# Patient Record
Sex: Female | Born: 1945
Health system: Southern US, Community
[De-identification: ages and names within clinical notes are randomized; demographics above are authoritative.]

## PROBLEM LIST (undated history)

## (undated) DIAGNOSIS — T7840XA Allergy, unspecified, initial encounter: Secondary | ICD-10-CM

## (undated) DIAGNOSIS — C4490 Unspecified malignant neoplasm of skin, unspecified: Secondary | ICD-10-CM

## (undated) DIAGNOSIS — K219 Gastro-esophageal reflux disease without esophagitis: Secondary | ICD-10-CM

## (undated) DIAGNOSIS — J449 Chronic obstructive pulmonary disease, unspecified: Secondary | ICD-10-CM

## (undated) DIAGNOSIS — K5792 Diverticulitis of intestine, part unspecified, without perforation or abscess without bleeding: Secondary | ICD-10-CM

## (undated) DIAGNOSIS — F419 Anxiety disorder, unspecified: Secondary | ICD-10-CM

## (undated) DIAGNOSIS — E785 Hyperlipidemia, unspecified: Secondary | ICD-10-CM

## (undated) DIAGNOSIS — F32A Depression, unspecified: Secondary | ICD-10-CM

## (undated) DIAGNOSIS — R5383 Other fatigue: Secondary | ICD-10-CM

## (undated) DIAGNOSIS — M545 Low back pain, unspecified: Secondary | ICD-10-CM

## (undated) DIAGNOSIS — R0600 Dyspnea, unspecified: Secondary | ICD-10-CM

## (undated) DIAGNOSIS — G4700 Insomnia, unspecified: Secondary | ICD-10-CM

## (undated) DIAGNOSIS — M1990 Unspecified osteoarthritis, unspecified site: Secondary | ICD-10-CM

## (undated) DIAGNOSIS — F329 Major depressive disorder, single episode, unspecified: Secondary | ICD-10-CM

## (undated) HISTORY — DX: Major depressive disorder, single episode, unspecified: F32.9

## (undated) HISTORY — DX: Allergy, unspecified, initial encounter: T78.40XA

## (undated) HISTORY — DX: Chronic obstructive pulmonary disease, unspecified: J44.9

## (undated) HISTORY — DX: Unspecified osteoarthritis, unspecified site: M19.90

## (undated) HISTORY — DX: Diverticulitis of intestine, part unspecified, without perforation or abscess without bleeding: K57.92

## (undated) HISTORY — DX: Unspecified malignant neoplasm of skin, unspecified: C44.90

## (undated) HISTORY — DX: Hyperlipidemia, unspecified: E78.5

## (undated) HISTORY — DX: Low back pain, unspecified: M54.50

## (undated) HISTORY — PX: SKIN CANCER EXCISION: SHX779

## (undated) HISTORY — DX: Other fatigue: R53.83

## (undated) HISTORY — DX: Low back pain: M54.5

## (undated) HISTORY — DX: Dyspnea, unspecified: R06.00

## (undated) HISTORY — DX: Anxiety disorder, unspecified: F41.9

## (undated) HISTORY — PX: BREAST CYST ASPIRATION: SHX578

## (undated) HISTORY — DX: Depression, unspecified: F32.A

## (undated) HISTORY — DX: Insomnia, unspecified: G47.00

---

## 1968-04-12 HISTORY — PX: TUBAL LIGATION: SHX77

## 1999-09-07 ENCOUNTER — Encounter: Admission: RE | Admit: 1999-09-07 | Discharge: 1999-09-07 | Payer: Self-pay | Admitting: General Surgery

## 1999-09-07 ENCOUNTER — Encounter: Payer: Self-pay | Admitting: General Surgery

## 2000-02-09 ENCOUNTER — Encounter (INDEPENDENT_AMBULATORY_CARE_PROVIDER_SITE_OTHER): Payer: Self-pay | Admitting: Specialist

## 2000-02-09 ENCOUNTER — Ambulatory Visit (HOSPITAL_COMMUNITY): Admission: RE | Admit: 2000-02-09 | Discharge: 2000-02-09 | Payer: Self-pay | Admitting: Gastroenterology

## 2000-09-07 ENCOUNTER — Encounter: Payer: Self-pay | Admitting: General Surgery

## 2000-09-07 ENCOUNTER — Encounter: Admission: RE | Admit: 2000-09-07 | Discharge: 2000-09-07 | Payer: Self-pay | Admitting: General Surgery

## 2001-09-08 ENCOUNTER — Encounter: Payer: Self-pay | Admitting: General Surgery

## 2001-09-08 ENCOUNTER — Encounter: Admission: RE | Admit: 2001-09-08 | Discharge: 2001-09-08 | Payer: Self-pay | Admitting: General Surgery

## 2002-06-25 ENCOUNTER — Ambulatory Visit (HOSPITAL_COMMUNITY): Admission: RE | Admit: 2002-06-25 | Discharge: 2002-06-25 | Payer: Self-pay | Admitting: Gastroenterology

## 2002-06-25 ENCOUNTER — Encounter (INDEPENDENT_AMBULATORY_CARE_PROVIDER_SITE_OTHER): Payer: Self-pay | Admitting: *Deleted

## 2002-09-19 ENCOUNTER — Encounter: Payer: Self-pay | Admitting: General Surgery

## 2002-09-19 ENCOUNTER — Encounter: Admission: RE | Admit: 2002-09-19 | Discharge: 2002-09-19 | Payer: Self-pay | Admitting: General Surgery

## 2003-09-20 ENCOUNTER — Encounter: Admission: RE | Admit: 2003-09-20 | Discharge: 2003-09-20 | Payer: Self-pay | Admitting: General Surgery

## 2004-04-12 DIAGNOSIS — G4700 Insomnia, unspecified: Secondary | ICD-10-CM

## 2004-04-12 HISTORY — DX: Insomnia, unspecified: G47.00

## 2004-07-26 ENCOUNTER — Emergency Department: Payer: Self-pay | Admitting: Emergency Medicine

## 2004-10-05 ENCOUNTER — Encounter: Admission: RE | Admit: 2004-10-05 | Discharge: 2004-10-05 | Payer: Self-pay | Admitting: General Surgery

## 2004-10-19 ENCOUNTER — Encounter: Admission: RE | Admit: 2004-10-19 | Discharge: 2004-10-19 | Payer: Self-pay | Admitting: Family Medicine

## 2005-04-12 DIAGNOSIS — C4490 Unspecified malignant neoplasm of skin, unspecified: Secondary | ICD-10-CM

## 2005-04-12 HISTORY — DX: Unspecified malignant neoplasm of skin, unspecified: C44.90

## 2005-10-25 ENCOUNTER — Encounter: Admission: RE | Admit: 2005-10-25 | Discharge: 2005-10-25 | Payer: Self-pay | Admitting: General Surgery

## 2006-10-31 ENCOUNTER — Encounter: Admission: RE | Admit: 2006-10-31 | Discharge: 2006-10-31 | Payer: Self-pay | Admitting: General Surgery

## 2007-06-05 ENCOUNTER — Encounter: Admission: RE | Admit: 2007-06-05 | Discharge: 2007-06-05 | Payer: Self-pay | Admitting: General Surgery

## 2008-06-07 ENCOUNTER — Encounter: Admission: RE | Admit: 2008-06-07 | Discharge: 2008-06-07 | Payer: Self-pay | Admitting: *Deleted

## 2009-06-23 ENCOUNTER — Encounter: Admission: RE | Admit: 2009-06-23 | Discharge: 2009-06-23 | Payer: Self-pay | Admitting: Obstetrics and Gynecology

## 2010-05-26 ENCOUNTER — Other Ambulatory Visit: Payer: Self-pay | Admitting: Obstetrics and Gynecology

## 2010-05-26 DIAGNOSIS — Z1239 Encounter for other screening for malignant neoplasm of breast: Secondary | ICD-10-CM

## 2010-06-29 ENCOUNTER — Ambulatory Visit
Admission: RE | Admit: 2010-06-29 | Discharge: 2010-06-29 | Disposition: A | Discharge status: Home / Self Care | Payer: Managed Care, Other (non HMO) | Source: Ambulatory Visit | Attending: Obstetrics and Gynecology | Admitting: Obstetrics and Gynecology

## 2010-06-29 DIAGNOSIS — Z1239 Encounter for other screening for malignant neoplasm of breast: Secondary | ICD-10-CM

## 2010-07-03 ENCOUNTER — Other Ambulatory Visit: Payer: Self-pay | Admitting: Gastroenterology

## 2010-08-28 NOTE — Op Note (Signed)
Kelly Weber, Kelly Weber                          ACCOUNT NO.:  [id number]   MEDICAL RECORD NO.:  [id number]                   PATIENT TYPE:  AMB   LOCATION:  ENDO                                 FACILITY:  [facility]   PHYSICIAN:  Bernette Redbird, M.D.                DATE OF BIRTH:  January 26, 1946   DATE OF PROCEDURE:  06/25/2002  DATE OF DISCHARGE:                                 OPERATIVE REPORT   PROCEDURE:  Upper endoscopy with biopsies.   ENDOSCOPIST:  Bernette Redbird, M.D.   INDICATIONS FOR PROCEDURE:  This 65 year old female has a problem with  copious secretions in the pharyngeal region, despite a high dose of  omeprazole.  It is unclear whether or not this is a manifestation of chronic  reflux disease.   FINDINGS:  Proximal short segment Barrett's esophagus.  Questionable mild  reflux laryngitis.   DESCRIPTION OF PROCEDURE:  The nature, purpose, and risks of the procedure  had been discussed with the patient and she provided a written consent.  Sedation for this procedure and the colonoscopy which followed it, totalled  fentanyl 100 mcg and Versed 10 mg IV, without arrhythmias or desaturation.  The Olympus video endoscope was passed under direct vision.  The vocal cords  were perhaps minimally erythematous in their posterior portion, and there  appeared to be some mild edema of the posterior commissure of the larynx,  but this is a subtle call.  The esophagus was readily entered.  The distal  esophagus had two short tongues, perhaps 1.0 cm in length, of erythematous  mucosa, possibly consistent with a short segment of Barrett's esophagus,  versus simply an irregular Z-line.  Biopsies were obtained of this area, to  help differentiate between those two possibilities.  The esophagus was  otherwise normal.  I saw no free reflux, no varices, infection, or  neoplasia, no rings, stricture, or hiatal hernia.  The stomach contained a clear residual which was suctioned up, and the  gastric  mucosa was unremarkable, including a retroflexed view of the cardia.  No gastritis, erosions, ulcers, polyps, or masses were seen.  The pylorus,  duodenal bulb, and second duodenum looked normal.  The scope was then  removed from the patient, who tolerated the procedure well, without apparent  complications.   IMPRESSION:  1. Possible short segment of Barrett's esophagus, biopsied.  2. Possible minimal reflux laryngitis.    PLAN:  Await pathology and biopsies.  If intestinal metaplasia is present,  continue with PPI therapy, and consider a follow-up endoscopy in a couple of  years.                                                 Bernette Redbird, M.D.    RB/MEDQ  D:  06/25/2002  T:  06/25/2002  Job:  [id number]   cc:   Benetta Spar, M.D.  [facility], [place],  [place]

## 2010-08-28 NOTE — Procedures (Signed)
[place]  Patient:    Kelly Weber, Kelly Weber                       MRN: [id number] Proc. Date: 02/09/00 Adm. Date:  [id number] Attending:  Rich Brave CC:         Rose Phi. Maple Hudson, M.D.                           Procedure Report  PROCEDURE:  Colonoscopy with polypectomy and hot biopsy.  INDICATION:  A 65 year old female with intermittent fecal incontinence with a tendency toward some loose stools and periodic urgency of defecation.  FINDINGS:  Several small to medium-size polyps, removed.  DESCRIPTION OF PROCEDURE:  The nature, purpose, and risks of the procedure have been discussed with the patient, who provided written consent.  Sedation was fentanyl 85 mcg and Versed 8.5 mg IV, without arrhythmias or desaturation.  Digital exam showed what appeared to be decreased anal sphincter tone but was otherwise normal.  The Olympus adult video colonoscope was easily advanced to the terminal ileum, applying some external abdominal compression to facilitate passage.  The quality of the prep was excellent, and it was felt that all areas were well-seen.  There were three polyps encountered on this exam.  One was removed on the way in and was measured, during pullback, as being at 70 cm from the external anal opening, probably in the distal transverse colon.  It was a 3 mm sessile, hyperplastic-appearing polyp, removed by hot biopsy technique with complete hemostasis and no evidence of excessive cautery.  The second polyp was similar in appearance, located about 32 cm from the external anal opening, and removed in the same fashion.  The third polyp was a semipedunculated, 1-1.5 cm slightly erythematous polyp, removed by snare technique, again with complete hemostasis and no evidence of excessive cautery.  It was able to be suctioned through the scope.  There was no evidence of large polyps, cancer, colitis, vascular malformations, or  diverticulosis, and retroflexion in the rectum was unremarkable.  Random mucosal biopsies were obtained from the terminal ileum and along the length of the colon during this exam.  The patient tolerated this procedure well, and there were no apparent complications.  IMPRESSION: 1. Several colon polyps removed as described above. 2. No obvious source of patients diarrhea or intermittent fecal incontinence    identified.  PLAN:  Await pathology on todays biopsies and polyp results. DD:  02/09/00 TD:  02/09/00 Job: [id number]

## 2010-08-28 NOTE — Op Note (Signed)
   [name], Kelly Weber                          ACCOUNT NO.:  [id number]   MEDICAL RECORD NO.:  [id number]                   PATIENT TYPE:  AMB   LOCATION:  ENDO                                 FACILITY:  [facility]   PHYSICIAN:  Bernette Redbird, M.D.                DATE OF BIRTH:  Jul 03, 1945   DATE OF PROCEDURE:  06/25/2002  DATE OF DISCHARGE:                                 OPERATIVE REPORT   PROCEDURE:  Colonoscopy.   INDICATIONS FOR PROCEDURE:  Followup of colonic adenomas, observed at time  of previous colonoscopy about 2 1/2 years ago.   FINDINGS:  Normal exam.   DESCRIPTION OF PROCEDURE:  The nature, purpose and risk of the procedure  were familiar to the patient from prior examination. She provided written  consent. The Olympus adjustable tension adult video colonoscope was inserted  and advanced with some looping overcome by external abdominal compression.  Without too much difficulty, I was able to reach the cecum and enter the  terminal ileum for a moderate distance and it appeared normal so pullback  was initiated.   The quality of the prep was very good and it is felt that all areas were  well seen.   This was a normal examination. No polyps, cancer, colitis, vascular  malformations or diverticular disease were observed and retroflexion of the  rectum was unremarkable. No biopsies were obtained. The patient tolerated  the procedure well and there were no apparent complications.   IMPRESSION:  Normal surveillance colonoscopy without evidence of polyp  recurrence.   PLAN:  Followup colonoscopy in five years.                                               Bernette Redbird, M.D.    RB/MEDQ  D:  06/25/2002  T:  06/25/2002  Job:  [id number]   cc:   Benetta Spar, M.D.  [facility], [place], [place]

## 2010-08-28 NOTE — Procedures (Signed)
[place]  Patient:    Kelly Weber, Kelly Weber                       MRN: [id number] Proc. Date: 02/09/00 Adm. Date:  [id number] Attending:  Rich Brave CC:         Rose Phi. Maple Hudson, M.D.   Procedure Report  PROCEDURE PERFORMED:  Colonoscopy with polypectomy and hot biopsy.  ENDOSCOPIST:  Florencia Reasons, M.D.  INDICATIONS FOR PROCEDURE:  The patient is a 65 year old female with intermittent fecal incontinence with a tendency toward some loose stools and periodic urgency of defecation.  FINDINGS:  Several small to medium-sized polyps removed.  DESCRIPTION OF PROCEDURE:  The nature, purpose and risks of the procedure had been discussed with the patient, who provided written consent.  Sedation was entanyl 85 mcg and Versed 8.5 mg IV without arrhythmias or desaturation. Digital exam showed what appeared to be decreased anal sphincter tone but was otherwise normal.   The Olympus adult video colonoscope was easily advanced quite easily to the terminal ileum, applying some external abdominal compression to facilitate passage.  The quality of the prep was excellent and it is felt that all areas were well seen.  There were three polyps encountered on this exam.  One was removed on the way in, and was measured during pullback, as being at 70 cm from the external anal opening, probably in the distal transverse colon.  It was a 3 mm sessile hyperplastic-appearing polyp removed by hot biopsy technique with complete hemostasis and no evidence of excessive cautery.  The second polyp was similar in appearance, located at about 32 cm from the external anal opening, and removed in the same fashion.  The third polyp was a semipedunculated 1 to 1.5 cm slightly erythematous polyp, removed by snare technique, again with complete hemostasis and no evidence of excessive cautery.  It was able to be suctioned through the scope.  There was no evidence of  large polyps, cancer, colitis, vascular malformations or diverticulosis and retroflexion in the rectum was unremarkable.  Random mucosal biopsies were obtained from the terminal ileum and along the length of the colon during this exam.  The patient tolerated the procedure well and there were no apparent complications.  IMPRESSION: 1. Several colon polyps removed as described above. 2. No obvious source of patients diarrhea or intermittent fecal incontinence    identified.  PLAN:  Await pathology on todays biopsies and polyp results. The patient tolerated the procedure well and there were no apparent complications.  IMPRESSION:  Normal colonoscopy.  PLAN: Consider follow-up colonoscopy in five years in view of the family history of colon cancer. DD:  02/09/00 TD:  02/09/00 Job: [id number]

## 2011-05-28 ENCOUNTER — Other Ambulatory Visit: Payer: Self-pay | Admitting: Obstetrics and Gynecology

## 2011-05-28 DIAGNOSIS — Z1231 Encounter for screening mammogram for malignant neoplasm of breast: Secondary | ICD-10-CM

## 2011-07-01 ENCOUNTER — Ambulatory Visit
Admission: RE | Admit: 2011-07-01 | Discharge: 2011-07-01 | Disposition: A | Discharge status: Home / Self Care | Payer: Managed Care, Other (non HMO) | Source: Ambulatory Visit | Attending: Obstetrics and Gynecology | Admitting: Obstetrics and Gynecology

## 2011-07-01 DIAGNOSIS — Z1231 Encounter for screening mammogram for malignant neoplasm of breast: Secondary | ICD-10-CM

## 2011-07-06 ENCOUNTER — Other Ambulatory Visit: Payer: Self-pay | Admitting: Obstetrics and Gynecology

## 2011-07-06 DIAGNOSIS — R928 Other abnormal and inconclusive findings on diagnostic imaging of breast: Secondary | ICD-10-CM

## 2011-07-07 ENCOUNTER — Emergency Department: Payer: Self-pay | Admitting: Emergency Medicine

## 2011-08-02 ENCOUNTER — Other Ambulatory Visit: Payer: Managed Care, Other (non HMO)

## 2011-08-02 ENCOUNTER — Ambulatory Visit
Admission: RE | Admit: 2011-08-02 | Discharge: 2011-08-02 | Disposition: A | Discharge status: Home / Self Care | Payer: Private Health Insurance - Indemnity | Source: Ambulatory Visit | Attending: Obstetrics and Gynecology | Admitting: Obstetrics and Gynecology

## 2011-08-02 DIAGNOSIS — R928 Other abnormal and inconclusive findings on diagnostic imaging of breast: Secondary | ICD-10-CM

## 2012-06-15 ENCOUNTER — Other Ambulatory Visit: Payer: Self-pay

## 2012-06-15 DIAGNOSIS — Z1231 Encounter for screening mammogram for malignant neoplasm of breast: Secondary | ICD-10-CM

## 2012-07-11 ENCOUNTER — Other Ambulatory Visit: Payer: Self-pay | Admitting: Obstetrics and Gynecology

## 2012-07-11 ENCOUNTER — Ambulatory Visit
Admission: RE | Admit: 2012-07-11 | Discharge: 2012-07-11 | Disposition: A | Discharge status: Home / Self Care | Payer: Medicare Other | Source: Ambulatory Visit

## 2012-07-11 DIAGNOSIS — R928 Other abnormal and inconclusive findings on diagnostic imaging of breast: Secondary | ICD-10-CM

## 2012-07-11 DIAGNOSIS — Z1231 Encounter for screening mammogram for malignant neoplasm of breast: Secondary | ICD-10-CM

## 2012-07-21 ENCOUNTER — Ambulatory Visit
Admission: RE | Admit: 2012-07-21 | Discharge: 2012-07-21 | Disposition: A | Discharge status: Home / Self Care | Payer: Medicare Other | Source: Ambulatory Visit | Attending: Obstetrics and Gynecology | Admitting: Obstetrics and Gynecology

## 2012-07-21 DIAGNOSIS — R928 Other abnormal and inconclusive findings on diagnostic imaging of breast: Secondary | ICD-10-CM

## 2012-10-09 ENCOUNTER — Other Ambulatory Visit: Payer: Self-pay | Admitting: Family Medicine

## 2012-10-09 LAB — CBC WITH DIFFERENTIAL/PLATELET
Eosinophil %: 1.9 %
HCT: 38.5 % (ref 35.0–47.0)
HGB: 13.3 g/dL (ref 12.0–16.0)
Lymphocyte #: 2.6 10*3/uL (ref 1.0–3.6)
MCH: 29.2 pg (ref 26.0–34.0)
MCV: 85 fL (ref 80–100)
Neutrophil #: 3.9 10*3/uL (ref 1.4–6.5)
RDW: 13.7 % (ref 11.5–14.5)

## 2012-10-09 LAB — COMPREHENSIVE METABOLIC PANEL
Albumin: 3.6 g/dL (ref 3.4–5.0)
Anion Gap: 5 — ABNORMAL LOW (ref 7–16)
Chloride: 106 mmol/L (ref 98–107)
Co2: 28 mmol/L (ref 21–32)
Creatinine: 0.87 mg/dL (ref 0.60–1.30)
Glucose: 95 mg/dL (ref 65–99)
Osmolality: 277 (ref 275–301)
Potassium: 4.3 mmol/L (ref 3.5–5.1)
SGPT (ALT): 17 U/L (ref 12–78)
Sodium: 139 mmol/L (ref 136–145)

## 2012-10-16 ENCOUNTER — Ambulatory Visit: Payer: Self-pay | Admitting: Family Medicine

## 2013-08-02 ENCOUNTER — Other Ambulatory Visit: Payer: Self-pay

## 2013-08-02 DIAGNOSIS — Z1231 Encounter for screening mammogram for malignant neoplasm of breast: Secondary | ICD-10-CM

## 2013-08-22 ENCOUNTER — Encounter (INDEPENDENT_AMBULATORY_CARE_PROVIDER_SITE_OTHER): Payer: Self-pay

## 2013-08-22 ENCOUNTER — Ambulatory Visit
Admission: RE | Admit: 2013-08-22 | Discharge: 2013-08-22 | Disposition: A | Discharge status: Home / Self Care | Payer: Commercial Managed Care - HMO | Source: Ambulatory Visit

## 2013-08-22 DIAGNOSIS — Z1231 Encounter for screening mammogram for malignant neoplasm of breast: Secondary | ICD-10-CM

## 2014-01-18 ENCOUNTER — Emergency Department: Payer: Self-pay | Admitting: Student

## 2014-01-18 LAB — CBC
HCT: 39.7 % (ref 35.0–47.0)
HGB: 12.9 g/dL (ref 12.0–16.0)
MCH: 28.1 pg (ref 26.0–34.0)
MCHC: 32.4 g/dL (ref 32.0–36.0)
MCV: 87 fL (ref 80–100)
Platelet: 248 10*3/uL (ref 150–440)
RBC: 4.58 10*6/uL (ref 3.80–5.20)
RDW: 14.7 % — AB (ref 11.5–14.5)
WBC: 13 10*3/uL — ABNORMAL HIGH (ref 3.6–11.0)

## 2014-01-18 LAB — COMPREHENSIVE METABOLIC PANEL
ALBUMIN: 3.3 g/dL — AB (ref 3.4–5.0)
ANION GAP: 7 (ref 7–16)
Alkaline Phosphatase: 86 U/L
BUN: 12 mg/dL (ref 7–18)
Bilirubin,Total: 0.3 mg/dL (ref 0.2–1.0)
CALCIUM: 7.9 mg/dL — AB (ref 8.5–10.1)
CO2: 28 mmol/L (ref 21–32)
CREATININE: 0.8 mg/dL (ref 0.60–1.30)
Chloride: 108 mmol/L — ABNORMAL HIGH (ref 98–107)
Glucose: 103 mg/dL — ABNORMAL HIGH (ref 65–99)
OSMOLALITY: 285 (ref 275–301)
POTASSIUM: 3.5 mmol/L (ref 3.5–5.1)
SGOT(AST): 26 U/L (ref 15–37)
SGPT (ALT): 20 U/L
Sodium: 143 mmol/L (ref 136–145)
TOTAL PROTEIN: 6.3 g/dL — AB (ref 6.4–8.2)

## 2014-01-18 LAB — D-DIMER(ARMC): D-DIMER: 1007 ng/mL

## 2014-01-18 LAB — PROTIME-INR
INR: 1
Prothrombin Time: 13 secs (ref 11.5–14.7)

## 2014-01-18 LAB — TROPONIN I: Troponin-I: 0.05 ng/mL

## 2014-01-18 LAB — APTT: ACTIVATED PTT: 23.8 s (ref 23.6–35.9)

## 2014-01-19 LAB — TROPONIN I: TROPONIN-I: 0.06 ng/mL — AB

## 2014-02-14 ENCOUNTER — Ambulatory Visit: Payer: Self-pay

## 2014-02-14 LAB — CBC WITH DIFFERENTIAL/PLATELET
Basophil #: 0 10*3/uL (ref 0.0–0.1)
Basophil %: 0.5 %
EOS ABS: 0.1 10*3/uL (ref 0.0–0.7)
EOS PCT: 2.9 %
HCT: 37.1 % (ref 35.0–47.0)
HGB: 12 g/dL (ref 12.0–16.0)
LYMPHS ABS: 2.1 10*3/uL (ref 1.0–3.6)
Lymphocyte %: 41.3 %
MCH: 28 pg (ref 26.0–34.0)
MCHC: 32.2 g/dL (ref 32.0–36.0)
MCV: 87 fL (ref 80–100)
Monocyte #: 0.4 x10 3/mm (ref 0.2–0.9)
Monocyte %: 7 %
NEUTROS ABS: 2.4 10*3/uL (ref 1.4–6.5)
Neutrophil %: 48.3 %
Platelet: 256 10*3/uL (ref 150–440)
RBC: 4.26 10*6/uL (ref 3.80–5.20)
RDW: 14.4 % (ref 11.5–14.5)
WBC: 5 10*3/uL (ref 3.6–11.0)

## 2014-02-14 LAB — COMPREHENSIVE METABOLIC PANEL
ALT: 24 U/L
AST: 20 U/L (ref 15–37)
Albumin: 3.3 g/dL — ABNORMAL LOW (ref 3.4–5.0)
Alkaline Phosphatase: 92 U/L
Anion Gap: 5 — ABNORMAL LOW (ref 7–16)
BILIRUBIN TOTAL: 0.5 mg/dL (ref 0.2–1.0)
BUN: 13 mg/dL (ref 7–18)
CO2: 29 mmol/L (ref 21–32)
CREATININE: 0.89 mg/dL (ref 0.60–1.30)
Calcium, Total: 8.1 mg/dL — ABNORMAL LOW (ref 8.5–10.1)
Chloride: 107 mmol/L (ref 98–107)
EGFR (African American): >60
EGFR (Non-African Amer.): >60
Glucose: 88 mg/dL (ref 65–99)
OSMOLALITY: 281 (ref 275–301)
Potassium: 4.2 mmol/L (ref 3.5–5.1)
SODIUM: 141 mmol/L (ref 136–145)
Total Protein: 6.4 g/dL (ref 6.4–8.2)

## 2014-02-14 LAB — LIPID PANEL
CHOLESTEROL: 188 mg/dL (ref 0–200)
HDL Cholesterol: 55 mg/dL (ref 40–60)
LDL CHOLESTEROL, CALC: 113 mg/dL — AB (ref 0–100)
Triglycerides: 101 mg/dL (ref 0–200)
VLDL CHOLESTEROL, CALC: 20 mg/dL (ref 5–40)

## 2014-07-25 ENCOUNTER — Other Ambulatory Visit: Payer: Self-pay

## 2014-07-25 DIAGNOSIS — Z1231 Encounter for screening mammogram for malignant neoplasm of breast: Secondary | ICD-10-CM

## 2014-08-03 NOTE — Consult Note (Signed)
Brief Consult Note: Diagnosis: Allergic reaction to flu vaccine.   Patient was seen by consultant.   Consult note dictated.   Orders entered.   Discussed with Attending MD.   Comments: laryngeal swelling has improved with benadryl.  Recommend continued use at home.  Little suspicion that barely elevated tropoin indicates myocarial injury.  Cont AZM for pneumonia.  Electronic Signatures: Harrie Foreman (MD)  (Signed 10-Oct-15 05:15)  Authored: Brief Consult Note   Last Updated: 10-Oct-15 05:15 by Harrie Foreman (MD)

## 2014-08-03 NOTE — Consult Note (Signed)
PATIENT NAME:  Kelly Weber, Kelly Weber MR#:  [id number] DATE OF BIRTH:  [birth year]  DATE OF CONSULTATION:  01/19/2014  REFERRING PHYSICIAN:  Lisa Roca, M.D.  CONSULTING PHYSICIAN:  Norva Riffle. Marcille Blanco, MD  PRIMARY CARE DOCTOR: Larene Beach, MD  REASON FOR CONSULT: Chest pain and dyspnea.   HISTORY OF PRESENT ILLNESS: This is a 69 year old Caucasian female who presents to the Emergency Department via EMS for chest pain and shortness of breath. The patient states that it started nearly immediately following a visit to her primary care doctor's office where she received a flu vaccine. This is the second time in her life in which she has received flu vaccination. She states that she began breathing rapidly and felt like she could not catch her breath. At that time she had some right-sided chest pain that was sharp, but also radiated some to the left side of her chest more laterally under her arms than substernally, but was vaguely throughout her chest. The episode scared her so much that she called 911 at which time she was brought to the Emergency Department for evaluation. Her chest pain was non-exertional and once it spread to the aforementioned areas, no longer radiated. She had no preceding upper respiratory infection symptoms. Chest x-ray initially showed a right lower lobe pneumonia. She had no acute EKG changes and her initial troponin was negative but was followed by a 1/100th increase in troponin that bumped it into the positive range. This was the original cause for concern, which prompted the ED to call for admission and consultation.   REVIEW OF SYSTEMS:  CONSTITUTIONAL: The patient denies fever or weakness.  EYES: Denies blurred vision or diplopia.  EARS, NOSE AND THROAT: Denies tinnitus or sore throat.  RESPIRATORY: Admits to recent cough and shortness of breath with this episode of respiratory difficulty.  CARDIOVASCULAR: Admits to chest pain as described above, but denies palpitations,  orthopnea, or dyspnea on exertion.  GASTROINTESTINAL: Denies nausea, vomiting, diarrhea or abdominal pain.  GENITOURINARY: Denies dysuria, increased frequency or hesitancy.  ENDOCRINE: Denies polyuria, polydipsia.  HEMATOLOGIC AND LYMPHATIC: Denies easy bleeding or bruising. INTEGUMENTARY: Denies rashes or lesions.   MUSCULOSKELETAL: Denies muscle aches or joint pains.  NEUROLOGIC: Denies numbness in her extremities or dysarthria.  PSYCHIATRIC: The patient denies depression or suicidal ideation   PAST MEDICAL HISTORY: Anxiety, depression.   PAST SURGICAL HISTORY: Ganglion cyst removal from her feet and bilateral tubal ligation.   FAMILY HISTORY: Mother deceased of primary brain cancer. The patient also has an aunt who is deceased of breast cancer. There is also a maternal aunt with diabetes.   SOCIAL HISTORY: The patient lives with her husband. She smokes nearly a pack a day for the last 45 years. She does not drink or do any illicit drugs.   MEDICATIONS:  1. Percocet 10/325 mg tablet 1 tab every 6 hours as needed for pain.  2. Ciloxan 0.3% ophthalmic solution, instructions 2 drops to each eye every 4 hours.  3. Azithromycin 250 mg 1 tab p.o. daily.   ALLERGIES: PENICILLIN AND TRAMADOL.  PERTINENT LABORATORY RESULTS AND RADIOGRAPHIC FINDINGS: Glucose is 103, BUN 12, creatinine 0.8. Sodium is 143, potassium 3.5, chloride 108, CO2 is 28, calcium 7.9, total serum protein 6.3, albumin is 3.3, alkaline phosphatase 86, AST 26, ALT 20, troponin was initially 0.05 and then rose to 0.06. White blood cell count 13, hemoglobin 12.9, hematocrit 39.7. Chest x-ray shows underlying emphysema or chronic inflammatory lung change. There is no frank edema or consolidation.  No appreciable adenopathy.   PHYSICAL EXAMINATION:  VITAL SIGNS: Temperature is 97.5, pulse 90, respirations 20, blood pressure 140/84, pulse oximetry 95% on room air.  GENERAL: The patient is initially very somnolent after receiving a  dose of Ativan. She is in no apparent distress.  HEENT: Normocephalic, atraumatic. Pupils equal, round, and reactive to light and accommodation. Extraocular movements are intact as the patient is sleeping. You can see the flapping of her lips, which appeared to be swollen. Mucous membranes are moist.  NECK: Trachea is midline. No adenopathy.  CHEST: Symmetric, atraumatic.  CARDIOVASCULAR: Regular rate and rhythm. Normal S1, S2. No rubs, clicks, or murmurs appreciated.  LUNGS: Clear to auscultation bilaterally. Normal effort and excursion.  ABDOMEN: Positive bowel sounds. Soft, nontender, nondistended. No hepatosplenomegaly.  GENITOURINARY: Deferred.  MUSCULOSKELETAL: The patient moves all 4 extremities equally. She has 5/5 strength in upper and lower extremities bilaterally.  SKIN: No rashes or lesions.  NEUROLOGIC: Cranial nerves II through XII are grossly intact. Mood is normal. Affect is congruent.   ASSESSMENT AND PLAN: This is a 69 year old female who appears to have an allergic reaction due to a flu vaccine. She also has a small pneumonia and chronic obstructive lung disease.   1. Allergic reaction. The patient appears to have had an allergic reaction to her flu vaccine. This may be due to sensitivity to egg albumin which, of course, is the substate in which  influenza viron is replicated. She may also have an allergy to the stabilizing agents in the vaccine as well. Thankfully she has not had any significant airway compromise at this time. Her triage complaint indeed was anxiety as I am sure the feeling of swelling in her oropharynx was disconcerting. Subsequent somnolence afterwards was due to an anxiolytic and not any form of decreased cerebral blood flow or hypoxia than one might expect with a heart attack. In fact, the patient is quite comfortable on room air despite her pneumonia.  2. Elevated cardiac enzymes. Elevation in the enzymes at this time is negligible. The patient does not have a  history of coronary artery disease and the pain is quite atypical for classic angina. She does have 2 risk factors in her age and her smoking status, but I have encouraged her to follow up with her cardiologist as an outpatient for more provocative testing if her physician deems necessary.  3. Pnemonia.  Community acquired.  Treatment per Emergency Department physician.  CODE STATUS: The patient is not going to be admitted to the hospital at this time, but she would like to make a clear she will be a DO NOT RESUSCITATE. I have encouraged the patient to fill out advanced directives for this wish.   TIME SPENT ON PATIENT CARE AND REVIEW OF THE PATIENT'S CHART: Approximately 40 minutes.     ____________________________ Norva Riffle. Marcille Blanco, MD msd:JT D: 01/19/2014 05:12:18 ET T: 01/19/2014 06:20:20 ET JOB#: [id number]  cc: Norva Riffle. Marcille Blanco, MD, <Dictator> Norva Riffle Irelyn Perfecto MD ELECTRONICALLY SIGNED 01/20/2014 0:18

## 2014-08-12 ENCOUNTER — Other Ambulatory Visit: Payer: Self-pay | Admitting: Family Medicine

## 2014-08-12 ENCOUNTER — Ambulatory Visit
Admission: RE | Admit: 2014-08-12 | Discharge: 2014-08-12 | Disposition: A | Discharge status: Home / Self Care | Payer: Commercial Managed Care - HMO | Source: Ambulatory Visit | Attending: Family Medicine | Admitting: Family Medicine

## 2014-08-12 DIAGNOSIS — M25562 Pain in left knee: Secondary | ICD-10-CM

## 2014-08-12 DIAGNOSIS — M85862 Other specified disorders of bone density and structure, left lower leg: Secondary | ICD-10-CM | POA: Diagnosis not present

## 2014-08-26 ENCOUNTER — Ambulatory Visit
Admission: RE | Admit: 2014-08-26 | Discharge: 2014-08-26 | Disposition: A | Discharge status: Home / Self Care | Payer: Commercial Managed Care - HMO | Source: Ambulatory Visit

## 2014-08-26 DIAGNOSIS — Z1231 Encounter for screening mammogram for malignant neoplasm of breast: Secondary | ICD-10-CM

## 2014-09-23 ENCOUNTER — Encounter: Payer: Self-pay | Admitting: *Deleted

## 2014-09-23 ENCOUNTER — Encounter: Payer: Self-pay | Admitting: Family Medicine

## 2014-09-23 ENCOUNTER — Other Ambulatory Visit: Payer: Self-pay

## 2014-09-23 ENCOUNTER — Telehealth: Payer: Self-pay

## 2014-09-23 ENCOUNTER — Ambulatory Visit (INDEPENDENT_AMBULATORY_CARE_PROVIDER_SITE_OTHER): Payer: Commercial Managed Care - HMO | Admitting: Family Medicine

## 2014-09-23 VITALS — BP 121/75 | HR 67 | Temp 97.7°F | Resp 16 | Ht 65.0 in | Wt 212.8 lb

## 2014-09-23 DIAGNOSIS — M545 Low back pain, unspecified: Secondary | ICD-10-CM

## 2014-09-23 DIAGNOSIS — R1032 Left lower quadrant pain: Secondary | ICD-10-CM

## 2014-09-23 DIAGNOSIS — R040 Epistaxis: Secondary | ICD-10-CM | POA: Insufficient documentation

## 2014-09-23 DIAGNOSIS — L820 Inflamed seborrheic keratosis: Secondary | ICD-10-CM | POA: Diagnosis not present

## 2014-09-23 DIAGNOSIS — F17200 Nicotine dependence, unspecified, uncomplicated: Secondary | ICD-10-CM | POA: Insufficient documentation

## 2014-09-23 DIAGNOSIS — L709 Acne, unspecified: Secondary | ICD-10-CM | POA: Insufficient documentation

## 2014-09-23 DIAGNOSIS — R3129 Other microscopic hematuria: Secondary | ICD-10-CM

## 2014-09-23 DIAGNOSIS — G4701 Insomnia due to medical condition: Secondary | ICD-10-CM | POA: Insufficient documentation

## 2014-09-23 DIAGNOSIS — Z85828 Personal history of other malignant neoplasm of skin: Secondary | ICD-10-CM | POA: Insufficient documentation

## 2014-09-23 DIAGNOSIS — R208 Other disturbances of skin sensation: Secondary | ICD-10-CM | POA: Insufficient documentation

## 2014-09-23 DIAGNOSIS — R1030 Lower abdominal pain, unspecified: Secondary | ICD-10-CM

## 2014-09-23 DIAGNOSIS — F3342 Major depressive disorder, recurrent, in full remission: Secondary | ICD-10-CM | POA: Insufficient documentation

## 2014-09-23 DIAGNOSIS — R312 Other microscopic hematuria: Secondary | ICD-10-CM | POA: Diagnosis not present

## 2014-09-23 DIAGNOSIS — M79602 Pain in left arm: Secondary | ICD-10-CM

## 2014-09-23 DIAGNOSIS — M1990 Unspecified osteoarthritis, unspecified site: Secondary | ICD-10-CM | POA: Insufficient documentation

## 2014-09-23 DIAGNOSIS — S46319A Strain of muscle, fascia and tendon of triceps, unspecified arm, initial encounter: Secondary | ICD-10-CM | POA: Insufficient documentation

## 2014-09-23 DIAGNOSIS — M25569 Pain in unspecified knee: Secondary | ICD-10-CM | POA: Insufficient documentation

## 2014-09-23 DIAGNOSIS — L989 Disorder of the skin and subcutaneous tissue, unspecified: Secondary | ICD-10-CM

## 2014-09-23 LAB — POCT URINALYSIS DIPSTICK
Bilirubin, UA: NEGATIVE
Glucose, UA: NEGATIVE
KETONES UA: NEGATIVE
Nitrite, UA: NEGATIVE
Protein, UA: NEGATIVE
RBC UA: POSITIVE
SPEC GRAV UA: 1.015
Urobilinogen, UA: 0.2
pH, UA: 5

## 2014-09-23 MED ORDER — MELOXICAM 15 MG PO TABS: 15.0000 mg | ORAL_TABLET | Freq: Every day | ORAL | Status: DC

## 2014-09-23 NOTE — Addendum Note (Signed)
Addended by: Phillip Heal, DEMETRICE A on: 09/23/2014 01:57 PM   Modules accepted: Orders

## 2014-09-23 NOTE — Telephone Encounter (Signed)
  Pt has been schedule with  Dr.Dasher at [facility] on Wednesday at 10/23/2014@1 :00.

## 2014-09-23 NOTE — Progress Notes (Signed)
Name: Kelly Weber   MRN: [id number]    DOB: 10-26-1945   Date:09/23/2014       Progress Note  Subjective  Chief Complaint  Chief Complaint  Patient presents with  . Urinary Tract Infection    Back/Groin pain  . Arm Pain    Left arm x 8 weeks  . Skin Problem    Tag on RUQ Abdomen.    HPI  L. Arm pain for > 8 weeks, now localized in L. Shoulder.  Pain with all ROM, but worse with post. movement.   C/o L. Groin pain and foul-smelling urine.  Treated for UTI 1 month ago.  No growth on urine culture at that time. No problem-specific assessment & plan notes found for this encounter.   Past Medical History  Diagnosis Date  . Depression   . Insomnia 2006  . Arthritis     History  Substance Use Topics  . Smoking status: Former Smoker -- 1.00 packs/day    Types: Cigarettes    Quit date: 01/19/2014  . Smokeless tobacco: Never Used  . Alcohol Use: No     Current outpatient prescriptions:  .  aspirin 81 MG chewable tablet, Chew by mouth., Disp: , Rfl:  .  escitalopram (LEXAPRO) 20 MG tablet, Take by mouth., Disp: , Rfl:  .  naproxen (NAPROSYN) 375 MG tablet, Take by mouth., Disp: , Rfl:  .  zolpidem (AMBIEN) 10 MG tablet, Take by mouth., Disp: , Rfl:  .  meloxicam (MOBIC) 15 MG tablet, Take 1 tablet (15 mg total) by mouth daily., Disp: 30 tablet, Rfl: 0 .  Multiple Vitamin (MULTI-VITAMINS) TABS, Take by mouth., Disp: , Rfl:  .  nitrofurantoin, macrocrystal-monohydrate, (MACROBID) 100 MG capsule, take 1 capsule by mouth twice a day for 7 days, Disp: , Rfl: 0  Allergies  Allergen Reactions  . Penicillins Other (See Comments)    Review of Systems  Constitutional: Negative for fever, chills, weight loss and malaise/fatigue.  Respiratory: Negative.  Negative for cough, hemoptysis, shortness of breath and wheezing.   Cardiovascular: Negative.  Negative for chest pain, palpitations, orthopnea and leg swelling.  Gastrointestinal: Negative.  Negative for abdominal pain.   Genitourinary:       Foul-smelling urine.  Pain in L/. groin  Musculoskeletal: Positive for joint pain (L. shoulder).  Skin:       Skin tag on R. Side of abd.  Neurological: Negative.  Negative for tingling, sensory change, focal weakness and weakness.     Objective  Filed Vitals:   09/23/14 1033  BP: 121/75  Pulse: 67  Temp: 97.7 F (36.5 C)  Resp: 16  Height: 5\' 5"  (1.651 m)  Weight: 212 lb 12.8 oz (96.525 kg)     Physical Exam  Constitutional: She is oriented to person, place, and time. No distress.  Cardiovascular: Normal rate, regular rhythm, normal heart sounds and intact distal pulses.  Exam reveals no gallop and no friction rub.   No murmur heard. Pulmonary/Chest: Effort normal and breath sounds normal. No respiratory distress. She has no wheezes.  Abdominal: Soft. Bowel sounds are normal. She exhibits no mass. There is no tenderness.  Musculoskeletal:  Pain to palp. And with ROM of L. Shoulder.  Pain is ant. And into L. Bicep and tricep areas with ROM.  Post. Movement severely limited by pain.  L. Groin with sl. Tenderness to palp along ischium and muscular attachment area. ROM of hip is wnl without pain.  Neurological: She is alert and oriented  to person, place, and time.  Skin:  seborheic keratosis of L. Lat abd that is mildly inflammed.  Vitals reviewed.     Recent Results (from the past 2160 hour(s))  POCT Urinalysis Dipstick     Status: Abnormal   Collection Time: 09/23/14 10:51 AM  Result Value Ref Range   Color, UA yellow    Clarity, UA cloudy    Glucose, UA neg    Bilirubin, UA neg    Ketones, UA neg    Spec Grav, UA 1.015    Blood, UA pos     Comment: 1 +   pH, UA 5.0    Protein, UA neg    Urobilinogen, UA 0.2    Nitrite, UA neg    Leukocytes, UA Trace      Assessment & Pla   1. Bilateral low back pain without sciatica  - CULTURE, URINE COMPREHENSIVE - POCT Urinalysis Dipstick - Ambulatory referral to Urology  2. Left arm  pain  - Ambulatory referral to Orthopedic Surgery - meloxicam (MOBIC) 15 MG tablet; Take 1 tablet (15 mg total) by mouth daily.  Dispense: 30 tablet; Refill: 0  3. Lt groin pain  - Ambulatory referral to Orthopedic Surgery  4. Hematuria, microscopic  - Ambulatory referral to Urology  5. Seborrheic keratoses, inflamed  - Ambulatory referral to Dermatology

## 2014-09-24 ENCOUNTER — Telehealth: Payer: Self-pay | Admitting: Family Medicine

## 2014-09-24 ENCOUNTER — Telehealth: Payer: Self-pay

## 2014-09-24 NOTE — Telephone Encounter (Signed)
Pt was concerned about her urology, dermatology and other specialist appointment  which Roselyn Reef is working on and as per Roselyn Reef instruct the pt that they will call once appointment will made. Thanks [employer]

## 2014-09-24 NOTE — Telephone Encounter (Signed)
Left detailed message about appt at [facility] Appt. [facility]

## 2014-09-24 NOTE — Telephone Encounter (Signed)
Pt called concerning appointment scheduling.

## 2014-09-24 NOTE — Telephone Encounter (Signed)
Referral done to skin Center.[facility]

## 2014-09-25 LAB — CULTURE, URINE COMPREHENSIVE

## 2014-09-25 LAB — SPECIMEN STATUS REPORT

## 2014-10-01 ENCOUNTER — Ambulatory Visit: Payer: Self-pay | Admitting: General Surgery

## 2014-10-02 ENCOUNTER — Telehealth: Payer: Self-pay | Admitting: Family Medicine

## 2014-10-02 NOTE — Telephone Encounter (Signed)
Spoke to [place] from [place] who had a question concerning provider referral.

## 2014-10-10 ENCOUNTER — Ambulatory Visit (INDEPENDENT_AMBULATORY_CARE_PROVIDER_SITE_OTHER): Payer: Commercial Managed Care - HMO | Admitting: Urology

## 2014-10-10 VITALS — BP 107/75 | HR 76 | Ht 60.0 in | Wt 211.1 lb

## 2014-10-10 DIAGNOSIS — R3129 Other microscopic hematuria: Secondary | ICD-10-CM

## 2014-10-10 DIAGNOSIS — N390 Urinary tract infection, site not specified: Secondary | ICD-10-CM

## 2014-10-10 DIAGNOSIS — R312 Other microscopic hematuria: Secondary | ICD-10-CM | POA: Diagnosis not present

## 2014-10-10 LAB — URINALYSIS, COMPLETE
BILIRUBIN UA: NEGATIVE
Glucose, UA: NEGATIVE
Nitrite, UA: NEGATIVE
PH UA: 5 (ref 5.0–7.5)
Specific Gravity, UA: 1.025 (ref 1.005–1.030)
UUROB: 0.2 mg/dL (ref 0.2–1.0)

## 2014-10-10 LAB — MICROSCOPIC EXAMINATION

## 2014-10-10 MED ORDER — LIDOCAINE HCL 2 % EX GEL
1.0000 "application " | Freq: Once | CUTANEOUS | Status: AC
  Administered 2014-10-10: 1 via URETHRAL

## 2014-10-10 MED ORDER — CIPROFLOXACIN HCL 500 MG PO TABS
500.0000 mg | ORAL_TABLET | Freq: Once | ORAL | Status: AC
  Administered 2014-10-10: 500 mg via ORAL

## 2014-10-10 NOTE — Progress Notes (Signed)
I have been asked to see the patient by Dr. Larene Beach, MD, for evaluation and management of microscopic hematuria and recurrent urinary tract infections.  History of present illness: 32F presents today for management of lower urinary tract symptoms.  She was seen by her PCP for groin pain and foul smelling urine.  She had been treated for a urinary tract infection a month prior.  Her urine culture was negative.  Her urine analysis was positive for blood, no microscopic test was performed.   The patient states that she has had 2 urinalyses from her primary care provider that demonstrated microscopic hematuria. The patient denies having any associated voiding symptoms including worsening frequency, urgency, or incontinence. The patient denies any dysuria. She denies any flank pain. She is not having history of recurrent urinary tract infections, stones, or family history of GU malignancies. She does state that she has foul-smelling urine that she is concerned about.  Review of systems: A 12 point comprehensive review of systems was obtained and is negative unless otherwise stated in the history of present illness.  Patient Active Problem List   Diagnosis Date Noted  . Acute cystitis 09/23/2014  . Gonalgia 09/23/2014  . Arthritis 09/23/2014  . Clinical depression 09/23/2014  . Dysesthesia 09/23/2014  . Bleeding from the nose 09/23/2014  . Insomnia due to medical condition 09/23/2014  . Adult BMI 30+ 09/23/2014  . Acne 09/23/2014  . H/O malignant neoplasm of skin 09/23/2014  . Compulsive tobacco user syndrome 09/23/2014  . Sprain, tricep 09/23/2014  . Left arm pain 09/23/2014  . Lt groin pain 09/23/2014  . Hematuria, microscopic 09/23/2014  . Seborrheic keratoses, inflamed 09/23/2014    Current Outpatient Prescriptions on File Prior to Visit  Medication Sig Dispense Refill  . aspirin 81 MG chewable tablet Chew by mouth.    . escitalopram (LEXAPRO) 20 MG tablet Take by mouth.    .  meloxicam (MOBIC) 15 MG tablet Take 1 tablet (15 mg total) by mouth daily. 30 tablet 0  . Multiple Vitamin (MULTI-VITAMINS) TABS Take by mouth.    . naproxen (NAPROSYN) 375 MG tablet Take by mouth.    . nitrofurantoin, macrocrystal-monohydrate, (MACROBID) 100 MG capsule take 1 capsule by mouth twice a day for 7 days  0  . zolpidem (AMBIEN) 10 MG tablet Take by mouth.     No current facility-administered medications on file prior to visit.    Past Medical History  Diagnosis Date  . Depression   . Insomnia 2006  . Arthritis     Past Surgical History  Procedure Laterality Date  . Laparoscopic bilateral salpingectomy Bilateral     History  Substance Use Topics  . Smoking status: Former Smoker -- 1.00 packs/day    Types: Cigarettes    Quit date: 01/19/2014  . Smokeless tobacco: Never Used  . Alcohol Use: No    Family History  Problem Relation Age of Onset  . Cancer Mother     PE: There were no vitals filed for this visit. Patient appears to be in no acute distress  patient is alert and oriented x3 Atraumatic normocephalic head No cervical or supraclavicular lymphadenopathy appreciated No increased work of breathing, no audible wheezes/rhonchi Regular sinus rhythm/rate Abdomen is soft, nontender, nondistended, no CVA or suprapubic tenderness Pelvic exam demonstrated an atrophic introitus with friable mucosa and a slight prolapse of her bladder. Urethral orifice was normal. Lower extremities are symmetric without appreciable edema Grossly neurologically intact No identifiable skin lesions  No results for  input(s): WBC, HGB, HCT in the last 72 hours. No results for input(s): NA, K, CL, CO2, GLUCOSE, BUN, CREATININE, CALCIUM in the last 72 hours. No results for input(s): LABPT, INR in the last 72 hours. No results for input(s): LABURIN in the last 72 hours. Results for orders placed or performed in visit on 09/23/14  CULTURE, URINE COMPREHENSIVE     Status: None    Collection Time: 09/23/14 12:00 AM  Result Value Ref Range Status   Urine Culture, Comprehensive Final report  Final   Result 1 Comment  Final    Comment: Mixed urogenital flora 10,000-25,000 colony forming units per mL    Procedure: The patient underwent cystourethroscopy today in clinic for the indication of microscopic hematuria. After timeout was held and the patient had been prepped and draped in the routine sterile fashion a 16 French flexible cystoscope was gently passed through the patient's urethra and into the bladder. The bladder was then filled up slowly and a visual inspection of the bladder demonstrated no evidence of tumor, stones, stitches, or mucosal abnormalities. The ureteral orifices were in orthotopic position. The urethra was normal appearing. This was a normal cystoscopic evaluation.  Imaging: none  Imp: The patient has microscopic hematuria and possible urine. Her lower urinary tract evaluation was normal. Recommendations: Given the patient's smoking history, I recommended that we complete a hematuria protocol CT scan to thoroughly evaluate the bleeding urinary tract. I also counseled her on the 30% negative workup rate. However, I do think it is worth pursuing. The patient will follow-up with me or one of the providers in the office after her CT scan to go over the results. For the patient's foul-smelling urine, we did discuss initiating a probiotic which may change the floor within the perineum and help with the smell.   Louis Meckel W

## 2014-10-10 NOTE — Addendum Note (Signed)
Addended by: Wilson Singer on: 10/10/2014 12:58 PM   Modules accepted: Orders

## 2014-10-17 ENCOUNTER — Ambulatory Visit
Admission: RE | Admit: 2014-10-17 | Discharge: 2014-10-17 | Disposition: A | Discharge status: Home / Self Care | Payer: Commercial Managed Care - HMO | Source: Ambulatory Visit | Attending: Urology | Admitting: Urology

## 2014-10-17 DIAGNOSIS — N390 Urinary tract infection, site not specified: Secondary | ICD-10-CM | POA: Diagnosis present

## 2014-10-17 DIAGNOSIS — I7090 Unspecified atherosclerosis: Secondary | ICD-10-CM | POA: Insufficient documentation

## 2014-10-17 DIAGNOSIS — I890 Lymphedema, not elsewhere classified: Secondary | ICD-10-CM | POA: Diagnosis not present

## 2014-10-17 DIAGNOSIS — R918 Other nonspecific abnormal finding of lung field: Secondary | ICD-10-CM | POA: Diagnosis not present

## 2014-10-17 MED ORDER — IOHEXOL 350 MG/ML SOLN
125.0000 mL | Freq: Once | INTRAVENOUS | Status: AC | PRN
  Administered 2014-10-17: 125 mL via INTRAVENOUS

## 2014-10-21 NOTE — Progress Notes (Signed)
The patient underwent a CT scan, hematuria protocol, for microscopic hematuria.  I went over the results with her over the phone.She was found to have an incidental, on her left adrenal gland which was benign nature.  She is also found to have an enlarging lymph node between her liver and stomach as well as small pulmonary nodules.  I recommended that she follow-up with her primary care doctor in regards to these findings.  I reassured her that her hematuria evaluation was negative.  This should be repeated in the next 3-5 years if it persists or she develops voiding symptoms.  Otherwise, the patient can follow up on an as-needed basis.

## 2014-10-22 ENCOUNTER — Ambulatory Visit: Payer: Commercial Managed Care - HMO | Admitting: Urology

## 2014-10-22 ENCOUNTER — Ambulatory Visit (INDEPENDENT_AMBULATORY_CARE_PROVIDER_SITE_OTHER): Payer: Self-pay | Admitting: Family Medicine

## 2014-10-22 ENCOUNTER — Encounter: Payer: Self-pay | Admitting: Family Medicine

## 2014-10-22 VITALS — BP 109/75 | HR 68 | Temp 98.1°F | Resp 16 | Ht 64.0 in | Wt 214.0 lb

## 2014-10-22 DIAGNOSIS — R918 Other nonspecific abnormal finding of lung field: Secondary | ICD-10-CM

## 2014-10-22 DIAGNOSIS — R590 Localized enlarged lymph nodes: Secondary | ICD-10-CM

## 2014-10-22 NOTE — Progress Notes (Signed)
Name: Kelly Weber   MRN: [id number]    DOB: [birth year]   Date:10/22/2014       Progress Note  Subjective  Chief Complaint  Chief Complaint  Patient presents with  . Follow-up    After Urology appointment ---enlarged lymphnode between stomach and liver and lower part of lungs    HPI  Here to discuss referral for enlarging lymphadenopathy in abd. Next to stomach, found on Abd. CT scan done by Urology.  Also some unchanged pulmonary nodules.  Past Medical History  Diagnosis Date  . Depression   . Insomnia 2006  . Arthritis   . Anxiety   . Skin cancer 2007    Basal Cell CA resected from Left middle finger    History  Substance Use Topics  . Smoking status: Former Smoker -- 1.00 packs/day    Types: Cigarettes    Quit date: 01/19/2014  . Smokeless tobacco: Never Used  . Alcohol Use: No     Current outpatient prescriptions:  .  aspirin 81 MG chewable tablet, Chew by mouth., Disp: , Rfl:  .  escitalopram (LEXAPRO) 20 MG tablet, Take by mouth., Disp: , Rfl:  .  lactobacillus acidophilus (BACID) TABS tablet, Take 2 tablets by mouth 3 (three) times daily., Disp: , Rfl:  .  meloxicam (MOBIC) 15 MG tablet, Take 1 tablet (15 mg total) by mouth daily., Disp: 30 tablet, Rfl: 0 .  Multiple Vitamin (MULTI-VITAMINS) TABS, Take by mouth., Disp: , Rfl:   Allergies  Allergen Reactions  . Penicillins Other (See Comments)    Review of Systems  Constitutional: Negative for fever, chills, weight loss and malaise/fatigue.  Respiratory: Positive for shortness of breath. Negative for cough and wheezing.   Cardiovascular: Negative for chest pain, palpitations, orthopnea and leg swelling.  Gastrointestinal: Negative for heartburn, nausea, vomiting, abdominal pain and blood in stool.  Genitourinary: Negative for dysuria, urgency and frequency.  Musculoskeletal: Negative for myalgias and joint pain.  Skin: Negative.  Negative for rash.  Neurological: Negative for dizziness and headaches.       Objective  Filed Vitals:   10/22/14 1405  BP: 109/75  Pulse: 68  Temp: 98.1 F (36.7 C)  TempSrc: Oral  Resp: 16  Height: 5\' 4"  (1.626 m)  Weight: 214 lb (97.07 kg)     Physical Exam  Constitutional: She is well-developed, well-nourished, and in no distress.  HENT:  Head: Normocephalic and atraumatic.  Neck: Normal range of motion. Neck supple. No thyromegaly present.  Cardiovascular: Normal rate, regular rhythm, normal heart sounds and intact distal pulses.  Exam reveals no gallop and no friction rub.   No murmur heard. Pulmonary/Chest: Effort normal and breath sounds normal. No respiratory distress. She has no wheezes. She has no rales.  Abdominal: Soft. Bowel sounds are normal. She exhibits distension. She exhibits no mass. There is no tenderness.  Musculoskeletal: She exhibits no edema.  Lymphadenopathy:    She has no cervical adenopathy.  No lymphadenopathy noted in neck, axillae, supraclavicular, groin regions.  Vitals reviewed.     Recent Results (from the past 2160 hour(s))  CULTURE, URINE COMPREHENSIVE     Status: None   Collection Time: 09/23/14 12:00 AM  Result Value Ref Range   Urine Culture, Comprehensive Final report    Result 1 Comment     Comment: Mixed urogenital flora 10,000-25,000 colony forming units per mL   Specimen status report     Status: None   Collection Time: 09/23/14 12:00 AM  Result Value  Ref Range   specimen status report Comment     Comment: One Specimen Identifier One Specimen Identifier The specimen received included only one patient identifier on the primary collection container.  Our laboratory accrediting agency states "All primary specimen containers must be labeled with 2 identifiers at the time of collection."   POCT Urinalysis Dipstick     Status: Abnormal   Collection Time: 09/23/14 10:51 AM  Result Value Ref Range   Color, UA yellow    Clarity, UA cloudy    Glucose, UA neg    Bilirubin, UA neg    Ketones,  UA neg    Spec Grav, UA 1.015    Blood, UA pos     Comment: 1 +   pH, UA 5.0    Protein, UA neg    Urobilinogen, UA 0.2    Nitrite, UA neg    Leukocytes, UA Trace   Urinalysis, Complete     Status: Abnormal   Collection Time: 10/10/14 11:19 AM  Result Value Ref Range   Specific Gravity, UA 1.025 1.005 - 1.030   pH, UA 5.0 5.0 - 7.5   Color, UA Yellow Yellow   Appearance Ur Cloudy (A) Clear   Leukocytes, UA 2+ (A) Negative   Protein, UA Trace (A) Negative/Trace   Glucose, UA Negative Negative   Ketones, UA Trace (A) Negative   RBC, UA Trace (A) Negative   Bilirubin, UA Negative Negative   Urobilinogen, Ur 0.2 0.2 - 1.0 mg/dL   Nitrite, UA Negative Negative   Microscopic Examination See below:   Microscopic Examination     Status: Abnormal   Collection Time: 10/10/14 11:19 AM  Result Value Ref Range   WBC, UA 6-10 (A) 0 -  5 /hpf   RBC, UA 0-2 0 -  2 /hpf   Epithelial Cells (non renal) 0-10 0 - 10 /hpf   Renal Epithel, UA 0-10 (A) None seen /hpf   Mucus, UA Present (A) Not Estab.   Bacteria, UA Few None seen/Few     Assessment & Plan  1. Lymphadenopathy, abdominal  - Ambulatory referral to Gastroenterology  2. Pulmonary nodules  -Plan repeat Chest CT scan in 3-4 mos. Unless sooner warranted by Lymph node work up.

## 2014-10-24 ENCOUNTER — Telehealth: Payer: Self-pay

## 2014-10-24 NOTE — Telephone Encounter (Signed)
Advised she is scheduled for 11/08/2014 9:45 with Herbie Baltimore Buccinis PA. Patient aware.[facility]

## 2014-10-25 ENCOUNTER — Encounter: Payer: Self-pay | Admitting: Gastroenterology

## 2014-10-25 ENCOUNTER — Telehealth: Payer: Self-pay | Admitting: Family Medicine

## 2014-10-25 NOTE — Telephone Encounter (Signed)
She wants provider change to Dr. Allen Norris and referral is done and approval done -[phone]

## 2014-10-25 NOTE — Telephone Encounter (Signed)
Roselyn Reef had set up appt with Dr. Osborn Coho PA.  She wasn't happy about seeing a PA so she wants to get an appt with another doctor.  Some one Dr. Luan Pulling suggested.

## 2014-10-25 NOTE — Telephone Encounter (Signed)
Ok-jh 

## 2014-11-01 ENCOUNTER — Telehealth: Payer: Self-pay

## 2014-11-01 NOTE — Telephone Encounter (Signed)
I spoke with patient.  She will go to ER is any worening or persistence of bleeding.-jh

## 2014-11-01 NOTE — Telephone Encounter (Signed)
Spoke to [place] at [place] and she said that he is on vacation next week and patient is already in the first slot available. I instructed patient to call and see if anyone canceled for the 2nd and 3rd next Friday. I also told her if you were not ok with her waiting that you will call her personally since I am afraid I will not get back to this given my work flow.

## 2014-11-03 ENCOUNTER — Emergency Department
Admission: EM | Admit: 2014-11-03 | Discharge: 2014-11-03 | Disposition: A | Discharge status: Home / Self Care | Payer: Commercial Managed Care - HMO | Attending: Emergency Medicine | Admitting: Emergency Medicine

## 2014-11-03 ENCOUNTER — Encounter: Payer: Self-pay | Admitting: Emergency Medicine

## 2014-11-03 DIAGNOSIS — N390 Urinary tract infection, site not specified: Secondary | ICD-10-CM | POA: Insufficient documentation

## 2014-11-03 DIAGNOSIS — Z87891 Personal history of nicotine dependence: Secondary | ICD-10-CM | POA: Insufficient documentation

## 2014-11-03 DIAGNOSIS — K625 Hemorrhage of anus and rectum: Secondary | ICD-10-CM | POA: Insufficient documentation

## 2014-11-03 DIAGNOSIS — Z880 Allergy status to penicillin: Secondary | ICD-10-CM | POA: Insufficient documentation

## 2014-11-03 DIAGNOSIS — Z79899 Other long term (current) drug therapy: Secondary | ICD-10-CM | POA: Diagnosis not present

## 2014-11-03 LAB — URINALYSIS COMPLETE WITH MICROSCOPIC (ARMC ONLY)
Bilirubin Urine: NEGATIVE
Glucose, UA: NEGATIVE mg/dL
Ketones, ur: NEGATIVE mg/dL
Nitrite: NEGATIVE
PROTEIN: NEGATIVE mg/dL
Specific Gravity, Urine: 1.011 (ref 1.005–1.030)
pH: 5 (ref 5.0–8.0)

## 2014-11-03 LAB — CBC
HCT: 37.7 % (ref 35.0–47.0)
Hemoglobin: 12.3 g/dL (ref 12.0–16.0)
MCH: 26.9 pg (ref 26.0–34.0)
MCHC: 32.5 g/dL (ref 32.0–36.0)
MCV: 82.8 fL (ref 80.0–100.0)
PLATELETS: 307 10*3/uL (ref 150–440)
RBC: 4.55 MIL/uL (ref 3.80–5.20)
RDW: 14.5 % (ref 11.5–14.5)
WBC: 7.9 10*3/uL (ref 3.6–11.0)

## 2014-11-03 LAB — COMPREHENSIVE METABOLIC PANEL
ALK PHOS: 89 U/L (ref 38–126)
ALT: 16 U/L (ref 14–54)
ANION GAP: 7 (ref 5–15)
AST: 17 U/L (ref 15–41)
Albumin: 3.7 g/dL (ref 3.5–5.0)
BILIRUBIN TOTAL: 0.4 mg/dL (ref 0.3–1.2)
BUN: 17 mg/dL (ref 6–20)
CO2: 29 mmol/L (ref 22–32)
Calcium: 8.6 mg/dL — ABNORMAL LOW (ref 8.9–10.3)
Chloride: 102 mmol/L (ref 101–111)
Creatinine, Ser: 1.04 mg/dL — ABNORMAL HIGH (ref 0.44–1.00)
GFR, EST NON AFRICAN AMERICAN: 54 mL/min — AB (ref >60)
GLUCOSE: 95 mg/dL (ref 65–99)
POTASSIUM: 4.2 mmol/L (ref 3.5–5.1)
Sodium: 138 mmol/L (ref 135–145)
TOTAL PROTEIN: 6.9 g/dL (ref 6.5–8.1)

## 2014-11-03 MED ORDER — FOSFOMYCIN TROMETHAMINE 3 G PO PACK
3.0000 g | PACK | ORAL | Status: AC
  Administered 2014-11-03: 3 g via ORAL
  Filled 2014-11-03: qty 3

## 2014-11-03 MED ORDER — ESOMEPRAZOLE MAGNESIUM 40 MG PO CPDR: 40.0000 mg | DELAYED_RELEASE_CAPSULE | Freq: Every day | ORAL | Status: DC

## 2014-11-03 MED ORDER — PANTOPRAZOLE SODIUM 40 MG PO TBEC
40.0000 mg | DELAYED_RELEASE_TABLET | Freq: Every day | ORAL | Status: DC
  Administered 2014-11-03: 40 mg via ORAL
  Filled 2014-11-03: qty 1

## 2014-11-03 MED ORDER — FOSFOMYCIN TROMETHAMINE 3 G PO PACK: 3.0000 g | PACK | ORAL | Status: DC

## 2014-11-03 NOTE — Discharge Instructions (Signed)
Bloody Stools  Please follow-up with your doctor tomorrow and gastroenterology as soon as possible. Return to the emergency room right away if you feel your bleeding increases, you have abdominal pain, develop a fever, passing large amounts of blood or clots, or other new concerns arise.  Bloody stools means there is blood in your poop (stool). It is a sign that there is a problem somewhere in the digestive system. It is important for your doctor to find the cause of your bleeding, so the problem can be treated.  HOME CARE  Only take medicine as told by your doctor.  Eat foods with fiber (prunes, bran cereals).  Drink enough fluids to keep your pee (urine) clear or pale yellow.  Sit in warm water (sitz bath) for 10 to 15 minutes as told by your doctor.  Know how to take your medicines (enemas, suppositories) if advised by your doctor.  Watch for signs that you are getting better or getting worse. GET HELP RIGHT AWAY IF:   You are not getting better.  You start to get better but then get worse again.  You have new problems.  You have severe bleeding from the place where poop comes out (rectum) that does not stop.  You throw up (vomit) blood.  You feel weak or pass out (faint).  You have a fever. MAKE SURE YOU:   Understand these instructions.  Will watch your condition.  Will get help right away if you are not doing well or get worse. Document Released: 03/17/2009 Document Revised: 06/21/2011 Document Reviewed: 08/14/2010 [facility] Patient Information 2015 [place], [place]. This information is not intended to replace advice given to you by your health care provider. Make sure you discuss any questions you have with your health care provider.  Urinary Tract Infection  You were treated with a antibiotic that requires usually only a single dose and was given in the emergency room. Should you experience symptoms such as pain with urination, fever, lower abdominal pain, or other  new concerns like nausea or vomiting please return to the ER or contact your doctor.  A urinary tract infection (UTI) can occur any place along the urinary tract. The tract includes the kidneys, ureters, bladder, and urethra. A type of germ called bacteria often causes a UTI. UTIs are often helped with antibiotic medicine.  HOME CARE   If given, take antibiotics as told by your doctor. Finish them even if you start to feel better.  Drink enough fluids to keep your pee (urine) clear or pale yellow.  Avoid tea, drinks with caffeine, and bubbly (carbonated) drinks.  Pee often. Avoid holding your pee in for a long time.  Pee before and after having sex (intercourse).  Wipe from front to back after you poop (bowel movement) if you are a woman. Use each tissue only once. GET HELP RIGHT AWAY IF:   You have back pain.  You have lower belly (abdominal) pain.  You have chills.  You feel sick to your stomach (nauseous).  You throw up (vomit).  Your burning or discomfort with peeing does not go away.  You have a fever.  Your symptoms are not better in 3 days. MAKE SURE YOU:   Understand these instructions.  Will watch your condition.  Will get help right away if you are not doing well or get worse. Document Released: 09/15/2007 Document Revised: 12/22/2011 Document Reviewed: 10/28/2011 [facility] Patient Information 2015 [place], [place]. This information is not intended to replace advice given to you  by your health care provider. Make sure you discuss any questions you have with your health care provider. ° °

## 2014-11-03 NOTE — ED Notes (Signed)
Pt. Going home with husband. 

## 2014-11-03 NOTE — ED Provider Notes (Signed)
[facility] Emergency Department Provider Note  ____________________________________________  Time seen: Approximately 4:07 PM  I have reviewed the triage vital signs and the nursing notes.   HISTORY  Chief Complaint Rectal Bleeding    HPI JAMIELYN PETRUCCI is a 69 y.o. female previous history of insomnia and some depression as well as arthritis. She reports that for about the last 2-3 weeks she has been noticing occasional blood in her stool, most noticeably when she wipes. She feels slightly tired but otherwise well. No nausea or vomiting or abdominal pain. She has not had any dark black bowel movements. She had a colonoscopy about 4 years ago and states they found a couple polyps and diverticulosis.  She's had ongoing issues with blood especially with wiping on the tissue paper, she contact her doctor on Friday who has set her up for an appointment with gastroenterology first week of August. They advised that if the bleeding continued to come to the emergency room over the weekend.   Past Medical History  Diagnosis Date  . Depression   . Insomnia 2006  . Arthritis   . Anxiety   . Skin cancer 2007    Basal Cell CA resected from Left middle finger    Patient Active Problem List   Diagnosis Date Noted  . Acute cystitis 09/23/2014  . Gonalgia 09/23/2014  . Arthritis 09/23/2014  . Clinical depression 09/23/2014  . Dysesthesia 09/23/2014  . Bleeding from the nose 09/23/2014  . Insomnia due to medical condition 09/23/2014  . Adult BMI 30+ 09/23/2014  . Acne 09/23/2014  . H/O malignant neoplasm of skin 09/23/2014  . Compulsive tobacco user syndrome 09/23/2014  . Sprain, tricep 09/23/2014  . Left arm pain 09/23/2014  . Lt groin pain 09/23/2014  . Hematuria, microscopic 09/23/2014  . Seborrheic keratoses, inflamed 09/23/2014    Past Surgical History  Procedure Laterality Date  . Tubal ligation  1970    Current Outpatient Rx  Name  Route  Sig   Dispense  Refill  . escitalopram (LEXAPRO) 20 MG tablet   Oral   Take by mouth.         . esomeprazole (NEXIUM) 40 MG capsule   Oral   Take 1 capsule (40 mg total) by mouth daily.   30 capsule   1   . lactobacillus acidophilus (BACID) TABS tablet   Oral   Take 2 tablets by mouth 3 (three) times daily.         . Multiple Vitamin (MULTI-VITAMINS) TABS   Oral   Take by mouth.           Allergies Penicillins  Family History  Problem Relation Age of Onset  . Brain cancer Mother     Social History History  Substance Use Topics  . Smoking status: Former Smoker -- 1.00 packs/day    Types: Cigarettes    Quit date: 01/19/2014  . Smokeless tobacco: Never Used  . Alcohol Use: No    Review of Systems Constitutional: No fever/chills Eyes: No visual changes. ENT: No sore throat. Cardiovascular: Denies chest pain. Respiratory: Denies shortness of breath. Gastrointestinal: No abdominal pain.  No nausea, no vomiting.  No diarrhea.  No constipation. Genitourinary: Negative for dysuria. Musculoskeletal: Negative for back pain. Skin: Negative for rash. Neurological: Negative for headaches, focal weakness or numbness.  Patient relates that she stop taking her baby aspirin and meloxicam on Wednesday and has not restarted them, I told her I believe this is a good idea and  that she should discontinue these medicines for now.  10-point ROS otherwise negative.  ____________________________________________   PHYSICAL EXAM:  VITAL SIGNS: ED Triage Vitals  Enc Vitals Group     BP 11/03/14 1343 117/62 mmHg     Pulse Rate 11/03/14 1343 72     Resp 11/03/14 1343 18     Temp 11/03/14 1343 98.3 F (36.8 C)     Temp Source 11/03/14 1343 Oral     SpO2 11/03/14 1343 92 %     Weight 11/03/14 1343 210 lb (95.255 kg)     Height 11/03/14 1343 5\' 4"  (1.626 m)     Head Cir --      Peak Flow --      Pain Score --      Pain Loc --      Pain Edu? --      Excl. in [place]? --      Constitutional: Alert and oriented. Well appearing and in no acute distress. Eyes: Conjunctivae are normal. PERRL. EOMI. Head: Atraumatic. Nose: No congestion/rhinnorhea. Mouth/Throat: Mucous membranes are moist.  Oropharynx non-erythematous. Neck: No stridor.   Cardiovascular: Normal rate, regular rhythm. Grossly normal heart sounds.  Good peripheral circulation. Respiratory: Normal respiratory effort.  No retractions. Lungs CTAB. Gastrointestinal: Soft and nontender. No distention. No abdominal bruits. No CVA tenderness. Rectal escorted by RN, Vicente Males. External hemorrhoids noted which are nontender. Rectal vault is empty, no evidence of bleeding, slight amount of brown stool removed which was heme negative. QC checked. Musculoskeletal: No lower extremity tenderness nor edema.  No joint effusions. Neurologic:  Normal speech and language. No gross focal neurologic deficits are appreciated. No gait instability. Skin:  Skin is warm, dry and intact. No rash noted. Psychiatric: Mood and affect are normal. Speech and behavior are normal.  ____________________________________________   LABS (all labs ordered are listed, but only abnormal results are displayed)  Labs Reviewed  COMPREHENSIVE METABOLIC PANEL - Abnormal; Notable for the following:    Creatinine, Ser 1.04 (*)    Calcium 8.6 (*)    GFR calc non Af Amer 54 (*)    All other components within normal limits  URINALYSIS COMPLETEWITH MICROSCOPIC ([facility] ONLY) - Abnormal; Notable for the following:    Color, Urine STRAW (*)    APPearance CLEAR (*)    Hgb urine dipstick 1+ (*)    Leukocytes, UA 3+ (*)    Bacteria, UA RARE (*)    Squamous Epithelial / LPF 0-5 (*)    All other components within normal limits  CBC   ____________________________________________  EKG   ____________________________________________  RADIOLOGY   ____________________________________________   PROCEDURES  Procedure(s) performed:  None  Critical Care performed: No  ____________________________________________   INITIAL IMPRESSION / ASSESSMENT AND PLAN / ED COURSE  Pertinent labs & imaging results that were available during my care of the patient were reviewed by me and considered in my medical decision making (see chart for details).  Painless rectal bleeding. Patient's hemoglobin is very stable and her vital signs are normal. She has no evidence of acute anemia. Based on her ongoing symptomatology and appears that she is likely suffering from a ongoing but small amount of bleeding from etiologies which may include diverticulosis, polyp, hemorrhoid, tumor, AVM, or other causes of painless rectal bleeding.  I will place her on a PPI in case she does have any element of gastric bleeding or ulcer, though her symptoms don't seem to strongly suggest this. I discussed with the patient starting over-the-counter  Nexium daily and she will do this. She'll follow-up with gastroenterology and I have placed a referral to our on-call gastroenterologist for sooner follow-up should be available prior to her August 5 date.  We discussed return precautions including if she is to start passing more blood, feel lightheaded, if she has trouble breathing, develops abdominal pain or fever, starts noticing very dark stool or should she start seeing the passage of large amounts of blood or clots. She is very agreeable.  Presently no evidence to suggest the patient has a large volume of bleeding. ____________________________________________  Urinalysis demonstrates probable urinary tract infection. No evidence of calm. Infection, we'll treat with single dose Monurol in the ER.  FINAL CLINICAL IMPRESSION(S) / ED DIAGNOSES  Final diagnoses:  Rectal bleed  Urinary tract infection, acute      Delman Kitten, MD 11/03/14 1800

## 2014-11-03 NOTE — ED Notes (Signed)
Pt c/o intermittent bloody stools x3 weeks. She reports weakness as well. Pt thinks bleeding is related to her new medicine Escitalopram.

## 2014-11-03 NOTE — ED Notes (Signed)
Pt. Stated she passed gas into bed and scant amount of liquid came out of rectum.  Slight tinge of blood with it.  Pt. Also had scant amount blood on tissue after wiping.

## 2014-11-04 ENCOUNTER — Telehealth: Payer: Self-pay | Admitting: Family Medicine

## 2014-11-04 NOTE — Telephone Encounter (Signed)
Jana Half, from  [facility] called they need a referral for pt.pt appt.is July 29th Dr.  Ronald Lobo office call back # is   [phone]

## 2014-11-04 NOTE — Telephone Encounter (Signed)
Cancelled this as she will be following [facility] for [facility]

## 2014-11-13 ENCOUNTER — Other Ambulatory Visit: Payer: Self-pay

## 2014-11-14 ENCOUNTER — Ambulatory Visit: Payer: Commercial Managed Care - HMO | Admitting: Gastroenterology

## 2014-11-14 ENCOUNTER — Encounter: Payer: Self-pay | Admitting: Gastroenterology

## 2014-11-14 ENCOUNTER — Ambulatory Visit (INDEPENDENT_AMBULATORY_CARE_PROVIDER_SITE_OTHER): Payer: Commercial Managed Care - HMO | Admitting: Gastroenterology

## 2014-11-14 VITALS — BP 138/69 | HR 70 | Temp 98.2°F | Ht 64.0 in | Wt 216.0 lb

## 2014-11-14 DIAGNOSIS — K921 Melena: Secondary | ICD-10-CM | POA: Diagnosis not present

## 2014-11-14 DIAGNOSIS — R935 Abnormal findings on diagnostic imaging of other abdominal regions, including retroperitoneum: Secondary | ICD-10-CM

## 2014-11-14 NOTE — Progress Notes (Signed)
Gastroenterology Consultation  Referring Provider:     Arlis Porta., MD Primary Care Physician:  Dicky Doe, MD Primary Gastroenterologist:  Dr. Allen Norris     Reason for Consultation:     Rectal bleeding and abnormal CT scan        HPI:   Kelly Weber is a 69 y.o. y/o female referred for consultation & management of rectal bleeding and abnormal CT scan by Dr. Dicky Doe, MD.  Patient comes today with a report of intermittent diarrhea that has been present for a long time. She states that sometimes she has to move her bowels and oblique of the night. She also has had some rectal bleeding. The patient has had multiple colonoscopies in the past for colon polyps but her last colonoscopy was 5 years ago. She had a CT scan for hematuria by the urologist and was found to have a lymph node that was enlarged near the stomach. The patient is now sent to me for evaluation of the lymph node in the rectal bleeding. There is no report of any unexplained weight loss in fact the patient states she has gained weight. There is no report of any nausea or vomiting associated with these symptoms. The patient denies having any other concerns at the present time.  Past Medical History  Diagnosis Date  . Depression   . Insomnia 2006  . Arthritis   . Anxiety   . Skin cancer 2007    Basal Cell CA resected from Left middle finger    Past Surgical History  Procedure Laterality Date  . Tubal ligation  1970    Prior to Admission medications   Medication Sig Start Date End Date Taking? Authorizing Provider  escitalopram (LEXAPRO) 20 MG tablet Take by mouth. 06/11/14  Yes Historical Provider, MD  esomeprazole (NEXIUM) 40 MG capsule Take 1 capsule (40 mg total) by mouth daily. 11/03/14 11/03/15 Yes Delman Kitten, MD  lactobacillus acidophilus (BACID) TABS tablet Take 2 tablets by mouth 3 (three) times daily.    Historical Provider, MD  meloxicam (MOBIC) 15 MG tablet take 1 tablet by mouth once daily  with meals 10/28/14   Historical Provider, MD  Multiple Vitamin (MULTI-VITAMINS) TABS Take by mouth.    Historical Provider, MD    Family History  Problem Relation Age of Onset  . Brain cancer Mother   . Thyroid disease Mother      History  Substance Use Topics  . Smoking status: Former Smoker -- 1.00 packs/day    Types: Cigarettes    Quit date: 01/19/2014  . Smokeless tobacco: Never Used  . Alcohol Use: No    Allergies as of 11/14/2014 - Review Complete 11/14/2014  Allergen Reaction Noted  . Penicillins Other (See Comments) 09/23/2014    Review of Systems:    All systems reviewed and negative except where noted in HPI.   Physical Exam:  BP 138/69 mmHg  Pulse 70  Temp(Src) 98.2 F (36.8 C) (Oral)  Ht 5\' 4"  (1.626 m)  Wt 216 lb (97.977 kg)  BMI 37.06 kg/m2 No LMP recorded. Patient is postmenopausal. Psych:  Alert and cooperative. Normal mood and affect. General:   Alert,  Well-developed, well-nourished, pleasant and cooperative in NAD Head:  Normocephalic and atraumatic. Eyes:  Sclera clear, no icterus.   Conjunctiva pink. Ears:  Normal auditory acuity. Nose:  No deformity, discharge, or lesions. Mouth:  No deformity or lesions,oropharynx pink & moist. Neck:  Supple; no masses or thyromegaly.  Lungs:  Respirations even and unlabored.  Clear throughout to auscultation.   No wheezes, crackles, or rhonchi. No acute distress. Heart:  Regular rate and rhythm; no murmurs, clicks, rubs, or gallops. Abdomen:  Normal bowel sounds.  No bruits.  Soft, non-tender and non-distended without masses, hepatosplenomegaly or hernias noted.  No guarding or rebound tenderness.  Negative Carnett sign.   Rectal:  Deferred.  Msk:  Symmetrical without gross deformities.  Good, equal movement & strength bilaterally. Pulses:  Normal pulses noted. Extremities:  No clubbing or edema.  No cyanosis. Neurologic:  Alert and oriented x3;  grossly normal neurologically. Skin:  Intact without  significant lesions or rashes.  No jaundice. Lymph Nodes:  No significant cervical adenopathy. Psych:  Alert and cooperative. Normal mood and affect.  Imaging Studies: Ct Abdomen Pelvis W Wo Contrast  10/17/2014   CLINICAL DATA:  69 year old female with foul odor in the urine for the past 2-3 months. Microscopic hematuria. Prior smoker (quit in October 2015).  EXAM: CT ABDOMEN AND PELVIS WITHOUT AND WITH CONTRAST  TECHNIQUE: Multidetector CT imaging of the abdomen and pelvis was performed following the standard protocol before and following the bolus administration of intravenous contrast.  CONTRAST:  154mL OMNIPAQUE IOHEXOL 350 MG/ML SOLN  COMPARISON:  No priors.  Chest CT 01/18/2014.  FINDINGS: Lower chest: 4 mm subpleural nodule in the periphery of the right lower lobe (image 18 of series 8). 4 mm right lower lobe nodule (image 4 of series 6). 4 mm right middle lobe nodule (image 2 of series 6), unchanged. 3 mm left lower lobe nodule (image 88 of series 6), unchanged.  Hepatobiliary: Sub cm low-attenuation lesion in segment 8 of the liver (image 7 of series 4) is too small to definitively characterize, but is statistically likely a tiny cyst. No other suspicious appearing cystic or solid hepatic lesions. No intra or extrahepatic biliary ductal dilatation. Gallbladder is normal in appearance.  Pancreas: No pancreatic mass. No pancreatic ductal dilatation. No pancreatic or peripancreatic fluid or inflammatory changes.  Spleen: Unremarkable.  Adrenals/Urinary Tract: 2.8 x 2.2 cm low-attenuation (-4 HU) left adrenal nodule in the posterior aspect of the medial limb, compatible with an adenoma. Right adrenal gland is normal in appearance. Bilateral kidneys are normal in appearance. No hydroureteronephrosis. Urinary bladder is generally normal in appearance, with exception of some nondependent gas, which is presumably iatrogenic.  Stomach/Bowel: Normal appearance of the stomach. No pathologic dilatation of small  bowel or colon. Normal appendix.  Vascular/Lymphatic: Mild atherosclerosis throughout the abdominal and pelvic vasculature, without evidence of aneurysm or dissection. 1.7 cm short axis lymph node in the gastrohepatic ligament appears larger than the prior examination from 01/18/2014. No other lymphadenopathy noted in the abdomen or pelvis.  Reproductive: Uterus and ovaries are unremarkable.  Other: No significant volume of ascites.  No pneumoperitoneum.  Musculoskeletal: There are no aggressive appearing lytic or blastic lesions noted in the visualized portions of the skeleton.  IMPRESSION: 1. Nondependent gas in the lumen of the urinary bladder. If there has been recent catheterization for urinalysis, this is presumably iatrogenic. In the absence of such history, correlation with urinalysis would be recommended to exclude the possibility of infection with gas-forming organisms. No findings to suggest fistulous connection between the urinary bladder and adjacent loops of bowel. The possibility of vesicovaginal fistula could also be considered, but no definitive imaging findings are seen on today's examination to suggest vesicovaginal fistula. 2. Enlarging 1.7 cm short axis gastrohepatic ligament lymph node adjacent to the  lesser curvature in the proximal fundus of the stomach. The stomach is largely decompressed, and grossly unremarkable in appearance on today's examination. However, further evaluation with upper endoscopy should be considered if there is any clinical concern for gastric or distal esophageal neoplasm. 3. Multiple small pulmonary nodules in the lung bases bilaterally, many of which are unchanged compared to the prior study. Two 4 mm nodules in the right lung cannot be confirmed on the prior examination (obscured at that time by atelectasis and/or airspace consolidation). As such, repeat noncontrast chest CT is suggested in 1 year to ensure stability given the patient's prior smoking history. This  recommendation follows the consensus statement: Guidelines for Management of Small Pulmonary Nodules Detected on CT Scans: A Statement from the [employer] as published in Radiology 2005;237:395-400. 4. Atherosclerosis. 5. Normal appendix. 6. Additional incidental findings, as above.   Electronically Signed   By: Vinnie Langton M.D.   On: 10/17/2014 16:13    Assessment and Plan:   Kelly Weber is a 69 y.o. y/o female who comes in today with an enlarged lymph node of 1.7 cm in the gastrohepatic ligament area with out any other abnormalities seen. The patient also has had some rectal bleeding. Since she has had a history of colon polyps and because of her abnormal CT scan of her abdomen she will be set up for a colonoscopy and EGD.I have discussed risks & benefits which include, but are not limited to, bleeding, infection, perforation & drug reaction.  The patient agrees with this plan & written consent will be obtained.      Note: This dictation was prepared with Dragon dictation along with smaller phrase technology. Any transcriptional errors that result from this process are unintentional.

## 2014-11-15 ENCOUNTER — Encounter: Payer: Self-pay | Admitting: *Deleted

## 2014-11-18 NOTE — Discharge Instructions (Signed)

## 2014-11-20 ENCOUNTER — Ambulatory Visit: Payer: Commercial Managed Care - HMO | Admitting: Anesthesiology

## 2014-11-20 ENCOUNTER — Other Ambulatory Visit: Payer: Self-pay | Admitting: Gastroenterology

## 2014-11-20 ENCOUNTER — Ambulatory Visit
Admission: RE | Admit: 2014-11-20 | Discharge: 2014-11-20 | Disposition: A | Discharge status: Home / Self Care | Payer: Commercial Managed Care - HMO | Source: Ambulatory Visit | Attending: Gastroenterology | Admitting: Gastroenterology

## 2014-11-20 ENCOUNTER — Encounter
Admission: RE | Disposition: A | Discharge status: Home / Self Care | Payer: Self-pay | Source: Ambulatory Visit | Attending: Gastroenterology

## 2014-11-20 DIAGNOSIS — Z860100 Personal history of colon polyps, unspecified: Secondary | ICD-10-CM | POA: Insufficient documentation

## 2014-11-20 DIAGNOSIS — K219 Gastro-esophageal reflux disease without esophagitis: Secondary | ICD-10-CM | POA: Insufficient documentation

## 2014-11-20 DIAGNOSIS — K449 Diaphragmatic hernia without obstruction or gangrene: Secondary | ICD-10-CM | POA: Diagnosis not present

## 2014-11-20 DIAGNOSIS — D124 Benign neoplasm of descending colon: Secondary | ICD-10-CM | POA: Diagnosis not present

## 2014-11-20 DIAGNOSIS — Z8349 Family history of other endocrine, nutritional and metabolic diseases: Secondary | ICD-10-CM | POA: Diagnosis not present

## 2014-11-20 DIAGNOSIS — K640 First degree hemorrhoids: Secondary | ICD-10-CM | POA: Diagnosis not present

## 2014-11-20 DIAGNOSIS — Z87891 Personal history of nicotine dependence: Secondary | ICD-10-CM | POA: Diagnosis not present

## 2014-11-20 DIAGNOSIS — G4700 Insomnia, unspecified: Secondary | ICD-10-CM | POA: Diagnosis not present

## 2014-11-20 DIAGNOSIS — Z808 Family history of malignant neoplasm of other organs or systems: Secondary | ICD-10-CM | POA: Diagnosis not present

## 2014-11-20 DIAGNOSIS — Z880 Allergy status to penicillin: Secondary | ICD-10-CM | POA: Insufficient documentation

## 2014-11-20 DIAGNOSIS — K529 Noninfective gastroenteritis and colitis, unspecified: Secondary | ICD-10-CM | POA: Insufficient documentation

## 2014-11-20 DIAGNOSIS — Z79899 Other long term (current) drug therapy: Secondary | ICD-10-CM | POA: Insufficient documentation

## 2014-11-20 DIAGNOSIS — F329 Major depressive disorder, single episode, unspecified: Secondary | ICD-10-CM | POA: Insufficient documentation

## 2014-11-20 DIAGNOSIS — K589 Irritable bowel syndrome without diarrhea: Secondary | ICD-10-CM | POA: Insufficient documentation

## 2014-11-20 DIAGNOSIS — F419 Anxiety disorder, unspecified: Secondary | ICD-10-CM | POA: Diagnosis not present

## 2014-11-20 DIAGNOSIS — R933 Abnormal findings on diagnostic imaging of other parts of digestive tract: Secondary | ICD-10-CM | POA: Diagnosis not present

## 2014-11-20 DIAGNOSIS — K6389 Other specified diseases of intestine: Secondary | ICD-10-CM

## 2014-11-20 DIAGNOSIS — Z85828 Personal history of other malignant neoplasm of skin: Secondary | ICD-10-CM | POA: Insufficient documentation

## 2014-11-20 HISTORY — DX: Gastro-esophageal reflux disease without esophagitis: K21.9

## 2014-11-20 HISTORY — PX: COLONOSCOPY WITH PROPOFOL: SHX5780

## 2014-11-20 HISTORY — PX: ESOPHAGOGASTRODUODENOSCOPY (EGD) WITH PROPOFOL: SHX5813

## 2014-11-20 HISTORY — PX: POLYPECTOMY: SHX5525

## 2014-11-20 SURGERY — COLONOSCOPY WITH PROPOFOL
Anesthesia: Monitor Anesthesia Care | Wound class: Contaminated

## 2014-11-20 MED ORDER — SODIUM CHLORIDE 0.9 % IV SOLN: INTRAVENOUS | Status: DC

## 2014-11-20 MED ORDER — LIDOCAINE HCL (CARDIAC) 20 MG/ML IV SOLN
INTRAVENOUS | Status: DC | PRN
  Administered 2014-11-20: 30 mg via INTRAVENOUS

## 2014-11-20 MED ORDER — PROPOFOL 10 MG/ML IV BOLUS
INTRAVENOUS | Status: DC | PRN
  Administered 2014-11-20 (×8): 20 mg via INTRAVENOUS

## 2014-11-20 MED ORDER — STERILE WATER FOR IRRIGATION IR SOLN
Status: DC | PRN
  Administered 2014-11-20: 08:00:00

## 2014-11-20 MED ORDER — ONDANSETRON HCL 4 MG/2ML IJ SOLN: 4.0000 mg | Freq: Once | INTRAMUSCULAR | Status: DC

## 2014-11-20 SURGICAL SUPPLY — 39 items
BALLN DILATOR 10-12 8 (BALLOONS)
BALLN DILATOR 12-15 8 (BALLOONS)
BALLN DILATOR 15-18 8 (BALLOONS)
BALLN DILATOR CRE 0-12 8 (BALLOONS)
BALLN DILATOR ESOPH 8 10 CRE (MISCELLANEOUS) IMPLANT
BALLOON DILATOR 12-15 8 (BALLOONS) IMPLANT
BALLOON DILATOR 15-18 8 (BALLOONS) IMPLANT
BALLOON DILATOR CRE 0-12 8 (BALLOONS) IMPLANT
BLOCK BITE 60FR ADLT L/F GRN (MISCELLANEOUS) ×3 IMPLANT
CANISTER SUCT 1200ML W/VALVE (MISCELLANEOUS) ×3 IMPLANT
FCP ESCP3.2XJMB 240X2.8X (MISCELLANEOUS)
FORCEPS BIOP RAD 4 LRG CAP 4 (CUTTING FORCEPS) ×1 IMPLANT
FORCEPS BIOP RJ4 240 W/NDL (MISCELLANEOUS)
FORCEPS ESCP3.2XJMB 240X2.8X (MISCELLANEOUS) IMPLANT
GOWN CVR UNV OPN BCK APRN NK (MISCELLANEOUS) ×4 IMPLANT
GOWN ISOL THUMB LOOP REG UNIV (MISCELLANEOUS) ×6
HEMOCLIP INSTINCT (CLIP) IMPLANT
INJECTOR VARIJECT VIN23 (MISCELLANEOUS) IMPLANT
KIT CO2 TUBING (TUBING) IMPLANT
KIT DEFENDO VALVE AND CONN (KITS) IMPLANT
KIT ENDO PROCEDURE OLY (KITS) ×3 IMPLANT
LIGATOR MULTIBAND 6SHOOTER MBL (MISCELLANEOUS) IMPLANT
MARKER SPOT ENDO TATTOO 5ML (MISCELLANEOUS) IMPLANT
PAD GROUND ADULT SPLIT (MISCELLANEOUS) IMPLANT
SNARE SHORT THROW 13M SML OVAL (MISCELLANEOUS) IMPLANT
SNARE SHORT THROW 30M LRG OVAL (MISCELLANEOUS) IMPLANT
SPOT EX ENDOSCOPIC TATTOO (MISCELLANEOUS)
SUCTION POLY TRAP 4CHAMBER (MISCELLANEOUS) IMPLANT
SYR INFLATION 60ML (SYRINGE) IMPLANT
TRAP SUCTION POLY (MISCELLANEOUS) IMPLANT
TUBING CONN 6MMX3.1M (TUBING)
TUBING SUCTION CONN 0.25 STRL (TUBING) IMPLANT
UNDERPAD 30X60 958B10 (PK) (MISCELLANEOUS) IMPLANT
VALVE BIOPSY ENDO (VALVE) IMPLANT
VARIJECT INJECTOR VIN23 (MISCELLANEOUS)
WATER AUXILLARY (MISCELLANEOUS) IMPLANT
WATER STERILE IRR 250ML POUR (IV SOLUTION) ×3 IMPLANT
WATER STERILE IRR 500ML POUR (IV SOLUTION) IMPLANT
WIRE CRE 18-20MM 8CM F G (MISCELLANEOUS) IMPLANT

## 2014-11-20 NOTE — H&P (Signed)
  [facility]  [address] [place], [place] [address] Phone: [phone] Fax : [phone]  Primary Care Physician:  Dicky Doe, MD Primary Gastroenterologist:  Dr. Allen Norris  Pre-Procedure History & Physical: HPI:  Kelly Weber is a 69 y.o. female is here for an endoscopy and colonoscopy.   Past Medical History  Diagnosis Date  . Depression   . Insomnia 2006  . Anxiety   . Arthritis     fingers, left knee  . GERD (gastroesophageal reflux disease)   . Skin cancer 2007    Basal Cell CA resected from Left middle finger    Past Surgical History  Procedure Laterality Date  . Tubal ligation  1970  . Skin cancer excision Left     finger    Prior to Admission medications   Medication Sig Start Date End Date Taking? Authorizing Provider  escitalopram (LEXAPRO) 20 MG tablet Take by mouth. 06/11/14  Yes Historical Provider, MD  esomeprazole (NEXIUM) 40 MG capsule Take 1 capsule (40 mg total) by mouth daily. 11/03/14 11/03/15 Yes Delman Kitten, MD  lactobacillus acidophilus (BACID) TABS tablet Take 2 tablets by mouth 3 (three) times daily.   Yes Historical Provider, MD  meloxicam (MOBIC) 15 MG tablet take 1 tablet by mouth once daily with meals 10/28/14  Yes Historical Provider, MD  Multiple Vitamin (MULTI-VITAMINS) TABS Take by mouth.   Yes Historical Provider, MD    Allergies as of 11/14/2014 - Review Complete 11/14/2014  Allergen Reaction Noted  . Penicillins Other (See Comments) 09/23/2014    Family History  Problem Relation Age of Onset  . Brain cancer Mother   . Thyroid disease Mother     Social History   Social History  . Marital Status: Married    Spouse Name: N/A  . Number of Children: N/A  . Years of Education: N/A   Occupational History  . Not on file.   Social History Main Topics  . Smoking status: Former Smoker -- 1.00 packs/day for 30 years    Types: Cigarettes    Quit date: 01/19/2014  . Smokeless tobacco: Never Used  . Alcohol  Use: No  . Drug Use: No  . Sexual Activity: Not on file   Other Topics Concern  . Not on file   Social History Narrative    Review of Systems: See HPI, otherwise negative ROS  Physical Exam: Ht 5\' 5"  (1.651 m)  Wt 211 lb (95.709 kg)  BMI 35.11 kg/m2 General:   Alert,  pleasant and cooperative in NAD Head:  Normocephalic and atraumatic. Neck:  Supple; no masses or thyromegaly. Lungs:  Clear throughout to auscultation.    Heart:  Regular rate and rhythm. Abdomen:  Soft, nontender and nondistended. Normal bowel sounds, without guarding, and without rebound.   Neurologic:  Alert and  oriented x4;  grossly normal neurologically.  Impression/Plan: Kelly Weber is here for an endoscopy and colonoscopy to be performed for history of polyps and bnormal CT  Risks, benefits, limitations, and alternatives regarding  endoscopy and colonoscopy have been reviewed with the patient.  Questions have been answered.  All parties agreeable.   Ollen Bowl, MD  11/20/2014, 7:47 AM

## 2014-11-20 NOTE — Transfer of Care (Signed)
Immediate Anesthesia Transfer of Care Note  Patient: Kelly Weber  Procedure(s) Performed: Procedure(s): COLONOSCOPY WITH PROPOFOL (N/A) ESOPHAGOGASTRODUODENOSCOPY (EGD) WITH PROPOFOL (N/A)  Patient Location: PACU  Anesthesia Type: MAC  Level of Consciousness: awake, alert  and patient cooperative  Airway and Oxygen Therapy: Patient Spontanous Breathing and Patient connected to supplemental oxygen  Post-op Assessment: Post-op Vital signs reviewed, Patient's Cardiovascular Status Stable, Respiratory Function Stable, Patent Airway and No signs of Nausea or vomiting  Post-op Vital Signs: Reviewed and stable  Complications: No apparent anesthesia complications

## 2014-11-20 NOTE — Op Note (Signed)
[facility] Gastroenterology Patient Name: Kelly Weber Procedure Date: 11/20/2014 8:12 AM MRN: [id number] Account #: [id number] Date of Birth: 31-Jan-1946 Admit Type: Outpatient Age: 69 Room: [facility] OR ROOM 01 Gender: Female Note Status: Finalized Procedure:         Upper GI endoscopy Indications:       Abnormal CT of the GI tract Providers:         Lucilla Lame, MD Referring MD:      Arlis Porta, MD (Referring MD) Medicines:         Propofol per Anesthesia Complications:     No immediate complications. Procedure:         Pre-Anesthesia Assessment:                    - Prior to the procedure, a History and Physical was                     performed, and patient medications and allergies were                     reviewed. The patient's tolerance of previous anesthesia                     was also reviewed. The risks and benefits of the procedure                     and the sedation options and risks were discussed with the                     patient. All questions were answered, and informed consent                     was obtained. Prior Anticoagulants: The patient has taken                     no previous anticoagulant or antiplatelet agents. ASA                     Grade Assessment: II - A patient with mild systemic                     disease. After reviewing the risks and benefits, the                     patient was deemed in satisfactory condition to undergo                     the procedure.                    After obtaining informed consent, the endoscope was passed                     under direct vision. Throughout the procedure, the                     patient's blood pressure, pulse, and oxygen saturations                     were monitored continuously. The Olympus GIF-[id number]                     Endoscope (S#. [id number]) was introduced through the mouth,  and advanced to the second part of duodenum. The upper GI      endoscopy was accomplished without difficulty. The patient                     tolerated the procedure well. Findings:      A small hiatus hernia was present. This was biopsied with a cold forceps       for histology.      The stomach was normal.      The examined duodenum was normal. Impression:        - Small hiatus hernia. Biopsied.                    - Normal stomach.                    - Normal examined duodenum. Recommendation:    - Await pathology results. Procedure Code(s): --- Professional ---                    [phone], Esophagogastroduodenoscopy, flexible, transoral;                     with biopsy, single or multiple Diagnosis Code(s): --- Professional ---                    R93.3, Abnormal findings on diagnostic imaging of other                     parts of digestive tract                    K44.9, Diaphragmatic hernia without obstruction or gangrene CPT copyright 2014 American Medical Association. All rights reserved. The codes documented in this report are preliminary and upon coder review may  be revised to meet current compliance requirements. Lucilla Lame, MD 11/20/2014 8:25:12 AM This report has been signed electronically. Number of Addenda: 0 Note Initiated On: 11/20/2014 8:12 AM Total Procedure Duration: 0 hours 2 minutes 18 seconds       [facility]

## 2014-11-20 NOTE — Anesthesia Postprocedure Evaluation (Signed)
  Anesthesia Post-op Note  Patient: Kelly Weber  Procedure(s) Performed: Procedure(s): COLONOSCOPY WITH PROPOFOL (N/A) ESOPHAGOGASTRODUODENOSCOPY (EGD) WITH PROPOFOL (N/A) POLYPECTOMY  Anesthesia type:MAC  Patient location: PACU  Post pain: Pain level controlled  Post assessment: Post-op Vital signs reviewed, Patient's Cardiovascular Status Stable, Respiratory Function Stable, Patent Airway and No signs of Nausea or vomiting  Post vital signs: Reviewed and stable  Last Vitals:  Filed Vitals:   11/20/14 0842  BP:   Pulse:   Temp: 36.4 C  Resp:     Level of consciousness: awake, alert  and patient cooperative  Complications: No apparent anesthesia complications

## 2014-11-20 NOTE — Anesthesia Preprocedure Evaluation (Addendum)
Anesthesia Evaluation  Patient identified by MRN, date of birth, ID band Patient awake    Reviewed: Allergy & Precautions, H&P , NPO status   History of Anesthesia Complications (+) POST - OP SPINAL HEADACHE  Airway Mallampati: I  TM Distance: >3 FB Neck ROM: full    Dental no notable dental hx.    Pulmonary former smoker,    Pulmonary exam normal       Cardiovascular negative cardio ROS Normal cardiovascular exam    Neuro/Psych    GI/Hepatic Neg liver ROS, GERD-  Medicated and Controlled,  Endo/Other  negative endocrine ROS  Renal/GU negative Renal ROS     Musculoskeletal   Abdominal   Peds  Hematology negative hematology ROS (+)   Anesthesia Other Findings   Reproductive/Obstetrics                            Anesthesia Physical Anesthesia Plan  ASA: II  Anesthesia Plan: MAC   Post-op Pain Management:    Induction:   Airway Management Planned:   Additional Equipment:   Intra-op Plan:   Post-operative Plan:   Informed Consent: I have reviewed the patients History and Physical, chart, labs and discussed the procedure including the risks, benefits and alternatives for the proposed anesthesia with the patient or authorized representative who has indicated his/her understanding and acceptance.     Plan Discussed with: CRNA  Anesthesia Plan Comments:        Anesthesia Quick Evaluation

## 2014-11-20 NOTE — Op Note (Signed)
[facility] Gastroenterology Patient Name: Kelly Weber Procedure Date: 11/20/2014 8:12 AM MRN: [id number] Account #: [id number] Date of Birth: [birth year] Admit Type: Outpatient Age: 69 Room: [facility] OR ROOM 01 Gender: Female Note Status: Finalized Procedure:         Colonoscopy Indications:       High risk colon cancer surveillance: Personal history of                     colonic polyps Providers:         Lucilla Lame, MD Medicines:         Propofol per Anesthesia Complications:     No immediate complications. Procedure:         Pre-Anesthesia Assessment:                    - Prior to the procedure, a History and Physical was                     performed, and patient medications and allergies were                     reviewed. The patient's tolerance of previous anesthesia                     was also reviewed. The risks and benefits of the procedure                     and the sedation options and risks were discussed with the                     patient. All questions were answered, and informed consent                     was obtained. Prior Anticoagulants: The patient has taken                     no previous anticoagulant or antiplatelet agents. ASA                     Grade Assessment: II - A patient with mild systemic                     disease. After reviewing the risks and benefits, the                     patient was deemed in satisfactory condition to undergo                     the procedure.                    After obtaining informed consent, the colonoscope was                     passed under direct vision. Throughout the procedure, the                     patient's blood pressure, pulse, and oxygen saturations                     were monitored continuously. The was introduced through                     the anus and advanced to the the cecum, identified by  appendiceal orifice and ileocecal valve. The colonoscopy       was performed without difficulty. The patient tolerated                     the procedure well. The quality of the bowel preparation                     was excellent. Findings:      The perianal and digital rectal examinations were normal.      A 3 mm polyp was found in the descending colon. The polyp was sessile.       The polyp was removed with a cold biopsy forceps. Resection and       retrieval were complete.      Diffuse moderate inflammation characterized by erythema was found in the       rectum, in the recto-sigmoid colon and in the sigmoid colon. Biopsies       were taken with a cold forceps for histology.      Non-bleeding internal hemorrhoids were found during retroflexion. The       hemorrhoids were Grade I (internal hemorrhoids that do not prolapse). Impression:        - One 3 mm polyp in the descending colon. Resected and                     retrieved.                    - Diffuse moderate inflammation was found in the rectum,                     in the recto-sigmoid colon and in the sigmoid colon                     secondary to proctosigmoid colitis. Biopsied.                    - Non-bleeding internal hemorrhoids. Recommendation:    - Await pathology results.                    - Repeat colonoscopy in 5 years for surveillance. Procedure Code(s): --- Professional ---                    [phone], Colonoscopy, flexible; with biopsy, single or                     multiple Diagnosis Code(s): --- Professional ---                    Z86.010, Personal history of colonic polyps                    D12.4, Benign neoplasm of descending colon                    K63.89, Other specified diseases of intestine CPT copyright 2014 American Medical Association. All rights reserved. The codes documented in this report are preliminary and upon coder review may  be revised to meet current compliance requirements. Lucilla Lame, MD 11/20/2014 8:41:16 AM This report has been signed  electronically. Number of Addenda: 0 Note Initiated On: 11/20/2014 8:12 AM Scope Withdrawal Time: 0 hours 7 minutes 29 seconds  Total Procedure Duration: 0 hours 11 minutes 17 seconds       [facility]

## 2014-11-21 ENCOUNTER — Encounter: Payer: Self-pay | Admitting: Gastroenterology

## 2014-11-29 ENCOUNTER — Telehealth: Payer: Self-pay

## 2014-11-29 NOTE — Telephone Encounter (Signed)
Patient called stating that she continues to have rectal bleeding and would like to know if she could come and see Dr. Allen Norris. Patient stated that per Dr. Dorothey Baseman recommendations, she should go to the ED if she had some rectal bleeding. However, she doesn't want to go because they never do anything for her. Please advise patient. [phone]. Thank you.

## 2014-11-29 NOTE — Telephone Encounter (Signed)
Please see pt's pathology results. States active colitis. Pt having a lot of diarrhea. The rectal bleeding is just on her toilet paper. Sometimes its bright red other times its brown. States there is odor with it.  Please advise if you can just prescribe something or if she needs a follow up appt. Next available OV with you is Sept 22nd.

## 2014-12-02 ENCOUNTER — Encounter: Payer: Self-pay | Admitting: Gastroenterology

## 2014-12-03 NOTE — Telephone Encounter (Signed)
The patient know that the biopsy showed an acute inflammation of the colon that may have been related to the prep from her colonoscopy for an acute infection versus anti-inflammatory medication use. If she is still using anti-latter medications please tell her to stop these. She continues to have diarrhea with bleeding and please let me now.

## 2014-12-03 NOTE — Telephone Encounter (Signed)
Pt is still a lot of diarrhea and some bloody diarrhea. Would like to discuss this with you. She stated that all this was going on before the procedure. This was not noted when she scheduled procedure.

## 2014-12-04 NOTE — Telephone Encounter (Signed)
Please order stool studies on the patient.

## 2014-12-04 NOTE — Telephone Encounter (Signed)
Pt notified Dr. Wohl has requested she do the diatherix. Pt will go by [place] office to pick up kit.  

## 2015-01-17 ENCOUNTER — Ambulatory Visit
Admission: RE | Admit: 2015-01-17 | Discharge: 2015-01-17 | Disposition: A | Discharge status: Home / Self Care | Payer: Commercial Managed Care - HMO | Source: Ambulatory Visit | Attending: Family Medicine | Admitting: Family Medicine

## 2015-01-17 ENCOUNTER — Ambulatory Visit (INDEPENDENT_AMBULATORY_CARE_PROVIDER_SITE_OTHER): Payer: Commercial Managed Care - HMO | Admitting: Family Medicine

## 2015-01-17 ENCOUNTER — Encounter: Payer: Self-pay | Admitting: Family Medicine

## 2015-01-17 VITALS — BP 111/76 | HR 71 | Temp 98.1°F | Resp 16 | Ht 64.0 in | Wt 213.6 lb

## 2015-01-17 DIAGNOSIS — K449 Diaphragmatic hernia without obstruction or gangrene: Secondary | ICD-10-CM | POA: Diagnosis not present

## 2015-01-17 DIAGNOSIS — R0600 Dyspnea, unspecified: Secondary | ICD-10-CM

## 2015-01-17 DIAGNOSIS — R0609 Other forms of dyspnea: Principal | ICD-10-CM

## 2015-01-17 DIAGNOSIS — K6389 Other specified diseases of intestine: Secondary | ICD-10-CM | POA: Diagnosis not present

## 2015-01-17 DIAGNOSIS — K529 Noninfective gastroenteritis and colitis, unspecified: Secondary | ICD-10-CM

## 2015-01-17 DIAGNOSIS — F32A Depression, unspecified: Secondary | ICD-10-CM

## 2015-01-17 DIAGNOSIS — F329 Major depressive disorder, single episode, unspecified: Secondary | ICD-10-CM | POA: Diagnosis not present

## 2015-01-17 MED ORDER — OMEPRAZOLE 40 MG PO CPDR: 40.0000 mg | DELAYED_RELEASE_CAPSULE | Freq: Every day | ORAL | Status: DC

## 2015-01-17 MED ORDER — ESCITALOPRAM OXALATE 10 MG PO TABS: 10.0000 mg | ORAL_TABLET | Freq: Every day | ORAL | Status: DC

## 2015-01-17 NOTE — Progress Notes (Signed)
Name: Kelly Weber   MRN: [id number]    DOB: [birth year]   Date:01/17/2015       Progress Note  Subjective  Chief Complaint  Chief Complaint  Patient presents with  . Depression    is improved wants to tapper off or possible just want to take half    Depression        Associated symptoms include no myalgias and no headaches.  Here to decide about tapering off Lexapro.  Feeling very well overall..  Her episode of GI bleed has resolved.  ? Dx. Of colitis.  Feels aout 8/10 overall.  Still some SOB with exercise. No problem-specific assessment & plan notes found for this encounter.   Past Medical History  Diagnosis Date  . Depression   . Insomnia 2006  . Anxiety   . Arthritis     fingers, left knee  . GERD (gastroesophageal reflux disease)   . Skin cancer 2007    Basal Cell CA resected from Left middle finger    Social History  Substance Use Topics  . Smoking status: Former Smoker -- 1.00 packs/day for 30 years    Types: Cigarettes    Quit date: 01/19/2014  . Smokeless tobacco: Never Used  . Alcohol Use: No     Current outpatient prescriptions:  .  aspirin 81 MG tablet, Take 81 mg by mouth daily., Disp: , Rfl:  .  escitalopram (LEXAPRO) 20 MG tablet, Take by mouth., Disp: , Rfl:  .  esomeprazole (NEXIUM) 40 MG capsule, Take 1 capsule (40 mg total) by mouth daily., Disp: 30 capsule, Rfl: 1 .  lactobacillus acidophilus (BACID) TABS tablet, Take 2 tablets by mouth 3 (three) times daily., Disp: , Rfl:  .  Multiple Vitamin (MULTI-VITAMINS) TABS, Take by mouth., Disp: , Rfl:   Allergies  Allergen Reactions  . Penicillins Other (See Comments)    Review of Systems  Constitutional: Negative for fever, chills, weight loss and malaise/fatigue.  HENT: Negative for hearing loss.   Eyes: Negative for blurred vision and double vision.  Respiratory: Negative for cough, sputum production, shortness of breath and wheezing.   Cardiovascular: Negative for chest pain, palpitations  and leg swelling.  Gastrointestinal: Negative for heartburn, nausea, vomiting, abdominal pain, diarrhea, constipation and blood in stool.  Genitourinary: Negative for dysuria, urgency and frequency.  Musculoskeletal: Negative for myalgias and joint pain.  Skin: Negative for rash.  Neurological: Negative for dizziness, weakness and headaches.  Psychiatric/Behavioral: Negative for depression. The patient is not nervous/anxious.       Objective  Filed Vitals:   01/17/15 1302  BP: 111/76  Pulse: 71  Temp: 98.1 F (36.7 C)  TempSrc: Oral  Resp: 16  Height: '5\' 4"'  (1.626 m)  Weight: 213 lb 9.6 oz (96.888 kg)     Physical Exam  Constitutional: She is oriented to person, place, and time and well-developed, well-nourished, and in no distress. No distress.  HENT:  Head: Normocephalic and atraumatic.  Eyes: Conjunctivae and EOM are normal. Pupils are equal, round, and reactive to light.  Neck: Normal range of motion. Neck supple. Carotid bruit is not present. No thyromegaly present.  Cardiovascular: Normal rate, regular rhythm, normal heart sounds and intact distal pulses.  Exam reveals no gallop and no friction rub.   No murmur heard. Pulmonary/Chest: Effort normal and breath sounds normal. No respiratory distress. She has no wheezes. She has no rales.  Abdominal: Soft. Bowel sounds are normal. She exhibits no distension, no abdominal bruit and no  mass. There is no tenderness.  Musculoskeletal: Normal range of motion. She exhibits no edema.  Lymphadenopathy:    She has no cervical adenopathy.  Neurological: She is alert and oriented to person, place, and time.  Vitals reviewed.     Recent Results (from the past 2160 hour(s))  Comprehensive metabolic panel     Status: Abnormal   Collection Time: 11/03/14  1:47 PM  Result Value Ref Range   Sodium 138 135 - 145 mmol/L   Potassium 4.2 3.5 - 5.1 mmol/L   Chloride 102 101 - 111 mmol/L   CO2 29 22 - 32 mmol/L   Glucose, Bld 95 65 -  99 mg/dL   BUN 17 6 - 20 mg/dL   Creatinine, Ser 1.04 (H) 0.44 - 1.00 mg/dL   Calcium 8.6 (L) 8.9 - 10.3 mg/dL   Total Protein 6.9 6.5 - 8.1 g/dL   Albumin 3.7 3.5 - 5.0 g/dL   AST 17 15 - 41 U/L   ALT 16 14 - 54 U/L   Alkaline Phosphatase 89 38 - 126 U/L   Total Bilirubin 0.4 0.3 - 1.2 mg/dL   GFR calc non Af Amer 54 (L) >60 mL/min   GFR calc Af Amer >60 >60 mL/min    Comment: (NOTE) The eGFR has been calculated using the CKD EPI equation. This calculation has not been validated in all clinical situations. eGFR's persistently <60 mL/min signify possible Chronic Kidney Disease.    Anion gap 7 5 - 15  CBC     Status: None   Collection Time: 11/03/14  1:47 PM  Result Value Ref Range   WBC 7.9 3.6 - 11.0 K/uL   RBC 4.55 3.80 - 5.20 MIL/uL   Hemoglobin 12.3 12.0 - 16.0 g/dL   HCT 37.7 35.0 - 47.0 %   MCV 82.8 80.0 - 100.0 fL   MCH 26.9 26.0 - 34.0 pg   MCHC 32.5 32.0 - 36.0 g/dL   RDW 14.5 11.5 - 14.5 %   Platelets 307 150 - 440 K/uL  Urinalysis complete, with microscopic ([facility] only)     Status: Abnormal   Collection Time: 11/03/14  5:21 PM  Result Value Ref Range   Color, Urine STRAW (A) YELLOW   APPearance CLEAR (A) CLEAR   Glucose, UA NEGATIVE NEGATIVE mg/dL   Bilirubin Urine NEGATIVE NEGATIVE   Ketones, ur NEGATIVE NEGATIVE mg/dL   Specific Gravity, Urine 1.011 1.005 - 1.030   Hgb urine dipstick 1+ (A) NEGATIVE   pH 5.0 5.0 - 8.0   Protein, ur NEGATIVE NEGATIVE mg/dL   Nitrite NEGATIVE NEGATIVE   Leukocytes, UA 3+ (A) NEGATIVE   RBC / HPF 0-5 0 - 5 RBC/hpf   WBC, UA TOO NUMEROUS TO COUNT 0 - 5 WBC/hpf   Bacteria, UA RARE (A) NONE SEEN   Squamous Epithelial / LPF 0-5 (A) NONE SEEN   Mucous PRESENT      Assessment & Plan  1. Clinical depression  - escitalopram (LEXAPRO) 10 MG tablet; Take 1 tablet (10 mg total) by mouth daily.  Dispense: 30 tablet; Refill: 3  2. Inflammatory bowel disease   3. Hiatal hernia  - omeprazole (PRILOSEC) 40 MG capsule; Take 1  capsule (40 mg total) by mouth daily.  Dispense: 30 capsule; Refill: 12  4. Dyspnea on exertion  - DG Chest 2 View; Future

## 2015-01-17 NOTE — Patient Instructions (Signed)
Declines flu shot because of reaction last year.

## 2015-02-04 ENCOUNTER — Telehealth: Payer: Self-pay | Admitting: Family Medicine

## 2015-02-04 NOTE — Telephone Encounter (Signed)
Explained referral good for 6 visits/6 months which ever comes 1st.

## 2015-02-04 NOTE — Telephone Encounter (Signed)
Pt had a colonoscopy on Oct 7th and hasn't received the results of the biopsy.  She scheduled an appt with Dr. Allen Norris for Nov 1st at 4:00.  She asked if she needed another [facility] referral for appt.  Her call back number is [phone]

## 2015-02-11 ENCOUNTER — Encounter: Payer: Self-pay | Admitting: Gastroenterology

## 2015-02-11 ENCOUNTER — Ambulatory Visit (INDEPENDENT_AMBULATORY_CARE_PROVIDER_SITE_OTHER): Payer: Commercial Managed Care - HMO | Admitting: Gastroenterology

## 2015-02-11 VITALS — BP 137/80 | HR 80 | Temp 98.4°F | Wt 212.8 lb

## 2015-02-11 DIAGNOSIS — R197 Diarrhea, unspecified: Secondary | ICD-10-CM | POA: Diagnosis not present

## 2015-02-11 NOTE — Progress Notes (Signed)
Primary Care Physician: Dicky Doe, MD  Primary Gastroenterologist:  Dr. Lucilla Lame  Chief Complaint  Patient presents with  . Diarrhea    HPI: Kelly Weber is a 69 y.o. female here for continued diarrhea. The patient reports that she was doing well up until last week and now is having diarrhea.The patient denies taking any dairy products but on further questioning it appears that she has milk in the morning cheese throughout the day and regularly eats ice cream. She also reports that she has to go to the bathroom shortly after eating and moves her bowels each time after eating. She denies the diarrhea waking her up in the middle of the night. The patient had stool studies that did not show any sign of infection and her colonoscopy showed active inflammation consistent with either acute infection or anti-inflammatories. The patient does take aspirin every day.  Current Outpatient Prescriptions  Medication Sig Dispense Refill  . escitalopram (LEXAPRO) 10 MG tablet Take 1 tablet (10 mg total) by mouth daily. 30 tablet 3  . Multiple Vitamin (MULTI-VITAMINS) TABS Take by mouth.    [name] Kitchen omeprazole (PRILOSEC) 40 MG capsule Take 1 capsule (40 mg total) by mouth daily. 30 capsule 12  . aspirin 81 MG tablet Take 81 mg by mouth daily.    [name] Kitchen lactobacillus acidophilus (BACID) TABS tablet Take 2 tablets by mouth 3 (three) times daily.     No current facility-administered medications for this visit.    Allergies as of 02/11/2015 - Review Complete 02/11/2015  Allergen Reaction Noted  . Penicillins Other (See Comments) 09/23/2014    ROS:  General: Negative for anorexia, weight loss, fever, chills, fatigue, weakness. ENT: Negative for hoarseness, difficulty swallowing , nasal congestion. CV: Negative for chest pain, angina, palpitations, dyspnea on exertion, peripheral edema.  Respiratory: Negative for dyspnea at rest, dyspnea on exertion, cough, sputum, wheezing.  GI: See history of present  illness. GU:  Negative for dysuria, hematuria, urinary incontinence, urinary frequency, nocturnal urination.  Endo: Negative for unusual weight change.    Physical Examination:   BP 137/80 mmHg  Pulse 80  Temp(Src) 98.4 F (36.9 C) (Oral)  Wt 212 lb 12.8 oz (96.525 kg)  General: Well-nourished, well-developed in no acute distress.  Eyes: No icterus. Conjunctivae pink. Neuro: Alert and oriented x 3.  Grossly intact. Skin: Warm and dry, no jaundice.   Psych: Alert and cooperative, normal mood and affect.  Labs:    Imaging Studies: Dg Chest 2 View  01/17/2015  CLINICAL DATA:  Shortness of breath with exertion, former smoking history EXAM: CHEST  2 VIEW COMPARISON:  Chest x-ray of 01/18/2014 and CT chest of the same date FINDINGS: No active infiltrate or effusion is seen. The lungs are slightly hyperaerated. Mediastinal and hilar contours are unremarkable. The heart is within upper limits of normal. No bony abnormality is seen. IMPRESSION: No active cardiopulmonary disease. Electronically Signed   By: Ivar Drape M.D.   On: 01/17/2015 14:25    Assessment and Plan:   [name] is a 69 y.o. y/o female Who comes in with continued diarrhea. The patient had acute inflammation on her colonoscopy consistent with infection versus anti-inflammatory medication. There is no sign of chronicity or inflammatory bowel disease. The patient also partakes in regular dairy intake. The patient has been told to stop dairy products for five days to see if her symptoms are better. If they are not the patient will be started on a daily dose of  Imodium. The patient has symptoms consistent with irritable bowel syndrome versus lactose intolerance. The patient has been explained the plan and agrees with it.   Note: This dictation was prepared with Dragon dictation along with smaller phrase technology. Any transcriptional errors that result from this process are unintentional.

## 2015-02-27 ENCOUNTER — Encounter: Payer: Self-pay | Admitting: Family Medicine

## 2015-02-27 ENCOUNTER — Ambulatory Visit (INDEPENDENT_AMBULATORY_CARE_PROVIDER_SITE_OTHER): Payer: Commercial Managed Care - HMO | Admitting: Family Medicine

## 2015-02-27 VITALS — BP 127/82 | HR 62 | Resp 16 | Ht 65.0 in | Wt 214.0 lb

## 2015-02-27 DIAGNOSIS — R040 Epistaxis: Secondary | ICD-10-CM

## 2015-02-27 DIAGNOSIS — M1990 Unspecified osteoarthritis, unspecified site: Secondary | ICD-10-CM | POA: Diagnosis not present

## 2015-02-27 DIAGNOSIS — J302 Other seasonal allergic rhinitis: Secondary | ICD-10-CM

## 2015-02-27 MED ORDER — LORATADINE 10 MG PO TABS: 10.0000 mg | ORAL_TABLET | Freq: Every day | ORAL | Status: DC

## 2015-02-27 MED ORDER — ACETAMINOPHEN 500 MG PO TABS: ORAL_TABLET | ORAL | Status: DC

## 2015-02-27 NOTE — Progress Notes (Signed)
Name: Kelly Weber   MRN: [id number]    DOB: [birth year]   Date:02/27/2015       Progress Note  Subjective  Chief Complaint  Chief Complaint  Patient presents with  . Epistaxis    When taking Aspirin worse.  [name] Kitchen Headache    Can not take Tylenol  . Arthritis    Pain in Knees     HPI Here c/o nosebleeds and headaches.  Recurred after restarting aspirin that she has been told not to take.  Also says she has daily headaches.  Has congestion and post nasal drip.  No fever.  C/o severe arthritis pain in knees (equal bilaterally).  Every time she takes Aspirin, she has bleeding.    No problem-specific assessment & plan notes found for this encounter.   Past Medical History  Diagnosis Date  . Depression   . Insomnia 2006  . Anxiety   . Arthritis     fingers, left knee  . GERD (gastroesophageal reflux disease)   . Skin cancer 2007    Basal Cell CA resected from Left middle finger    Social History  Substance Use Topics  . Smoking status: Former Smoker -- 1.00 packs/day for 30 years    Types: Cigarettes    Quit date: 01/19/2014  . Smokeless tobacco: Never Used  . Alcohol Use: No     Current outpatient prescriptions:  .  Calcium Carb-Cholecalciferol (GNP CALCIUM 600 +D3 PO), Take by mouth., Disp: , Rfl:  .  escitalopram (LEXAPRO) 10 MG tablet, Take 1 tablet (10 mg total) by mouth daily., Disp: 30 tablet, Rfl: 3 .  Multiple Vitamin (MULTI-VITAMINS) TABS, Take by mouth., Disp: , Rfl:  .  omeprazole (PRILOSEC) 40 MG capsule, Take 1 capsule (40 mg total) by mouth daily., Disp: 30 capsule, Rfl: 12  Allergies  Allergen Reactions  . Penicillins Other (See Comments)    Review of Systems  Constitutional: Negative for fever and chills.  HENT: Positive for congestion and nosebleeds. Negative for sore throat.   Eyes: Negative for blurred vision and double vision.  Respiratory: Negative for cough, shortness of breath and wheezing.   Cardiovascular: Negative for chest pain,  palpitations and leg swelling.  Gastrointestinal: Negative for heartburn, abdominal pain and blood in stool.  Genitourinary: Negative for dysuria, urgency and frequency.  Musculoskeletal: Positive for joint pain (bilteral knees).  Neurological: Positive for headaches. Negative for dizziness and tremors.      Objective  Filed Vitals:   02/27/15 1036  BP: 127/82  Pulse: 62  Resp: 16  Height: 5\' 5"  (1.651 m)  Weight: 214 lb (97.07 kg)     Physical Exam  Constitutional: She is well-developed, well-nourished, and in no distress. No distress.  HENT:  Head: Normocephalic and atraumatic.  Right Ear: External ear normal.  Left Ear: External ear normal.  Nose: Mucosal edema and rhinorrhea present. No epistaxis. Right sinus exhibits no maxillary sinus tenderness and no frontal sinus tenderness. Left sinus exhibits no maxillary sinus tenderness and no frontal sinus tenderness.  Mouth/Throat: Oropharynx is clear and moist.  Cardiovascular: Normal rate, normal heart sounds and intact distal pulses.   Pulmonary/Chest: Effort normal and breath sounds normal.  Musculoskeletal: She exhibits no edema.  Bilateral knees with tenderness along lateral joint lines.  Vitals reviewed.     No results found for this or any previous visit (from the past 2160 hour(s)).   Assessment & Plan  1. Bleeding from the nose -stop all aspirin or NSAIDs.  2.  Arthritis  - acetaminophen (TYLENOL) 500 MG tablet; Take 1-2 talbets every 8 hours as needed for joint pain.  Dispense: 100 tablet; Refill: 0 - Ambulatory referral to Orthopedic Surgery  3. Seasonal allergies  - loratadine (CLARITIN) 10 MG tablet; Take 1 tablet (10 mg total) by mouth daily.  Dispense: 30 tablet; Refill: 11

## 2015-02-28 ENCOUNTER — Telehealth: Payer: Self-pay | Admitting: Family Medicine

## 2015-02-28 NOTE — Telephone Encounter (Signed)
Renewal completed Auth # [id number] Left vmail for patient.

## 2015-02-28 NOTE — Telephone Encounter (Signed)
Pt. Called have  Appt. At  [facility]  Tues 1:00 with  Dr. Tamala Julian  Ortho need authorization pt  Call back  # is [phone]

## 2015-03-11 ENCOUNTER — Telehealth: Payer: Self-pay | Admitting: Family Medicine

## 2015-03-11 NOTE — Telephone Encounter (Signed)
Pt has had a rash on chest for 3 days.  It's not itching but several of her medications has hives as a side effect.  Please call [phone]

## 2015-03-11 NOTE — Telephone Encounter (Signed)
Patient will come in for appt 03/12/15.[place]

## 2015-03-12 ENCOUNTER — Ambulatory Visit (INDEPENDENT_AMBULATORY_CARE_PROVIDER_SITE_OTHER): Payer: Commercial Managed Care - HMO | Admitting: Family Medicine

## 2015-03-12 ENCOUNTER — Encounter: Payer: Self-pay | Admitting: Family Medicine

## 2015-03-12 VITALS — BP 122/78 | HR 73 | Temp 98.0°F | Resp 16 | Ht 65.0 in | Wt 216.2 lb

## 2015-03-12 DIAGNOSIS — L42 Pityriasis rosea: Secondary | ICD-10-CM | POA: Diagnosis not present

## 2015-03-12 NOTE — Patient Instructions (Signed)
Patient reassured re: benign nature of rash.

## 2015-03-12 NOTE — Progress Notes (Signed)
Name: Kelly Weber   MRN: [id number]    DOB: 08-04-1945   Date:03/12/2015       Progress Note  Subjective  Chief Complaint  Chief Complaint  Patient presents with  . Rash    Chest rash..no itching. Started Claritin 02/26/2015. Rash showed up 3 days ago.     HPI  Here c/o a rash on chest 3 days ago.  No itching, pain, tingling or burning.  Staying.   Not really spreading.   Started Claritin 3 weeks ago.  Nothing else new.  No problem-specific assessment & plan notes found for this encounter.   Past Medical History  Diagnosis Date  . Depression   . Insomnia 2006  . Anxiety   . Arthritis     fingers, left knee  . GERD (gastroesophageal reflux disease)   . Skin cancer 2007    Basal Cell CA resected from Left middle finger    Past Surgical History  Procedure Laterality Date  . Tubal ligation  1970  . Skin cancer excision Left     finger  . Colonoscopy with propofol N/A 11/20/2014    Procedure: COLONOSCOPY WITH PROPOFOL;  Surgeon: Lucilla Lame, MD;  Location: [place];  Service: Endoscopy;  Laterality: N/A;  . Esophagogastroduodenoscopy (egd) with propofol N/A 11/20/2014    Procedure: ESOPHAGOGASTRODUODENOSCOPY (EGD) WITH PROPOFOL;  Surgeon: Lucilla Lame, MD;  Location: [place];  Service: Endoscopy;  Laterality: N/A;  . Polypectomy  11/20/2014    Procedure: POLYPECTOMY;  Surgeon: Lucilla Lame, MD;  Location: [place];  Service: Endoscopy;;    Family History  Problem Relation Age of Onset  . Brain cancer Mother   . Thyroid disease Mother     Social History   Social History  . Marital Status: Married    Spouse Name: N/A  . Number of Children: N/A  . Years of Education: N/A   Occupational History  . Not on file.   Social History Main Topics  . Smoking status: Former Smoker -- 1.00 packs/day for 30 years    Types: Cigarettes    Quit date: 01/19/2014  . Smokeless tobacco: Never Used  . Alcohol Use: No  . Drug Use: No  . Sexual  Activity: Not on file   Other Topics Concern  . Not on file   Social History Narrative     Current outpatient prescriptions:  .  acetaminophen (TYLENOL) 500 MG tablet, Take 1-2 talbets every 8 hours as needed for joint pain., Disp: 100 tablet, Rfl: 0 .  Calcium Carb-Cholecalciferol (GNP CALCIUM 600 +D3 PO), Take by mouth., Disp: , Rfl:  .  escitalopram (LEXAPRO) 10 MG tablet, Take 1 tablet (10 mg total) by mouth daily., Disp: 30 tablet, Rfl: 3 .  loratadine (CLARITIN) 10 MG tablet, Take 1 tablet (10 mg total) by mouth daily., Disp: 30 tablet, Rfl: 11 .  Multiple Vitamin (MULTI-VITAMINS) TABS, Take by mouth., Disp: , Rfl:  .  omeprazole (PRILOSEC) 40 MG capsule, Take 1 capsule (40 mg total) by mouth daily., Disp: 30 capsule, Rfl: 12  Allergies  Allergen Reactions  . Penicillins Other (See Comments)     Review of Systems  Constitutional: Negative for fever, chills, weight loss and malaise/fatigue.  Eyes: Negative for blurred vision and double vision.  Respiratory: Negative for cough, shortness of breath and wheezing.   Cardiovascular: Negative for chest pain, palpitations and leg swelling.  Gastrointestinal: Negative for heartburn, abdominal pain and blood in stool.  Genitourinary: Negative for dysuria, urgency  and frequency.  Musculoskeletal: Negative for myalgias and joint pain.  Skin: Positive for rash. Negative for itching.  Neurological: Negative for weakness and headaches.      Objective  Filed Vitals:   03/12/15 0857  BP: 122/78  Pulse: 73  Temp: 98 F (36.7 C)  Resp: 16  Height: 5\' 5"  (1.651 m)  Weight: 216 lb 3.2 oz (98.068 kg)    Physical Exam  Constitutional: She is well-developed, well-nourished, and in no distress. No distress.  HENT:  Head: Normocephalic and atraumatic.  Cardiovascular: Normal rate, regular rhythm and normal heart sounds.  Exam reveals no gallop and no friction rub.   No murmur heard. Pulmonary/Chest: Effort normal and breath sounds  normal. No respiratory distress. She has no wheezes. She has no rales.  Skin:  Splotchy, red, flat, scaly rash on chest, upper back and on abd.  One herald patch close to L ant. shoulder  Vitals reviewed.      No results found for this or any previous visit (from the past 2160 hour(s)).   Assessment & Plan  Problem List Items Addressed This Visit      Musculoskeletal and Integument   Pityriasis rosea - Primary      No orders of the defined types were placed in this encounter.   1. Pityriasis rosea -patient reassured re: benign nature of rash.

## 2015-03-24 ENCOUNTER — Ambulatory Visit: Payer: Commercial Managed Care - HMO | Admitting: Family Medicine

## 2015-06-26 ENCOUNTER — Other Ambulatory Visit: Payer: Self-pay | Admitting: Family Medicine

## 2015-06-27 DIAGNOSIS — L111 Transient acantholytic dermatosis [Grover]: Secondary | ICD-10-CM | POA: Diagnosis not present

## 2015-07-01 ENCOUNTER — Encounter: Payer: Self-pay | Admitting: Family Medicine

## 2015-07-01 ENCOUNTER — Ambulatory Visit (INDEPENDENT_AMBULATORY_CARE_PROVIDER_SITE_OTHER): Payer: Medicare HMO | Admitting: Family Medicine

## 2015-07-01 VITALS — BP 114/74 | HR 69 | Temp 98.1°F | Resp 16 | Ht 65.0 in | Wt 216.6 lb

## 2015-07-01 DIAGNOSIS — R933 Abnormal findings on diagnostic imaging of other parts of digestive tract: Secondary | ICD-10-CM

## 2015-07-01 DIAGNOSIS — K449 Diaphragmatic hernia without obstruction or gangrene: Secondary | ICD-10-CM

## 2015-07-01 DIAGNOSIS — F329 Major depressive disorder, single episode, unspecified: Secondary | ICD-10-CM

## 2015-07-01 DIAGNOSIS — R198 Other specified symptoms and signs involving the digestive system and abdomen: Secondary | ICD-10-CM

## 2015-07-01 DIAGNOSIS — H40013 Open angle with borderline findings, low risk, bilateral: Secondary | ICD-10-CM | POA: Diagnosis not present

## 2015-07-01 DIAGNOSIS — H401130 Primary open-angle glaucoma, bilateral, stage unspecified: Secondary | ICD-10-CM | POA: Diagnosis not present

## 2015-07-01 DIAGNOSIS — F32A Depression, unspecified: Secondary | ICD-10-CM

## 2015-07-01 MED ORDER — ESCITALOPRAM OXALATE 10 MG PO TABS: ORAL_TABLET | ORAL | Status: DC

## 2015-07-01 MED ORDER — OMEPRAZOLE 40 MG PO CPDR: 40.0000 mg | DELAYED_RELEASE_CAPSULE | Freq: Two times a day (BID) | ORAL | Status: DC

## 2015-07-01 NOTE — Progress Notes (Signed)
Name: Kelly Weber   MRN: [id number]    DOB: [birth year]   Date:07/01/2015       Progress Note  Subjective  Chief Complaint  Chief Complaint  Patient presents with  . Depression    Follow up    HPI Here to f/u depression.  Says that she feels very well.  Feels 8-9/10 all the time.  Would like to decrease dose of Lexapro.  Doing just as well on 10 mg. As she did on 20 mg.    Has some reflux almost daily that causes burping and some cough.  Takes Omeprazole qd.  No problem-specific assessment & plan notes found for this encounter.   Past Medical History  Diagnosis Date  . Depression   . Insomnia 2006  . Anxiety   . Arthritis     fingers, left knee  . GERD (gastroesophageal reflux disease)   . Skin cancer 2007    Basal Cell CA resected from Left middle finger    Past Surgical History  Procedure Laterality Date  . Tubal ligation  1970  . Skin cancer excision Left     finger  . Colonoscopy with propofol N/A 11/20/2014    Procedure: COLONOSCOPY WITH PROPOFOL;  Surgeon: Lucilla Lame, MD;  Location: [place];  Service: Endoscopy;  Laterality: N/A;  . Esophagogastroduodenoscopy (egd) with propofol N/A 11/20/2014    Procedure: ESOPHAGOGASTRODUODENOSCOPY (EGD) WITH PROPOFOL;  Surgeon: Lucilla Lame, MD;  Location: [place];  Service: Endoscopy;  Laterality: N/A;  . Polypectomy  11/20/2014    Procedure: POLYPECTOMY;  Surgeon: Lucilla Lame, MD;  Location: [place];  Service: Endoscopy;;    Family History  Problem Relation Age of Onset  . Brain cancer Mother   . Thyroid disease Mother     Social History   Social History  . Marital Status: Married    Spouse Name: N/A  . Number of Children: N/A  . Years of Education: N/A   Occupational History  . Not on file.   Social History Main Topics  . Smoking status: Former Smoker -- 1.00 packs/day for 30 years    Types: Cigarettes    Quit date: 01/19/2014  . Smokeless tobacco: Never Used  . Alcohol  Use: No  . Drug Use: No  . Sexual Activity: Not on file   Other Topics Concern  . Not on file   Social History Narrative     Current outpatient prescriptions:  .  acetaminophen (TYLENOL) 500 MG tablet, Take 1-2 talbets every 8 hours as needed for joint pain., Disp: 100 tablet, Rfl: 0 .  Calcium Carb-Cholecalciferol (GNP CALCIUM 600 +D3 PO), Take by mouth., Disp: , Rfl:  .  escitalopram (LEXAPRO) 10 MG tablet, Take 1/2 tablet by mouth daily, Disp: 30 tablet, Rfl: 3 .  loratadine (CLARITIN) 10 MG tablet, Take 1 tablet (10 mg total) by mouth daily., Disp: 30 tablet, Rfl: 11 .  Multiple Vitamin (MULTI-VITAMINS) TABS, Take by mouth., Disp: , Rfl:  .  omeprazole (PRILOSEC) 40 MG capsule, Take 1 capsule (40 mg total) by mouth 2 (two) times daily., Disp: 60 capsule, Rfl: 12  Allergies  Allergen Reactions  . Penicillins Other (See Comments)     Review of Systems  Constitutional: Negative for fever, chills, weight loss and malaise/fatigue.  HENT: Negative for hearing loss.   Eyes: Negative for blurred vision and double vision.  Respiratory: Negative for cough, shortness of breath and wheezing.   Cardiovascular: Negative for chest pain, palpitations and leg  swelling.  Gastrointestinal: Positive for heartburn. Negative for abdominal pain and blood in stool.  Genitourinary: Negative for dysuria, urgency and frequency.  Musculoskeletal: Negative for myalgias and back pain.  Skin: Positive for rash (mild).  Neurological: Negative for dizziness, tremors, weakness and headaches.  Psychiatric/Behavioral: Negative for depression. The patient is not nervous/anxious and does not have insomnia.       Objective  Filed Vitals:   07/01/15 1312  BP: 114/74  Pulse: 69  Temp: 98.1 F (36.7 C)  TempSrc: Oral  Resp: 16  Height: 5\' 5"  (1.651 m)  Weight: 216 lb 9.6 oz (98.249 kg)    Physical Exam  Constitutional: She is oriented to person, place, and time and well-developed, well-nourished,  and in no distress. No distress.  HENT:  Head: Normocephalic and atraumatic.  Eyes: Conjunctivae and EOM are normal. Pupils are equal, round, and reactive to light. No scleral icterus.  Neck: Normal range of motion. Neck supple. No thyromegaly present.  Cardiovascular: Normal rate, regular rhythm and normal heart sounds.  Exam reveals no gallop and no friction rub.   No murmur heard. Pulmonary/Chest: Effort normal and breath sounds normal. No respiratory distress. She has no wheezes. She has no rales.  Abdominal: Soft. Bowel sounds are normal. She exhibits no distension and no mass. There is no tenderness.  Musculoskeletal: She exhibits no edema.  Lymphadenopathy:    She has no cervical adenopathy.  Neurological: She is alert and oriented to person, place, and time.  Psychiatric: Mood, memory, affect and judgment normal.  Vitals reviewed.      No results found for this or any previous visit (from the past 2160 hour(s)).   Assessment & Plan  Problem List Items Addressed This Visit      Respiratory   Hiatal hernia   Relevant Medications   omeprazole (PRILOSEC) 40 MG capsule     Other   Clinical depression - Primary   Relevant Medications   escitalopram (LEXAPRO) 10 MG tablet   Abnormal findings-gastrointestinal tract   Relevant Medications   omeprazole (PRILOSEC) 40 MG capsule      Meds ordered this encounter  Medications  . escitalopram (LEXAPRO) 10 MG tablet    Sig: Take 1/2 tablet by mouth daily    Dispense:  30 tablet    Refill:  3  . omeprazole (PRILOSEC) 40 MG capsule    Sig: Take 1 capsule (40 mg total) by mouth 2 (two) times daily.    Dispense:  60 capsule    Refill:  12   1. Clinical depression  - escitalopram (LEXAPRO) 10 MG tablet; Take 1/2 tablet by mouth daily  Dispense: 30 tablet; Refill: 3- decreased from 10 mg to 5 mg/d. RTC-2 months 2. Abnormal findings-gastrointestinal tract  - omeprazole (PRILOSEC) 40 MG capsule; Take 1 capsule (40 mg total)  by mouth 2 (two) times daily.  Dispense: 60 capsule; Refill: 12  3. Hiatal hernia  - omeprazole (PRILOSEC) 40 MG capsule; Take 1 capsule (40 mg total) by mouth 2 (two) times daily.  Dispense: 60 capsule; Refill: 12   Increased from 1 to 2 daily.

## 2015-08-18 ENCOUNTER — Other Ambulatory Visit: Payer: Self-pay

## 2015-08-18 DIAGNOSIS — Z1231 Encounter for screening mammogram for malignant neoplasm of breast: Secondary | ICD-10-CM

## 2015-08-26 ENCOUNTER — Encounter: Payer: Self-pay | Admitting: Family Medicine

## 2015-08-26 ENCOUNTER — Ambulatory Visit
Admission: RE | Admit: 2015-08-26 | Discharge: 2015-08-26 | Disposition: A | Discharge status: Home / Self Care | Payer: Medicare HMO | Source: Ambulatory Visit | Attending: Family Medicine | Admitting: Family Medicine

## 2015-08-26 ENCOUNTER — Ambulatory Visit: Admission: RE | Admit: 2015-08-26 | Payer: Medicare HMO | Source: Ambulatory Visit | Admitting: *Deleted

## 2015-08-26 ENCOUNTER — Ambulatory Visit (INDEPENDENT_AMBULATORY_CARE_PROVIDER_SITE_OTHER): Payer: Medicare HMO | Admitting: Family Medicine

## 2015-08-26 VITALS — BP 120/70 | HR 72 | Temp 97.2°F | Resp 16 | Ht 65.0 in | Wt 214.0 lb

## 2015-08-26 DIAGNOSIS — R0602 Shortness of breath: Secondary | ICD-10-CM

## 2015-08-26 DIAGNOSIS — R55 Syncope and collapse: Secondary | ICD-10-CM | POA: Diagnosis not present

## 2015-08-26 DIAGNOSIS — R8290 Unspecified abnormal findings in urine: Secondary | ICD-10-CM

## 2015-08-26 DIAGNOSIS — R05 Cough: Secondary | ICD-10-CM | POA: Diagnosis not present

## 2015-08-26 DIAGNOSIS — R42 Dizziness and giddiness: Secondary | ICD-10-CM | POA: Diagnosis not present

## 2015-08-26 DIAGNOSIS — N3001 Acute cystitis with hematuria: Secondary | ICD-10-CM

## 2015-08-26 LAB — POCT URINALYSIS DIPSTICK
Bilirubin, UA: NEGATIVE
GLUCOSE UA: NEGATIVE
Ketones, UA: NEGATIVE
NITRITE UA: NEGATIVE
Spec Grav, UA: 1.015
UROBILINOGEN UA: 0.2
pH, UA: 6

## 2015-08-26 MED ORDER — CIPROFLOXACIN HCL 500 MG PO TABS: 500.0000 mg | ORAL_TABLET | Freq: Two times a day (BID) | ORAL | Status: AC

## 2015-08-26 NOTE — Addendum Note (Signed)
Addended by: [name] Konig on: 08/26/2015 11:33 AM   Modules accepted: Orders

## 2015-08-26 NOTE — Progress Notes (Signed)
Name: Kelly Weber   MRN: [id number]    DOB: 11-17-1945   Date:08/26/2015       Progress Note  Subjective  Chief Complaint  Chief Complaint  Patient presents with  . Fall  . Shortness of Breath  . Loss of Consciousness    HPI Here with multiple c/o.  Main problem is SOB and dizziness and weakness with any activity.  Has passed out twice with walking ov er past month.  No CP or neck pain.  Just SOB.  [place] and hit R knee 1 week ago, but that is doing better.  C/o foul smelling urine  For several weeks to months. No problem-specific assessment & plan notes found for this encounter.   Past Medical History  Diagnosis Date  . Depression   . Insomnia 2006  . Anxiety   . Arthritis     fingers, left knee  . GERD (gastroesophageal reflux disease)   . Skin cancer 2007    Basal Cell CA resected from Left middle finger    Past Surgical History  Procedure Laterality Date  . Tubal ligation  1970  . Skin cancer excision Left     finger  . Colonoscopy with propofol N/A 11/20/2014    Procedure: COLONOSCOPY WITH PROPOFOL;  Surgeon: Lucilla Lame, MD;  Location: [place];  Service: Endoscopy;  Laterality: N/A;  . Esophagogastroduodenoscopy (egd) with propofol N/A 11/20/2014    Procedure: ESOPHAGOGASTRODUODENOSCOPY (EGD) WITH PROPOFOL;  Surgeon: Lucilla Lame, MD;  Location: [place];  Service: Endoscopy;  Laterality: N/A;  . Polypectomy  11/20/2014    Procedure: POLYPECTOMY;  Surgeon: Lucilla Lame, MD;  Location: [place];  Service: Endoscopy;;    Family History  Problem Relation Age of Onset  . Brain cancer Mother   . Thyroid disease Mother     Social History   Social History  . Marital Status: Married    Spouse Name: N/A  . Number of Children: N/A  . Years of Education: N/A   Occupational History  . Not on file.   Social History Main Topics  . Smoking status: Former Smoker -- 1.00 packs/day for 30 years    Types: Cigarettes    Quit date:  01/19/2014  . Smokeless tobacco: Never Used  . Alcohol Use: No  . Drug Use: No  . Sexual Activity: Not on file   Other Topics Concern  . Not on file   Social History Narrative     Current outpatient prescriptions:  .  acetaminophen (TYLENOL) 500 MG tablet, Take 1-2 talbets every 8 hours as needed for joint pain., Disp: 100 tablet, Rfl: 0 .  escitalopram (LEXAPRO) 10 MG tablet, Take 1/2 tablet by mouth daily, Disp: 30 tablet, Rfl: 3 .  loratadine (CLARITIN) 10 MG tablet, Take 1 tablet (10 mg total) by mouth daily., Disp: 30 tablet, Rfl: 11 .  Multiple Vitamin (MULTI-VITAMINS) TABS, Take by mouth., Disp: , Rfl:  .  omeprazole (PRILOSEC) 40 MG capsule, Take 1 capsule (40 mg total) by mouth 2 (two) times daily. (Patient taking differently: Take 40 mg by mouth daily. ), Disp: 60 capsule, Rfl: 12 .  triamcinolone cream (KENALOG) 0.1 %, Apply 1 application topically as needed., Disp: , Rfl:  .  Calcium Carb-Cholecalciferol (GNP CALCIUM 600 +D3 PO), Take by mouth. Reported on 08/26/2015, Disp: , Rfl:  .  ciprofloxacin (CIPRO) 500 MG tablet, Take 1 tablet (500 mg total) by mouth 2 (two) times daily., Disp: 14 tablet, Rfl: 0  Allergies  Allergen Reactions  . Penicillins Other (See Comments)     Review of Systems  Constitutional: Positive for malaise/fatigue. Negative for fever, chills and weight loss.  HENT: Negative for hearing loss.   Eyes: Negative for blurred vision and double vision.  Respiratory: Positive for shortness of breath. Negative for cough and wheezing.   Cardiovascular: Negative for chest pain, palpitations and leg swelling.  Gastrointestinal: Negative for heartburn, abdominal pain and blood in stool.       Foul smelling stool  Genitourinary: Negative for dysuria, urgency and frequency.       Foul smellling urine  Skin: Negative for rash.  Neurological: Positive for dizziness, loss of consciousness (with walkuing) and weakness. Negative for tremors and headaches.       Objective  Filed Vitals:   08/26/15 1000 08/26/15 1045  BP: 127/84 120/70  Pulse: 77 72  Temp: 97.2 F (36.2 C)   TempSrc: Oral   Resp: 16   Height: 5\' 5"  (1.651 m)   Weight: 214 lb (97.07 kg)   SpO2: 95%     Physical Exam  Constitutional: She is oriented to person, place, and time and well-developed, well-nourished, and in no distress. No distress.  HENT:  Head: Normocephalic and atraumatic.  Eyes: Conjunctivae and EOM are normal. Pupils are equal, round, and reactive to light. No scleral icterus.  Neck: Normal range of motion. Neck supple. Carotid bruit is not present. No thyromegaly present.  Cardiovascular: Normal rate, regular rhythm and normal heart sounds.  Exam reveals no gallop and no friction rub.   No murmur heard. Pulmonary/Chest: Effort normal and breath sounds normal. No respiratory distress. She has no wheezes. She has no rales.  Abdominal: Soft. Bowel sounds are normal. She exhibits no distension, no abdominal bruit and no mass. There is no tenderness.  No CVA tenderness  Musculoskeletal: She exhibits no edema.  Lymphadenopathy:    She has no cervical adenopathy.  Neurological: She is alert and oriented to person, place, and time.  Vitals reviewed.      Recent Results (from the past 2160 hour(s))  POCT Urinalysis Dipstick     Status: Abnormal   Collection Time: 08/26/15 10:35 AM  Result Value Ref Range   Color, UA amber    Clarity, UA cloudy    Glucose, UA neg    Bilirubin, UA neg    Ketones, UA neg    Spec Grav, UA 1.015    Blood, UA moderate    pH, UA 6.0    Protein, UA trace    Urobilinogen, UA 0.2    Nitrite, UA neg    Leukocytes, UA moderate (2+) (A) Negative     Assessment & Plan  Problem List Items Addressed This Visit      Cardiovascular and Mediastinum   Syncope and collapse   Relevant Orders   Comprehensive Metabolic Panel (CMET)   CBC with Differential   Lipid Profile   TSH   EKG 12-Lead   Ambulatory referral to  Cardiology     Genitourinary   Acute cystitis   Relevant Medications   ciprofloxacin (CIPRO) 500 MG tablet     Other   SOB (shortness of breath) on exertion   Relevant Orders   EKG 12-Lead   DG Chest 2 View   Ambulatory referral to Cardiology    Other Visit Diagnoses    Abnormal urine odor    -  Primary    Relevant Orders    POCT Urinalysis Dipstick (Completed)  Dizziness           Meds ordered this encounter  Medications  . DISCONTD: meloxicam (MOBIC) 15 MG tablet    Sig: Take 15 mg by mouth daily.  [name] Kitchen DISCONTD: zolpidem (AMBIEN) 10 MG tablet    Sig: Take 10 mg by mouth at bedtime.  . triamcinolone cream (KENALOG) 0.1 %    Sig: Apply 1 application topically as needed.  . ciprofloxacin (CIPRO) 500 MG tablet    Sig: Take 1 tablet (500 mg total) by mouth 2 (two) times daily.    Dispense:  14 tablet    Refill:  0   1. Abnormal urine odor  - POCT Urinalysis Dipstick  2. Dizziness    3. Syncope and collapse  - Comprehensive Metabolic Panel (CMET) - CBC with Differential - Lipid Profile - TSH - EKG 12-Lead - Ambulatory referral to Cardiology  4. SOB (shortness of breath) on exertion  - EKG 12-Lead - DG Chest 2 View; Future - Ambulatory referral to Cardiology  5. Acute cystitis with hematuria  - ciprofloxacin (CIPRO) 500 MG tablet; Take 1 tablet (500 mg total) by mouth 2 (two) times daily.  Dispense: 14 tablet; Refill: 0

## 2015-08-27 ENCOUNTER — Encounter (INDEPENDENT_AMBULATORY_CARE_PROVIDER_SITE_OTHER): Payer: Self-pay

## 2015-08-27 ENCOUNTER — Ambulatory Visit (INDEPENDENT_AMBULATORY_CARE_PROVIDER_SITE_OTHER): Payer: Medicare HMO | Admitting: Cardiovascular Disease

## 2015-08-27 ENCOUNTER — Encounter: Payer: Self-pay | Admitting: Cardiovascular Disease

## 2015-08-27 VITALS — BP 120/77 | HR 77 | Ht 65.0 in | Wt 214.0 lb

## 2015-08-27 DIAGNOSIS — R55 Syncope and collapse: Secondary | ICD-10-CM | POA: Diagnosis not present

## 2015-08-27 DIAGNOSIS — D649 Anemia, unspecified: Secondary | ICD-10-CM | POA: Diagnosis not present

## 2015-08-27 DIAGNOSIS — E66811 Obesity, class 1: Secondary | ICD-10-CM | POA: Insufficient documentation

## 2015-08-27 DIAGNOSIS — R000 Tachycardia, unspecified: Secondary | ICD-10-CM | POA: Diagnosis not present

## 2015-08-27 DIAGNOSIS — R0602 Shortness of breath: Secondary | ICD-10-CM

## 2015-08-27 DIAGNOSIS — J449 Chronic obstructive pulmonary disease, unspecified: Secondary | ICD-10-CM

## 2015-08-27 DIAGNOSIS — E669 Obesity, unspecified: Secondary | ICD-10-CM | POA: Insufficient documentation

## 2015-08-27 DIAGNOSIS — J432 Centrilobular emphysema: Secondary | ICD-10-CM

## 2015-08-27 NOTE — Assessment & Plan Note (Signed)
Strongly recommended she start a exercise program, dietary changes for weight loss Work on her conditioning

## 2015-08-27 NOTE — Progress Notes (Signed)
Patient ID: Kelly Weber, female    DOB: 1945/06/26, 70 y.o.   MRN: [id number]  HPI Comments: Ms. Kelly Weber is a pleasant 70 year old woman who presents by referral from Dr. Luan Pulling for evaluation of shortness of breath, dizziness, weakness, possible syncope.  Husband presents with her today She reports that she retired 2 or 3 years ago, stopped smoking around that time Since then she has become dramatically deconditioned. Weight has been trending upwards, has been reports that she sits on the couch all day, does not do anything. No physical activity. He has noticed increased shortness of breath particularly over the past year. Diet has been poor, drinks soda.  She reports having some joint pain right SI joint region, right knee. Chronic diarrhea issues typically made worse by stress.  Episode last Friday where she was walking off the plane after one hour flight, got inside the terminal, became progressively short of breath with exertion, fell down. She was behind her husband, fell onto her front, had to sit down for 2-3 minutes before she recovered and was able to stand up. She denies loss of consciousness, just felt very weak and legs did not support her.  Perhaps had one episode earlier in the month when she was walking at set down and recovered without incident.  After her shower, applies cream, feels very hot, diaphoretic. She does report one episode where she fell at work, hurt her right lower back.  She started smoking age 37, stopped 2 or 3 years ago CT scan of the chest showing mild COPD.  CT scan of abdomen and pelvis approximately 2 years ago. This was reviewed with her in detail, images pulled up in the office. This showed no significant PAD, including the distal RCA   Allergies  Allergen Reactions  . Penicillins Other (See Comments)    Current Outpatient Prescriptions on File Prior to Visit  Medication Sig Dispense Refill  . acetaminophen (TYLENOL) 500 MG tablet Take  1-2 talbets every 8 hours as needed for joint pain. 100 tablet 0  . Calcium Carb-Cholecalciferol (GNP CALCIUM 600 +D3 PO) Take by mouth. Reported on 08/26/2015    . ciprofloxacin (CIPRO) 500 MG tablet Take 1 tablet (500 mg total) by mouth 2 (two) times daily. 14 tablet 0  . escitalopram (LEXAPRO) 10 MG tablet Take 1/2 tablet by mouth daily 30 tablet 3  . loratadine (CLARITIN) 10 MG tablet Take 1 tablet (10 mg total) by mouth daily. (Patient taking differently: Take 10 mg by mouth daily as needed. ) 30 tablet 11  . Multiple Vitamin (MULTI-VITAMINS) TABS Take by mouth.    [name] Kitchen omeprazole (PRILOSEC) 40 MG capsule Take 1 capsule (40 mg total) by mouth 2 (two) times daily. (Patient taking differently: Take 40 mg by mouth daily. ) 60 capsule 12  . triamcinolone cream (KENALOG) 0.1 % Apply 1 application topically as needed.     No current facility-administered medications on file prior to visit.    Past Medical History  Diagnosis Date  . Depression   . Insomnia 2006  . Anxiety   . Arthritis     fingers, left knee  . GERD (gastroesophageal reflux disease)   . Skin cancer 2007    Basal Cell CA resected from Left middle finger    Past Surgical History  Procedure Laterality Date  . Tubal ligation  1970  . Skin cancer excision Left     finger  . Colonoscopy with propofol N/A 11/20/2014    Procedure: COLONOSCOPY WITH  PROPOFOL;  Surgeon: Lucilla Lame, MD;  Location: [place];  Service: Endoscopy;  Laterality: N/A;  . Esophagogastroduodenoscopy (egd) with propofol N/A 11/20/2014    Procedure: ESOPHAGOGASTRODUODENOSCOPY (EGD) WITH PROPOFOL;  Surgeon: Lucilla Lame, MD;  Location: [place];  Service: Endoscopy;  Laterality: N/A;  . Polypectomy  11/20/2014    Procedure: POLYPECTOMY;  Surgeon: Lucilla Lame, MD;  Location: [place];  Service: Endoscopy;;    Social History  reports that she quit smoking about 19 months ago. Her smoking use included Cigarettes. She has a 30  pack-year smoking history. She has never used smokeless tobacco. She reports that she does not drink alcohol or use illicit drugs.  Family History family history includes Brain cancer in her mother; Thyroid disease in her mother.   Review of Systems  Constitutional: Positive for fatigue.  Respiratory: Positive for shortness of breath.   Cardiovascular: Negative.   Gastrointestinal: Negative.   Musculoskeletal: Positive for gait problem.  Neurological: Positive for weakness and light-headedness.  Hematological: Negative.   Psychiatric/Behavioral: Negative.   All other systems reviewed and are negative.   BP 120/77 mmHg  Pulse 77  Ht 5\' 5"  (1.651 m)  Wt 214 lb (97.07 kg)  BMI 35.61 kg/m2  Physical Exam  Constitutional: She is oriented to person, place, and time. She appears well-developed and well-nourished.  Obese  HENT:  Head: Normocephalic.  Nose: Nose normal.  Mouth/Throat: Oropharynx is clear and moist.  Eyes: Conjunctivae are normal. Pupils are equal, round, and reactive to light.  Neck: Normal range of motion. Neck supple. No JVD present.  Cardiovascular: Normal rate, regular rhythm, normal heart sounds and intact distal pulses.  Exam reveals no gallop and no friction rub.   No murmur heard. Pulmonary/Chest: Effort normal and breath sounds normal. No respiratory distress. She has no wheezes. She has no rales. She exhibits no tenderness.  Abdominal: Soft. Bowel sounds are normal. She exhibits no distension. There is no tenderness.  Musculoskeletal: Normal range of motion. She exhibits no edema or tenderness.  Lymphadenopathy:    She has no cervical adenopathy.  Neurological: She is alert and oriented to person, place, and time. Coordination normal.  Skin: Skin is warm and dry. No rash noted. No erythema.  Psychiatric: She has a normal mood and affect. Her behavior is normal. Judgment and thought content normal.

## 2015-08-27 NOTE — Assessment & Plan Note (Signed)
Mild emphysema noted on prior CT scan Stop smoking several years ago No hypoxia on ambulation in the office today

## 2015-08-27 NOTE — Patient Instructions (Addendum)
You are doing well. No medication changes were made.  We will schedule an echocardiogram for shortness of breath, pass out Echocardiography is a painless test that uses sound waves to create images of your heart. It provides your doctor with information about the size and shape of your heart and how well your heart's chambers and valves are working. This procedure takes approximately one hour. There are no restrictions for this procedure.  Date & time: __________________  We will place a referral to pulmonary rehab  They will contact you with an appointment  Please call us if you have new issues that need to be addressed before your next appt.  Your physician wants you to follow-up in: 6 months.  You will receive a reminder letter in the mail two months in advance. If you don't receive a letter, please call our office to schedule the follow-up appointment.  Echocardiogram An echocardiogram, or echocardiography, uses sound waves (ultrasound) to produce an image of your heart. The echocardiogram is simple, painless, obtained within a short period of time, and offers valuable information to your health care provider. The images from an echocardiogram can provide information such as:  Evidence of coronary artery disease (CAD).  Heart size.  Heart muscle function.  Heart valve function.  Aneurysm detection.  Evidence of a past heart attack.  Fluid buildup around the heart.  Heart muscle thickening.  Assess heart valve function. LET [facility] CARE PROVIDER KNOW ABOUT:  Any allergies you have.  All medicines you are taking, including vitamins, herbs, eye drops, creams, and over-the-counter medicines.  Previous problems you or members of your family have had with the use of anesthetics.  Any blood disorders you have.  Previous surgeries you have had.  Medical conditions you have.  Possibility of pregnancy, if this applies. BEFORE THE PROCEDURE  No special preparation is  needed. Eat and drink normally.  PROCEDURE   In order to produce an image of your heart, gel will be applied to your chest and a wand-like tool (transducer) will be moved over your chest. The gel will help transmit the sound waves from the transducer. The sound waves will harmlessly bounce off your heart to allow the heart images to be captured in real-time motion. These images will then be recorded.  You may need an IV to receive a medicine that improves the quality of the pictures. AFTER THE PROCEDURE You may return to your normal schedule including diet, activities, and medicines, unless your health care provider tells you otherwise.   This information is not intended to replace advice given to you by your health care provider. Make sure you discuss any questions you have with your health care provider.   Document Released: 03/26/2000 Document Revised: 04/19/2014 Document Reviewed: 12/04/2012 Elsevier Interactive Patient Education [employer].

## 2015-08-27 NOTE — Assessment & Plan Note (Signed)
Normal EKG, unchanged from 2015 Echocardiogram ordered to rule out structural heart disease Benign clinical findings Suspect her symptoms are secondary to mild COPD, recent wake and dramatic deconditioning Plan as above   Total encounter time more than 45 minutes  Greater than 50% was spent in counseling and coordination of care with the patient

## 2015-08-27 NOTE — Assessment & Plan Note (Signed)
On further investigation into her recent fall, she had profound shortness of breath walking up the hallway out of plane, legs gave out from under her. This seems to be a worsening in chronic issue over the past year per her husband. I suspect she has become profoundly deconditioned with dramatic weight gain since her retirement. We did ambulate her around the office and she had good heart rate, good saturations, profoundly short of breath. Likely conditioning.  Long discussion about various treatment options.  Echocardiogram has been ordered to exclude pulmonary hypertension, estimated ejection fraction  We did evaluate CT scan of abdomen and pelvis including distal RCA region. There is truly little if any PAD noted extending from right coronary artery down to her femoral arteries. Would be low risk of underlying ischemia given this finding.  We have recommended she start pulmonary rehabilitation given her underlying COPD on CT scan and dramatic deconditioning. Recommended dietary changes, watching out on her soda and carbohydrate intake in an effort to lose weight

## 2015-08-28 LAB — COMPREHENSIVE METABOLIC PANEL WITH GFR
ALT: 13 [IU]/L (ref 0–32)
AST: 20 [IU]/L (ref 0–40)
Albumin/Globulin Ratio: 1.7 (ref 1.2–2.2)
Albumin: 3.8 g/dL (ref 3.5–4.8)
Alkaline Phosphatase: 77 [IU]/L (ref 39–117)
BUN/Creatinine Ratio: 11 — ABNORMAL LOW (ref 12–28)
BUN: 9 mg/dL (ref 8–27)
Bilirubin Total: 0.2 mg/dL (ref 0.0–1.2)
CO2: 25 mmol/L (ref 18–29)
Calcium: 8.5 mg/dL — ABNORMAL LOW (ref 8.7–10.3)
Chloride: 103 mmol/L (ref 96–106)
Creatinine, Ser: 0.85 mg/dL (ref 0.57–1.00)
GFR calc Af Amer: 80 mL/min/{1.73_m2}
GFR calc non Af Amer: 70 mL/min/{1.73_m2}
Globulin, Total: 2.2 g/dL (ref 1.5–4.5)
Glucose: 93 mg/dL (ref 65–99)
Potassium: 4.3 mmol/L (ref 3.5–5.2)
Sodium: 142 mmol/L (ref 134–144)
Total Protein: 6 g/dL (ref 6.0–8.5)

## 2015-08-28 LAB — CBC WITH DIFFERENTIAL/PLATELET
Basophils Absolute: 0 10*3/uL (ref 0.0–0.2)
Basos: 0 %
EOS (ABSOLUTE): 0.1 10*3/uL (ref 0.0–0.4)
Eos: 3 %
Hematocrit: 28.3 % — ABNORMAL LOW (ref 34.0–46.6)
Hemoglobin: 8.8 g/dL — ABNORMAL LOW (ref 11.1–15.9)
Immature Grans (Abs): 0 10*3/uL (ref 0.0–0.1)
Immature Granulocytes: 0 %
Lymphocytes Absolute: 1.7 10*3/uL (ref 0.7–3.1)
Lymphs: 35 %
MCH: 22.8 pg — ABNORMAL LOW (ref 26.6–33.0)
MCHC: 31.1 g/dL — ABNORMAL LOW (ref 31.5–35.7)
MCV: 73 fL — ABNORMAL LOW (ref 79–97)
Monocytes Absolute: 0.5 10*3/uL (ref 0.1–0.9)
Monocytes: 10 %
Neutrophils Absolute: 2.5 10*3/uL (ref 1.4–7.0)
Neutrophils: 52 %
Platelets: 385 10*3/uL — ABNORMAL HIGH (ref 150–379)
RBC: 3.86 x10E6/uL (ref 3.77–5.28)
RDW: 17.1 % — ABNORMAL HIGH (ref 12.3–15.4)
WBC: 4.8 10*3/uL (ref 3.4–10.8)

## 2015-08-28 LAB — URINE CULTURE

## 2015-08-28 LAB — LIPID PANEL
Chol/HDL Ratio: 3.1 ratio (ref 0.0–4.4)
Cholesterol, Total: 160 mg/dL (ref 100–199)
HDL: 52 mg/dL
LDL Calculated: 89 mg/dL (ref 0–99)
Triglycerides: 97 mg/dL (ref 0–149)
VLDL Cholesterol Cal: 19 mg/dL (ref 5–40)

## 2015-08-28 LAB — TSH: TSH: 2.27 u[IU]/mL (ref 0.450–4.500)

## 2015-08-29 ENCOUNTER — Ambulatory Visit
Admission: RE | Admit: 2015-08-29 | Discharge: 2015-08-29 | Disposition: A | Discharge status: Home / Self Care | Payer: Medicare HMO | Source: Ambulatory Visit

## 2015-08-29 DIAGNOSIS — Z1231 Encounter for screening mammogram for malignant neoplasm of breast: Secondary | ICD-10-CM

## 2015-08-30 LAB — IRON AND TIBC
Iron Saturation: 5 % — CL (ref 15–55)
Iron: 18 ug/dL — ABNORMAL LOW (ref 27–139)
TIBC: 362 ug/dL (ref 250–450)
UIBC: 344 ug/dL (ref 118–369)

## 2015-08-30 LAB — FERRITIN: Ferritin: 17 ng/mL (ref 15–150)

## 2015-08-30 LAB — SPECIMEN STATUS REPORT

## 2015-09-02 ENCOUNTER — Ambulatory Visit: Payer: Commercial Managed Care - HMO | Admitting: Family Medicine

## 2015-09-02 ENCOUNTER — Other Ambulatory Visit (INDEPENDENT_AMBULATORY_CARE_PROVIDER_SITE_OTHER): Payer: Medicare HMO | Admitting: Family Medicine

## 2015-09-02 DIAGNOSIS — D509 Iron deficiency anemia, unspecified: Secondary | ICD-10-CM

## 2015-09-02 DIAGNOSIS — E611 Iron deficiency: Secondary | ICD-10-CM

## 2015-09-02 LAB — HEMOCCULT GUIAC POC 1CARD (OFFICE)
Card #2 Fecal Occult Blod, POC: NEGATIVE
Card #3 Fecal Occult Blood, POC: NEGATIVE
FECAL OCCULT BLD: NEGATIVE

## 2015-09-03 ENCOUNTER — Ambulatory Visit: Payer: Commercial Managed Care - HMO | Admitting: Cardiology

## 2015-09-03 NOTE — Addendum Note (Signed)
Addended by: [name] Konig on: 09/03/2015 08:22 AM   Modules accepted: Orders

## 2015-09-04 ENCOUNTER — Other Ambulatory Visit: Payer: Self-pay

## 2015-09-04 ENCOUNTER — Ambulatory Visit (INDEPENDENT_AMBULATORY_CARE_PROVIDER_SITE_OTHER): Payer: Medicare HMO

## 2015-09-04 DIAGNOSIS — R000 Tachycardia, unspecified: Secondary | ICD-10-CM

## 2015-09-04 DIAGNOSIS — R55 Syncope and collapse: Secondary | ICD-10-CM

## 2015-09-04 DIAGNOSIS — R0602 Shortness of breath: Secondary | ICD-10-CM

## 2015-09-12 ENCOUNTER — Other Ambulatory Visit: Payer: Self-pay | Admitting: Family Medicine

## 2015-09-12 DIAGNOSIS — N3090 Cystitis, unspecified without hematuria: Secondary | ICD-10-CM | POA: Diagnosis not present

## 2015-09-14 LAB — URINE CULTURE

## 2015-09-16 ENCOUNTER — Other Ambulatory Visit: Payer: Self-pay | Admitting: Family Medicine

## 2015-09-16 ENCOUNTER — Other Ambulatory Visit: Payer: Self-pay | Admitting: *Deleted

## 2015-09-16 DIAGNOSIS — N390 Urinary tract infection, site not specified: Secondary | ICD-10-CM | POA: Diagnosis not present

## 2015-09-18 LAB — URINE CULTURE: Colony Count: 40000

## 2015-09-29 ENCOUNTER — Ambulatory Visit (INDEPENDENT_AMBULATORY_CARE_PROVIDER_SITE_OTHER): Payer: Medicare HMO | Admitting: Family Medicine

## 2015-09-29 ENCOUNTER — Encounter: Payer: Self-pay | Admitting: Family Medicine

## 2015-09-29 VITALS — BP 118/70 | HR 60 | Temp 98.9°F | Resp 16 | Ht 65.0 in | Wt 211.0 lb

## 2015-09-29 DIAGNOSIS — D509 Iron deficiency anemia, unspecified: Secondary | ICD-10-CM

## 2015-09-29 DIAGNOSIS — G4701 Insomnia due to medical condition: Secondary | ICD-10-CM | POA: Diagnosis not present

## 2015-09-29 DIAGNOSIS — D649 Anemia, unspecified: Secondary | ICD-10-CM | POA: Insufficient documentation

## 2015-09-29 LAB — CBC WITH DIFFERENTIAL/PLATELET
BASOS PCT: 1 %
Basophils Absolute: 65 cells/uL (ref 0–200)
EOS ABS: 130 {cells}/uL (ref 15–500)
Eosinophils Relative: 2 %
HEMATOCRIT: 29.8 % — AB (ref 35.0–45.0)
HEMOGLOBIN: 8.9 g/dL — AB (ref 11.7–15.5)
LYMPHS ABS: 2730 {cells}/uL (ref 850–3900)
LYMPHS PCT: 42 %
MCH: 22 pg — ABNORMAL LOW (ref 27.0–33.0)
MCHC: 29.9 g/dL — ABNORMAL LOW (ref 32.0–36.0)
MCV: 73.8 fL — AB (ref 80.0–100.0)
MONO ABS: 455 {cells}/uL (ref 200–950)
MPV: 9.4 fL (ref 7.5–12.5)
Monocytes Relative: 7 %
Neutro Abs: 3120 cells/uL (ref 1500–7800)
Neutrophils Relative %: 48 %
Platelets: 335 10*3/uL (ref 140–400)
RBC: 4.04 MIL/uL (ref 3.80–5.10)
RDW: 17 % — AB (ref 11.0–15.0)
WBC: 6.5 10*3/uL (ref 3.8–10.8)

## 2015-09-29 NOTE — Progress Notes (Signed)
Name: Kelly Weber   MRN: [id number]    DOB: February 06, 1946   Date:09/29/2015       Progress Note  Subjective  Chief Complaint  Chief Complaint  Patient presents with  . Follow-up    Patient has appt with Dr. Allen Norris on 10/02/15    HPI Here for f/u of depression/anxiety.  She is doing well on 1/2 of 10 mg Lexapro. And says that she is sleeping well and handling stress ok.  She still c/o gas and abd distention and some pain occ.  She sees Dr. Allen Norris later this wek.  See \\saw  Dr. Rockey Situ last month and given a clean card. bill of health.  No problem-specific assessment & plan notes found for this encounter.   Past Medical History  Diagnosis Date  . Depression   . Insomnia 2006  . Anxiety   . Arthritis     fingers, left knee  . GERD (gastroesophageal reflux disease)   . Skin cancer 2007    Basal Cell CA resected from Left middle finger    Past Surgical History  Procedure Laterality Date  . Tubal ligation  1970  . Skin cancer excision Left     finger  . Colonoscopy with propofol N/A 11/20/2014    Procedure: COLONOSCOPY WITH PROPOFOL;  Surgeon: Lucilla Lame, MD;  Location: [facility];  Service: Endoscopy;  Laterality: N/A;  . Esophagogastroduodenoscopy (egd) with propofol N/A 11/20/2014    Procedure: ESOPHAGOGASTRODUODENOSCOPY (EGD) WITH PROPOFOL;  Surgeon: Lucilla Lame, MD;  Location: [facility];  Service: Endoscopy;  Laterality: N/A;  . Polypectomy  11/20/2014    Procedure: POLYPECTOMY;  Surgeon: Lucilla Lame, MD;  Location: [facility];  Service: Endoscopy;;    Family History  Problem Relation Age of Onset  . Brain cancer Mother   . Thyroid disease Mother     Social History   Social History  . Marital Status: Married    Spouse Name: N/A  . Number of Children: N/A  . Years of Education: N/A   Occupational History  . Not on file.   Social History Main Topics  . Smoking status: Former Smoker -- 1.00 packs/day for 30 years    Types: Cigarettes    Quit  date: 01/19/2014  . Smokeless tobacco: Never Used  . Alcohol Use: No  . Drug Use: No  . Sexual Activity: Not on file   Other Topics Concern  . Not on file   Social History Narrative     Current outpatient prescriptions:  .  acetaminophen (TYLENOL) 500 MG tablet, Take 1-2 talbets every 8 hours as needed for joint pain., Disp: 100 tablet, Rfl: 0 .  Calcium Carb-Cholecalciferol (GNP CALCIUM 600 +D3 PO), Take by mouth. Reported on 08/26/2015, Disp: , Rfl:  .  escitalopram (LEXAPRO) 10 MG tablet, Take 1/2 tablet by mouth daily, Disp: 30 tablet, Rfl: 3 .  Multiple Vitamin (MULTI-VITAMINS) TABS, Take by mouth., Disp: , Rfl:  .  omeprazole (PRILOSEC) 40 MG capsule, Take 1 capsule (40 mg total) by mouth 2 (two) times daily. (Patient taking differently: Take 40 mg by mouth daily. ), Disp: 60 capsule, Rfl: 12 .  triamcinolone cream (KENALOG) 0.1 %, Apply 1 application topically as needed., Disp: , Rfl:   Allergies  Allergen Reactions  . Penicillins Other (See Comments)     Review of Systems  Constitutional: Positive for weight loss (desired). Negative for fever, chills and malaise/fatigue.  HENT: Negative for hearing loss.   Eyes: Negative for blurred vision  and double vision.  Respiratory: Negative for cough, shortness of breath and wheezing.   Cardiovascular: Negative for chest pain, palpitations and leg swelling.  Gastrointestinal: Negative for heartburn, abdominal pain and blood in stool.       Some bloating and foul-smelling stools and gas.  Genitourinary: Negative for dysuria, urgency and frequency.  Skin: Negative for rash.  Neurological: Negative for dizziness, tremors, weakness and headaches.  Psychiatric/Behavioral: Negative for depression. The patient is not nervous/anxious and does not have insomnia.       Objective  Filed Vitals:   09/29/15 1552  BP: 118/70  Pulse: 60  Temp: 98.9 F (37.2 C)  TempSrc: Oral  Resp: 16  Height: 5\' 5"  (1.651 m)  Weight: 211 lb  (95.709 kg)    Physical Exam  Constitutional: She is oriented to person, place, and time and well-developed, well-nourished, and in no distress. No distress.  HENT:  Head: Normocephalic and atraumatic.  Eyes: Conjunctivae and EOM are normal. Pupils are equal, round, and reactive to light. No scleral icterus.  Neck: Normal range of motion. Neck supple. Carotid bruit is not present. No thyromegaly present.  Cardiovascular: Normal rate, regular rhythm and normal heart sounds.  Exam reveals no gallop and no friction rub.   No murmur heard. Pulmonary/Chest: Effort normal and breath sounds normal. No respiratory distress. She has no wheezes. She has no rales.  Abdominal: Soft. Bowel sounds are normal. She exhibits no distension, no abdominal bruit and no mass. There is no tenderness.  Musculoskeletal: She exhibits no edema.  Lymphadenopathy:    She has no cervical adenopathy.  Neurological: She is alert and oriented to person, place, and time.  Psychiatric: Mood, memory, affect and judgment normal.  Vitals reviewed.      Recent Results (from the past 2160 hour(s))  Urine Culture     Status: Abnormal   Collection Time: 08/26/15 12:00 AM  Result Value Ref Range   Urine Culture, Routine Final report (A)    Urine Culture result 1 Escherichia coli (A)     Comment: Greater than 100,000 colony forming units per mL   RESULT 2 Klebsiella pneumoniae (A)     Comment: 50,000-100,000 colony forming units per mL   ANTIMICROBIAL SUSCEPTIBILITY Comment     Comment:       ** S = Susceptible; I = Intermediate; R = Resistant **                    P = Positive; N = Negative             MICS are expressed in micrograms per mL    Antibiotic                 RSLT#1    RSLT#2    RSLT#3    RSLT#4 Amoxicillin/Clavulanic Acid    S         S Ampicillin                     S         R Cefepime                       S         S Ceftriaxone                    S         S Cefuroxime  S          S Cephalothin                    S         S Ciprofloxacin                  S         S Ertapenem                      S         S Gentamicin                     S         S Imipenem                       S         S Levofloxacin                   S         S Nitrofurantoin                 S         S Piperacillin                   S         R Tetracycline                   S         R Tobramycin                     S         S Trimethoprim/Sulfa             S         S   POCT Urinalysis Dipstick     Status: Abnormal   Collection Time: 08/26/15 10:35 AM  Result Value Ref Range   Color, UA amber    Clarity, UA cloudy    Glucose, UA neg    Bilirubin, UA neg    Ketones, UA neg    Spec Grav, UA 1.015    Blood, UA moderate    pH, UA 6.0    Protein, UA trace    Urobilinogen, UA 0.2    Nitrite, UA neg    Leukocytes, UA moderate (2+) (A) Negative  Comprehensive Metabolic Panel (CMET)     Status: Abnormal   Collection Time: 08/27/15  9:55 AM  Result Value Ref Range   Glucose 93 65 - 99 mg/dL   BUN 9 8 - 27 mg/dL   Creatinine, Ser 0.85 0.57 - 1.00 mg/dL   GFR calc non Af Amer 70 >59 mL/min/1.73   GFR calc Af Amer 80 >59 mL/min/1.73   BUN/Creatinine Ratio 11 (L) 12 - 28   Sodium 142 134 - 144 mmol/L   Potassium 4.3 3.5 - 5.2 mmol/L   Chloride 103 96 - 106 mmol/L   CO2 25 18 - 29 mmol/L   Calcium 8.5 (L) 8.7 - 10.3 mg/dL   Total Protein 6.0 6.0 - 8.5 g/dL   Albumin 3.8 3.5 - 4.8 g/dL   Globulin, Total 2.2 1.5 - 4.5 g/dL   Albumin/Globulin Ratio 1.7 1.2 - 2.2   Bilirubin Total 0.2 0.0 - 1.2 mg/dL   Alkaline Phosphatase 77 39 - 117  IU/L   AST 20 0 - 40 IU/L   ALT 13 0 - 32 IU/L  CBC with Differential     Status: Abnormal   Collection Time: 08/27/15  9:55 AM  Result Value Ref Range   WBC 4.8 3.4 - 10.8 x10E3/uL   RBC 3.86 3.77 - 5.28 x10E6/uL   Hemoglobin 8.8 (L) 11.1 - 15.9 g/dL   Hematocrit 28.3 (L) 34.0 - 46.6 %   MCV 73 (L) 79 - 97 fL   MCH 22.8 (L) 26.6 - 33.0 pg    MCHC 31.1 (L) 31.5 - 35.7 g/dL   RDW 17.1 (H) 12.3 - 15.4 %   Platelets 385 (H) 150 - 379 x10E3/uL   Neutrophils 52 %   Lymphs 35 %   Monocytes 10 %   Eos 3 %   Basos 0 %   Neutrophils Absolute 2.5 1.4 - 7.0 x10E3/uL   Lymphocytes Absolute 1.7 0.7 - 3.1 x10E3/uL   Monocytes Absolute 0.5 0.1 - 0.9 x10E3/uL   EOS (ABSOLUTE) 0.1 0.0 - 0.4 x10E3/uL   Basophils Absolute 0.0 0.0 - 0.2 x10E3/uL   Immature Granulocytes 0 %   Immature Grans (Abs) 0.0 0.0 - 0.1 x10E3/uL  Lipid Profile     Status: None   Collection Time: 08/27/15  9:55 AM  Result Value Ref Range   Cholesterol, Total 160 100 - 199 mg/dL   Triglycerides 97 0 - 149 mg/dL   HDL 52 >39 mg/dL   VLDL Cholesterol Cal 19 5 - 40 mg/dL   LDL Calculated 89 0 - 99 mg/dL   Chol/HDL Ratio 3.1 0.0 - 4.4 ratio units    Comment:                                   T. Chol/HDL Ratio                                             Men  Women                               1/2 Avg.Risk  3.4    3.3                                   Avg.Risk  5.0    4.4                                2X Avg.Risk  9.6    7.1                                3X Avg.Risk 23.4   11.0   TSH     Status: None   Collection Time: 08/27/15  9:55 AM  Result Value Ref Range   TSH 2.270 0.450 - 4.500 uIU/mL  Iron and TIBC     Status: Abnormal   Collection Time: 08/27/15  9:55 AM  Result Value Ref Range   Total Iron Binding Capacity 362 250 - 450 ug/dL   UIBC 344 118 - 369 ug/dL   Iron 18 (L)  27 - 139 ug/dL   Iron Saturation 5 (LL) 15 - 55 %  Ferritin     Status: None   Collection Time: 08/27/15  9:55 AM  Result Value Ref Range   Ferritin 17 15 - 150 ng/mL  Specimen status report     Status: None   Collection Time: 08/27/15  9:55 AM  Result Value Ref Range   specimen status report Comment     Comment: Written Authorization Written Authorization Written Authorization Received. Authorization received from [facility] 08-30-2015 Logged by Vista Lawman   POCT Occult  Blood Stool     Status: Normal   Collection Time: 09/02/15 11:46 AM  Result Value Ref Range   Fecal Occult Blood, POC Negative Negative   Card #1 Date 09/01/15    Card #2 Fecal Occult Blod, POC Negative    Card #2 Date 09/01/15    Card #3 Fecal Occult Blood, POC Negative    Card #3 Date 09/02/15   Urine Culture     Status: None   Collection Time: 09/12/15 10:18 AM  Result Value Ref Range   Colony Count 20,OOO COLONIES/ML    Organism ID, Bacteria Multiple bacterial morphotypes present, none    Organism ID, Bacteria predominant. Suggest appropriate recollection if     Organism ID, Bacteria clinically indicated.   Urine Culture     Status: None   Collection Time: 09/16/15  3:17 PM  Result Value Ref Range   Colony Count 40,000 COLONIES/ML    Organism ID, Bacteria Multiple bacterial morphotypes present, none    Organism ID, Bacteria predominant. Suggest appropriate recollection if     Organism ID, Bacteria clinically indicated.      Assessment & Plan  Problem List Items Addressed This Visit      Other   Insomnia due to medical condition   Anemia - Primary   Relevant Orders   CBC with Differential      No orders of the defined types were placed in this encounter.   1. Iron deficiency anemia See Dr. Allen Norris - CBC with Differential  2. Insomnia due to medical condition Cont. Lexapro

## 2015-10-02 ENCOUNTER — Ambulatory Visit (INDEPENDENT_AMBULATORY_CARE_PROVIDER_SITE_OTHER): Payer: Medicare HMO | Admitting: Gastroenterology

## 2015-10-02 ENCOUNTER — Encounter: Payer: Self-pay | Admitting: Gastroenterology

## 2015-10-02 VITALS — BP 130/64 | HR 77 | Temp 98.3°F | Ht 65.0 in | Wt 212.0 lb

## 2015-10-02 DIAGNOSIS — D649 Anemia, unspecified: Secondary | ICD-10-CM | POA: Diagnosis not present

## 2015-10-02 NOTE — Progress Notes (Signed)
Primary Care Physician: Dicky Doe, MD  Primary Gastroenterologist:  Dr. Lucilla Lame  Chief Complaint  Patient presents with  . Anemia    HPI: Kelly Weber is a 70 y.o. female here for continued anemia.  The patient has had a low MCV and low iron studies.  The patient had an EGD and colonoscopy in evaluation for her anemia in the past.  These did not show any source of active bleeding.  The patient recently had 3 stool cards sent off.  On questioning the patient states that each card was taken at different times.  All of her stool cards were negative for any blood. The patient has seen ENT in the past for recurrent nosebleeds.  The husband reports that the nosebleeds are quite severe when it happened.  Current Outpatient Prescriptions  Medication Sig Dispense Refill  . acetaminophen (TYLENOL) 500 MG tablet Take 1-2 talbets every 8 hours as needed for joint pain. 100 tablet 0  . Calcium Carb-Cholecalciferol (GNP CALCIUM 600 +D3 PO) Take by mouth. Reported on 08/26/2015    . escitalopram (LEXAPRO) 10 MG tablet Take 1/2 tablet by mouth daily 30 tablet 3  . Multiple Vitamin (MULTI-VITAMINS) TABS Take by mouth.    [name] Kitchen omeprazole (PRILOSEC) 40 MG capsule Take 1 capsule (40 mg total) by mouth 2 (two) times daily. (Patient taking differently: Take 40 mg by mouth daily. ) 60 capsule 12  . triamcinolone cream (KENALOG) 0.1 % Apply 1 application topically as needed.     No current facility-administered medications for this visit.    Allergies as of 10/02/2015 - Review Complete 10/02/2015  Allergen Reaction Noted  . Penicillins Other (See Comments) 09/23/2014    ROS:  General: Negative for anorexia, weight loss, fever, chills, fatigue, weakness. ENT: Negative for hoarseness, difficulty swallowing , nasal congestion. CV: Negative for chest pain, angina, palpitations, dyspnea on exertion, peripheral edema.  Respiratory: Negative for dyspnea at rest, dyspnea on exertion, cough, sputum,  wheezing.  GI: See history of present illness. GU:  Negative for dysuria, hematuria, urinary incontinence, urinary frequency, nocturnal urination.  Endo: Negative for unusual weight change.    Physical Examination:   BP 130/64 mmHg  Pulse 77  Temp(Src) 98.3 F (36.8 C) (Oral)  Ht 5\' 5"  (1.651 m)  Wt 212 lb (96.163 kg)  BMI 35.28 kg/m2  General: Well-nourished, well-developed in no acute distress.  Eyes: No icterus. Conjunctivae pink. Mouth: Oropharyngeal mucosa moist and pink , no lesions erythema or exudate. Lungs: Clear to auscultation bilaterally. Non-labored. Heart: Regular rate and rhythm, no murmurs rubs or gallops.  Abdomen: Bowel sounds are normal, nontender, nondistended, no hepatosplenomegaly or masses, no abdominal bruits or hernia , no rebound or guarding.   Extremities: No lower extremity edema. No clubbing or deformities. Neuro: Alert and oriented x 3.  Grossly intact. Skin: Warm and dry, no jaundice.   Psych: Alert and cooperative, normal mood and affect.  Labs:    Imaging Studies: No results found.  Assessment and Plan:   Kelly Weber is a 70 y.o. y/o female Who comes in today with iron deficiency anemia with stool cards being negative for any blood on 3 separate occasions.  The patient has had a EGD and colonoscopy that did not show any source of active bleeding.  The patient does have  profuse nosebleeds rather frequently.  The patient has been told to restart her iron and she should follow up with ENT because of her recurrent nosebleeds.  The patient  was also told that we will check her hemoglobin in 2 weeks to see if the iron is helping her.  A GI bleed is unlikely with her normal EGD and colonoscopy with her heme negative stools.  It may be that the patient is having iron malabsorption and she will have her blood checked for celiac sprue as a possible cause.  Further recommendations will be pending her next blood test.  The patient and her husband have been  explained the plan and agree with it.   Note: This dictation was prepared with Dragon dictation along with smaller phrase technology. Any transcriptional errors that result from this process are unintentional.

## 2015-10-03 DIAGNOSIS — R69 Illness, unspecified: Secondary | ICD-10-CM | POA: Diagnosis not present

## 2015-10-09 DIAGNOSIS — R040 Epistaxis: Secondary | ICD-10-CM | POA: Diagnosis not present

## 2015-10-16 DIAGNOSIS — D649 Anemia, unspecified: Secondary | ICD-10-CM | POA: Diagnosis not present

## 2015-10-16 LAB — CBC WITH DIFFERENTIAL/PLATELET
BASOS: 0 %
Basophils Absolute: 0 10*3/uL (ref 0.0–0.2)
EOS (ABSOLUTE): 0.1 10*3/uL (ref 0.0–0.4)
Eos: 2 %
HEMATOCRIT: 29.5 % — AB (ref 34.0–46.6)
HEMOGLOBIN: 9.4 g/dL — AB (ref 11.1–15.9)
Immature Grans (Abs): 0 10*3/uL (ref 0.0–0.1)
Immature Granulocytes: 0 %
LYMPHS ABS: 2.1 10*3/uL (ref 0.7–3.1)
Lymphs: 42 %
MCH: 23.3 pg — AB (ref 26.6–33.0)
MCHC: 31.9 g/dL (ref 31.5–35.7)
MCV: 73 fL — ABNORMAL LOW (ref 79–97)
MONOS ABS: 0.4 10*3/uL (ref 0.1–0.9)
Monocytes: 8 %
NEUTROS ABS: 2.4 10*3/uL (ref 1.4–7.0)
NEUTROS PCT: 48 %
Platelets: 409 10*3/uL — ABNORMAL HIGH (ref 150–379)
RBC: 4.04 x10E6/uL (ref 3.77–5.28)
RDW: 18.4 % — AB (ref 12.3–15.4)
WBC: 4.9 10*3/uL (ref 3.4–10.8)

## 2015-10-21 ENCOUNTER — Other Ambulatory Visit: Payer: Self-pay

## 2015-10-21 ENCOUNTER — Telehealth: Payer: Self-pay | Admitting: Gastroenterology

## 2015-10-21 DIAGNOSIS — D649 Anemia, unspecified: Secondary | ICD-10-CM

## 2015-10-21 NOTE — Addendum Note (Signed)
Addended by: [name] Konig on: 10/21/2015 11:56 AM   Modules accepted: Orders

## 2015-10-21 NOTE — Telephone Encounter (Signed)
-----   Message from Lucilla Lame, MD sent at 10/20/2015 11:30 AM EDT ----- Please let the patient know that her blood count is slightly higher. The patient should have her recurrent nosebleeds checked by ENT. If the nosebleed stops and her hemoglobin continues to be low then a capsule endoscopy may be needed otherwise if the blood count comes back to normal after the recurrent nosebleed stops it was likely the nosebleeds as the cause of her iron deficiency anemia.

## 2015-10-21 NOTE — Telephone Encounter (Signed)
Pt notified of results. Pt stated her nosebleed issue has been taken care of. We will repeat CBC in 2 weeks. If blood count is still low, we will set pt up for a capsule endoscopy. Pt verbalizes understanding of these instructions.

## 2015-10-21 NOTE — Telephone Encounter (Signed)
Patient is waiting on results

## 2015-10-22 DIAGNOSIS — L538 Other specified erythematous conditions: Secondary | ICD-10-CM | POA: Diagnosis not present

## 2015-10-22 DIAGNOSIS — Z85828 Personal history of other malignant neoplasm of skin: Secondary | ICD-10-CM | POA: Diagnosis not present

## 2015-10-22 DIAGNOSIS — D225 Melanocytic nevi of trunk: Secondary | ICD-10-CM | POA: Diagnosis not present

## 2015-10-22 DIAGNOSIS — L820 Inflamed seborrheic keratosis: Secondary | ICD-10-CM | POA: Diagnosis not present

## 2015-10-22 DIAGNOSIS — D2272 Melanocytic nevi of left lower limb, including hip: Secondary | ICD-10-CM | POA: Diagnosis not present

## 2015-10-22 DIAGNOSIS — D2261 Melanocytic nevi of right upper limb, including shoulder: Secondary | ICD-10-CM | POA: Diagnosis not present

## 2015-11-21 DIAGNOSIS — D649 Anemia, unspecified: Secondary | ICD-10-CM | POA: Diagnosis not present

## 2015-11-21 LAB — CBC WITH DIFFERENTIAL/PLATELET
BASOS ABS: 0 10*3/uL (ref 0.0–0.2)
Basos: 0 %
EOS (ABSOLUTE): 0.1 10*3/uL (ref 0.0–0.4)
Eos: 2 %
HEMOGLOBIN: 10.3 g/dL — AB (ref 11.1–15.9)
Hematocrit: 34.1 % (ref 34.0–46.6)
IMMATURE GRANS (ABS): 0 10*3/uL (ref 0.0–0.1)
IMMATURE GRANULOCYTES: 0 %
LYMPHS: 38 %
Lymphocytes Absolute: 2.1 10*3/uL (ref 0.7–3.1)
MCH: 24.1 pg — AB (ref 26.6–33.0)
MCHC: 30.2 g/dL — ABNORMAL LOW (ref 31.5–35.7)
MCV: 80 fL (ref 79–97)
MONOCYTES: 6 %
Monocytes Absolute: 0.3 10*3/uL (ref 0.1–0.9)
NEUTROS ABS: 2.8 10*3/uL (ref 1.4–7.0)
NEUTROS PCT: 54 %
PLATELETS: 350 10*3/uL (ref 150–379)
RBC: 4.28 x10E6/uL (ref 3.77–5.28)
RDW: 18.5 % — ABNORMAL HIGH (ref 12.3–15.4)
WBC: 5.4 10*3/uL (ref 3.4–10.8)

## 2015-12-01 ENCOUNTER — Telehealth: Payer: Self-pay

## 2015-12-01 NOTE — Telephone Encounter (Signed)
-----   Message from Lucilla Lame, MD sent at 11/29/2015  3:11 PM EDT ----- But the patient know that her blood count Has improved

## 2015-12-01 NOTE — Telephone Encounter (Signed)
Pt notified of lab results. Pt wanted you to know she was still having fowl odor with her bowel movements. She said this is a different odor than normal. Please advise if there is another test we could do to check this. Pt has requested this.

## 2015-12-03 NOTE — Telephone Encounter (Signed)
Is this since the colonoscopy? We had been seeing her for anemia.

## 2015-12-03 NOTE — Telephone Encounter (Signed)
Pt says she mentioned this to you during her last office visit but I didn't see you note it anywhere. She says its been smelling like this before the colonoscopy.

## 2015-12-04 ENCOUNTER — Other Ambulatory Visit: Payer: Self-pay

## 2015-12-04 DIAGNOSIS — R197 Diarrhea, unspecified: Secondary | ICD-10-CM

## 2015-12-04 NOTE — Telephone Encounter (Signed)
Yes we did discuss it but had told her that the smell of the stools were more related to what she ate then intestinal pathology. She can try a probiotic to see if this helps her symptoms.

## 2015-12-04 NOTE — Telephone Encounter (Signed)
Left vm for pt to return my call.  

## 2015-12-04 NOTE — Telephone Encounter (Signed)
Pt has been explained that foods she eat can cause her to have severe stool odor. Advised her to start a daily probiotic and I have order per Dr. Allen Norris a fecal fat test. Pt will go tomorrow and have this done.

## 2015-12-09 ENCOUNTER — Ambulatory Visit (INDEPENDENT_AMBULATORY_CARE_PROVIDER_SITE_OTHER): Payer: Medicare HMO | Admitting: Family Medicine

## 2015-12-09 ENCOUNTER — Encounter: Payer: Self-pay | Admitting: Family Medicine

## 2015-12-09 VITALS — BP 119/74 | HR 76 | Temp 98.2°F | Resp 16 | Ht 65.0 in | Wt 208.0 lb

## 2015-12-09 DIAGNOSIS — D508 Other iron deficiency anemias: Secondary | ICD-10-CM

## 2015-12-09 DIAGNOSIS — K6389 Other specified diseases of intestine: Secondary | ICD-10-CM

## 2015-12-09 DIAGNOSIS — F329 Major depressive disorder, single episode, unspecified: Secondary | ICD-10-CM

## 2015-12-09 DIAGNOSIS — K529 Noninfective gastroenteritis and colitis, unspecified: Secondary | ICD-10-CM

## 2015-12-09 DIAGNOSIS — F32A Depression, unspecified: Secondary | ICD-10-CM

## 2015-12-09 DIAGNOSIS — R197 Diarrhea, unspecified: Secondary | ICD-10-CM | POA: Diagnosis not present

## 2015-12-09 NOTE — Progress Notes (Signed)
Name: Kelly Weber   MRN: [id number]    DOB: Nov 29, 1945   Date:12/09/2015       Progress Note  Subjective  Chief Complaint  Chief Complaint  Patient presents with  . Anemia  . Depression    HPI Here for f/u of depression.  She is anemic but her Hb is coming up.  She has foul smelling stools and is being worked up bu GI.  Shje feels the same on 1/2, 10 mg Lexapro as she did on 20 mg/d.  Doing very well.  We will try to taper off.  No problem-specific Assessment & Plan notes found for this encounter.   Past Medical History:  Diagnosis Date  . Anxiety   . Arthritis    fingers, left knee  . Depression   . GERD (gastroesophageal reflux disease)   . Insomnia 2006  . Skin cancer 2007   Basal Cell CA resected from Left middle finger    Past Surgical History:  Procedure Laterality Date  . COLONOSCOPY WITH PROPOFOL N/A 11/20/2014   Procedure: COLONOSCOPY WITH PROPOFOL;  Surgeon: Lucilla Lame, MD;  Location: [facility];  Service: Endoscopy;  Laterality: N/A;  . ESOPHAGOGASTRODUODENOSCOPY (EGD) WITH PROPOFOL N/A 11/20/2014   Procedure: ESOPHAGOGASTRODUODENOSCOPY (EGD) WITH PROPOFOL;  Surgeon: Lucilla Lame, MD;  Location: [facility];  Service: Endoscopy;  Laterality: N/A;  . POLYPECTOMY  11/20/2014   Procedure: POLYPECTOMY;  Surgeon: Lucilla Lame, MD;  Location: [facility];  Service: Endoscopy;;  . SKIN CANCER EXCISION Left    finger  . TUBAL LIGATION  1970    Family History  Problem Relation Age of Onset  . Brain cancer Mother   . Thyroid disease Mother     Social History   Social History  . Marital status: Married    Spouse name: N/A  . Number of children: N/A  . Years of education: N/A   Occupational History  . Not on file.   Social History Main Topics  . Smoking status: Former Smoker    Packs/day: 1.00    Years: 30.00    Types: Cigarettes    Quit date: 01/19/2014  . Smokeless tobacco: Never Used  . Alcohol use No  . Drug use: No  .  Sexual activity: Not on file   Other Topics Concern  . Not on file   Social History Narrative  . No narrative on file     Current Outpatient Prescriptions:  .  acetaminophen (TYLENOL) 500 MG tablet, Take 1-2 talbets every 8 hours as needed for joint pain., Disp: 100 tablet, Rfl: 0 .  aspirin 81 MG chewable tablet, Chew 81 mg by mouth daily., Disp: , Rfl:  .  Calcium Carb-Cholecalciferol (GNP CALCIUM 600 +D3 PO), Take by mouth. Reported on 08/26/2015, Disp: , Rfl:  .  Ferrous Sulfate (IRON) 325 (65 Fe) MG TABS, Take 1 tablet by mouth daily., Disp: , Rfl:  .  Multiple Vitamin (MULTI-VITAMINS) TABS, Take by mouth., Disp: , Rfl:  .  omeprazole (PRILOSEC) 40 MG capsule, Take 1 capsule (40 mg total) by mouth 2 (two) times daily. (Patient taking differently: Take 40 mg by mouth daily. ), Disp: 60 capsule, Rfl: 12 .  triamcinolone cream (KENALOG) 0.1 %, Apply 1 application topically as needed., Disp: , Rfl:   Allergies  Allergen Reactions  . Penicillins Other (See Comments)     Review of Systems  Constitutional: Negative for chills, fever, malaise/fatigue and weight loss.  HENT: Negative for hearing loss.  Eyes: Negative for blurred vision and double vision.  Respiratory: Negative for cough, shortness of breath and wheezing.   Cardiovascular: Negative for chest pain, orthopnea and leg swelling.  Gastrointestinal: Positive for diarrhea. Negative for abdominal pain, blood in stool, heartburn and nausea.  Genitourinary: Negative for dysuria, frequency and urgency.  Skin: Negative for rash.  Neurological: Negative for dizziness, tremors, weakness and headaches.  Psychiatric/Behavioral: Negative for depression.      Objective  Vitals:   12/09/15 1257  BP: 119/74  Pulse: 76  Resp: 16  Temp: 98.2 F (36.8 C)  TempSrc: Oral  Weight: 208 lb (94.3 kg)  Height: '5\' 5"'  (1.651 m)    Physical Exam  Constitutional: She is well-developed, well-nourished, and in no distress.  HENT:   Head: Normocephalic and atraumatic.  Eyes: Conjunctivae and EOM are normal. Pupils are equal, round, and reactive to light. No scleral icterus.  Neck: Normal range of motion. Neck supple. Carotid bruit is not present. No thyromegaly present.  Cardiovascular: Normal rate, regular rhythm and normal heart sounds.  Exam reveals no gallop and no friction rub.   No murmur heard. Pulmonary/Chest: Effort normal and breath sounds normal. No respiratory distress. She has no wheezes. She has no rales.  Abdominal: Soft. Bowel sounds are normal. She exhibits no distension and no mass. There is no tenderness.  Musculoskeletal: She exhibits no edema.  Some arthritic  Changes of fingers and hands bilaterally; but minimal pain.  Lymphadenopathy:    She has no cervical adenopathy.  Neurological: She is alert.  Psychiatric: Mood, memory, affect and judgment normal.  Vitals reviewed.      Recent Results (from the past 2160 hour(s))  Urine Culture     Status: None   Collection Time: 09/12/15 10:18 AM  Result Value Ref Range   Colony Count 20,OOO COLONIES/ML    Organism ID, Bacteria Multiple bacterial morphotypes present, none    Organism ID, Bacteria predominant. Suggest appropriate recollection if     Organism ID, Bacteria clinically indicated.   Urine Culture     Status: None   Collection Time: 09/16/15  3:17 PM  Result Value Ref Range   Colony Count 40,000 COLONIES/ML    Organism ID, Bacteria Multiple bacterial morphotypes present, none    Organism ID, Bacteria predominant. Suggest appropriate recollection if     Organism ID, Bacteria clinically indicated.   CBC with Differential     Status: Abnormal   Collection Time: 09/29/15  4:56 PM  Result Value Ref Range   WBC 6.5 3.8 - 10.8 K/uL   RBC 4.04 3.80 - 5.10 MIL/uL   Hemoglobin 8.9 (L) 11.7 - 15.5 g/dL   HCT 29.8 (L) 35.0 - 45.0 %   MCV 73.8 (L) 80.0 - 100.0 fL   MCH 22.0 (L) 27.0 - 33.0 pg   MCHC 29.9 (L) 32.0 - 36.0 g/dL   RDW 17.0 (H)  11.0 - 15.0 %   Platelets 335 140 - 400 K/uL   MPV 9.4 7.5 - 12.5 fL   Neutro Abs 3,120 1,500 - 7,800 cells/uL   Lymphs Abs 2,730 850 - 3,900 cells/uL   Monocytes Absolute 455 200 - 950 cells/uL   Eosinophils Absolute 130 15 - 500 cells/uL   Basophils Absolute 65 0 - 200 cells/uL   Neutrophils Relative % 48 %   Lymphocytes Relative 42 %   Monocytes Relative 7 %   Eosinophils Relative 2 %   Basophils Relative 1 %   Smear Review Criteria for review not  met     Comment: ** Please note change in unit of measure and reference range(s). **  CBC with Differential/Platelet     Status: Abnormal   Collection Time: 10/16/15  9:29 AM  Result Value Ref Range   WBC 4.9 3.4 - 10.8 x10E3/uL   RBC 4.04 3.77 - 5.28 x10E6/uL   Hemoglobin 9.4 (L) 11.1 - 15.9 g/dL   Hematocrit 29.5 (L) 34.0 - 46.6 %   MCV 73 (L) 79 - 97 fL   MCH 23.3 (L) 26.6 - 33.0 pg   MCHC 31.9 31.5 - 35.7 g/dL   RDW 18.4 (H) 12.3 - 15.4 %   Platelets 409 (H) 150 - 379 x10E3/uL   Neutrophils 48 %   Lymphs 42 %   Monocytes 8 %   Eos 2 %   Basos 0 %   Neutrophils Absolute 2.4 1.4 - 7.0 x10E3/uL   Lymphocytes Absolute 2.1 0.7 - 3.1 x10E3/uL   Monocytes Absolute 0.4 0.1 - 0.9 x10E3/uL   EOS (ABSOLUTE) 0.1 0.0 - 0.4 x10E3/uL   Basophils Absolute 0.0 0.0 - 0.2 x10E3/uL   Immature Granulocytes 0 %   Immature Grans (Abs) 0.0 0.0 - 0.1 x10E3/uL  CBC with Differential/Platelet     Status: Abnormal   Collection Time: 11/21/15  8:42 AM  Result Value Ref Range   WBC 5.4 3.4 - 10.8 x10E3/uL   RBC 4.28 3.77 - 5.28 x10E6/uL   Hemoglobin 10.3 (L) 11.1 - 15.9 g/dL   Hematocrit 34.1 34.0 - 46.6 %   MCV 80 79 - 97 fL   MCH 24.1 (L) 26.6 - 33.0 pg   MCHC 30.2 (L) 31.5 - 35.7 g/dL   RDW 18.5 (H) 12.3 - 15.4 %   Platelets 350 150 - 379 x10E3/uL   Neutrophils 54 %   Lymphs 38 %   Monocytes 6 %   Eos 2 %   Basos 0 %   Neutrophils Absolute 2.8 1.4 - 7.0 x10E3/uL   Lymphocytes Absolute 2.1 0.7 - 3.1 x10E3/uL   Monocytes Absolute 0.3 0.1  - 0.9 x10E3/uL   EOS (ABSOLUTE) 0.1 0.0 - 0.4 x10E3/uL   Basophils Absolute 0.0 0.0 - 0.2 x10E3/uL   Immature Granulocytes 0 %   Immature Grans (Abs) 0.0 0.0 - 0.1 x10E3/uL     Assessment & Plan  Problem List Items Addressed This Visit      Digestive   Inflammatory bowel disease     Other   Clinical depression - Primary   Anemia   Relevant Medications   Ferrous Sulfate (IRON) 325 (65 Fe) MG TABS    Other Visit Diagnoses   None.     Meds ordered this encounter  Medications  . aspirin 81 MG chewable tablet    Sig: Chew 81 mg by mouth daily.  . Ferrous Sulfate (IRON) 325 (65 Fe) MG TABS    Sig: Take 1 tablet by mouth daily.   1. Clinical depression Taper off Lexapro over next 4 weeks.  2. Other iron deficiency anemias Cont Fe.  3. Inflammatory bowel disease Cont to see Dr. Allen Norris.

## 2015-12-10 LAB — FECAL FAT, QUALITATIVE
Fat Qual Neutral, Stl: NORMAL
Fat Qual Total, Stl: NORMAL

## 2015-12-11 ENCOUNTER — Telehealth: Payer: Self-pay

## 2015-12-11 NOTE — Telephone Encounter (Signed)
-----   Message from Lucilla Lame, MD sent at 12/11/2015 12:08 PM EDT ----- Let the patient know that the stool tests for fecal fat was negative.

## 2015-12-11 NOTE — Telephone Encounter (Signed)
Pt notified of lab results

## 2015-12-16 ENCOUNTER — Telehealth: Payer: Self-pay | Admitting: Gastroenterology

## 2015-12-16 NOTE — Telephone Encounter (Signed)
Patient is returning your call and understands you are out today and will call her tomorrow

## 2015-12-17 ENCOUNTER — Other Ambulatory Visit: Payer: Self-pay

## 2015-12-17 NOTE — Telephone Encounter (Signed)
Advised pt that certain foods can cause her stools to have a bad odor. She was wondering if her medication could be causing the smell. Pt is currently taking iron supplements, Lexapro and multivitamins. I advised her I would ask Dr. Allen Norris if that is possible but she said that was fine. She will just see how things go.

## 2016-02-09 ENCOUNTER — Encounter: Payer: Self-pay | Admitting: Family Medicine

## 2016-02-09 ENCOUNTER — Ambulatory Visit (INDEPENDENT_AMBULATORY_CARE_PROVIDER_SITE_OTHER): Payer: Medicare HMO | Admitting: Family Medicine

## 2016-02-09 VITALS — BP 137/78 | HR 80 | Temp 97.8°F | Resp 16 | Ht 65.0 in | Wt 207.0 lb

## 2016-02-09 DIAGNOSIS — K529 Noninfective gastroenteritis and colitis, unspecified: Secondary | ICD-10-CM | POA: Diagnosis not present

## 2016-02-09 DIAGNOSIS — F32A Depression, unspecified: Secondary | ICD-10-CM

## 2016-02-09 DIAGNOSIS — F329 Major depressive disorder, single episode, unspecified: Secondary | ICD-10-CM

## 2016-02-09 MED ORDER — ESCITALOPRAM OXALATE 10 MG PO TABS
10.0000 mg | ORAL_TABLET | Freq: Every day | ORAL | 6 refills | Status: DC
Start: 2016-02-09 — End: 2016-09-01

## 2016-02-09 NOTE — Progress Notes (Signed)
Name: Kelly Weber   MRN: [id number]    DOB: 10/13/1945   Date:02/09/2016       Progress Note  Subjective  Chief Complaint  Chief Complaint  Patient presents with  . Follow-up  . Depression  . Anemia    HPI Here for f/u of depression.  Not sleeping as well on 5 mg as she did on 10 mg.  Doesn't feel as well on lower dose of Lexapro.  Also c/o sudden explosive diarrhea.  Recommended to take pro-biotic from Dr. Dorothey Baseman office but she stopped it because she continued to have diarrhea.  She has seen Dr. Allen Norris about this and needs to go back to him aain.  No problem-specific Assessment & Plan notes found for this encounter.   Past Medical History:  Diagnosis Date  . Anxiety   . Arthritis    fingers, left knee  . Depression   . GERD (gastroesophageal reflux disease)   . Insomnia 2006  . Skin cancer 2007   Basal Cell CA resected from Left middle finger    Past Surgical History:  Procedure Laterality Date  . COLONOSCOPY WITH PROPOFOL N/A 11/20/2014   Procedure: COLONOSCOPY WITH PROPOFOL;  Surgeon: Lucilla Lame, MD;  Location: [facility];  Service: Endoscopy;  Laterality: N/A;  . ESOPHAGOGASTRODUODENOSCOPY (EGD) WITH PROPOFOL N/A 11/20/2014   Procedure: ESOPHAGOGASTRODUODENOSCOPY (EGD) WITH PROPOFOL;  Surgeon: Lucilla Lame, MD;  Location: [facility];  Service: Endoscopy;  Laterality: N/A;  . POLYPECTOMY  11/20/2014   Procedure: POLYPECTOMY;  Surgeon: Lucilla Lame, MD;  Location: [facility];  Service: Endoscopy;;  . SKIN CANCER EXCISION Left    finger  . TUBAL LIGATION  1970    Family History  Problem Relation Age of Onset  . Brain cancer Mother   . Thyroid disease Mother     Social History   Social History  . Marital status: Married    Spouse name: N/A  . Number of children: N/A  . Years of education: N/A   Occupational History  . Not on file.   Social History Main Topics  . Smoking status: Former Smoker    Packs/day: 1.00    Years: 30.00     Types: Cigarettes    Quit date: 01/19/2014  . Smokeless tobacco: Never Used  . Alcohol use No  . Drug use: No  . Sexual activity: Not on file   Other Topics Concern  . Not on file   Social History Narrative  . No narrative on file     Current Outpatient Prescriptions:  .  acetaminophen (TYLENOL) 500 MG tablet, Take 1-2 talbets every 8 hours as needed for joint pain., Disp: 100 tablet, Rfl: 0 .  aspirin 81 MG chewable tablet, Chew 81 mg by mouth daily., Disp: , Rfl:  .  Calcium Carb-Cholecalciferol (GNP CALCIUM 600 +D3 PO), Take by mouth. Reported on 08/26/2015, Disp: , Rfl:  .  escitalopram (LEXAPRO) 10 MG tablet, Take 1 tablet (10 mg total) by mouth daily., Disp: 30 tablet, Rfl: 6 .  Ferrous Sulfate (IRON) 325 (65 Fe) MG TABS, Take 1 tablet by mouth daily., Disp: , Rfl:  .  loperamide (IMODIUM) 2 MG capsule, Take 2 mg by mouth as needed for diarrhea or loose stools., Disp: , Rfl:  .  Multiple Vitamin (MULTI-VITAMINS) TABS, Take by mouth., Disp: , Rfl:  .  omeprazole (PRILOSEC) 40 MG capsule, Take 1 capsule (40 mg total) by mouth 2 (two) times daily. (Patient taking differently: Take 40 mg  by mouth daily. ), Disp: 60 capsule, Rfl: 12 .  triamcinolone cream (KENALOG) 0.1 %, Apply 1 application topically as needed., Disp: , Rfl:   Allergies  Allergen Reactions  . Penicillins Other (See Comments)     Review of Systems  Constitutional: Negative for chills, fever, malaise/fatigue and weight loss.  HENT: Negative for hearing loss.   Eyes: Negative for blurred vision and double vision.  Respiratory: Negative for cough, shortness of breath and wheezing.   Cardiovascular: Negative for chest pain, palpitations and leg swelling.  Gastrointestinal: Positive for diarrhea. Negative for abdominal pain, blood in stool, constipation, heartburn, melena, nausea and vomiting.  Genitourinary: Negative for dysuria.  Musculoskeletal: Negative for back pain and myalgias.  Skin: Negative for rash.   Neurological: Negative for dizziness, tremors, weakness and headaches.  Psychiatric/Behavioral: Positive for depression. The patient has insomnia.       Objective  Vitals:   02/09/16 1316  BP: 137/78  Pulse: 80  Resp: 16  Temp: 97.8 F (36.6 C)  TempSrc: Oral  Weight: 207 lb (93.9 kg)  Height: 5\' 5"  (1.651 m)    Physical Exam  Constitutional: No distress.  HENT:  Head: Normocephalic and atraumatic.  Eyes: Conjunctivae and EOM are normal. Pupils are equal, round, and reactive to light. No scleral icterus.  Neck: Normal range of motion. Neck supple. Carotid bruit is not present. No thyromegaly present.  Cardiovascular: Normal rate, regular rhythm and normal heart sounds.  Exam reveals no gallop and no friction rub.   No murmur heard. Pulmonary/Chest: Effort normal and breath sounds normal. No respiratory distress. She has no wheezes. She has no rales.  Abdominal: Soft. Bowel sounds are normal. She exhibits no distension, no abdominal bruit and no mass. There is no tenderness.  Musculoskeletal: She exhibits no edema.  Lymphadenopathy:    She has no cervical adenopathy.  Neurological: She is alert.  Psychiatric:  Affect is depressed and she reports having trouble goping to sleep and staying asleep again.  Vitals reviewed.      Recent Results (from the past 2160 hour(s))  CBC with Differential/Platelet     Status: Abnormal   Collection Time: 11/21/15  8:42 AM  Result Value Ref Range   WBC 5.4 3.4 - 10.8 x10E3/uL   RBC 4.28 3.77 - 5.28 x10E6/uL   Hemoglobin 10.3 (L) 11.1 - 15.9 g/dL   Hematocrit 34.1 34.0 - 46.6 %   MCV 80 79 - 97 fL   MCH 24.1 (L) 26.6 - 33.0 pg   MCHC 30.2 (L) 31.5 - 35.7 g/dL   RDW 18.5 (H) 12.3 - 15.4 %   Platelets 350 150 - 379 x10E3/uL   Neutrophils 54 %   Lymphs 38 %   Monocytes 6 %   Eos 2 %   Basos 0 %   Neutrophils Absolute 2.8 1.4 - 7.0 x10E3/uL   Lymphocytes Absolute 2.1 0.7 - 3.1 x10E3/uL   Monocytes Absolute 0.3 0.1 - 0.9  x10E3/uL   EOS (ABSOLUTE) 0.1 0.0 - 0.4 x10E3/uL   Basophils Absolute 0.0 0.0 - 0.2 x10E3/uL   Immature Granulocytes 0 %   Immature Grans (Abs) 0.0 0.0 - 0.1 x10E3/uL  Fecal fat, qualitative     Status: None   Collection Time: 12/09/15  2:32 PM  Result Value Ref Range   Fat Qual Neutral, Stl  Normal     Comment:  Normal (<60 Droplets/HPF)   Fat Qual Total, Stl  Normal     Comment:                               Normal (<100 Droplets/HPF)     Assessment & Plan  Problem List Items Addressed This Visit      Digestive   Chronic diarrhea     Other   Clinical depression - Primary   Relevant Medications   escitalopram (LEXAPRO) 10 MG tablet    Other Visit Diagnoses   None.     Meds ordered this encounter  Medications  . loperamide (IMODIUM) 2 MG capsule    Sig: Take 2 mg by mouth as needed for diarrhea or loose stools.  [name] Kitchen escitalopram (LEXAPRO) 10 MG tablet    Sig: Take 1 tablet (10 mg total) by mouth daily.    Dispense:  30 tablet    Refill:  6   1. Depression, unspecified depression type  - escitalopram (LEXAPRO) 10 MG tablet; Take 1 tablet (10 mg total) by mouth daily.  Dispense: 30 tablet; Refill: 6  2. Chronic diarrhea Recommend that she make another appt with Dr. Allen Norris re: her diarrhea.

## 2016-02-09 NOTE — Patient Instructions (Signed)
Patient declines flu hot.  Has episode of difficulty   breathing 2 yrs ago when she took it that required hospitalization.-jh

## 2016-03-22 ENCOUNTER — Ambulatory Visit (INDEPENDENT_AMBULATORY_CARE_PROVIDER_SITE_OTHER): Payer: Medicare HMO | Admitting: Family Medicine

## 2016-03-22 ENCOUNTER — Encounter: Payer: Self-pay | Admitting: Family Medicine

## 2016-03-22 VITALS — BP 115/72 | HR 71 | Temp 98.0°F | Resp 16 | Ht 65.0 in | Wt 211.0 lb

## 2016-03-22 DIAGNOSIS — F32A Depression, unspecified: Secondary | ICD-10-CM

## 2016-03-22 DIAGNOSIS — F329 Major depressive disorder, single episode, unspecified: Secondary | ICD-10-CM

## 2016-03-22 DIAGNOSIS — R69 Illness, unspecified: Secondary | ICD-10-CM | POA: Diagnosis not present

## 2016-03-22 NOTE — Progress Notes (Signed)
Name: Kelly Weber   MRN: [id number]    DOB: January 17, 1946   Date:03/22/2016       Progress Note  Subjective  Chief Complaint  Chief Complaint  Patient presents with  . Depression    HPI Here for f/u of depression   She feels much better and feels 8-9/10 overall.  She is much more motivated.  Good sleep.  Happier.  No problem-specific Assessment & Plan notes found for this encounter.   Past Medical History:  Diagnosis Date  . Anxiety   . Arthritis    fingers, left knee  . Depression   . GERD (gastroesophageal reflux disease)   . Insomnia 2006  . Skin cancer 2007   Basal Cell CA resected from Left middle finger    Past Surgical History:  Procedure Laterality Date  . COLONOSCOPY WITH PROPOFOL N/A 11/20/2014   Procedure: COLONOSCOPY WITH PROPOFOL;  Surgeon: Lucilla Lame, MD;  Location: [facility];  Service: Endoscopy;  Laterality: N/A;  . ESOPHAGOGASTRODUODENOSCOPY (EGD) WITH PROPOFOL N/A 11/20/2014   Procedure: ESOPHAGOGASTRODUODENOSCOPY (EGD) WITH PROPOFOL;  Surgeon: Lucilla Lame, MD;  Location: [facility];  Service: Endoscopy;  Laterality: N/A;  . POLYPECTOMY  11/20/2014   Procedure: POLYPECTOMY;  Surgeon: Lucilla Lame, MD;  Location: [facility];  Service: Endoscopy;;  . SKIN CANCER EXCISION Left    finger  . TUBAL LIGATION  1970    Family History  Problem Relation Age of Onset  . Brain cancer Mother   . Thyroid disease Mother     Social History   Social History  . Marital status: Married    Spouse name: N/A  . Number of children: N/A  . Years of education: N/A   Occupational History  . Not on file.   Social History Main Topics  . Smoking status: Former Smoker    Packs/day: 1.00    Years: 30.00    Types: Cigarettes    Quit date: 01/19/2014  . Smokeless tobacco: Never Used  . Alcohol use No  . Drug use: No  . Sexual activity: Not on file   Other Topics Concern  . Not on file   Social History Narrative  . No narrative on file      Current Outpatient Prescriptions:  .  acetaminophen (TYLENOL) 500 MG tablet, Take 1-2 talbets every 8 hours as needed for joint pain., Disp: 100 tablet, Rfl: 0 .  Calcium Carb-Cholecalciferol (GNP CALCIUM 600 +D3 PO), Take by mouth. Reported on 08/26/2015, Disp: , Rfl:  .  escitalopram (LEXAPRO) 10 MG tablet, Take 1 tablet (10 mg total) by mouth daily., Disp: 30 tablet, Rfl: 6 .  Ferrous Sulfate (IRON) 325 (65 Fe) MG TABS, Take 1 tablet by mouth daily., Disp: , Rfl:  .  loperamide (IMODIUM) 2 MG capsule, Take 2 mg by mouth as needed for diarrhea or loose stools., Disp: , Rfl:  .  Multiple Vitamin (MULTI-VITAMINS) TABS, Take by mouth., Disp: , Rfl:  .  omeprazole (PRILOSEC) 40 MG capsule, Take 1 capsule (40 mg total) by mouth 2 (two) times daily. (Patient taking differently: Take 40 mg by mouth daily. ), Disp: 60 capsule, Rfl: 12 .  triamcinolone cream (KENALOG) 0.1 %, Apply 1 application topically as needed., Disp: , Rfl:   Allergies  Allergen Reactions  . Penicillins Other (See Comments)     Review of Systems  Constitutional: Negative for chills, fever, malaise/fatigue and weight loss.  HENT: Negative for hearing loss and tinnitus.   Eyes: Negative for  blurred vision and double vision.  Respiratory: Negative for cough, shortness of breath and wheezing.   Cardiovascular: Negative for chest pain, palpitations and leg swelling.  Gastrointestinal: Negative for abdominal pain, constipation and heartburn.  Genitourinary: Negative for dysuria, frequency and urgency.  Skin: Negative for rash.  Neurological: Negative for dizziness, tingling, tremors, weakness and headaches.      Objective  Vitals:   03/22/16 1323  BP: 115/72  Pulse: 71  Resp: 16  Temp: 98 F (36.7 C)  TempSrc: Oral  Weight: 211 lb (95.7 kg)  Height: 5\' 5"  (1.651 m)    Physical Exam  Constitutional: She is oriented to person, place, and time and well-developed, well-nourished, and in no distress. No  distress.  HENT:  Head: Normocephalic and atraumatic.  Eyes: Conjunctivae and EOM are normal. Pupils are equal, round, and reactive to light. No scleral icterus.  Neck: Normal range of motion. Neck supple. No thyromegaly present.  Cardiovascular: Normal rate, regular rhythm and normal heart sounds.  Exam reveals no gallop and no friction rub.   No murmur heard. Pulmonary/Chest: Effort normal and breath sounds normal. No respiratory distress. She has no wheezes. She has no rales.  Abdominal: Soft. Bowel sounds are normal. She exhibits no distension and no mass. There is no tenderness.  Musculoskeletal: She exhibits no edema.  Lymphadenopathy:    She has no cervical adenopathy.  Neurological: She is alert and oriented to person, place, and time.  Psychiatric:  Affect very goods.  Overall feels 8-9/10.   Vitals reviewed.      No results found for this or any previous visit (from the past 2160 hour(s)).   Assessment & Plan  Problem List Items Addressed This Visit      Other   Clinical depression - Primary    1. Depression, unspecified depression type Cont Lexapro Return 4-6 months No orders of the defined types were placed in this encounter.

## 2016-04-09 ENCOUNTER — Ambulatory Visit
Admission: RE | Admit: 2016-04-09 | Discharge: 2016-04-09 | Disposition: A | Discharge status: Home / Self Care | Payer: Medicare HMO | Source: Ambulatory Visit | Attending: Family Medicine | Admitting: Family Medicine

## 2016-04-09 ENCOUNTER — Ambulatory Visit (INDEPENDENT_AMBULATORY_CARE_PROVIDER_SITE_OTHER): Payer: Medicare HMO | Admitting: Family Medicine

## 2016-04-09 ENCOUNTER — Encounter: Payer: Self-pay | Admitting: Family Medicine

## 2016-04-09 VITALS — BP 135/80 | HR 80 | Temp 98.4°F | Ht 67.0 in | Wt 205.0 lb

## 2016-04-09 DIAGNOSIS — J441 Chronic obstructive pulmonary disease with (acute) exacerbation: Secondary | ICD-10-CM | POA: Insufficient documentation

## 2016-04-09 DIAGNOSIS — J432 Centrilobular emphysema: Secondary | ICD-10-CM

## 2016-04-09 DIAGNOSIS — R05 Cough: Secondary | ICD-10-CM | POA: Diagnosis not present

## 2016-04-09 DIAGNOSIS — J302 Other seasonal allergic rhinitis: Secondary | ICD-10-CM | POA: Diagnosis not present

## 2016-04-09 MED ORDER — PREDNISONE 50 MG PO TABS: 50.0000 mg | ORAL_TABLET | Freq: Every day | ORAL | 0 refills | Status: DC

## 2016-04-09 MED ORDER — ALBUTEROL SULFATE HFA 108 (90 BASE) MCG/ACT IN AERS: 2.0000 | INHALATION_SPRAY | Freq: Four times a day (QID) | RESPIRATORY_TRACT | 2 refills | Status: DC | PRN

## 2016-04-09 MED ORDER — FLUTICASONE PROPIONATE 50 MCG/ACT NA SUSP: 2.0000 | Freq: Every day | NASAL | 3 refills | Status: DC

## 2016-04-09 MED ORDER — BENZONATATE 100 MG PO CAPS: 100.0000 mg | ORAL_CAPSULE | Freq: Two times a day (BID) | ORAL | 0 refills | Status: DC | PRN

## 2016-04-09 MED ORDER — AZITHROMYCIN 250 MG PO TABS: ORAL_TABLET | ORAL | 0 refills | Status: DC

## 2016-04-09 NOTE — Assessment & Plan Note (Addendum)
Consistent with mild acute exacerbation of COPD with worsening productive cough. Infrequent exacerbations with mild COPD mostly stable. - No hypoxia but borderline low O2 (93% on RA), afebrile, no recent hospitalization - Does not have albuterol rescue inhaler at home, given no prior complications with COPD - Concern possible pneumonia given subjective fevers/sweats  Plan: 1. Start Prednisone 50mg  x 5 day steroid burst 2. Add antibiotic coverage with Azithromycin z-pak dosing 3. Check CXR today - reviewed results, negative for pneumonia or acute cardio/pulm findings, consistent with COPD, called to notify patient 4. New rx Albuterol inhaler q 4 hr regularly x 2-3 days. Continue use as PRN. Reviewed appropriate inhaler use today and advised ask pharmacist 5. Add rx Tessalon PRN cough, may try Mucinex OTC, Flonase rx 6. Return criteria given if worsening or not improved

## 2016-04-09 NOTE — Assessment & Plan Note (Addendum)
Today with acute COPD exac / bronchitis, otherwise prior stable mild emphysema COPD, former smoker, identified on scans. No prior PFTs or maintenance therapy. - See A&P for COPD exac

## 2016-04-09 NOTE — Progress Notes (Signed)
Subjective:    Patient ID: Kelly Weber, female    DOB: [birth year], 70 y.o.   MRN: [id number]  Kelly Weber is a 70 y.o. female presenting on 04/09/2016 for Cough   HPI   BRONCHITIS / COPD EXAC: Reports symptoms with nasal congestion, eyelid puffiness thought sinuses started 4 days ago, then worsening over past 1-2 days to more productive cough, had some lower ribcage soreness with persistent coughing, also had some sinus pain and pressure. Also worsening with some wheezing and coughing. - Taking DayQuil and NyQuil mild relief but not significant - Prior history of chronic smoking, now quit >2 years ago, has history of mild COPD - No known seasonal / environmental allergies - Admits some sweats overnight, occasional dyspnea especially worse when active - Denies any fevers/chill, nausea, vomiting, diarrhea, abdominal pain, ear pain, muscle aches, chest pain or tightness   Social History  Substance Use Topics  . Smoking status: Former Smoker    Packs/day: 1.00    Years: 30.00    Types: Cigarettes    Quit date: 01/19/2014  . Smokeless tobacco: Never Used  . Alcohol use No    Review of Systems Per HPI unless specifically indicated above     Objective:    BP 135/80 (BP Location: Right Arm, Patient Position: Sitting, Cuff Size: Large)   Pulse 80   Temp 98.4 F (36.9 C) (Oral)   Ht 5\' 7"  (1.702 m)   Wt 205 lb (93 kg)   SpO2 93%   BMI 32.11 kg/m   Wt Readings from Last 3 Encounters:  04/09/16 205 lb (93 kg)  03/22/16 211 lb (95.7 kg)  02/09/16 207 lb (93.9 kg)    Physical Exam  Constitutional: She is oriented to person, place, and time. She appears well-developed and well-nourished. No distress.  Well-appearing, comfortable, cooperative  HENT:  Head: Normocephalic and atraumatic.  Bilateral maxillary sinuses mild tender. Nares mostly patent with some congestion and edema. Bilateral TMs clear without erythema, effusion or bulging. Oropharynx clear with mild posterior  nasal drainage without erythema, exudates, edema or asymmetry.  Eyes: Conjunctivae are normal. Right eye exhibits no discharge. Left eye exhibits no discharge.  Neck: Normal range of motion. Neck supple.  Cardiovascular: Normal rate, regular rhythm, normal heart sounds and intact distal pulses.   No murmur heard. Pulmonary/Chest: No respiratory distress. She has wheezes. She has rales.  Mild increased work of breathing, but speaks full sentences, and appears comfortable. Some reduced air movement diffusely, transmitted upper airway sounds, clear with thick cough. Scattered wheezes exp bilateral.  Musculoskeletal: She exhibits no edema.  Lymphadenopathy:    She has no cervical adenopathy.  Neurological: She is alert and oriented to person, place, and time.  Skin: Skin is warm and dry. She is not diaphoretic.  Psychiatric: Her behavior is normal.  Nursing note and vitals reviewed.      Assessment & Plan:   Problem List Items Addressed This Visit    Seasonal allergies    Likely contributing to recurrent rhinosinusitis - Refilled Flonase      Relevant Medications   fluticasone (FLONASE) 50 MCG/ACT nasal spray   COPD with acute exacerbation ([place]) - Primary    Consistent with mild acute exacerbation of COPD with worsening productive cough. Infrequent exacerbations with mild COPD mostly stable. - No hypoxia but borderline low O2 (93% on RA), afebrile, no recent hospitalization - Does not have albuterol rescue inhaler at home, given no prior complications with COPD - Concern possible  pneumonia given subjective fevers/sweats  Plan: 1. Start Prednisone 50mg  x 5 day steroid burst 2. Add antibiotic coverage with Azithromycin z-pak dosing 3. Check CXR today - reviewed results, negative for pneumonia or acute cardio/pulm findings, consistent with COPD, called to notify patient 4. New rx Albuterol inhaler q 4 hr regularly x 2-3 days. Continue use as PRN. Reviewed appropriate inhaler use today  and advised ask pharmacist 5. Add rx Tessalon PRN cough, may try Mucinex OTC, Flonase rx 6. Return criteria given if worsening or not improved      Relevant Medications   fluticasone (FLONASE) 50 MCG/ACT nasal spray   azithromycin (ZITHROMAX Z-PAK) 250 MG tablet   predniSONE (DELTASONE) 50 MG tablet   benzonatate (TESSALON) 100 MG capsule   albuterol (PROVENTIL HFA;VENTOLIN HFA) 108 (90 Base) MCG/ACT inhaler   Other Relevant Orders   DG Chest 2 View (Completed)   COPD (chronic obstructive pulmonary disease) (HCC)    Today with acute COPD exac / bronchitis, otherwise prior stable mild emphysema COPD, former smoker, identified on scans. No prior PFTs or maintenance therapy. - See A&P for COPD exac      Relevant Medications   fluticasone (FLONASE) 50 MCG/ACT nasal spray   azithromycin (ZITHROMAX Z-PAK) 250 MG tablet   predniSONE (DELTASONE) 50 MG tablet   benzonatate (TESSALON) 100 MG capsule   albuterol (PROVENTIL HFA;VENTOLIN HFA) 108 (90 Base) MCG/ACT inhaler   Other Relevant Orders   DG Chest 2 View (Completed)      Meds ordered this encounter  Medications  . fluticasone (FLONASE) 50 MCG/ACT nasal spray    Sig: Place 2 sprays into both nostrils daily. Use for 4-6 weeks then stop and use seasonally or as needed.    Dispense:  16 g    Refill:  3  . azithromycin (ZITHROMAX Z-PAK) 250 MG tablet    Sig: Take 2 tabs (500mg  total) on Day 1. Take 1 tab (250mg ) daily for next 4 days.    Dispense:  6 tablet    Refill:  0  . predniSONE (DELTASONE) 50 MG tablet    Sig: Take 1 tablet (50 mg total) by mouth daily with breakfast.    Dispense:  5 tablet    Refill:  0  . benzonatate (TESSALON) 100 MG capsule    Sig: Take 1 capsule (100 mg total) by mouth 2 (two) times daily as needed for cough.    Dispense:  20 capsule    Refill:  0  . albuterol (PROVENTIL HFA;VENTOLIN HFA) 108 (90 Base) MCG/ACT inhaler    Sig: Inhale 2 puffs into the lungs every 6 (six) hours as needed for wheezing or  shortness of breath (cough).    Dispense:  1 Inhaler    Refill:  2      Follow up plan: Return in about 2 weeks (around 04/23/2016) for COPD.  Nobie Putnam, [facility] 04/09/2016, 6:09 PM

## 2016-04-09 NOTE — Patient Instructions (Signed)
Thank you for coming in to clinic today.  1. It sounds like you had an Upper Respiratory Virus that has settled into a Bronchitis, lower respiratory tract infection. I don't have concerns for pneumonia today, and think that this should gradually improve. Once you are feeling better, the cough may take a few weeks to fully resolve. I do hear wheezing and coarse breath sounds, this may be due to the virus, also could be related history of smoking. - Start Prednisone 50mg  daily for next 5 days - this will open up lungs allow you to breath better and treat that wheezing or bronchospasm start Azithromycin Z pak (antibiotic) 2 tabs day 1, then 1 tab x 4 days, complete entire course even if improved - Use Albuterol inhaler 2 puffs every 4-6 hours around the clock for next 2-3 days, max up to 5 days then use as needed (ask pharmacist to demonstrate proper inhaler use) - Start Loratadine (Claritin) 10mg  daily and Flonase 2 sprays in each nostril daily for next 4-6 weeks, then you may stop and use seasonally or as needed - Start OTC Mucinex for 1 week or less, to help clear the mucus - Use nasal saline (Simply Saline or Ocean Spray) to flush nasal congestion multiple times a day, may help cough - Drink plenty of fluids to improve congestion  If your symptoms seem to worsen instead of improve over next several days, including significant fever / chills, worsening shortness of breath, worsening wheezing, or nausea / vomiting and can't take medicines - return sooner or go to hospital Emergency Department for more immediate treatment.  Please schedule a follow-up appointment with Dr. Parks Ranger in 2 weeks as needed for bronchitis COPD if worsening  If you have any other questions or concerns, please feel free to call the clinic or send a message through [place]. You may also schedule an earlier appointment if necessary.  Nobie Putnam, DO [place]

## 2016-04-09 NOTE — Assessment & Plan Note (Signed)
Likely contributing to recurrent rhinosinusitis - Refilled Flonase

## 2016-04-20 ENCOUNTER — Ambulatory Visit (INDEPENDENT_AMBULATORY_CARE_PROVIDER_SITE_OTHER): Payer: Medicare HMO | Admitting: Family Medicine

## 2016-04-20 ENCOUNTER — Encounter: Payer: Self-pay | Admitting: Family Medicine

## 2016-04-20 VITALS — BP 133/84 | HR 73 | Temp 98.0°F | Ht 67.0 in | Wt 210.0 lb

## 2016-04-20 DIAGNOSIS — B349 Viral infection, unspecified: Secondary | ICD-10-CM

## 2016-04-20 DIAGNOSIS — M62830 Muscle spasm of back: Secondary | ICD-10-CM

## 2016-04-20 DIAGNOSIS — N390 Urinary tract infection, site not specified: Secondary | ICD-10-CM

## 2016-04-20 DIAGNOSIS — R42 Dizziness and giddiness: Secondary | ICD-10-CM

## 2016-04-20 LAB — POCT URINALYSIS DIPSTICK
BILIRUBIN UA: NEGATIVE
Glucose, UA: NEGATIVE
KETONES UA: NEGATIVE
NITRITE UA: NEGATIVE
PH UA: 7
SPEC GRAV UA: 1.01
Urobilinogen, UA: 0.2

## 2016-04-20 MED ORDER — MECLIZINE HCL 25 MG PO TABS: 25.0000 mg | ORAL_TABLET | Freq: Three times a day (TID) | ORAL | 1 refills | Status: DC | PRN

## 2016-04-20 MED ORDER — CYCLOBENZAPRINE HCL 5 MG PO TABS: 5.0000 mg | ORAL_TABLET | Freq: Three times a day (TID) | ORAL | 1 refills | Status: DC | PRN

## 2016-04-20 NOTE — Progress Notes (Signed)
Name: Kelly Weber   MRN: [id number]    DOB: 1945/10/16   Date:04/20/2016       Progress Note  Subjective  Chief Complaint  Chief Complaint  Patient presents with  . Flank Pain  . Dizziness    HPI C/o dizziness and some flank pain for past 2-3 days.  Dizzy with standing, walking.  No urinary sx.  No nausea.  Appetite ok.    No problem-specific Assessment & Plan notes found for this encounter.   Past Medical History:  Diagnosis Date  . Anxiety   . Arthritis    fingers, left knee  . Depression   . GERD (gastroesophageal reflux disease)   . Insomnia 2006  . Skin cancer 2007   Basal Cell CA resected from Left middle finger    Social History  Substance Use Topics  . Smoking status: Former Smoker    Packs/day: 1.00    Years: 30.00    Types: Cigarettes    Quit date: 01/19/2014  . Smokeless tobacco: Never Used  . Alcohol use No     Current Outpatient Prescriptions:  .  acetaminophen (TYLENOL) 500 MG tablet, Take 1-2 talbets every 8 hours as needed for joint pain., Disp: 100 tablet, Rfl: 0 .  albuterol (PROVENTIL HFA;VENTOLIN HFA) 108 (90 Base) MCG/ACT inhaler, Inhale 2 puffs into the lungs every 6 (six) hours as needed for wheezing or shortness of breath (cough)., Disp: 1 Inhaler, Rfl: 2 .  Calcium Carb-Cholecalciferol (GNP CALCIUM 600 +D3 PO), Take by mouth. Reported on 08/26/2015, Disp: , Rfl:  .  escitalopram (LEXAPRO) 10 MG tablet, Take 1 tablet (10 mg total) by mouth daily., Disp: 30 tablet, Rfl: 6 .  Ferrous Sulfate (IRON) 325 (65 Fe) MG TABS, Take 1 tablet by mouth daily., Disp: , Rfl:  .  fluticasone (FLONASE) 50 MCG/ACT nasal spray, Place 2 sprays into both nostrils daily. Use for 4-6 weeks then stop and use seasonally or as needed., Disp: 16 g, Rfl: 3 .  loperamide (IMODIUM) 2 MG capsule, Take 2 mg by mouth as needed for diarrhea or loose stools., Disp: , Rfl:  .  Multiple Vitamin (MULTI-VITAMINS) TABS, Take by mouth., Disp: , Rfl:  .  omeprazole (PRILOSEC) 40 MG  capsule, Take 1 capsule (40 mg total) by mouth 2 (two) times daily. (Patient taking differently: Take 40 mg by mouth daily. ), Disp: 60 capsule, Rfl: 12 .  triamcinolone cream (KENALOG) 0.1 %, Apply 1 application topically as needed., Disp: , Rfl:  .  benzonatate (TESSALON) 100 MG capsule, Take 1 capsule (100 mg total) by mouth 2 (two) times daily as needed for cough. (Patient not taking: Reported on 04/20/2016), Disp: 20 capsule, Rfl: 0  Allergies  Allergen Reactions  . Penicillins Other (See Comments)    Review of Systems  Constitutional: Positive for malaise/fatigue. Negative for chills, fever and weight loss.  HENT: Negative for hearing loss and tinnitus.   Eyes: Negative for blurred vision and double vision.  Respiratory: Negative for cough, shortness of breath and wheezing.   Cardiovascular: Negative for chest pain, palpitations and leg swelling.  Gastrointestinal: Positive for diarrhea. Negative for abdominal pain, blood in stool, heartburn, nausea and vomiting.  Genitourinary: Positive for flank pain. Negative for dysuria, frequency and urgency.  Skin: Negative for rash.  Neurological: Positive for dizziness and weakness. Negative for tingling, tremors and headaches.      Objective  Vitals:   04/20/16 1026  BP: 133/84  Pulse: 73  Temp: 98 F (36.7  C)  TempSrc: Oral  Weight: 210 lb (95.3 kg)  Height: 5\' 7"  (1.702 m)     Physical Exam  Constitutional: She is oriented to person, place, and time. She appears distressed (appears to feel ill).  HENT:  Head: Normocephalic and atraumatic.  Right Ear: External ear normal.  Left Ear: External ear normal.  Mouth/Throat: Oropharynx is clear and moist.  Neck: Normal range of motion. Neck supple. Carotid bruit is not present. No thyromegaly present.  Cardiovascular: Normal rate, regular rhythm and normal heart sounds.  Exam reveals no gallop.   No murmur heard. Pulmonary/Chest: Effort normal and breath sounds normal. No  respiratory distress. She has no wheezes. She has no rales.  No CVA tenderness.  Some mild tenderness to palp over L post lat ribs.  Abdominal: Soft. Bowel sounds are normal. She exhibits no distension and no mass. There is no tenderness.  Musculoskeletal: She exhibits no edema.  Lymphadenopathy:    She has no cervical adenopathy.  Neurological: She is alert and oriented to person, place, and time.  Dizziness with head motion  Vitals reviewed.     Recent Results (from the past 2160 hour(s))  POCT urinalysis dipstick     Status: Abnormal   Collection Time: 04/20/16 10:35 AM  Result Value Ref Range   Color, UA yellow    Clarity, UA clear    Glucose, UA neg    Bilirubin, UA neg    Ketones, UA neg    Spec Grav, UA 1.010    Blood, UA trace    pH, UA 7.0    Protein, UA trace    Urobilinogen, UA 0.2    Nitrite, UA negative    Leukocytes, UA Trace (A) Negative     Assessment & Plan  1. Urinary tract infection without hematuria, site unspecified  - Urine Culture - POCT urinalysis dipstick  2. Vertigo  - meclizine (ANTIVERT) 25 MG tablet; Take 1 tablet (25 mg total) by mouth 3 (three) times daily as needed for dizziness.  Dispense: 30 tablet; Refill: 1  3. Viral syndrome   4. Muscle spasm of back  - cyclobenzaprine (FLEXERIL) 5 MG tablet; Take 1 tablet (5 mg total) by mouth 3 (three) times daily as needed for muscle spasms.  Dispense: 30 tablet; Refill: 1

## 2016-04-21 LAB — URINE CULTURE

## 2016-04-23 DIAGNOSIS — R69 Illness, unspecified: Secondary | ICD-10-CM | POA: Diagnosis not present

## 2016-06-28 ENCOUNTER — Ambulatory Visit: Payer: Medicare HMO | Admitting: Cardiovascular Disease

## 2016-07-07 DIAGNOSIS — I10 Essential (primary) hypertension: Secondary | ICD-10-CM | POA: Diagnosis not present

## 2016-07-07 DIAGNOSIS — H2510 Age-related nuclear cataract, unspecified eye: Secondary | ICD-10-CM | POA: Diagnosis not present

## 2016-07-07 DIAGNOSIS — H524 Presbyopia: Secondary | ICD-10-CM | POA: Diagnosis not present

## 2016-07-07 DIAGNOSIS — Z0100 Encounter for examination of eyes and vision without abnormal findings: Secondary | ICD-10-CM | POA: Diagnosis not present

## 2016-07-26 ENCOUNTER — Telehealth: Payer: Self-pay | Admitting: Cardiovascular Disease

## 2016-07-26 NOTE — Telephone Encounter (Signed)
FYI: Patient does not feel she needs any more follow up. Stated that she thinks her weight was the problem

## 2016-08-10 ENCOUNTER — Other Ambulatory Visit: Payer: Self-pay | Admitting: Family Medicine

## 2016-08-10 DIAGNOSIS — Z1231 Encounter for screening mammogram for malignant neoplasm of breast: Secondary | ICD-10-CM

## 2016-08-24 ENCOUNTER — Ambulatory Visit: Payer: Medicare HMO | Admitting: Family Medicine

## 2016-08-25 ENCOUNTER — Ambulatory Visit: Payer: Medicare HMO | Admitting: Family Medicine

## 2016-09-01 ENCOUNTER — Ambulatory Visit (INDEPENDENT_AMBULATORY_CARE_PROVIDER_SITE_OTHER): Payer: Medicare HMO | Admitting: Family Medicine

## 2016-09-01 ENCOUNTER — Encounter: Payer: Self-pay | Admitting: Family Medicine

## 2016-09-01 VITALS — BP 135/68 | HR 71 | Temp 98.3°F | Ht 63.5 in | Wt 210.4 lb

## 2016-09-01 DIAGNOSIS — K219 Gastro-esophageal reflux disease without esophagitis: Secondary | ICD-10-CM | POA: Diagnosis not present

## 2016-09-01 DIAGNOSIS — F339 Major depressive disorder, recurrent, unspecified: Secondary | ICD-10-CM

## 2016-09-01 DIAGNOSIS — J432 Centrilobular emphysema: Secondary | ICD-10-CM | POA: Diagnosis not present

## 2016-09-01 DIAGNOSIS — K449 Diaphragmatic hernia without obstruction or gangrene: Secondary | ICD-10-CM | POA: Diagnosis not present

## 2016-09-01 DIAGNOSIS — G4701 Insomnia due to medical condition: Secondary | ICD-10-CM | POA: Diagnosis not present

## 2016-09-01 DIAGNOSIS — R69 Illness, unspecified: Secondary | ICD-10-CM | POA: Diagnosis not present

## 2016-09-01 MED ORDER — OMEPRAZOLE 40 MG PO CPDR: DELAYED_RELEASE_CAPSULE | ORAL | 5 refills | Status: DC

## 2016-09-01 MED ORDER — SERTRALINE HCL 25 MG PO TABS: 25.0000 mg | ORAL_TABLET | Freq: Every day | ORAL | 2 refills | Status: DC

## 2016-09-01 NOTE — Patient Instructions (Signed)
Thank you for coming to the clinic today.  1. As discussed, it sounds like your symptoms are primarily related to depression / adjustment disorder. This is a very common problem and be related to several factors, including life stressors.  Take HALF pill of Escitalopram (LExapro) for next 3 days, then STOP and that day start treatment with Sertraline (Zoloft), take one pill 25mg  daily with food. As discussed most medications are also used for mood disorders such as depression, because they work on similar chemicals in your brain. It may take up to 3-6 weeks for the medicine to take full effect and for you to notice a difference, sometimes you may notice it working sooner, otherwise we may need to adjust the dose.  Do recommend future may need a therapist, if not improving.  In 2-3 weeks if really no improvement, can contact office and we can recommend increase dose from 25 to 50mg  (take TWO pills at one time if needed)  ALso can consider adding Trazodone low dose to help sleep and mood in near future.  ------- For GERD or acid reflux  Sent new rx Omeprazole 40mg  daily as needed - decrease from every other day, to every 3 days if needed, eventually can try coming off of it completely. And if needed next step is switch to LOWER dose at 20mg  (Prilosec OTC or Omeprazole - over the counter) again daily vs every other day  You will be due for FASTING BLOOD WORK (no food or drink after midnight before, only water or coffee without cream/sugar on the morning of)  - Please go ahead and schedule a "Lab Only" visit in the morning at the clinic for lab draw in 3 months  - Make sure Lab Only appointment is at least 1-2 weeks before your next appointment, so that results will be available  For Lab Results, once available within 2-3 days of blood draw, you can can log in to MyChart online to view your results and a brief explanation. Also, we can discuss results at next follow-up visit.   Please schedule  a follow-up appointment with Dr. Parks Ranger in 3 months for Annual Physical  If you have any other questions or concerns, please feel free to call the clinic or send a message through [place]. You may also schedule an earlier appointment if necessary.  Nobie Putnam, DO [place]

## 2016-09-01 NOTE — Progress Notes (Signed)
Subjective:    Patient ID: Kelly Weber, female    DOB: 1946-01-28, 71 y.o.   MRN: [id number]  Kelly Weber is a 71 y.o. female presenting on 09/01/2016 for COPD (depression)   HPI   Depression, Major, Chronic recurrent: - Reports problem with onset clinical depression in past 2 years, trigger with stress and acute life changes with her grandson, having problems with substance abuse. - Now recent problem about 3 weeks ago with acute stressor her grandson was found to be using substances again and involved trouble with law. She states that she is very close to him and emotionally invest since she helped raise him. She has had some worsening  - She has been taking Escitalopram 20mg  for >1 year, and then had lowered dose to 10mg  for several months, then now taking half pill for dose 5mg  daily for past 4 months, trying to wean off, was eager to quit taking this med - In past she took Zoloft for short period and did well - [place] grand daughter is 24 years old - Admits decreased energy, motivation - History of insomnia, and in past was on Ambien 10mg  2016 or 2017, she had been only taking half pill - Has faith based support system and [place] friends to talk to, never seen psychiatry or therapist - Denies any suicidal or homicidal ideation  GERD - Followed by GI Dr Allen Norris, about 1.5 years ago had EGD 11/20/2014 reported to be normal, reviewed EGD with only small hiatal hernia, no esophagitis or ulceration. She had extensive evaluation with colonoscopy as well for possible source of iron deficiency anemia and chronic blood loss. - Also endorses history of some dysphagia, in past that seems to be mild and intermittent - She was taking Omeprazole 40mg  BID for about 1 year at that time, now over past 6-12 months, she has reduced dose of Omeprazole 40mg  to every other day  COPD, without exacerbation - Last visit with me for AECOPD, 04/09/16, treated with azithromycin and prednisone, has done much  better since - Today reports doing well without any recent flares. States last use of Albuterol 3 weeks ago, does try to do deep breathing exercises if worsening shortness of breath, sometimes related to mood or anxiety. - Not on any maintenance therapy at this time - Former smoker, quit >2 years ago  Health Maintenance: - Scheduled to go get Mammogram in few weeks   Depression screen [facility] 2/9 09/01/2016 03/22/2016 12/09/2015  Decreased Interest 3 1 0  Down, Depressed, Hopeless 2 0 0  PHQ - 2 Score 5 1 0  Altered sleeping 3 - -  Tired, decreased energy 2 - -  Change in appetite 0 - -  Feeling bad or failure about yourself  1 - -  Trouble concentrating 0 - -  Moving slowly or fidgety/restless 0 - -  Suicidal thoughts 0 - -  PHQ-9 Score 11 - -  Difficult doing work/chores - - -   No flowsheet data found.   Social History  Substance Use Topics  . Smoking status: Former Smoker    Packs/day: 1.00    Years: 30.00    Types: Cigarettes    Quit date: 01/19/2014  . Smokeless tobacco: Never Used  . Alcohol use No    Review of Systems Per HPI unless specifically indicated above     Objective:    BP 135/68   Pulse 71   Temp 98.3 F (36.8 C) (Oral)   Ht 5' 3.5" (  1.613 m)   Wt 210 lb 6.4 oz (95.4 kg)   SpO2 98%   BMI 36.69 kg/m   Wt Readings from Last 3 Encounters:  09/01/16 210 lb 6.4 oz (95.4 kg)  04/20/16 210 lb (95.3 kg)  04/09/16 205 lb (93 kg)    Physical Exam  Constitutional: She is oriented to person, place, and time. She appears well-developed and well-nourished. No distress.  Well-appearing, comfortable, cooperative, obese  HENT:  Head: Normocephalic and atraumatic.  Mouth/Throat: Oropharynx is clear and moist.  Eyes: Conjunctivae are normal. Right eye exhibits no discharge. Left eye exhibits no discharge.  Neck: Normal range of motion. Neck supple. No thyromegaly present.  Cardiovascular: Normal rate, regular rhythm, normal heart sounds and intact distal  pulses.   No murmur heard. Pulmonary/Chest: Effort normal and breath sounds normal. No respiratory distress. She has no wheezes. She has no rales.  Good air movement.  Abdominal: Soft. Bowel sounds are normal. She exhibits no distension. There is no tenderness.  Musculoskeletal: Normal range of motion. She exhibits no edema.  Lymphadenopathy:    She has no cervical adenopathy.  Neurological: She is alert and oriented to person, place, and time.  Skin: Skin is warm and dry. No rash noted. She is not diaphoretic. No erythema.  Psychiatric: She has a normal mood and affect. Her behavior is normal.  Well groomed, good eye contact, normal speech and thoughts. Good insight into condition. Appriopriately sad during certain parts of interview some tearing, congruent mood, does improve and remains positive.  Nursing note and vitals reviewed.      Assessment & Plan:   Problem List Items Addressed This Visit    Major depression, recurrent, chronic ([place]) - Primary    Suspected MDD now with gradual worsening causing more difficulty functioning, previously coped well, now affecting physically with fatigue from poor sleep / also mixed anxiety symptoms worrying with family stress (grandson as primary issue) Suspected insomnia is secondary to anxiety/mood. -PHQ9: 11, difficult - Prior med Zoloft (only short period unsure why stopped) - No prior Psych / counseling, has church support and friends  Plan: 1. Discussion on chronic depression management, complications, likely contributing to insomnia 2. Agree to try switch back to Zoloft to see if better depression control, advised taper off Escitalopram back to 10mg  (half of 20mg ) for 3 days then switch to Sertraline 25mg  daily AM with food, counseling on potential side effects risks, reviewed possible GI intolerance, insomnia (although likely to improve this given anxiety likely source of insomnia), sexual dysfunction, reviewed black box warning inc suicidal  (no prior history, unlikely concern) - anticipate 4-6 weeks for notable effect, may need titrate dose to 50 or x2 tabs in future 3. Advised recommend therapy / counseling in future - will give handout locations/names if need - declined today 4. Future consider adding Trazodone for insomnia, in past had been on Ambien with good results but this was rx 10mg  and she took half tab, caution with ambien in future but could use this temporarily if needed 5. Follow-up 4-6 weeks depression if needed otherwise patient opts to follow-up 3 months for physical, med adjust, PHQ9      Relevant Medications   sertraline (ZOLOFT) 25 MG tablet   Insomnia due to medical condition   Relevant Medications   sertraline (ZOLOFT) 25 MG tablet   Hiatal hernia    Known small hiatal hernia 2 years ago on EGD, likely contributing to GERD history - Control GERD with PPI still, but now  work on tapering dose as planned, see A&P - Follow-up with GI Dr Allen Norris if needed      Relevant Medications   omeprazole (PRILOSEC) 40 MG capsule   GERD (gastroesophageal reflux disease)    Stable chronic GERD associated with small hiatal hernia. Last EGD 11/2014 without esophagitis or ulceration / gastritis, did have reported stricture and some difficulty with motility vs dysphagia perhaps but since improved - Had been on PPI 40mg  BID for 1 year now reducing dose - Currently stable on Omeprazole 40mg  every other day  Plan: 1. Renew rx will refill at Omeprazole 40mg  daily PRN taper to every other day, counseling on eventually should get to 20mg  daily or continue tapering down and off, reviewed risks and benefits. Discontinued H2 blocker from med list off this, not on dual therapy 2. Follow-up 3 months progress on PPI      Relevant Medications   omeprazole (PRILOSEC) 40 MG capsule   COPD (chronic obstructive pulmonary disease) (HCC)    Stable chronic COPD mild emphysema without exacerbation today - Former smoker. No prior PFTs or  maintenance therapy. - Last exac 03/2016, none in >5 months - Reviewed course if any worsening respiratory status or more frequent exac may need formal PFTs further eval and consider maintenance therapy         Meds ordered this encounter  Medications  . sertraline (ZOLOFT) 25 MG tablet    Sig: Take 1 tablet (25 mg total) by mouth daily. With food. If not significantly improved by 2-4 weeks can double dose to 50mg  daily.    Dispense:  30 tablet    Refill:  2  . omeprazole (PRILOSEC) 40 MG capsule    Sig: Start daily PRN, can gradually taper to 1 capsule every OTHER day.    Dispense:  30 capsule    Refill:  5   Completed form from [facility] for screening purposes today and update diagnosis. To be faxed back to ins.  Follow up plan: Return in about 3 months (around 12/02/2016) for 3 months for Annual Physical.  Nobie Putnam, DO [facility] 09/02/2016, 7:21 AM

## 2016-09-02 ENCOUNTER — Other Ambulatory Visit: Payer: Self-pay | Admitting: Family Medicine

## 2016-09-02 DIAGNOSIS — D508 Other iron deficiency anemias: Secondary | ICD-10-CM

## 2016-09-02 DIAGNOSIS — F339 Major depressive disorder, recurrent, unspecified: Secondary | ICD-10-CM

## 2016-09-02 DIAGNOSIS — E7800 Pure hypercholesterolemia, unspecified: Secondary | ICD-10-CM

## 2016-09-02 DIAGNOSIS — Z0000 Encounter for general adult medical examination without abnormal findings: Secondary | ICD-10-CM

## 2016-09-02 DIAGNOSIS — E785 Hyperlipidemia, unspecified: Secondary | ICD-10-CM | POA: Insufficient documentation

## 2016-09-02 DIAGNOSIS — R7309 Other abnormal glucose: Secondary | ICD-10-CM

## 2016-09-02 NOTE — Assessment & Plan Note (Signed)
Suspected MDD now with gradual worsening causing more difficulty functioning, previously coped well, now affecting physically with fatigue from poor sleep / also mixed anxiety symptoms worrying with family stress (grandson as primary issue) Suspected insomnia is secondary to anxiety/mood. -PHQ9: 11, difficult - Prior med Zoloft (only short period unsure why stopped) - No prior Psych / counseling, has church support and friends  Plan: 1. Discussion on chronic depression management, complications, likely contributing to insomnia 2. Agree to try switch back to Zoloft to see if better depression control, advised taper off Escitalopram back to 10mg  (half of 20mg ) for 3 days then switch to Sertraline 25mg  daily AM with food, counseling on potential side effects risks, reviewed possible GI intolerance, insomnia (although likely to improve this given anxiety likely source of insomnia), sexual dysfunction, reviewed black box warning inc suicidal (no prior history, unlikely concern) - anticipate 4-6 weeks for notable effect, may need titrate dose to 50 or x2 tabs in future 3. Advised recommend therapy / counseling in future - will give handout locations/names if need - declined today 4. Future consider adding Trazodone for insomnia, in past had been on Ambien with good results but this was rx 10mg  and she took half tab, caution with ambien in future but could use this temporarily if needed 5. Follow-up 4-6 weeks depression if needed otherwise patient opts to follow-up 3 months for physical, med adjust, PHQ9

## 2016-09-02 NOTE — Assessment & Plan Note (Signed)
Stable chronic COPD mild emphysema without exacerbation today - Former smoker. No prior PFTs or maintenance therapy. - Last exac 03/2016, none in >5 months - Reviewed course if any worsening respiratory status or more frequent exac may need formal PFTs further eval and consider maintenance therapy

## 2016-09-02 NOTE — Assessment & Plan Note (Signed)
Stable chronic GERD associated with small hiatal hernia. Last EGD 11/2014 without esophagitis or ulceration / gastritis, did have reported stricture and some difficulty with motility vs dysphagia perhaps but since improved - Had been on PPI 40mg  BID for 1 year now reducing dose - Currently stable on Omeprazole 40mg  every other day  Plan: 1. Renew rx will refill at Omeprazole 40mg  daily PRN taper to every other day, counseling on eventually should get to 20mg  daily or continue tapering down and off, reviewed risks and benefits. Discontinued H2 blocker from med list off this, not on dual therapy 2. Follow-up 3 months progress on PPI

## 2016-09-02 NOTE — Assessment & Plan Note (Signed)
Known small hiatal hernia 2 years ago on EGD, likely contributing to GERD history - Control GERD with PPI still, but now work on tapering dose as planned, see A&P - Follow-up with GI Dr Allen Norris if needed

## 2016-09-07 ENCOUNTER — Ambulatory Visit
Admission: RE | Admit: 2016-09-07 | Discharge: 2016-09-07 | Disposition: A | Discharge status: Home / Self Care | Payer: Medicare HMO | Source: Ambulatory Visit | Attending: Family Medicine | Admitting: Family Medicine

## 2016-09-07 DIAGNOSIS — Z1231 Encounter for screening mammogram for malignant neoplasm of breast: Secondary | ICD-10-CM

## 2016-09-23 ENCOUNTER — Other Ambulatory Visit: Payer: Self-pay | Admitting: Nurse Practitioner

## 2016-09-23 DIAGNOSIS — G4701 Insomnia due to medical condition: Secondary | ICD-10-CM

## 2016-09-23 DIAGNOSIS — F339 Major depressive disorder, recurrent, unspecified: Secondary | ICD-10-CM

## 2016-09-23 MED ORDER — SERTRALINE HCL 25 MG PO TABS: 50.0000 mg | ORAL_TABLET | Freq: Every day | ORAL | 0 refills | Status: DC

## 2016-10-20 DIAGNOSIS — L298 Other pruritus: Secondary | ICD-10-CM | POA: Diagnosis not present

## 2016-10-20 DIAGNOSIS — D2261 Melanocytic nevi of right upper limb, including shoulder: Secondary | ICD-10-CM | POA: Diagnosis not present

## 2016-10-20 DIAGNOSIS — Z85828 Personal history of other malignant neoplasm of skin: Secondary | ICD-10-CM | POA: Diagnosis not present

## 2016-10-20 DIAGNOSIS — L821 Other seborrheic keratosis: Secondary | ICD-10-CM | POA: Diagnosis not present

## 2016-10-20 DIAGNOSIS — D225 Melanocytic nevi of trunk: Secondary | ICD-10-CM | POA: Diagnosis not present

## 2016-10-20 DIAGNOSIS — L309 Dermatitis, unspecified: Secondary | ICD-10-CM | POA: Diagnosis not present

## 2016-10-26 DIAGNOSIS — R69 Illness, unspecified: Secondary | ICD-10-CM | POA: Diagnosis not present

## 2016-11-24 DIAGNOSIS — R69 Illness, unspecified: Secondary | ICD-10-CM | POA: Diagnosis not present

## 2016-11-26 ENCOUNTER — Ambulatory Visit
Admission: EM | Admit: 2016-11-26 | Discharge: 2016-11-26 | Disposition: A | Discharge status: Home / Self Care | Payer: Medicare HMO | Attending: Family Medicine | Admitting: Family Medicine

## 2016-11-26 ENCOUNTER — Ambulatory Visit (INDEPENDENT_AMBULATORY_CARE_PROVIDER_SITE_OTHER): Payer: Medicare HMO

## 2016-11-26 DIAGNOSIS — Y9355 Activity, bike riding: Secondary | ICD-10-CM

## 2016-11-26 DIAGNOSIS — S52509A Unspecified fracture of the lower end of unspecified radius, initial encounter for closed fracture: Secondary | ICD-10-CM | POA: Diagnosis not present

## 2016-11-26 DIAGNOSIS — S52501A Unspecified fracture of the lower end of right radius, initial encounter for closed fracture: Secondary | ICD-10-CM

## 2016-11-26 MED ORDER — KETOROLAC TROMETHAMINE 60 MG/2ML IM SOLN
60.0000 mg | Freq: Once | INTRAMUSCULAR | Status: AC
  Administered 2016-11-26: 60 mg via INTRAMUSCULAR

## 2016-11-26 MED ORDER — HYDROCODONE-ACETAMINOPHEN 5-325 MG PO TABS: 1.0000 | ORAL_TABLET | Freq: Three times a day (TID) | ORAL | 0 refills | Status: DC | PRN
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
## 2016-11-26 NOTE — Discharge Instructions (Signed)
Follow-up with orthopedic choice

## 2016-11-26 NOTE — ED Notes (Signed)
Patient shows no signs of adverse reaction to medication at this time.  

## 2016-11-29 DIAGNOSIS — S52501A Unspecified fracture of the lower end of right radius, initial encounter for closed fracture: Secondary | ICD-10-CM | POA: Diagnosis not present

## 2016-12-01 ENCOUNTER — Other Ambulatory Visit: Payer: Medicare HMO

## 2016-12-07 ENCOUNTER — Encounter: Payer: Medicare HMO | Admitting: Family Medicine

## 2016-12-07 DIAGNOSIS — S52501A Unspecified fracture of the lower end of right radius, initial encounter for closed fracture: Secondary | ICD-10-CM | POA: Diagnosis not present

## 2016-12-17 ENCOUNTER — Other Ambulatory Visit: Payer: Medicare HMO

## 2016-12-17 DIAGNOSIS — Z0000 Encounter for general adult medical examination without abnormal findings: Secondary | ICD-10-CM | POA: Diagnosis not present

## 2016-12-17 DIAGNOSIS — E7800 Pure hypercholesterolemia, unspecified: Secondary | ICD-10-CM | POA: Diagnosis not present

## 2016-12-17 DIAGNOSIS — D508 Other iron deficiency anemias: Secondary | ICD-10-CM

## 2016-12-17 DIAGNOSIS — R7309 Other abnormal glucose: Secondary | ICD-10-CM | POA: Diagnosis not present

## 2016-12-17 DIAGNOSIS — F339 Major depressive disorder, recurrent, unspecified: Secondary | ICD-10-CM

## 2016-12-17 DIAGNOSIS — R69 Illness, unspecified: Secondary | ICD-10-CM | POA: Diagnosis not present

## 2016-12-18 LAB — CBC WITH DIFFERENTIAL/PLATELET
Basophils Absolute: 30 cells/uL (ref 0–200)
Basophils Relative: 0.6 %
EOS ABS: 90 {cells}/uL (ref 15–500)
Eosinophils Relative: 1.8 %
HEMATOCRIT: 37.4 % (ref 35.0–45.0)
Hemoglobin: 11.8 g/dL (ref 11.7–15.5)
LYMPHS ABS: 2070 {cells}/uL (ref 850–3900)
MCH: 25.9 pg — AB (ref 27.0–33.0)
MCHC: 31.6 g/dL — AB (ref 32.0–36.0)
MCV: 82.2 fL (ref 80.0–100.0)
MPV: 10.9 fL (ref 7.5–12.5)
Monocytes Relative: 6.8 %
NEUTROS PCT: 49.4 %
Neutro Abs: 2470 cells/uL (ref 1500–7800)
PLATELETS: 307 10*3/uL (ref 140–400)
RBC: 4.55 10*6/uL (ref 3.80–5.10)
RDW: 14.1 % (ref 11.0–15.0)
TOTAL LYMPHOCYTE: 41.4 %
WBC: 5 10*3/uL (ref 3.8–10.8)
WBCMIX: 340 {cells}/uL (ref 200–950)

## 2016-12-18 LAB — COMPLETE METABOLIC PANEL WITH GFR
AG RATIO: 1.4 (calc) (ref 1.0–2.5)
ALT: 8 U/L (ref 6–29)
AST: 14 U/L (ref 10–35)
Albumin: 3.7 g/dL (ref 3.6–5.1)
Alkaline phosphatase (APISO): 95 U/L (ref 33–130)
BUN: 13 mg/dL (ref 7–25)
CHLORIDE: 104 mmol/L (ref 98–110)
CO2: 29 mmol/L (ref 20–32)
Calcium: 8.8 mg/dL (ref 8.6–10.4)
Creat: 0.85 mg/dL (ref 0.60–0.93)
GFR, EST NON AFRICAN AMERICAN: 69 mL/min/{1.73_m2} (ref >=60)
GFR, Est African American: 80 mL/min/{1.73_m2} (ref >=60)
GLUCOSE: 97 mg/dL (ref 65–99)
Globulin: 2.6 g/dL (calc) (ref 1.9–3.7)
POTASSIUM: 4.1 mmol/L (ref 3.5–5.3)
Sodium: 139 mmol/L (ref 135–146)
TOTAL PROTEIN: 6.3 g/dL (ref 6.1–8.1)
Total Bilirubin: 0.5 mg/dL (ref 0.2–1.2)

## 2016-12-18 LAB — LIPID PANEL
CHOLESTEROL: 189 mg/dL (ref <200)
HDL: 52 mg/dL (ref >50)
LDL Cholesterol (Calc): 115 mg/dL (calc) — ABNORMAL HIGH
NON-HDL CHOLESTEROL (CALC): 137 mg/dL — AB (ref <130)
TRIGLYCERIDES: 113 mg/dL (ref <150)
Total CHOL/HDL Ratio: 3.6 (calc) (ref <5.0)

## 2016-12-18 LAB — HEMOGLOBIN A1C
Hgb A1c MFr Bld: 5.4 % of total Hgb (ref <5.7)
Mean Plasma Glucose: 108 (calc)
eAG (mmol/L): 6 (calc)

## 2016-12-20 DIAGNOSIS — S52501D Unspecified fracture of the lower end of right radius, subsequent encounter for closed fracture with routine healing: Secondary | ICD-10-CM | POA: Diagnosis not present

## 2016-12-22 DIAGNOSIS — S52501D Unspecified fracture of the lower end of right radius, subsequent encounter for closed fracture with routine healing: Secondary | ICD-10-CM | POA: Diagnosis not present

## 2017-01-05 ENCOUNTER — Ambulatory Visit (INDEPENDENT_AMBULATORY_CARE_PROVIDER_SITE_OTHER): Payer: Medicare HMO | Admitting: Family Medicine

## 2017-01-05 ENCOUNTER — Encounter: Payer: Self-pay | Admitting: Family Medicine

## 2017-01-05 ENCOUNTER — Ambulatory Visit
Admission: RE | Admit: 2017-01-05 | Discharge: 2017-01-05 | Disposition: A | Discharge status: Home / Self Care | Payer: Medicare HMO | Source: Ambulatory Visit | Attending: Family Medicine | Admitting: Family Medicine

## 2017-01-05 VITALS — BP 119/60 | HR 66 | Temp 98.2°F | Resp 16 | Ht 63.5 in | Wt 208.6 lb

## 2017-01-05 DIAGNOSIS — K219 Gastro-esophageal reflux disease without esophagitis: Secondary | ICD-10-CM | POA: Diagnosis not present

## 2017-01-05 DIAGNOSIS — K582 Mixed irritable bowel syndrome: Secondary | ICD-10-CM | POA: Diagnosis not present

## 2017-01-05 DIAGNOSIS — K449 Diaphragmatic hernia without obstruction or gangrene: Secondary | ICD-10-CM | POA: Diagnosis not present

## 2017-01-05 DIAGNOSIS — R1013 Epigastric pain: Secondary | ICD-10-CM

## 2017-01-05 DIAGNOSIS — R143 Flatulence: Secondary | ICD-10-CM | POA: Diagnosis not present

## 2017-01-05 DIAGNOSIS — R1084 Generalized abdominal pain: Secondary | ICD-10-CM | POA: Diagnosis not present

## 2017-01-05 LAB — CBC WITH DIFFERENTIAL/PLATELET
BASOS ABS: 32 {cells}/uL (ref 0–200)
Basophils Relative: 0.6 %
Eosinophils Absolute: 111 cells/uL (ref 15–500)
Eosinophils Relative: 2.1 %
HCT: 37.1 % (ref 35.0–45.0)
HEMOGLOBIN: 11.6 g/dL — AB (ref 11.7–15.5)
Lymphs Abs: 2263 cells/uL (ref 850–3900)
MCH: 26.2 pg — ABNORMAL LOW (ref 27.0–33.0)
MCHC: 31.3 g/dL — AB (ref 32.0–36.0)
MCV: 83.7 fL (ref 80.0–100.0)
MPV: 10.3 fL (ref 7.5–12.5)
Monocytes Relative: 7.3 %
NEUTROS ABS: 2507 {cells}/uL (ref 1500–7800)
Neutrophils Relative %: 47.3 %
PLATELETS: 280 10*3/uL (ref 140–400)
RBC: 4.43 10*6/uL (ref 3.80–5.10)
RDW: 14.4 % (ref 11.0–15.0)
TOTAL LYMPHOCYTE: 42.7 %
WBC mixed population: 387 cells/uL (ref 200–950)
WBC: 5.3 10*3/uL (ref 3.8–10.8)

## 2017-01-05 LAB — BASIC METABOLIC PANEL WITH GFR
BUN: 9 mg/dL (ref 7–25)
CHLORIDE: 105 mmol/L (ref 98–110)
CO2: 27 mmol/L (ref 20–32)
CREATININE: 0.76 mg/dL (ref 0.60–0.93)
Calcium: 8.6 mg/dL (ref 8.6–10.4)
GFR, Est African American: 91 mL/min/{1.73_m2} (ref >=60)
GFR, Est Non African American: 79 mL/min/{1.73_m2} (ref >=60)
GLUCOSE: 94 mg/dL (ref 65–99)
POTASSIUM: 4 mmol/L (ref 3.5–5.3)
SODIUM: 140 mmol/L (ref 135–146)

## 2017-01-05 MED ORDER — SUCRALFATE 1 G PO TABS: 1.0000 g | ORAL_TABLET | Freq: Three times a day (TID) | ORAL | 0 refills | Status: DC

## 2017-01-05 MED ORDER — OMEPRAZOLE 40 MG PO CPDR: 40.0000 mg | DELAYED_RELEASE_CAPSULE | Freq: Every day | ORAL | 2 refills | Status: DC

## 2017-01-05 MED ORDER — DICYCLOMINE HCL 10 MG PO CAPS: 10.0000 mg | ORAL_CAPSULE | Freq: Three times a day (TID) | ORAL | 0 refills | Status: DC

## 2017-01-05 NOTE — Patient Instructions (Addendum)
Thank you for coming to the clinic today.  I am not sure the exact cause of your abdominal pain, however I am concerned that one significant possibility could be uncontrolled Acid Reflux (GERD) and may have developed an Ulcer (Peptic Ulcer of stomach).  After you have completed your H Pylori Breath Test today, we can start with the prescribed medicines, and we will notify you of your result and if we have to change treatment.  Before starting the prescribed treatment you need to complete the H Pylori Breath Test, either back at our office as soon as possible (Lab is open morning only 8am to 12noon, need a lab only appointment scheduled, and you need to be FASTING for 4 hours before the test, NO FOOD OR DRINK, only small amount of water is fine, can take other medicines).  IF BREATH TEST = NEGATIVE (or waiting for results): Keep taking Omeprazole every day if helping as discussed  Take other prescribed medicine Carafate (Sucralfate) as needed up to 4 times daily (3 meals and bedtime)  to coat stomach lining to ease symptoms, if it helps reduce symptoms then it is more likely to be due to acid and/or ulcer.  IF BREATH TEST = POSITIVE (we notified you of this result): - CHANGE Omeprazole dose from 40mg  daily to 40mg  (one pill) TWICE daily, about 30 min before breakfast and dinner - FOR TWO WEEKS, then resume DAILY treatment - Also we will send in TWO antibiotics to take IN ADDITION: PCN Allergy Metronidazole 500mg  TID / Clarithromycin 500mg  TWICE daily - both for 2 weeks  DIET RECOMMENDATIONS - Avoid spicy, greasy, fried foods, also things like caffeine, dark chocolate, peppermint can worsen - Avoid large meals and late night snacks, also do not go more than 4-5 hours without a snack or meal (not eating will worsen reflux symptoms due to stomach acid) - You may also elevate the head of your bed at night to sleep at very slight incline to help reduce symptoms  If the problem improves but keeps  coming back, we can discuss higher dose or longer course at next visit.  If symptoms are worsening or persistent despite treatment or develop any different severe esophagus or abdominal pain, unable to swallow solids or liquids, nausea, vomiting especially blood in vomit, fever/chills, or unintentional weight loss / no appetite, please follow-up sooner in office or seek more immediate medical attention at hospital Emergency Department.  Regarding other medicines:  - STOP taking Ibuprofen, Advil, Motrin, Goody's / BC powder - DO NOT take without discussing with your doctor. These medicines can put you at high risk for future bleeding.  If need pain medicine, may take Tylenol Extra Strength (Acetaminophen) 500mg  tabs - take 1 to 2 tabs per dose (max 1000mg ) every 6-8 hours for pain (take regularly, don't skip a dose for next 7 days), max 24 hour daily dose is 6 tablets or 3000mg . In the future you can repeat the same everyday Tylenol course for 1-2 weeks at a time.  Please schedule a Follow-up Appointment to: Return in about 1 week (around 01/12/2017), or if symptoms worsen or fail to improve, for Abdominal Pain.  If you have any other questions or concerns, please feel free to call the clinic or send a message through [place]. You may also schedule an earlier appointment if necessary.  Additionally, you may be receiving a survey about your experience at our clinic within a few days to 1 week by e-mail or mail. We value your  feedback.  Nobie Putnam, DO [place]

## 2017-01-05 NOTE — Progress Notes (Signed)
Subjective:    Patient ID: Kelly Weber, female    DOB: 10-01-1945, 71 y.o.   MRN: [id number]  Kelly Weber is a 71 y.o. female presenting on 01/05/2017 for Abdominal Pain (felt like belching on Friday, had constipation taken imodium, was little Dizzy on Sunday and had diarrhea yesterday and having hot flashes today)  Patient presents for a same day appointment.  HPI   EPIGASTRIC PAIN / BLOATING /GERD - Last visit with me 09/01/16, for GERD, treated with continued PPI Omeprazole 40mg  QOD and future plans to taper off, see prior notes for background information. - Interval update with she has weaned off PPI completely in 09/2016, now off for 3-4 months. - Today patient reports new concern with abdominal pain. New onset symptoms 6 days ago with epigastric and mid abdominal pain seems to be constant without intermittent episodes, mild to moderate worse after eating, bloating, belching, gas, worse with eating. Did not eat anything this morning. Recurrence of symptoms if eat or drink anything. - Additionally reports history of IBS (chart review had IBD but this was proven otherwise per GI), has history of known symptoms of chronic diarrhea, had been followed by [facility] Dr Allen Norris, was advised to avoid dairy products due to worsening diarrhea, also has mixed constipation. Last EGD / Colonoscopy 11/2014 - Last bowel movement small dry BM on Monday, difficulty with some constipation, until yesterday with loose watery stool / diarrhea, similar to previous episodes, IBS mixed with constipation and diarrhea - No abdominal surgeries - Tried Gas-X, limited relief - Off of iron, was taking before for anemia - Denies any fevers/chills, chest pain, dyspnea, blood in stool or dark stool, nausea vomiting    Depression screen [facility] 2/9 01/05/2017 09/01/2016 03/22/2016  Decreased Interest 1 3 1   Down, Depressed, Hopeless 2 2 0  PHQ - 2 Score 3 5 1   Altered sleeping 3 3 -  Tired, decreased energy 3 2 -  Change in  appetite 0 0 -  Feeling bad or failure about yourself  1 1 -  Trouble concentrating 1 0 -  Moving slowly or fidgety/restless 0 0 -  Suicidal thoughts 0 0 -  PHQ-9 Score 11 11 -  Difficult doing work/chores Not difficult at all - -    Social History  Substance Use Topics  . Smoking status: Former Smoker    Packs/day: 1.00    Years: 30.00    Types: Cigarettes    Quit date: 01/19/2014  . Smokeless tobacco: Never Used  . Alcohol use No    Review of Systems Per HPI unless specifically indicated above     Objective:    BP 119/60   Pulse 66   Temp 98.2 F (36.8 C) (Oral)   Resp 16   Ht 5' 3.5" (1.613 m)   Wt 208 lb 9.6 oz (94.6 kg)   BMI 36.37 kg/m   Wt Readings from Last 3 Encounters:  01/05/17 208 lb 9.6 oz (94.6 kg)  11/26/16 200 lb (90.7 kg)  09/01/16 210 lb 6.4 oz (95.4 kg)    Physical Exam  Constitutional: She is oriented to person, place, and time. She appears well-developed and well-nourished. No distress.  Mostly well appearing but slightly uncomfortable at times, cooperative  HENT:  Head: Normocephalic and atraumatic.  Mouth/Throat: Oropharynx is clear and moist.  Eyes: Conjunctivae are normal. Right eye exhibits no discharge. Left eye exhibits no discharge.  Neck: Normal range of motion. Neck supple.  Cardiovascular: Normal rate, regular rhythm,  normal heart sounds and intact distal pulses.   No murmur heard. Pulmonary/Chest: Effort normal and breath sounds normal. No respiratory distress. She has no wheezes. She has no rales.  Abdominal: Soft. Bowel sounds are normal. She exhibits distension (mildly distended general abdomen). She exhibits no mass. There is tenderness (localized epigastric mild tender, not worse or provoked by palpation). There is no rebound and no guarding.  RUQ mild tender. Murphy's sign questionable on inspiration, did not seem to provoke.  Musculoskeletal: She exhibits no edema.  Right wrist / forearm lace up splint on  Neurological:  She is alert and oriented to person, place, and time.  Skin: Skin is warm and dry. No rash noted. She is not diaphoretic. No erythema.  Psychiatric: She has a normal mood and affect. Her behavior is normal.  Well groomed, good eye contact, normal speech and thoughts  Nursing note and vitals reviewed.  Results for orders placed or performed in visit on 12/17/16  COMPLETE METABOLIC PANEL WITH GFR  Result Value Ref Range   Glucose, Bld 97 65 - 99 mg/dL   BUN 13 7 - 25 mg/dL   Creat 0.85 0.60 - 0.93 mg/dL   GFR, Est Non African American 69 > OR = 60 mL/min/1.61m2   GFR, Est African American 80 > OR = 60 mL/min/1.37m2   BUN/Creatinine Ratio NOT APPLICABLE 6 - 22 (calc)   Sodium 139 135 - 146 mmol/L   Potassium 4.1 3.5 - 5.3 mmol/L   Chloride 104 98 - 110 mmol/L   CO2 29 20 - 32 mmol/L   Calcium 8.8 8.6 - 10.4 mg/dL   Total Protein 6.3 6.1 - 8.1 g/dL   Albumin 3.7 3.6 - 5.1 g/dL   Globulin 2.6 1.9 - 3.7 g/dL (calc)   AG Ratio 1.4 1.0 - 2.5 (calc)   Total Bilirubin 0.5 0.2 - 1.2 mg/dL   Alkaline phosphatase (APISO) 95 33 - 130 U/L   AST 14 10 - 35 U/L   ALT 8 6 - 29 U/L  Lipid panel  Result Value Ref Range   Cholesterol 189 <200 mg/dL   HDL 52 >50 mg/dL   Triglycerides 113 <150 mg/dL   LDL Cholesterol (Calc) 115 (H) mg/dL (calc)   Total CHOL/HDL Ratio 3.6 <5.0 (calc)   Non-HDL Cholesterol (Calc) 137 (H) <130 mg/dL (calc)  CBC with Differential/Platelet  Result Value Ref Range   WBC 5.0 3.8 - 10.8 Thousand/uL   RBC 4.55 3.80 - 5.10 Million/uL   Hemoglobin 11.8 11.7 - 15.5 g/dL   HCT 37.4 35.0 - 45.0 %   MCV 82.2 80.0 - 100.0 fL   MCH 25.9 (L) 27.0 - 33.0 pg   MCHC 31.6 (L) 32.0 - 36.0 g/dL   RDW 14.1 11.0 - 15.0 %   Platelets 307 140 - 400 Thousand/uL   MPV 10.9 7.5 - 12.5 fL   Neutro Abs 2,470 1,500 - 7,800 cells/uL   Lymphs Abs 2,070 850 - 3,900 cells/uL   WBC mixed population 340 200 - 950 cells/uL   Eosinophils Absolute 90 15 - 500 cells/uL   Basophils Absolute 30 0 -  200 cells/uL   Neutrophils Relative % 49.4 %   Total Lymphocyte 41.4 %   Monocytes Relative 6.8 %   Eosinophils Relative 1.8 %   Basophils Relative 0.6 %  Hemoglobin A1c  Result Value Ref Range   Hgb A1c MFr Bld 5.4 <5.7 % of total Hgb   Mean Plasma Glucose 108 (calc)   eAG (mmol/L)  6.0 (calc)      Assessment & Plan:   Problem List Items Addressed This Visit    GERD (gastroesophageal reflux disease) - Primary   Relevant Medications   sucralfate (CARAFATE) 1 g tablet   omeprazole (PRILOSEC) 40 MG capsule   dicyclomine (BENTYL) 10 MG capsule   Other Relevant Orders   H. pylori breath test   Hiatal hernia   Relevant Medications   omeprazole (PRILOSEC) 40 MG capsule   Irritable bowel syndrome (IBS)   Relevant Medications   sucralfate (CARAFATE) 1 g tablet   omeprazole (PRILOSEC) 40 MG capsule   dicyclomine (BENTYL) 10 MG capsule   Other Relevant Orders   DG Abd 1 View    Other Visit Diagnoses    Epigastric abdominal pain       Relevant Medications   sucralfate (CARAFATE) 1 g tablet   omeprazole (PRILOSEC) 40 MG capsule   dicyclomine (BENTYL) 10 MG capsule   Other Relevant Orders   DG Abd 1 View   CBC with Differential/Platelet   BASIC METABOLIC PANEL WITH GFR   H. pylori breath test  Suspect possible uncontrolled GERD now off PPI 3-4 months and possible PUD given constant epigastric pain and postprandial worsening - Off NSAIDs now with persistent symptoms. No other significant GI red flags (no unintentional wt loss, night-sweats, hematemesis or melena).  Plan: - Ordered KUB STAT, however patient left before x-ray imaging obtained, attempted to call patient back and she was not reached - Check BMET, CBC, recent labs were unremarkable 1. Resume PPI Omeprazole 40mg  daily - may need to resume long-term, otherwise can try for up to 8 weeks first 2. Today check H pylori breath test (given in office), confirmed off PPI 3-4 months - if positive, will increase PPI to BID  dosing, add on triple therapy antibiotics with Metronidazole 500mg  TID (PCN allergy) and clarithromycin (500mg  BID) for 2 weeks 3. Start carafate 1g TID WC + QHS PRN for symptom relief - Trial on Dicyclomine 10mg  TID WC and QHS PRN abdominal pain and cramping for IBS 4. Strict return criteria given for worsening symptoms 5. Follow-up within 1-2 weeks, if no improvement, may return to GI for further evaluation       Meds ordered this encounter  Medications  .       . sucralfate (CARAFATE) 1 g tablet    Sig: Take 1 tablet (1 g total) by mouth 4 (four) times daily -  with meals and at bedtime.    Dispense:  40 tablet    Refill:  0  . omeprazole (PRILOSEC) 40 MG capsule    Sig: Take 1 capsule (40 mg total) by mouth daily. For 4-6 weeks then may take every other day if helping    Dispense:  30 capsule    Refill:  2  . dicyclomine (BENTYL) 10 MG capsule    Sig: Take 1 capsule (10 mg total) by mouth 4 (four) times daily -  before meals and at bedtime.    Dispense:  30 capsule    Refill:  0    Follow up plan: Return in about 1 week (around 01/12/2017), or if symptoms worsen or fail to improve, for Abdominal Pain.  Nobie Putnam, [facility] 01/05/2017, 1:30 PM

## 2017-01-06 LAB — H. PYLORI BREATH TEST: H. PYLORI BREATH TEST: NOT DETECTED

## 2017-01-14 ENCOUNTER — Other Ambulatory Visit: Payer: Self-pay

## 2017-01-14 DIAGNOSIS — R1013 Epigastric pain: Secondary | ICD-10-CM

## 2017-01-14 DIAGNOSIS — K219 Gastro-esophageal reflux disease without esophagitis: Secondary | ICD-10-CM

## 2017-01-14 DIAGNOSIS — K582 Mixed irritable bowel syndrome: Secondary | ICD-10-CM

## 2017-01-14 MED ORDER — DICYCLOMINE HCL 10 MG PO CAPS: 10.0000 mg | ORAL_CAPSULE | Freq: Three times a day (TID) | ORAL | 0 refills | Status: DC

## 2017-01-14 MED ORDER — SUCRALFATE 1 G PO TABS: 1.0000 g | ORAL_TABLET | Freq: Three times a day (TID) | ORAL | 0 refills | Status: DC

## 2017-02-10 ENCOUNTER — Other Ambulatory Visit: Payer: Self-pay | Admitting: Family Medicine

## 2017-02-10 DIAGNOSIS — K219 Gastro-esophageal reflux disease without esophagitis: Secondary | ICD-10-CM

## 2017-02-10 DIAGNOSIS — R1013 Epigastric pain: Secondary | ICD-10-CM

## 2017-02-11 ENCOUNTER — Other Ambulatory Visit: Payer: Self-pay | Admitting: Family Medicine

## 2017-02-11 DIAGNOSIS — K582 Mixed irritable bowel syndrome: Secondary | ICD-10-CM

## 2017-02-11 DIAGNOSIS — R1013 Epigastric pain: Secondary | ICD-10-CM

## 2017-05-03 DIAGNOSIS — R69 Illness, unspecified: Secondary | ICD-10-CM | POA: Diagnosis not present

## 2017-05-09 ENCOUNTER — Telehealth: Payer: Self-pay | Admitting: Family Medicine

## 2017-05-09 DIAGNOSIS — G4701 Insomnia due to medical condition: Secondary | ICD-10-CM

## 2017-05-09 DIAGNOSIS — F339 Major depressive disorder, recurrent, unspecified: Secondary | ICD-10-CM

## 2017-05-09 NOTE — Telephone Encounter (Signed)
Something was discussed about pt having something called in to help her sleep and asked if she needed an appt.  [phone]

## 2017-05-10 ENCOUNTER — Telehealth: Payer: Self-pay

## 2017-05-10 DIAGNOSIS — G4701 Insomnia due to medical condition: Secondary | ICD-10-CM

## 2017-05-10 DIAGNOSIS — F339 Major depressive disorder, recurrent, unspecified: Secondary | ICD-10-CM

## 2017-05-10 MED ORDER — TRAZODONE HCL 50 MG PO TABS: 50.0000 mg | ORAL_TABLET | Freq: Every day | ORAL | 2 refills | Status: DC

## 2017-05-10 NOTE — Telephone Encounter (Signed)
Last time we discussed her mood / depression / insomnia and possible sleep medicine was in May 2018. Her other meds were adjusted first, and then if not improved plan was to follow-up. I have seen her in September for other issues.  If you could, call her back and give her the following options:  1. As per our discussion, I could send in a rx for Trazodone, take one at bedtime for next 3-4 weeks as it takes time to work but does primarily help improve her sleep and mood. She would still take the Sertraline (Zoloft) if she is still taking it. Then I would ask that she schedule an office visit for follow-up on her progress after about 4 weeks of starting med.  2. Otherwise, we could see her for an office visit first within 1-2 weeks, and then wait to prescribe the med before discussing her options again.  Let me know which option she chooses.  Kelly Weber, [facility] 05/10/2017, 6:50 AM

## 2017-05-10 NOTE — Telephone Encounter (Signed)
Attempted to contact the pt, no answer. LMOM to return my call.  

## 2017-05-10 NOTE — Telephone Encounter (Signed)
Patient want TraZodone Rx send to [facility] [place]

## 2017-05-10 NOTE — Telephone Encounter (Signed)
The pt would like to start the Trazodone and was informed that she needs to f/u in 4 weeks to discuss how the medication is working. She informed me that she weaned herself off of the Zoloft back in November. Her insomnia been going on for awhile, but have worsen in the last month.

## 2017-05-10 NOTE — Telephone Encounter (Signed)
Acknowledge that she weaned off Zoloft in November 2018 now.  Agree with plan to trial on Trazodone for sleep and mood as well.  New rx sent - Trazodone 50mg , may dose adjust as needed  Follow-up within 3-6 weeks to discuss med  Nobie Putnam, [facility] 05/10/2017, 11:00 AM

## 2017-05-10 NOTE — Telephone Encounter (Signed)
See prior note  Trazodone was sent to [facility] as listed pharmacy.  [facility] - new [facility] sent, same instructions.  Called patient, she is aware to pick up at [place] 05/10/2017, 5:11 PM

## 2017-05-13 ENCOUNTER — Telehealth: Payer: Self-pay | Admitting: Family Medicine

## 2017-05-13 NOTE — Telephone Encounter (Signed)
I spoke w/ the patient and reminded her that from your previous message you stated the Trazodone could take 3-4 weeks before she starts to notice any improvement. I had the patient to schedule an appt to come in face-to-face to address the insomnia. The appt is scheduled on 05/17/2017.   The pt complains of blurry vision that started last night about 50 minutes after taking the medication. She also states she had some muscle tightness last night and the night before after taking the medication that resolves by the morning. No muscle weakness or slurred speech or any other symptoms present. Please advise

## 2017-05-13 NOTE — Telephone Encounter (Signed)
The pt was notified that if her symptoms worsen or doesn't improve over 24hrs she needs to seek medical care. She informed me that her blurry vision is starting to subsided. I also informed her to discontinue the Trazodone and keep f/u appt scheduled for next week.

## 2017-05-13 NOTE — Telephone Encounter (Signed)
Pt said the trazodone is not working.  She had trouble going to sleep, couldn't get relaxed and has blurred vision today [phone]

## 2017-05-13 NOTE — Telephone Encounter (Signed)
See documentation below with my recommendations. Reassuring if blurry vision is subsiding. We can discuss further med options in office next week. Possibly side effect to Trazodone, seems uncommon.  Nobie Putnam, [facility] 05/13/2017, 12:35 PM

## 2017-05-17 ENCOUNTER — Other Ambulatory Visit: Payer: Self-pay | Admitting: Family Medicine

## 2017-05-17 ENCOUNTER — Ambulatory Visit (INDEPENDENT_AMBULATORY_CARE_PROVIDER_SITE_OTHER): Payer: Medicare HMO | Admitting: Family Medicine

## 2017-05-17 ENCOUNTER — Encounter: Payer: Self-pay | Admitting: Family Medicine

## 2017-05-17 VITALS — BP 125/70 | HR 69 | Temp 98.1°F | Resp 16 | Ht 63.5 in | Wt 208.0 lb

## 2017-05-17 DIAGNOSIS — F339 Major depressive disorder, recurrent, unspecified: Secondary | ICD-10-CM

## 2017-05-17 DIAGNOSIS — G4701 Insomnia due to medical condition: Secondary | ICD-10-CM

## 2017-05-17 DIAGNOSIS — R69 Illness, unspecified: Secondary | ICD-10-CM | POA: Diagnosis not present

## 2017-05-17 DIAGNOSIS — E669 Obesity, unspecified: Secondary | ICD-10-CM

## 2017-05-17 DIAGNOSIS — D508 Other iron deficiency anemias: Secondary | ICD-10-CM

## 2017-05-17 DIAGNOSIS — E7800 Pure hypercholesterolemia, unspecified: Secondary | ICD-10-CM

## 2017-05-17 DIAGNOSIS — Z0000 Encounter for general adult medical examination without abnormal findings: Secondary | ICD-10-CM

## 2017-05-17 DIAGNOSIS — R7309 Other abnormal glucose: Secondary | ICD-10-CM

## 2017-05-17 MED ORDER — ZOLPIDEM TARTRATE 10 MG PO TABS: 5.0000 mg | ORAL_TABLET | Freq: Every evening | ORAL | 2 refills | Status: DC | PRN

## 2017-05-17 MED ORDER — ZALEPLON 5 MG PO CAPS: 5.0000 mg | ORAL_CAPSULE | Freq: Every evening | ORAL | 2 refills | Status: DC | PRN

## 2017-05-17 NOTE — Addendum Note (Signed)
Addended by: Olin Hauser on: 05/17/2017 05:34 PM   Modules accepted: Orders

## 2017-05-17 NOTE — Assessment & Plan Note (Signed)
Updated chart, not morbid obesity, based on BMI >36 Encourage improve lifestyle, diet/exercise, wt loss goal Improve general health and sleep

## 2017-05-17 NOTE — Assessment & Plan Note (Signed)
Improved MDD in interval, still seems not resolved or in remission due to existing life stressors, but seems more controlled. Now with primary difficulty of secondary insomnia. -PHQ9: 11, difficult >> unchanged score still 11, not difficult now Prior Meds: Sertraline 25mg , Escitalopram 20mg , Ambien, Trazodone 50mg  - No prior Psych / counseling, has church support and friends  Plan: 1. Discussion on chronic depression management, complications, likely contributing to insomnia - Offered restart SSRI at this time, since only taking for limited duration in past, and does not feel like worsening mood now. She has declined at this time - Agree to start hypnotic sleep agent - considered Ambien, not preferred by her insurance, switched to Zaleplon (Sonata) 5mg  QHS PRN #20 +2 refills for up to 3 months, not to take nightly but when needed - We considered alternative options with possible Ambien CR if need may try this next if needed instead - Future consider therapy or mental health Follow-up 6 wk to 3 months if not improved or worsening need med change / otherwise if stable give [place] update and can refill sleep med and then would plan to see back within 6-7 months for Annual Phys + labs

## 2017-05-17 NOTE — Assessment & Plan Note (Signed)
Uncontrolled insomnia, secondary to likely mood MDD -PHQ9: 11, difficult >> unchanged score still 11, not difficult now Prior Meds: Sertraline 25mg , Escitalopram 20mg , Ambien, Trazodone 50mg  - No prior Psych / counseling, has church support and friends  Plan: 1. Discussion on chronic depression management, complications, likely contributing to insomnia - Offered restart SSRI at this time, since only taking for limited duration in past, and does not feel like worsening mood now. She has declined at this time - Agree to start hypnotic sleep agent - considered Ambien, not preferred by her insurance, switched to Zaleplon (Sonata) 5mg  QHS PRN #20 +2 refills for up to 3 months, not to take nightly but when needed - We considered alternative options with possible Ambien CR if need may try this next if needed instead - Future consider therapy or mental health Follow-up 6 wk to 3 months if not improved or worsening need med change / otherwise if stable give [place] update and can refill sleep med and then would plan to see back within 6-7 months for Annual Phys + labs

## 2017-05-17 NOTE — Progress Notes (Addendum)
Subjective:    Patient ID: Kelly Weber, female    DOB: Nov 20, 1945, 72 y.o.   MRN: [id number]  Kelly Weber is a 72 y.o. female presenting on 05/17/2017 for Insomnia   HPI   FOLLOW-UP INSOMNIA / Depression, Major, Chronic recurrent: - Last visit with me 08/2016, for major depression mood follow-up, treated with resumed Sertraline 25 as she had previously been on this before. In paste as well had trial of Lexapro >1 year then stopped. Regarding sleep she has been on Ambien before 10mg  taking half pill only QHS with good results >2-3 years ago from prior provider, see prior notes for background information. - Interval update with she has called office recently 05/09/17 stated she tapered off Sertraline in November 2018 (after 6 months of therapy), did not want to take long term but did benefit from it. She requested something for sleep, we had discussed Trazodone in past, this rx was sent, but then she called within 2-3 days with med side effect stating that she had blurry vision and some muscle cramps. Thinks it was related to med, stopped taking and it resolved. - Today patient reports concern with still poor sleep, insomnia, requesting other medicine to help her sleep. - She did better on Ambien in the past, tolerated well - She describes briefly some of the prior life stressors that have been causing her depressive symptoms, in past related to family stressors, thinks this is affecting her sleep but regardless only getting 2-4 hours of sleep at night affecting her - In past has stated that she has faith based support system and Darrick Meigs friends to talk to, never seen psychiatry or therapist - Denies any suicidal or homicidal ideation  Health Maintenance:  Due for 2nd pneumonia vaccine >1 year after previous vaccine (due for pneumovax-23) - last received Prevnar-13 on 01/02/14, however she also received flu shot at same time and had reaction to this. She declined repeat pneumovax since -  Now  agrees to get it in future at next physical.   Depression screen [facility] 2/9 05/17/2017 01/05/2017 09/01/2016  Decreased Interest 2 1 3   Down, Depressed, Hopeless 1 2 2   PHQ - 2 Score 3 3 5   Altered sleeping 3 3 3   Tired, decreased energy 3 3 2   Change in appetite 0 0 0  Feeling bad or failure about yourself  2 1 1   Trouble concentrating 0 1 0  Moving slowly or fidgety/restless 0 0 0  Suicidal thoughts 0 0 0  PHQ-9 Score 11 11 11   Difficult doing work/chores Not difficult at all Not difficult at all -    Social History   Tobacco Use  . Smoking status: Former Smoker    Packs/day: 1.00    Years: 30.00    Pack years: 30.00    Types: Cigarettes    Last attempt to quit: 01/19/2014    Years since quitting: 3.3  . Smokeless tobacco: Never Used  Substance Use Topics  . Alcohol use: No    Alcohol/week: 0.0 oz  . Drug use: No    Review of Systems Per HPI unless specifically indicated above     Objective:    BP 125/70   Pulse 69   Temp 98.1 F (36.7 C) (Oral)   Resp 16   Ht 5' 3.5" (1.613 m)   Wt 208 lb (94.3 kg)   SpO2 96%   BMI 36.27 kg/m   Wt Readings from Last 3 Encounters:  05/17/17 208 lb (94.3  kg)  01/05/17 208 lb 9.6 oz (94.6 kg)  11/26/16 200 lb (90.7 kg)    Physical Exam  Constitutional: She is oriented to person, place, and time. She appears well-developed and well-nourished. No distress.  Well-appearing, comfortable, cooperative  HENT:  Head: Normocephalic and atraumatic.  Mouth/Throat: Oropharynx is clear and moist.  Eyes: Conjunctivae are normal. Right eye exhibits no discharge. Left eye exhibits no discharge.  Neck: Normal range of motion. Neck supple.  Cardiovascular: Normal rate, regular rhythm, normal heart sounds and intact distal pulses.  No murmur heard. Pulmonary/Chest: Effort normal.  Musculoskeletal: She exhibits no edema.  Neurological: She is alert and oriented to person, place, and time.  Skin: Skin is warm and dry. No rash noted. She is  not diaphoretic. No erythema.  Psychiatric: She has a normal mood and affect. Her behavior is normal.  Well groomed, good eye contact, normal speech and thoughts. Mood is appropriate and consistent with history. Not tearful today. Has good insight into history and mental health.  Nursing note and vitals reviewed.    Results for orders placed or performed in visit on 01/05/17  CBC with Differential/Platelet  Result Value Ref Range   WBC 5.3 3.8 - 10.8 Thousand/uL   RBC 4.43 3.80 - 5.10 Million/uL   Hemoglobin 11.6 (L) 11.7 - 15.5 g/dL   HCT 37.1 35.0 - 45.0 %   MCV 83.7 80.0 - 100.0 fL   MCH 26.2 (L) 27.0 - 33.0 pg   MCHC 31.3 (L) 32.0 - 36.0 g/dL   RDW 14.4 11.0 - 15.0 %   Platelets 280 140 - 400 Thousand/uL   MPV 10.3 7.5 - 12.5 fL   Neutro Abs 2,507 1,500 - 7,800 cells/uL   Lymphs Abs 2,263 850 - 3,900 cells/uL   WBC mixed population 387 200 - 950 cells/uL   Eosinophils Absolute 111 15 - 500 cells/uL   Basophils Absolute 32 0 - 200 cells/uL   Neutrophils Relative % 47.3 %   Total Lymphocyte 42.7 %   Monocytes Relative 7.3 %   Eosinophils Relative 2.1 %   Basophils Relative 0.6 %  BASIC METABOLIC PANEL WITH GFR  Result Value Ref Range   Glucose, Bld 94 65 - 99 mg/dL   BUN 9 7 - 25 mg/dL   Creat 0.76 0.60 - 0.93 mg/dL   GFR, Est Non African American 79 > OR = 60 mL/min/1.52m2   GFR, Est African American 91 > OR = 60 mL/min/1.36m2   BUN/Creatinine Ratio NOT APPLICABLE 6 - 22 (calc)   Sodium 140 135 - 146 mmol/L   Potassium 4.0 3.5 - 5.3 mmol/L   Chloride 105 98 - 110 mmol/L   CO2 27 20 - 32 mmol/L   Calcium 8.6 8.6 - 10.4 mg/dL  H. pylori breath test  Result Value Ref Range   H. pylori Breath Test NOT DETECTED NOT DETECT      Assessment & Plan:   Problem List Items Addressed This Visit    Insomnia due to medical condition - Primary    Uncontrolled insomnia, secondary to likely mood MDD -PHQ9: 11, difficult >> unchanged score still 11, not difficult now Prior Meds:  Sertraline 25mg , Escitalopram 20mg , Ambien, Trazodone 50mg  - No prior Psych / counseling, has church support and friends  Plan: 1. Discussion on chronic depression management, complications, likely contributing to insomnia - Offered restart SSRI at this time, since only taking for limited duration in past, and does not feel like worsening mood now. She has declined  at this time - Agree to start hypnotic sleep agent - considered Ambien, not preferred by her insurance, switched to Zaleplon (Sonata) 5mg  QHS PRN #20 +2 refills for up to 3 months, not to take nightly but when needed - We considered alternative options with possible Ambien CR if need may try this next if needed instead - Future consider therapy or mental health Follow-up 6 wk to 3 months if not improved or worsening need med change / otherwise if stable give [place] update and can refill sleep med and then would plan to see back within 6-7 months for Annual Phys + labs      Relevant Medications   zolpidem (AMBIEN) 10 MG tablet   Major depression, recurrent, chronic (HCC)    Improved MDD in interval, still seems not resolved or in remission due to existing life stressors, but seems more controlled. Now with primary difficulty of secondary insomnia. -PHQ9: 11, difficult >> unchanged score still 11, not difficult now Prior Meds: Sertraline 25mg , Escitalopram 20mg , Ambien, Trazodone 50mg  - No prior Psych / counseling, has church support and friends  Plan: 1. Discussion on chronic depression management, complications, likely contributing to insomnia - Offered restart SSRI at this time, since only taking for limited duration in past, and does not feel like worsening mood now. She has declined at this time - Agree to start hypnotic sleep agent - considered Ambien, not preferred by her insurance, switched to Zaleplon Proofreader) 5mg  QHS PRN #20 +2 refills for up to 3 months, not to take nightly but when needed - We considered alternative options  with possible Ambien CR if need may try this next if needed instead - Future consider therapy or mental health Follow-up 6 wk to 3 months if not improved or worsening need med change / otherwise if stable give [place] update and can refill sleep med and then would plan to see back within 6-7 months for Annual Phys + labs      Obesity (BMI 35.0-39.9 without comorbidity)    Updated chart, not morbid obesity, based on BMI >36 Encourage improve lifestyle, diet/exercise, wt loss goal Improve general health and sleep         Meds ordered this encounter  Medications  . DISCONTD: zaleplon (SONATA) 5 MG capsule    Sig: Take 1 capsule (5 mg total) by mouth at bedtime as needed for sleep.    Dispense:  20 capsule    Refill:  2  . zolpidem (AMBIEN) 10 MG tablet    Sig: Take 0.5 tablets (5 mg total) by mouth at bedtime as needed for sleep. Do not take whole tablet    Dispense:  15 tablet    Refill:  2    Discontinue Zaleplon due to insurance coverage/PA. Patient requests to pay out of pocket for Zolpidem.   UPDATE - Notified by patient could not fill Zaleplon per pharmacy, required prior authorization first. Called pharmacy [facility] reviewed with pharmacist, and recommended that most sleep agents were denied by Medicare, despite listed preferred agent, and one option would be to pay out of pocket for generic Zolpidem. Called patient, she could not wait up to 72 hours or up to 1 week for PA process approval, agreed with re-trying previous rx Zolpidem that she used to be one, was cutting 10mg  tabs in half for 5mg  nightly PRN.  DC'd Zaleplon Sent new rx e-script Zolpidem (Ambien) 10mg  tabs - take HALF tab dose = 5mg  only, at night PRN #15 +2 refills  Patient  notified of these updates. May still need PA for Zopidem will complete when available but patient to pay out of pocket now.   Follow up plan: Return in about 7 months (around 12/15/2017) for Annual Physical.  Future labs ordered for 12/2017  Nobie Putnam, [facility] 05/17/2017, 5:32 PM

## 2017-05-17 NOTE — Patient Instructions (Addendum)
Thank you for coming to the office today.  1. For insomnia and sleep, we have prescribed Zaleplon (Sonata) 5mg  at bedtime as needed 30 min before sleep. Caution with sedation and grogginess  As discussed if the mood is worsening may need to return to discuss this or consider other medicines in addition as well  We can consider other sleeping pills too, such as a sustained release longer acting Ambien to help you stay asleep INSTEAD if you prefer.  If cost is too high or not covered let me know, this was preferred by insurance   DUE for FASTING BLOOD WORK (no food or drink after midnight before the lab appointment, only water or coffee without cream/sugar on the morning of)  SCHEDULE "Lab Only" visit in the morning at the clinic for lab draw in 7 MONTHS   - Make sure Lab Only appointment is at about 1 week before your next appointment, so that results will be available  For Lab Results, once available within 2-3 days of blood draw, you can can log in to MyChart online to view your results and a brief explanation. Also, we can discuss results at next follow-up visit.  FOLLOW-UP SOONER 6 weeks to 3 months if medicine is not working or need to discuss med changes  Please schedule a Follow-up Appointment to: Return in about 7 months (around 12/15/2017) for Annual Physical.  If you have any other questions or concerns, please feel free to call the office or send a message through [place]. You may also schedule an earlier appointment if necessary.  Additionally, you may be receiving a survey about your experience at our office within a few days to 1 week by e-mail or mail. We value your feedback.  Nobie Putnam, DO [place]

## 2017-07-04 IMAGING — DX DG CHEST 2V
2 series · 2 of 2 positions shown · non-contrast
Comparison: Chest x-ray of January 17, 2015

CLINICAL DATA: Shortness of breath on exertion, occasional cough,
discontinue smoking 2 years ago

EXAM:
CHEST  2 VIEW

[chest pa]
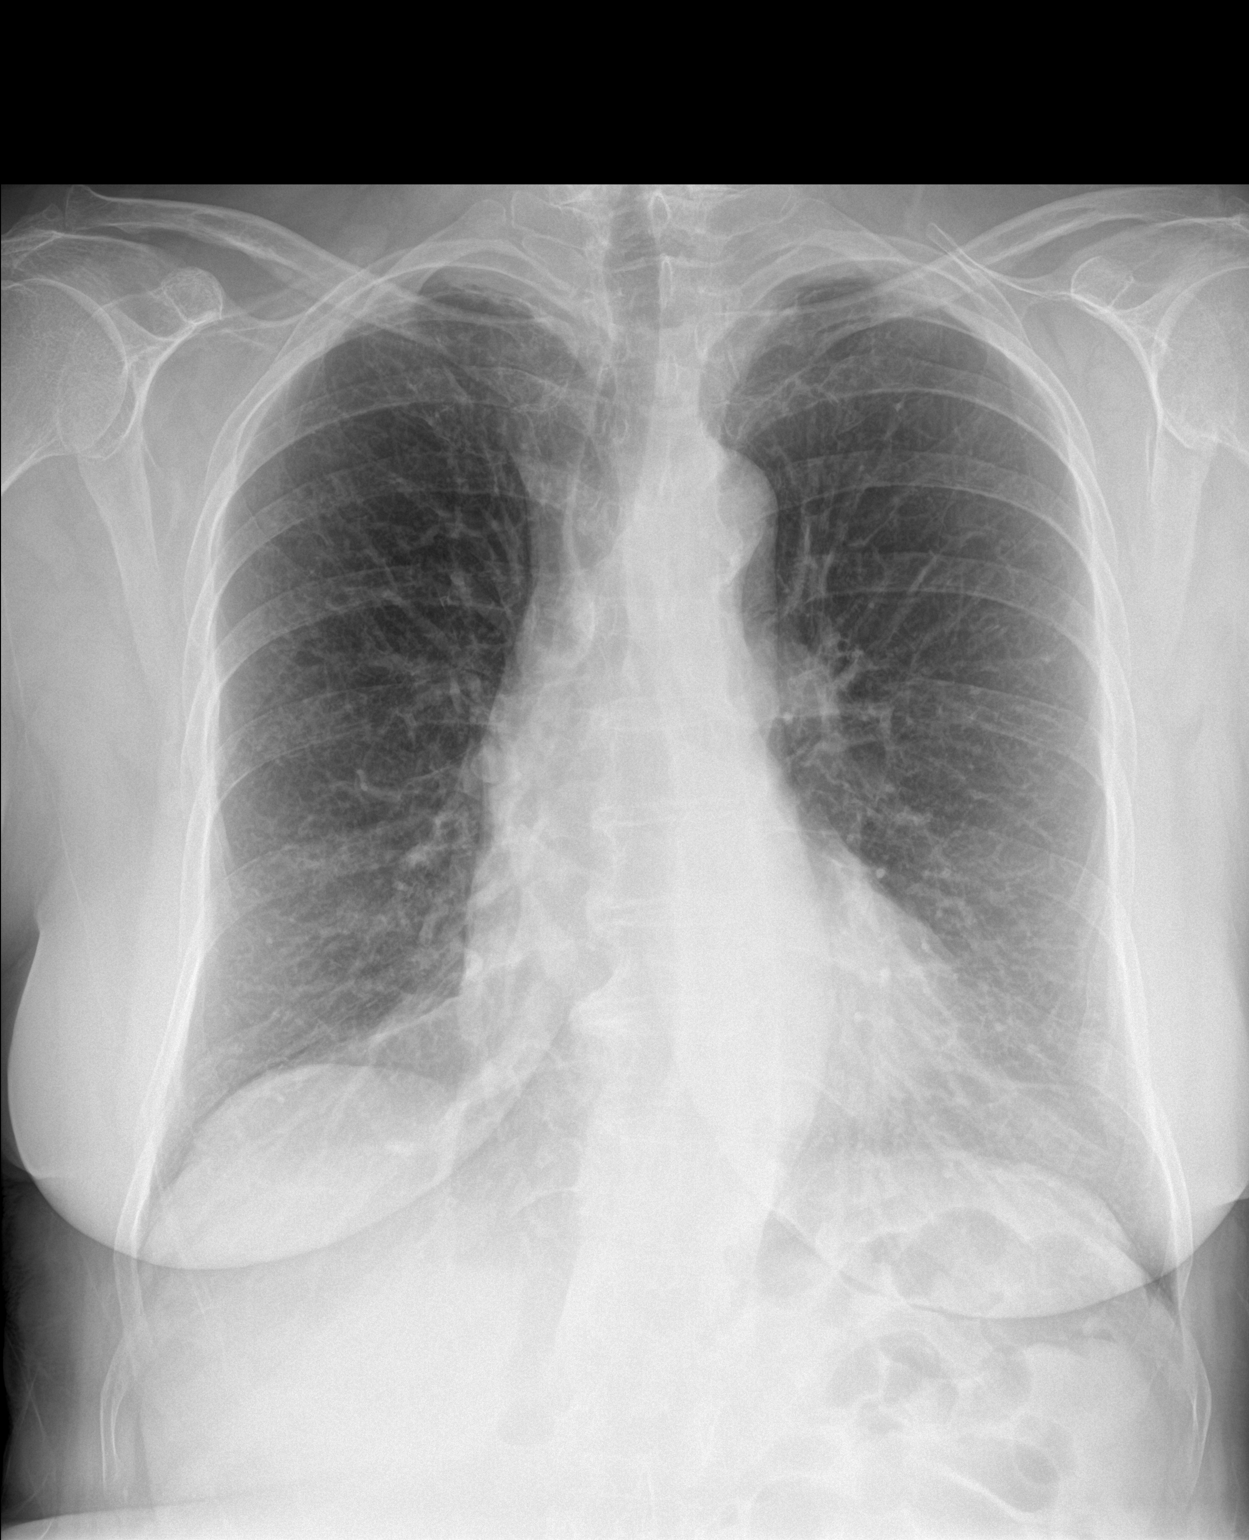

[chest lat]
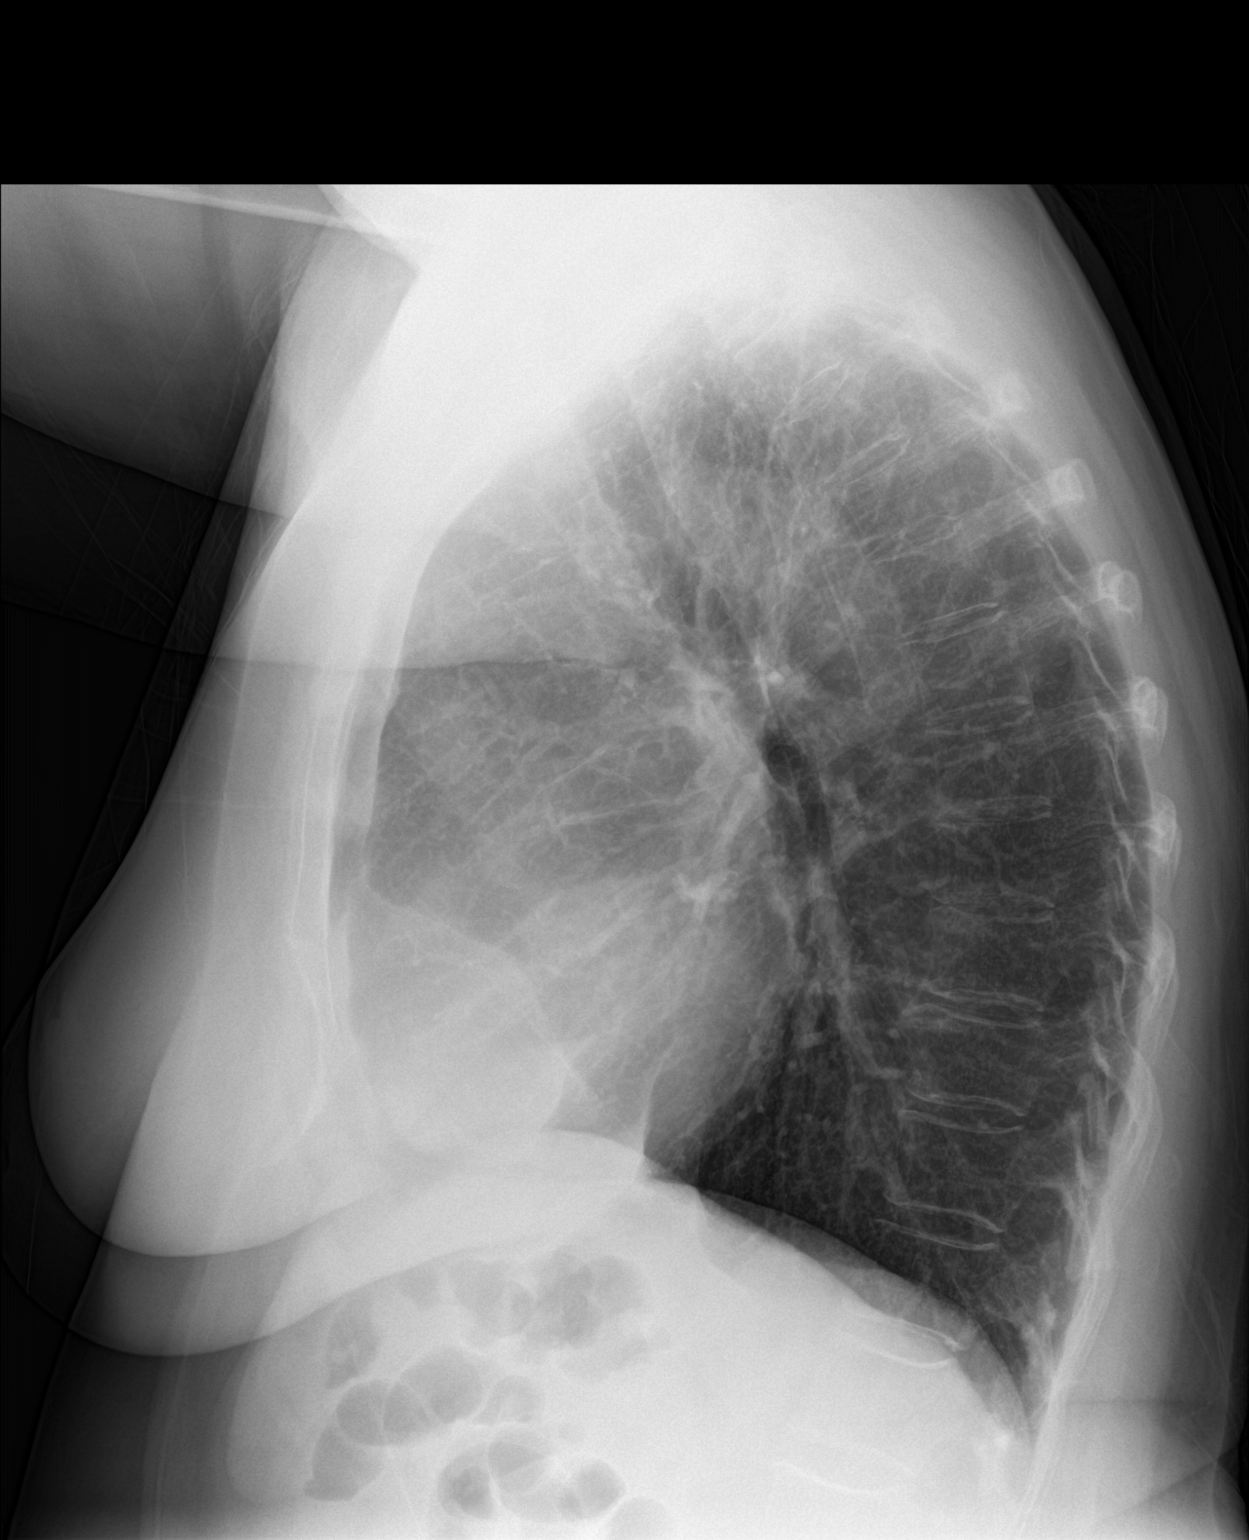

[2 of 2 positions shown; findings below may reference images not displayed]

FINDINGS: The lungs are mildly hyperinflated but clear. There is minimal
stable biapical pleural thickening. The heart and pulmonary
vascularity are normal. There is tortuosity of the descending
thoracic aorta. There is no pleural effusion. The bony thorax
exhibits no acute abnormality.
IMPRESSION: There is no acute cardiopulmonary abnormality.

## 2017-07-13 ENCOUNTER — Ambulatory Visit (INDEPENDENT_AMBULATORY_CARE_PROVIDER_SITE_OTHER): Payer: Medicare HMO | Admitting: Family Medicine

## 2017-07-13 ENCOUNTER — Encounter: Payer: Self-pay | Admitting: Family Medicine

## 2017-07-13 VITALS — BP 121/82 | HR 68 | Temp 98.5°F | Resp 16 | Ht 63.5 in | Wt 210.0 lb

## 2017-07-13 DIAGNOSIS — G4701 Insomnia due to medical condition: Secondary | ICD-10-CM | POA: Diagnosis not present

## 2017-07-13 DIAGNOSIS — F339 Major depressive disorder, recurrent, unspecified: Secondary | ICD-10-CM

## 2017-07-13 DIAGNOSIS — R69 Illness, unspecified: Secondary | ICD-10-CM | POA: Diagnosis not present

## 2017-07-13 MED ORDER — ZOLPIDEM TARTRATE 10 MG PO TABS: 5.0000 mg | ORAL_TABLET | Freq: Every evening | ORAL | 2 refills | Status: DC | PRN

## 2017-07-13 MED ORDER — VENLAFAXINE HCL ER 37.5 MG PO CP24: 37.5000 mg | ORAL_CAPSULE | Freq: Every day | ORAL | 5 refills | Status: DC

## 2017-07-13 NOTE — Patient Instructions (Addendum)
Thank you for coming to the office today.  New medicine for mood / anxiety / sleep - Effexor (Venlafaxine) 37.5mg  capsule once daily with food in morning.  May take 3-4 weeks to have full effect.  Refilled Zolpidem, remember to pay as cash, because not covered by insurance.  Please schedule a Follow-up Appointment to: Return in about 3 months (around 10/12/2017) for Depression PHQ / GAD / Insomnia med adjust.  If you have any other questions or concerns, please feel free to call the office or send a message through [place]. You may also schedule an earlier appointment if necessary.  Additionally, you may be receiving a survey about your experience at our office within a few days to 1 week by e-mail or mail. We value your feedback.  Nobie Putnam, DO [place]

## 2017-07-13 NOTE — Progress Notes (Signed)
Subjective:    Patient ID: Kelly Weber, female    DOB: 1945-07-04, 72 y.o.   MRN: [id number]  Kelly Weber is a 72 y.o. female presenting on 07/13/2017 for Insomnia   HPI   FOLLOW-UP INSOMNIA / Depression, Major, Chronic recurrent: - Last visit 05/17/17, she was initially started on Zaleplon Proofreader) due to ins preference but then ultimately cost was more effective on the generic Zolpidem, she was prescribed 10mg  tabs and cutting in half for 5mg , due to cost of medicine, she has been taking one of the half tabs Zolpidem for dose 5mg  most nights, feels like she needs it regularly to help get good rest still otherwise will wake up. She now will go to bed around 10pm and then will take med about 30 min to 1 hour before, she can get groggy and feels sleepy easily within 1 hour and is ready to sleep, she will sleep for about 6-7 hours overnight now on med, but waking up still early approx 4 am to 5am, and asking why, she is usually tired in evening and does not want to go to bed later - Today requests refill on Zolpidem - Weight change, she had wt loss down to 204 lbs, now ready to start walking more regularly with warmer weather, she went on vacation and then gained back - Regarding her mood, she has concerns again today about major depression, see PHQ score below seems improved but her clinical report does not match, describes episodes of sadness and depression mostly related to emotional lability with family and life stressors. Has crying spells at times. She has tried several meds in past, failed Trazodone, Lexapro, Zoloft, ultimately discontinued or tapered off these. Unsure if tried other SSRI. She has declined Psychiatry or Psychology referral in past and continues to do so. - In past has stated that she has faith based support system and Darrick Meigs friends to talk to - Denies any suicidal or homicidal ideation  Skin Tags Neck Followed by Dr Evorn Gong, [facility]. She has some concern  over several skin spots wanted them checked out. She noticed they "popped up" recently.  Depression screen [facility] 2/9 07/13/2017 05/17/2017 01/05/2017  Decreased Interest 1 2 1   Down, Depressed, Hopeless 1 1 2   PHQ - 2 Score 2 3 3   Altered sleeping 0 3 3  Tired, decreased energy 1 3 3   Change in appetite 2 0 0  Feeling bad or failure about yourself  1 2 1   Trouble concentrating 0 0 1  Moving slowly or fidgety/restless 0 0 0  Suicidal thoughts 0 0 0  PHQ-9 Score 6 11 11   Difficult doing work/chores Not difficult at all Not difficult at all Not difficult at all    Social History   Tobacco Use  . Smoking status: Former Smoker    Packs/day: 1.00    Years: 30.00    Pack years: 30.00    Types: Cigarettes    Last attempt to quit: 01/19/2014    Years since quitting: 3.4  . Smokeless tobacco: Never Used  Substance Use Topics  . Alcohol use: No    Alcohol/week: 0.0 oz  . Drug use: No    Review of Systems Per HPI unless specifically indicated above     Objective:    BP 121/82   Pulse 68   Temp 98.5 F (36.9 C) (Oral)   Resp 16   Ht 5' 3.5" (1.613 m)   Wt 210 lb (95.3 kg)  BMI 36.62 kg/m   Wt Readings from Last 3 Encounters:  07/13/17 210 lb (95.3 kg)  05/17/17 208 lb (94.3 kg)  01/05/17 208 lb 9.6 oz (94.6 kg)    Physical Exam  Constitutional: She is oriented to person, place, and time. She appears well-developed and well-nourished. No distress.  Well-appearing, comfortable, cooperative. Does not appear tired  HENT:  Head: Normocephalic and atraumatic.  Mouth/Throat: Oropharynx is clear and moist.  Eyes: Conjunctivae are normal. Right eye exhibits no discharge. Left eye exhibits no discharge.  Neck: Normal range of motion. Neck supple.  Cardiovascular: Normal rate, regular rhythm, normal heart sounds and intact distal pulses.  No murmur heard. Pulmonary/Chest: Effort normal.  Musculoskeletal: She exhibits no edema.  Neurological: She is alert and oriented to person,  place, and time.  Skin: Skin is warm and dry. No rash noted. She is not diaphoretic. No erythema.  Several minimal spots on neck bilateral seem consistent with inflamed skin tags  Psychiatric: She has a normal mood and affect. Her behavior is normal.  Well groomed, good eye contact, normal speech and thoughts. Mood is appropriate most of interview, at times she is tearful and sad mood. Has good insight into history and mental health.  Nursing note and vitals reviewed.  Results for orders placed or performed in visit on 01/05/17  CBC with Differential/Platelet  Result Value Ref Range   WBC 5.3 3.8 - 10.8 Thousand/uL   RBC 4.43 3.80 - 5.10 Million/uL   Hemoglobin 11.6 (L) 11.7 - 15.5 g/dL   HCT 37.1 35.0 - 45.0 %   MCV 83.7 80.0 - 100.0 fL   MCH 26.2 (L) 27.0 - 33.0 pg   MCHC 31.3 (L) 32.0 - 36.0 g/dL   RDW 14.4 11.0 - 15.0 %   Platelets 280 140 - 400 Thousand/uL   MPV 10.3 7.5 - 12.5 fL   Neutro Abs 2,507 1,500 - 7,800 cells/uL   Lymphs Abs 2,263 850 - 3,900 cells/uL   WBC mixed population 387 200 - 950 cells/uL   Eosinophils Absolute 111 15 - 500 cells/uL   Basophils Absolute 32 0 - 200 cells/uL   Neutrophils Relative % 47.3 %   Total Lymphocyte 42.7 %   Monocytes Relative 7.3 %   Eosinophils Relative 2.1 %   Basophils Relative 0.6 %  BASIC METABOLIC PANEL WITH GFR  Result Value Ref Range   Glucose, Bld 94 65 - 99 mg/dL   BUN 9 7 - 25 mg/dL   Creat 0.76 0.60 - 0.93 mg/dL   GFR, Est Non African American 79 > OR = 60 mL/min/1.33m2   GFR, Est African American 91 > OR = 60 mL/min/1.29m2   BUN/Creatinine Ratio NOT APPLICABLE 6 - 22 (calc)   Sodium 140 135 - 146 mmol/L   Potassium 4.0 3.5 - 5.3 mmol/L   Chloride 105 98 - 110 mmol/L   CO2 27 20 - 32 mmol/L   Calcium 8.6 8.6 - 10.4 mg/dL  H. pylori breath test  Result Value Ref Range   H. pylori Breath Test NOT DETECTED NOT DETECT      Assessment & Plan:   Problem List Items Addressed This Visit    Insomnia due to medical  condition    Improved control insomnia on regular Zolpidem 5mg  (half of 10), secondary to likely mood MDD -PHQ9: 11 to 6 not difficult, but clinically incongruent Prior Meds: Sertraline 25mg , Escitalopram 20mg , Ambien, Trazodone 50mg  - No prior Psych / counseling  Plan: 1. Discussion on  chronic depression management, complications, likely contributing to insomnia - START SNRI Venlafaxine - see A&P - Refill Zolpidem 10mg  tabs - take HALF for 5 mg nightly PRN, may skip some nights if able. Given 3 month supply - Emphasis on adjusting sleep schedule, she seems to have unreasonable expectations on how long she should be sleeping at night, goes to bed early and wakes up earlier, likely with age >79 and biological clock change can wake up earlier, she should adjust sleep schedule to go to bed later and sleep in later >6-7 hours per night, may be requiring more sleep if depression active - Future consider therapy or mental health Follow-up 3 months      Relevant Medications   zolpidem (AMBIEN) 10 MG tablet   Major depression, recurrent, chronic ([place]) - Primary    Uncertain if improved MDD in interval, PHQ is incongruent with clinical Seems family stressors primary issue, off SSRI and Trazodone now [place] insomnia Prior Meds: Sertraline 25mg , Escitalopram 20mg , Ambien, Trazodone 50mg  - No prior Psych / counseling, has church support and friends  Plan: 1. Discussion on chronic depression management, complications, likely contributing to insomnia - START SNRI Venlafaxine 37.5mg  capsule (24hr) reviewed options on other possible meds, she has hesitation on taking fluoxetine and other SSRI meds she knows that some people have not done well on these meds. Reviewed benefits, side effects, and goal, may need dose inc in future if effective - Again offered referral for 2nd opinion from psychiatry / psychologist therapy but she is not interested at this time. Advised that if we are unable to get  improvement on several med trials, then limited options and next step would be referral - Follow-up 3 months if doing well, call or return sooner within 4-6 weeks if need med adjust      Relevant Medications   venlafaxine XR (EFFEXOR XR) 37.5 MG 24 hr capsule      Meds ordered this encounter  Medications  . venlafaxine XR (EFFEXOR XR) 37.5 MG 24 hr capsule    Sig: Take 1 capsule (37.5 mg total) by mouth daily with breakfast.    Dispense:  30 capsule    Refill:  5  . zolpidem (AMBIEN) 10 MG tablet    Sig: Take 0.5 tablets (5 mg total) by mouth at bedtime as needed for sleep. Do not take whole tablet    Dispense:  15 tablet    Refill:  2     Follow up plan: Return in about 3 months (around 10/12/2017) for Depression PHQ / GAD / Insomnia med adjust.  Nobie Putnam, DO [facility] 07/13/2017, 11:55 PM

## 2017-07-13 NOTE — Assessment & Plan Note (Addendum)
Uncertain if improved MDD in interval, PHQ is incongruent with clinical Seems family stressors primary issue, off SSRI and Trazodone now [place] insomnia Prior Meds: Sertraline 25mg , Escitalopram 20mg , Ambien, Trazodone 50mg  - No prior Psych / counseling, has church support and friends  Plan: 1. Discussion on chronic depression management, complications, likely contributing to insomnia - START SNRI Venlafaxine 37.5mg  capsule (24hr) reviewed options on other possible meds, she has hesitation on taking fluoxetine and other SSRI meds she knows that some people have not done well on these meds. Reviewed benefits, side effects, and goal, may need dose inc in future if effective - Again offered referral for 2nd opinion from psychiatry / psychologist therapy but she is not interested at this time. Advised that if we are unable to get improvement on several med trials, then limited options and next step would be referral - Follow-up 3 months if doing well, call or return sooner within 4-6 weeks if need med adjust

## 2017-07-13 NOTE — Assessment & Plan Note (Signed)
Improved control insomnia on regular Zolpidem 5mg  (half of 10), secondary to likely mood MDD -PHQ9: 11 to 6 not difficult, but clinically incongruent Prior Meds: Sertraline 25mg , Escitalopram 20mg , Ambien, Trazodone 50mg  - No prior Psych / counseling  Plan: 1. Discussion on chronic depression management, complications, likely contributing to insomnia - START SNRI Venlafaxine - see A&P - Refill Zolpidem 10mg  tabs - take HALF for 5 mg nightly PRN, may skip some nights if able. Given 3 month supply - Emphasis on adjusting sleep schedule, she seems to have unreasonable expectations on how long she should be sleeping at night, goes to bed early and wakes up earlier, likely with age >78 and biological clock change can wake up earlier, she should adjust sleep schedule to go to bed later and sleep in later >6-7 hours per night, may be requiring more sleep if depression active - Future consider therapy or mental health Follow-up 3 months

## 2017-07-26 ENCOUNTER — Other Ambulatory Visit: Payer: Self-pay | Admitting: Family Medicine

## 2017-07-26 DIAGNOSIS — Z1231 Encounter for screening mammogram for malignant neoplasm of breast: Secondary | ICD-10-CM

## 2017-08-10 ENCOUNTER — Other Ambulatory Visit: Payer: Self-pay

## 2017-08-10 DIAGNOSIS — F339 Major depressive disorder, recurrent, unspecified: Secondary | ICD-10-CM

## 2017-08-10 MED ORDER — VENLAFAXINE HCL ER 37.5 MG PO CP24
37.5000 mg | ORAL_CAPSULE | Freq: Every day | ORAL | 1 refills | Status: DC
Start: 2017-08-10 — End: 2017-10-26

## 2017-09-05 DIAGNOSIS — Z0100 Encounter for examination of eyes and vision without abnormal findings: Secondary | ICD-10-CM | POA: Diagnosis not present

## 2017-09-08 ENCOUNTER — Ambulatory Visit
Admission: RE | Admit: 2017-09-08 | Discharge: 2017-09-08 | Disposition: A | Discharge status: Home / Self Care | Payer: Medicare HMO | Source: Ambulatory Visit | Attending: Family Medicine | Admitting: Family Medicine

## 2017-09-08 DIAGNOSIS — Z1231 Encounter for screening mammogram for malignant neoplasm of breast: Secondary | ICD-10-CM | POA: Diagnosis not present

## 2017-09-12 ENCOUNTER — Other Ambulatory Visit: Payer: Self-pay | Admitting: Family Medicine

## 2017-09-12 DIAGNOSIS — R928 Other abnormal and inconclusive findings on diagnostic imaging of breast: Secondary | ICD-10-CM

## 2017-09-14 ENCOUNTER — Ambulatory Visit
Admission: RE | Admit: 2017-09-14 | Discharge: 2017-09-14 | Disposition: A | Discharge status: Home / Self Care | Payer: Medicare HMO | Source: Ambulatory Visit | Attending: Family Medicine | Admitting: Family Medicine

## 2017-09-14 DIAGNOSIS — N6489 Other specified disorders of breast: Secondary | ICD-10-CM | POA: Diagnosis not present

## 2017-09-14 DIAGNOSIS — R928 Other abnormal and inconclusive findings on diagnostic imaging of breast: Secondary | ICD-10-CM

## 2017-10-19 ENCOUNTER — Ambulatory Visit: Payer: Medicare HMO | Admitting: Family Medicine

## 2017-10-19 DIAGNOSIS — L708 Other acne: Secondary | ICD-10-CM | POA: Diagnosis not present

## 2017-10-19 DIAGNOSIS — Z85828 Personal history of other malignant neoplasm of skin: Secondary | ICD-10-CM | POA: Diagnosis not present

## 2017-10-19 DIAGNOSIS — L821 Other seborrheic keratosis: Secondary | ICD-10-CM | POA: Diagnosis not present

## 2017-10-19 DIAGNOSIS — Z08 Encounter for follow-up examination after completed treatment for malignant neoplasm: Secondary | ICD-10-CM | POA: Diagnosis not present

## 2017-10-26 ENCOUNTER — Other Ambulatory Visit: Payer: Self-pay | Admitting: Family Medicine

## 2017-10-26 ENCOUNTER — Ambulatory Visit (INDEPENDENT_AMBULATORY_CARE_PROVIDER_SITE_OTHER): Payer: Medicare HMO | Admitting: Family Medicine

## 2017-10-26 ENCOUNTER — Encounter: Payer: Self-pay | Admitting: Family Medicine

## 2017-10-26 VITALS — BP 108/65 | HR 75 | Temp 98.2°F | Resp 16 | Ht 63.5 in | Wt 208.6 lb

## 2017-10-26 DIAGNOSIS — G4701 Insomnia due to medical condition: Secondary | ICD-10-CM

## 2017-10-26 DIAGNOSIS — J432 Centrilobular emphysema: Secondary | ICD-10-CM

## 2017-10-26 DIAGNOSIS — Z634 Disappearance and death of family member: Secondary | ICD-10-CM | POA: Diagnosis not present

## 2017-10-26 DIAGNOSIS — F339 Major depressive disorder, recurrent, unspecified: Secondary | ICD-10-CM | POA: Diagnosis not present

## 2017-10-26 DIAGNOSIS — R69 Illness, unspecified: Secondary | ICD-10-CM | POA: Diagnosis not present

## 2017-10-26 DIAGNOSIS — R7309 Other abnormal glucose: Secondary | ICD-10-CM

## 2017-10-26 DIAGNOSIS — Z0000 Encounter for general adult medical examination without abnormal findings: Secondary | ICD-10-CM

## 2017-10-26 DIAGNOSIS — E669 Obesity, unspecified: Secondary | ICD-10-CM

## 2017-10-26 DIAGNOSIS — D508 Other iron deficiency anemias: Secondary | ICD-10-CM

## 2017-10-26 DIAGNOSIS — E7800 Pure hypercholesterolemia, unspecified: Secondary | ICD-10-CM

## 2017-10-26 MED ORDER — ZOLPIDEM TARTRATE 10 MG PO TABS: 5.0000 mg | ORAL_TABLET | Freq: Every evening | ORAL | 2 refills | Status: DC | PRN

## 2017-10-26 MED ORDER — VENLAFAXINE HCL ER 75 MG PO CP24: 75.0000 mg | ORAL_CAPSULE | Freq: Every day | ORAL | 3 refills | Status: DC

## 2017-10-26 NOTE — Assessment & Plan Note (Signed)
See A&P Depression - Improved control still on Ambien 5mg  (half of 10mg ) nightly - recent worse with acute life stressor - Refill Ambien today - 3 month supply - she is doing cash pay since not covered - Increased Venlafaxine as well to 75mg  - Follow-up 3 months

## 2017-10-26 NOTE — Patient Instructions (Addendum)
Thank you for coming to the office today.  Increased Venlafaxine from 37.5 up to 75mg  daily to help control mood and may help sleep  In future we can reduce dose again if not needed.  Refilled Ambien continue taking half tablet as this is helping.  If interested in therapist or psychiatry let me know.  -------------------------------------------------  [facility] [place], [place] [address] Ph [phone] [email or web address]  [facility] [place], [place] [address] Ph [phone] [email or web address]  DUE for FASTING BLOOD WORK (no food or drink after midnight before the lab appointment, only water or coffee without cream/sugar on the morning of)  SCHEDULE "Lab Only" visit in the morning at the clinic for lab draw in 3 MONTHS   - Make sure Lab Only appointment is at about 1 week before your next appointment, so that results will be available  For Lab Results, once available within 2-3 days of blood draw, you can can log in to MyChart online to view your results and a brief explanation. Also, we can discuss results at next follow-up visit.   Please schedule a Follow-up Appointment to: Return in about 3 months (around 01/26/2018) for Annual Physical (f/u mood PHQ).  If you have any other questions or concerns, please feel free to call the office or send a message through [place]. You may also schedule an earlier appointment if necessary.  Additionally, you may be receiving a survey about your experience at our office within a few days to 1 week by e-mail or mail. We value your feedback.  Nobie Putnam, DO [facility]
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
## 2017-10-28 ENCOUNTER — Telehealth: Payer: Self-pay | Admitting: Family Medicine

## 2017-10-28 DIAGNOSIS — F339 Major depressive disorder, recurrent, unspecified: Secondary | ICD-10-CM

## 2017-10-28 MED ORDER — VENLAFAXINE HCL ER 37.5 MG PO CP24: 37.5000 mg | ORAL_CAPSULE | Freq: Every day | ORAL | 1 refills | Status: DC

## 2017-10-28 NOTE — Addendum Note (Signed)
Addended by: Olin Hauser on: 10/28/2017 04:40 PM   Modules accepted: Orders

## 2017-10-28 NOTE — Telephone Encounter (Signed)
Acknowledged. Sent new rx update to her pharmacy to discontinue 75mg  and restart the 37.5mg   Nobie Putnam, [facility] 10/28/2017, 4:40 PM

## 2017-10-28 NOTE — Telephone Encounter (Addendum)
The pt states she will go back on Venlafaxine 37.5MG , because she thinks the other dosage was just to strong for her. She complained that the medication caused her to be really drowsy and that she slept all day yesterday.

## 2017-10-28 NOTE — Telephone Encounter (Signed)
She was just seen on 10/26/17, we increased her dose of Venlafaxine from 37.5mg  daily up to 75mg  daily.  She has a few options:  1) Stop taking 75mg  and reduce back to her previous rx 37.5mg  daily - and potentially the 75mg  is not the right dose for her.  2) She may continue the 75mg  once daily for a little while longer to see if her body adjusts to it more over 1-2 weeks and the continue this for a period of time.  3) She can try alternating 37.5mg  one day and 75mg  the next day for 1-2 weeks, and then try again to take 75mg  daily.  Let me know if/when she needs a new refill if decides to go back to the 37.5mg  if she needs more of this rx and to cancel the 75mg   Nobie Putnam, DO [facility] 10/28/2017, 12:31 PM

## 2017-10-28 NOTE — Telephone Encounter (Signed)
Pt said the venlafaxine was too strong.  She slept all day yesterday [phone]

## 2017-11-14 DIAGNOSIS — R69 Illness, unspecified: Secondary | ICD-10-CM | POA: Diagnosis not present

## 2017-11-22 DIAGNOSIS — R69 Illness, unspecified: Secondary | ICD-10-CM | POA: Diagnosis not present

## 2017-12-13 ENCOUNTER — Other Ambulatory Visit: Payer: Medicare HMO

## 2017-12-16 ENCOUNTER — Encounter: Payer: Medicare HMO | Admitting: Family Medicine

## 2018-01-25 ENCOUNTER — Other Ambulatory Visit: Payer: Medicare HMO

## 2018-01-25 DIAGNOSIS — Z0000 Encounter for general adult medical examination without abnormal findings: Secondary | ICD-10-CM

## 2018-01-25 DIAGNOSIS — E7800 Pure hypercholesterolemia, unspecified: Secondary | ICD-10-CM

## 2018-01-25 DIAGNOSIS — R69 Illness, unspecified: Secondary | ICD-10-CM | POA: Diagnosis not present

## 2018-01-25 DIAGNOSIS — F339 Major depressive disorder, recurrent, unspecified: Secondary | ICD-10-CM

## 2018-01-25 DIAGNOSIS — D508 Other iron deficiency anemias: Secondary | ICD-10-CM

## 2018-01-25 DIAGNOSIS — R7309 Other abnormal glucose: Secondary | ICD-10-CM

## 2018-01-26 LAB — CBC WITH DIFFERENTIAL/PLATELET
BASOS ABS: 28 {cells}/uL (ref 0–200)
Basophils Relative: 0.5 %
EOS ABS: 187 {cells}/uL (ref 15–500)
Eosinophils Relative: 3.4 %
HEMATOCRIT: 37 % (ref 35.0–45.0)
Hemoglobin: 12.1 g/dL (ref 11.7–15.5)
LYMPHS ABS: 2019 {cells}/uL (ref 850–3900)
MCH: 27.3 pg (ref 27.0–33.0)
MCHC: 32.7 g/dL (ref 32.0–36.0)
MCV: 83.3 fL (ref 80.0–100.0)
MPV: 10.7 fL (ref 7.5–12.5)
Monocytes Relative: 6.4 %
Neutro Abs: 2915 cells/uL (ref 1500–7800)
Neutrophils Relative %: 53 %
PLATELETS: 304 10*3/uL (ref 140–400)
RBC: 4.44 10*6/uL (ref 3.80–5.10)
RDW: 14.2 % (ref 11.0–15.0)
TOTAL LYMPHOCYTE: 36.7 %
WBC: 5.5 10*3/uL (ref 3.8–10.8)
WBCMIX: 352 {cells}/uL (ref 200–950)

## 2018-01-26 LAB — HEMOGLOBIN A1C
HEMOGLOBIN A1C: 5.6 %{Hb} (ref <5.7)
MEAN PLASMA GLUCOSE: 114 (calc)
eAG (mmol/L): 6.3 (calc)

## 2018-01-26 LAB — LIPID PANEL
CHOLESTEROL: 207 mg/dL — AB (ref <200)
HDL: 58 mg/dL (ref >50)
LDL CHOLESTEROL (CALC): 123 mg/dL — AB
Non-HDL Cholesterol (Calc): 149 mg/dL (calc) — ABNORMAL HIGH (ref <130)
TRIGLYCERIDES: 150 mg/dL — AB (ref <150)
Total CHOL/HDL Ratio: 3.6 (calc) (ref <5.0)

## 2018-01-26 LAB — COMPLETE METABOLIC PANEL WITH GFR
AG Ratio: 1.6 (calc) (ref 1.0–2.5)
ALKALINE PHOSPHATASE (APISO): 76 U/L (ref 33–130)
ALT: 15 U/L (ref 6–29)
AST: 19 U/L (ref 10–35)
Albumin: 3.9 g/dL (ref 3.6–5.1)
BILIRUBIN TOTAL: 0.6 mg/dL (ref 0.2–1.2)
BUN: 15 mg/dL (ref 7–25)
CO2: 28 mmol/L (ref 20–32)
CREATININE: 0.87 mg/dL (ref 0.60–0.93)
Calcium: 8.7 mg/dL (ref 8.6–10.4)
Chloride: 103 mmol/L (ref 98–110)
GFR, EST AFRICAN AMERICAN: 77 mL/min/{1.73_m2} (ref >=60)
GFR, Est Non African American: 67 mL/min/{1.73_m2} (ref >=60)
GLOBULIN: 2.4 g/dL (ref 1.9–3.7)
GLUCOSE: 94 mg/dL (ref 65–99)
Potassium: 4.1 mmol/L (ref 3.5–5.3)
SODIUM: 139 mmol/L (ref 135–146)
TOTAL PROTEIN: 6.3 g/dL (ref 6.1–8.1)

## 2018-01-26 LAB — T4, FREE: Free T4: 1 ng/dL (ref 0.8–1.8)

## 2018-01-26 LAB — TSH: TSH: 1.5 mIU/L (ref 0.40–4.50)

## 2018-01-29 ENCOUNTER — Encounter: Payer: Self-pay | Admitting: Family Medicine

## 2018-01-29 DIAGNOSIS — R7309 Other abnormal glucose: Secondary | ICD-10-CM | POA: Insufficient documentation

## 2018-02-01 ENCOUNTER — Encounter: Payer: Self-pay | Admitting: Family Medicine

## 2018-02-01 ENCOUNTER — Ambulatory Visit (INDEPENDENT_AMBULATORY_CARE_PROVIDER_SITE_OTHER): Payer: Medicare HMO | Admitting: Family Medicine

## 2018-02-01 VITALS — BP 138/92 | HR 75 | Temp 98.0°F | Resp 16 | Ht 63.5 in | Wt 212.6 lb

## 2018-02-01 DIAGNOSIS — E7800 Pure hypercholesterolemia, unspecified: Secondary | ICD-10-CM | POA: Diagnosis not present

## 2018-02-01 DIAGNOSIS — Z23 Encounter for immunization: Secondary | ICD-10-CM | POA: Diagnosis not present

## 2018-02-01 DIAGNOSIS — J3089 Other allergic rhinitis: Secondary | ICD-10-CM

## 2018-02-01 DIAGNOSIS — F339 Major depressive disorder, recurrent, unspecified: Secondary | ICD-10-CM

## 2018-02-01 DIAGNOSIS — K219 Gastro-esophageal reflux disease without esophagitis: Secondary | ICD-10-CM

## 2018-02-01 DIAGNOSIS — J432 Centrilobular emphysema: Secondary | ICD-10-CM | POA: Diagnosis not present

## 2018-02-01 DIAGNOSIS — L918 Other hypertrophic disorders of the skin: Secondary | ICD-10-CM | POA: Diagnosis not present

## 2018-02-01 DIAGNOSIS — E669 Obesity, unspecified: Secondary | ICD-10-CM

## 2018-02-01 DIAGNOSIS — D492 Neoplasm of unspecified behavior of bone, soft tissue, and skin: Secondary | ICD-10-CM

## 2018-02-01 DIAGNOSIS — R7309 Other abnormal glucose: Secondary | ICD-10-CM | POA: Diagnosis not present

## 2018-02-01 DIAGNOSIS — R69 Illness, unspecified: Secondary | ICD-10-CM | POA: Diagnosis not present

## 2018-02-01 DIAGNOSIS — Z0000 Encounter for general adult medical examination without abnormal findings: Secondary | ICD-10-CM | POA: Diagnosis not present

## 2018-02-01 MED ORDER — ROSUVASTATIN CALCIUM 10 MG PO TABS: 10.0000 mg | ORAL_TABLET | Freq: Every day | ORAL | 1 refills | Status: DC

## 2018-02-01 MED ORDER — FLUTICASONE PROPIONATE 50 MCG/ACT NA SUSP: 2.0000 | Freq: Every day | NASAL | 3 refills | Status: DC

## 2018-02-01 NOTE — Progress Notes (Signed)
Subjective:    Patient ID: [name], female    DOB: 1945-07-21, 72 y.o.   MRN: [id number]  LYBERTI THRUSH is a 72 y.o. female presenting on 02/01/2018 for Annual Exam (obtw sinus infection onset month as per patient)   HPI  Here for Annual Physical and Lab Review.  Sinusitis Recent sick contact, granddaughter sore throat, describes sinus drainage post nasal Not tried meds   FOLLOW-UP INSOMNIA /Depression, Major, Chronic recurrent / BEREAVEMENT Today doing well with significant improvement in mood and sleep now adjusting more after period of grief and bereavement Off Ambien Continues on Venlafaxine 37.5mg  daily with good results - Past meds failed Trazodone, Lexapro, Zoloft, ultimately discontinued or tapered off these -She has faith based support system and Christian friends to talk to  HYPERLIPIDEMIA: - Reports concerns. Last lipid panel 01/2018 elevated LDL - Not taking Statin previously, interested in prevention  COPD No recent flare up. Former smoker  GERD Off PPI now  Pre-Diabetes Recent A1c up to 5.6 from prior 5.4 Meds: none Currently not on ACEi / ARB Lifestyle: - Diet (improving balanced diet)  - Exercise (limited exercise) Denies hypoglycemia  Spot on Left ear Request 2nd opinion, will return to Dermatology for evaluation and likely excision.  Health Maintenance: To receive final PNA vax, pneumovax-23 today DUe flu vaccine today   Depression screen [facility] 2/9 02/01/2018 10/26/2017 07/13/2017  Decreased Interest 0 2 1  Down, Depressed, Hopeless 0 2 1  PHQ - 2 Score 0 4 2  Altered sleeping 0 0 0  Tired, decreased energy 0 0 1  Change in appetite 0 2 2  Feeling bad or failure about yourself  0 1 1  Trouble concentrating 0 1 0  Moving slowly or fidgety/restless 0 1 0  Suicidal thoughts 0 0 0  PHQ-9 Score 0 9 6  Difficult doing work/chores Not difficult at all Somewhat difficult Not difficult at all     Past Medical History:  Diagnosis Date    . Anxiety   . Arthritis    fingers, left knee  . GERD (gastroesophageal reflux disease)   . Insomnia 2006  . Skin cancer 2007   Basal Cell CA resected from Left middle finger   Past Surgical History:  Procedure Laterality Date  . BREAST CYST ASPIRATION    . COLONOSCOPY WITH PROPOFOL N/A 11/20/2014   Procedure: COLONOSCOPY WITH PROPOFOL;  Surgeon: Lucilla Lame, MD;  Location: [place];  Service: Endoscopy;  Laterality: N/A;  . ESOPHAGOGASTRODUODENOSCOPY (EGD) WITH PROPOFOL N/A 11/20/2014   Procedure: ESOPHAGOGASTRODUODENOSCOPY (EGD) WITH PROPOFOL;  Surgeon: Lucilla Lame, MD;  Location: [place];  Service: Endoscopy;  Laterality: N/A;  . POLYPECTOMY  11/20/2014   Procedure: POLYPECTOMY;  Surgeon: Lucilla Lame, MD;  Location: [place];  Service: Endoscopy;;  . SKIN CANCER EXCISION Left    finger  . TUBAL LIGATION  1970   Social History   Socioeconomic History  . Marital status: Married    Spouse name: Not on file  . Number of children: Not on file  . Years of education: Not on file  . Highest education level: Not on file  Occupational History  . Not on file  Social Needs  . Financial resource strain: Not on file  . Food insecurity:    Worry: Not on file    Inability: Not on file  . Transportation needs:    Medical: Not on file    Non-medical: Not on file  Tobacco Use  .  Smoking status: Former Smoker    Packs/day: 1.00    Years: 30.00    Pack years: 30.00    Types: Cigarettes    Last attempt to quit: 01/19/2014    Years since quitting: 4.0  . Smokeless tobacco: Former Network engineer and Sexual Activity  . Alcohol use: No    Alcohol/week: 0.0 standard drinks  . Drug use: No  . Sexual activity: Not on file  Lifestyle  . Physical activity:    Days per week: Not on file    Minutes per session: Not on file  . Stress: Not on file  Relationships  . Social connections:    Talks on phone: Not on file    Gets together: Not on file     Attends religious service: Not on file    Active member of club or organization: Not on file    Attends meetings of clubs or organizations: Not on file    Relationship status: Not on file  . Intimate partner violence:    Fear of current or ex partner: Not on file    Emotionally abused: Not on file    Physically abused: Not on file    Forced sexual activity: Not on file  Other Topics Concern  . Not on file  Social History Narrative  . Not on file   Family History  Problem Relation Age of Onset  . Brain cancer Mother   . Thyroid disease Mother   . Breast cancer Maternal Aunt 26  . Breast cancer Cousin 35   Current Outpatient Medications on File Prior to Visit  Medication Sig  . albuterol (PROVENTIL HFA;VENTOLIN HFA) 108 (90 Base) MCG/ACT inhaler Inhale 2 puffs into the lungs every 6 (six) hours as needed for wheezing or shortness of breath (cough).  . Calcium Carb-Cholecalciferol (GNP CALCIUM 600 +D3 PO) Take by mouth. Reported on 08/26/2015  . Ferrous Sulfate (IRON) 325 (65 Fe) MG TABS Take 1 tablet by mouth daily.  [name] Kitchen loperamide (IMODIUM) 2 MG capsule Take 2 mg by mouth as needed for diarrhea or loose stools.  . Multiple Vitamin (MULTI-VITAMINS) TABS Take by mouth.  . triamcinolone cream (KENALOG) 0.1 % Apply 1 application topically as needed.  . valACYclovir (VALTREX) 1000 MG tablet   . venlafaxine XR (EFFEXOR-XR) 37.5 MG 24 hr capsule Take 1 capsule (37.5 mg total) by mouth daily with breakfast.   No current facility-administered medications on file prior to visit.     Review of Systems  Constitutional: Negative for activity change, appetite change, chills, diaphoresis, fatigue and fever.  HENT: Negative for congestion and hearing loss.   Eyes: Negative for visual disturbance.  Respiratory: Negative for apnea, cough, choking, chest tightness, shortness of breath and wheezing.   Cardiovascular: Negative for chest pain, palpitations and leg swelling.  Gastrointestinal: Negative  for abdominal pain, constipation, diarrhea, nausea and vomiting.  Endocrine: Negative for cold intolerance.  Genitourinary: Negative for difficulty urinating, dysuria, frequency and hematuria.  Musculoskeletal: Negative for arthralgias and neck pain.  Skin: Negative for rash.  Allergic/Immunologic: Negative for environmental allergies.  Neurological: Negative for dizziness, weakness, light-headedness, numbness and headaches.  Hematological: Negative for adenopathy.  Psychiatric/Behavioral: Negative for behavioral problems, dysphoric mood and sleep disturbance.   Per HPI unless specifically indicated above     Objective:    BP (!) 138/92   Pulse 75   Temp 98 F (36.7 C) (Oral)   Resp 16   Ht 5' 3.5" (1.613 m)   Wt 212 lb  9.6 oz (96.4 kg)   BMI 37.07 kg/m   Wt Readings from Last 3 Encounters:  02/01/18 212 lb 9.6 oz (96.4 kg)  10/26/17 208 lb 9.6 oz (94.6 kg)  07/13/17 210 lb (95.3 kg)    Physical Exam  Constitutional: She is oriented to person, place, and time. She appears well-developed and well-nourished. No distress.  Well-appearing, comfortable, cooperative, obese  HENT:  Head: Normocephalic and atraumatic.  Mouth/Throat: Oropharynx is clear and moist.  Eyes: Pupils are equal, round, and reactive to light. Conjunctivae and EOM are normal. Right eye exhibits no discharge. Left eye exhibits no discharge.  Neck: Normal range of motion. Neck supple. No thyromegaly present.  Cardiovascular: Normal rate, regular rhythm, normal heart sounds and intact distal pulses.  No murmur heard. Pulmonary/Chest: Effort normal and breath sounds normal. No respiratory distress. She has no wheezes. She has no rales.  Abdominal: Soft. Bowel sounds are normal. She exhibits no distension and no mass. There is no tenderness.  Musculoskeletal: Normal range of motion. She exhibits no edema or tenderness.  Upper / Lower Extremities: - Normal muscle tone, strength bilateral upper extremities 5/5,  lower extremities 5/5  Lymphadenopathy:    She has no cervical adenopathy.  Neurological: She is alert and oriented to person, place, and time.  Distal sensation intact to light touch all extremities  Skin: Skin is warm and dry. No rash noted. She is not diaphoretic. No erythema.  Small 1 cm or less growth abnormal on top of L helix, skin tags around neck  Psychiatric: She has a normal mood and affect. Her behavior is normal.  Well groomed, good eye contact, normal speech and thoughts  Nursing note and vitals reviewed.  Results for orders placed or performed in visit on 01/25/18  T4, free  Result Value Ref Range   Free T4 1.0 0.8 - 1.8 ng/dL  TSH  Result Value Ref Range   TSH 1.50 0.40 - 4.50 mIU/L  Lipid panel  Result Value Ref Range   Cholesterol 207 (H) <200 mg/dL   HDL 58 >50 mg/dL   Triglycerides 150 (H) <150 mg/dL   LDL Cholesterol (Calc) 123 (H) mg/dL (calc)   Total CHOL/HDL Ratio 3.6 <5.0 (calc)   Non-HDL Cholesterol (Calc) 149 (H) <130 mg/dL (calc)  COMPLETE METABOLIC PANEL WITH GFR  Result Value Ref Range   Glucose, Bld 94 65 - 99 mg/dL   BUN 15 7 - 25 mg/dL   Creat 0.87 0.60 - 0.93 mg/dL   GFR, Est Non African American 67 > OR = 60 mL/min/1.16m2   GFR, Est African American 77 > OR = 60 mL/min/1.83m2   BUN/Creatinine Ratio NOT APPLICABLE 6 - 22 (calc)   Sodium 139 135 - 146 mmol/L   Potassium 4.1 3.5 - 5.3 mmol/L   Chloride 103 98 - 110 mmol/L   CO2 28 20 - 32 mmol/L   Calcium 8.7 8.6 - 10.4 mg/dL   Total Protein 6.3 6.1 - 8.1 g/dL   Albumin 3.9 3.6 - 5.1 g/dL   Globulin 2.4 1.9 - 3.7 g/dL (calc)   AG Ratio 1.6 1.0 - 2.5 (calc)   Total Bilirubin 0.6 0.2 - 1.2 mg/dL   Alkaline phosphatase (APISO) 76 33 - 130 U/L   AST 19 10 - 35 U/L   ALT 15 6 - 29 U/L  CBC with Differential/Platelet  Result Value Ref Range   WBC 5.5 3.8 - 10.8 Thousand/uL   RBC 4.44 3.80 - 5.10 Million/uL   Hemoglobin 12.1  11.7 - 15.5 g/dL   HCT 37.0 35.0 - 45.0 %   MCV 83.3 80.0 - 100.0  fL   MCH 27.3 27.0 - 33.0 pg   MCHC 32.7 32.0 - 36.0 g/dL   RDW 14.2 11.0 - 15.0 %   Platelets 304 140 - 400 Thousand/uL   MPV 10.7 7.5 - 12.5 fL   Neutro Abs 2,915 1,500 - 7,800 cells/uL   Lymphs Abs 2,019 850 - 3,900 cells/uL   WBC mixed population 352 200 - 950 cells/uL   Eosinophils Absolute 187 15 - 500 cells/uL   Basophils Absolute 28 0 - 200 cells/uL   Neutrophils Relative % 53 %   Total Lymphocyte 36.7 %   Monocytes Relative 6.4 %   Eosinophils Relative 3.4 %   Basophils Relative 0.5 %  Hemoglobin A1c  Result Value Ref Range   Hgb A1c MFr Bld 5.6 <5.7 % of total Hgb   Mean Plasma Glucose 114 (calc)   eAG (mmol/L) 6.3 (calc)      Assessment & Plan:   Problem List Items Addressed This Visit    COPD (chronic obstructive pulmonary disease) ([place]) Stable without recent exacerbation or complication    Relevant Medications   fluticasone (FLONASE) 50 MCG/ACT nasal spray   Elevated hemoglobin A1c Relatively stable, 5.6, still higher, not in PreDM range Improve lifestyle diet exercise, goal wt loss    GERD (gastroesophageal reflux disease) OFF PPI    Hyperlipidemia Mild elevated LDL but overall elevated ASCVD risk >10% Start new statin Rosuvastatin 10mg  nightly - titration instructions per AVS    Relevant Medications   rosuvastatin (CRESTOR) 10 MG tablet   Major depression, recurrent, chronic (HCC) Controlled currently with improvement On SNRI continue venlafaxine Off Ambien    Obesity (BMI 35.0-39.9 without comorbidity) Encourage diet / exercise lifestyle management - Given limited ability to lose weight Recommended [facility] evaluation    Other Visit Diagnoses    Annual physical exam    -  Primary Updated Health Maintenance information Reviewed recent lab results with patient Encouraged improvement to lifestyle with diet and exercise - Goal of weight loss     Need for 23-polyvalent pneumococcal polysaccharide vaccine       Relevant Orders     Pneumococcal polysaccharide vaccine 23-valent greater than or equal to 2yo subcutaneous/IM (Completed)   Needs flu shot       Relevant Orders   Flu vaccine HIGH DOSE PF (Completed)   Cutaneous skin tags       Neoplasm of skin of ear   Return to her dermatology for 2nd opinion, likely excision of growth on ear, possibly skin tags      Seasonal allergic rhinitis due to other allergic trigger       Relevant Medications   fluticasone (FLONASE) 50 MCG/ACT nasal spray      Meds ordered this encounter  Medications  . rosuvastatin (CRESTOR) 10 MG tablet    Sig: Take 1 tablet (10 mg total) by mouth at bedtime.    Dispense:  90 tablet    Refill:  1  . fluticasone (FLONASE) 50 MCG/ACT nasal spray    Sig: Place 2 sprays into both nostrils daily. Use for 4-6 weeks then stop and use seasonally or as needed.    Dispense:  16 g    Refill:  3      Follow up plan: Return in about 6 months (around 08/03/2018) for 6 month f/u Lab results - Elevated Sugar, HLD (on statin),  Weight.   Future labs ordered for 07/28/18  Lab 6 months - CMET, A1c, Lipid - statin new start  Nobie Putnam, [facility] 02/01/2018, 2:44 PM

## 2018-02-01 NOTE — Patient Instructions (Addendum)
Thank you for coming to the office today.  Flu shot today and final pneumonia vaccine today  Call Dr Youlanda [facility] schedule to remove skin growth on ear, and look at skin tags.  A1c 5.6, this means CLOSE to a pre-diabetic, still far away from diabets  Try to reduce carb and starches - See list on handout  Try to resume regular walking exercise  Contact them if interested to learn more about weight management program  Dr Dennard Nip  [facility] [place], [place] [address] Ph: [phone]  You are at increased risk of future Cardiovascular complications such as Heart Attack or Stroke from an artery blockage due to abnormal cholesterol and/or risk factors. - As discussed, Statin Cholesterol pills both can both LOWER cholesterol and REDUCE this future risk of heart attack and stroke - Start Rosuvastatin (generic Crestor) 10mg  pill once at bedtime every night  If you develop mild aches or pains in muscle or joint that does NOT improve or go away after first 3-4 weeks then this may require [place] to adjust the dose. First I would recommend STOPPING the medication for a few weeks until your ache and pain symptoms completely RESOLVE. Then you can restart at a LOWER DOSE either HALF a pill at bedtime every night or LESS OFTEN such as one pill a week only and then gradually increase to every other day or max dose of 3 times a week  Lastly, sometimes we need to try other versions of this medicine to find one that works for you and does not cause side effects.   DUE for FASTING BLOOD WORK (no food or drink after midnight before the lab appointment, only water or coffee without cream/sugar on the morning of)  SCHEDULE "Lab Only" visit in the morning at the clinic for lab draw in 6 MONTHS   - Make sure Lab Only appointment is at about 1 week before your next appointment, so that results will be available  For Lab Results, once available within  2-3 days of blood draw, you can can log in to MyChart online to view your results and a brief explanation. Also, we can discuss results at next follow-up visit.   Please schedule a Follow-up Appointment to: Return in about 6 months (around 08/03/2018) for 6 month f/u Lab results - Elevated Sugar, HLD (on statin), Weight.  If you have any other questions or concerns, please feel free to call the office or send a message through [place]. You may also schedule an earlier appointment if necessary.  Additionally, you may be receiving a survey about your experience at our office within a few days to 1 week by e-mail or mail. We value your feedback.  Nobie Putnam, DO [place]

## 2018-02-02 ENCOUNTER — Other Ambulatory Visit: Payer: Self-pay | Admitting: Family Medicine

## 2018-02-02 DIAGNOSIS — R7309 Other abnormal glucose: Secondary | ICD-10-CM

## 2018-02-02 DIAGNOSIS — Z5181 Encounter for therapeutic drug level monitoring: Secondary | ICD-10-CM

## 2018-02-02 DIAGNOSIS — E7800 Pure hypercholesterolemia, unspecified: Secondary | ICD-10-CM

## 2018-02-02 DIAGNOSIS — Z79899 Other long term (current) drug therapy: Secondary | ICD-10-CM

## 2018-03-31 ENCOUNTER — Encounter: Payer: Self-pay | Admitting: Nurse Practitioner

## 2018-03-31 ENCOUNTER — Other Ambulatory Visit: Payer: Self-pay

## 2018-03-31 ENCOUNTER — Ambulatory Visit (INDEPENDENT_AMBULATORY_CARE_PROVIDER_SITE_OTHER): Payer: Medicare HMO | Admitting: Nurse Practitioner

## 2018-03-31 VITALS — BP 118/74 | HR 67 | Temp 98.1°F | Resp 17 | Ht 63.5 in | Wt 212.6 lb

## 2018-03-31 DIAGNOSIS — H1033 Unspecified acute conjunctivitis, bilateral: Secondary | ICD-10-CM

## 2018-03-31 MED ORDER — ERYTHROMYCIN 5 MG/GM OP OINT: 1.0000 "application " | TOPICAL_OINTMENT | Freq: Three times a day (TID) | OPHTHALMIC | 0 refills | Status: DC

## 2018-03-31 NOTE — Patient Instructions (Addendum)
Kelly Weber,   Thank you for coming in to clinic today.  1. You have pink eye.  This could be viral, but we will treat you for bacterial infection just in case. - Place erythromycin ointment along the lower eyelid three times daily for 4 days.  2. Keep hands washed. Avoid touching your eyes.  Please schedule a follow-up appointment with Cassell Smiles, AGNP. Return 5-7 days if symptoms worsen or fail to improve.  If you have any other questions or concerns, please feel free to call the clinic or send a message through [place]. You may also schedule an earlier appointment if necessary.  You will receive a survey after today's visit either digitally by e-mail or paper by [employer]. Your experiences and feedback matter to [place].  Please respond so we know how we are doing as we provide care for you.   Cassell Smiles, DNP, AGNP-BC Adult Gerontology Nurse Practitioner [place]

## 2018-03-31 NOTE — Progress Notes (Signed)
Subjective:    Patient ID: Kelly Weber, female    DOB: May 25, 1945, 72 y.o.   MRN: [id number]  Kelly Weber is a 72 y.o. female presenting on 03/31/2018 for Conjunctivitis (bilateral eye drainage, itchy, redness, and mucus.x 1 day The pt states she washed her eyes with soap & water and put vaseline on her eyelids on yesterday, but no improvement. )   HPI Pink Eye Patient presents with symptoms that started yesterday and have persisted today.  Symptoms include bilateral eye drainage, itchiness, redness, and increased mucous production.  She has used home therapies that are likely to worsen symptoms as noted above in CC.   - No recent colds, sickness, other sick contacts.  - Denies fever, chills, or sweats. - Does not know when she last changed her eye makeup, but is not wearing any today.  Social History   Tobacco Use  . Smoking status: Former Smoker    Packs/day: 1.00    Years: 30.00    Pack years: 30.00    Types: Cigarettes    Last attempt to quit: 01/19/2014    Years since quitting: 4.1  . Smokeless tobacco: Former Network engineer Use Topics  . Alcohol use: No    Alcohol/week: 0.0 standard drinks  . Drug use: No    Review of Systems Per HPI unless specifically indicated above     Objective:    BP 118/74 (BP Location: Right Arm, Patient Position: Sitting, Cuff Size: Large)   Pulse 67   Temp 98.1 F (36.7 C) (Oral)   Resp 17   Ht 5' 3.5" (1.613 m)   Wt 212 lb 9.6 oz (96.4 kg)   SpO2 98%   BMI 37.07 kg/m   Wt Readings from Last 3 Encounters:  03/31/18 212 lb 9.6 oz (96.4 kg)  02/01/18 212 lb 9.6 oz (96.4 kg)  10/26/17 208 lb 9.6 oz (94.6 kg)    Physical Exam Vitals signs reviewed.  Constitutional:      General: She is not in acute distress.    Appearance: She is well-developed.  HENT:     Head: Normocephalic and atraumatic.     Right Ear: Tympanic membrane, ear canal and external ear normal. Decreased hearing noted.     Left Ear: Tympanic membrane, ear  canal and external ear normal. Decreased hearing noted.     Nose: Nose normal.     Mouth/Throat:     Lips: Pink.     Mouth: Mucous membranes are moist.     Pharynx: Oropharynx is clear. Uvula midline. No pharyngeal swelling or posterior oropharyngeal erythema.     Tonsils: Swelling: 0 on the right. 0 on the left.  Eyes:     General: Lids are everted, no foreign bodies appreciated. Gaze aligned appropriately.        Right eye: Discharge present. No foreign body or hordeolum.        Left eye: Discharge present.No foreign body or hordeolum.     Extraocular Movements: Extraocular movements intact.     Conjunctiva/sclera:     Right eye: Right conjunctiva is injected.     Left eye: Left conjunctiva is injected.  Cardiovascular:     Rate and Rhythm: Normal rate and regular rhythm.     Pulses:          Radial pulses are 2+ on the right side and 2+ on the left side.       Posterior tibial pulses are 1+ on the right side and  1+ on the left side.     Heart sounds: Normal heart sounds, S1 normal and S2 normal.  Pulmonary:     Effort: Pulmonary effort is normal. No respiratory distress.     Breath sounds: Normal breath sounds and air entry.  Musculoskeletal:     Right lower leg: No edema.     Left lower leg: No edema.  Skin:    General: Skin is warm and dry.     Capillary Refill: Capillary refill takes less than 2 seconds.  Neurological:     Mental Status: She is alert and oriented to person, place, and time.  Psychiatric:        Attention and Perception: Attention normal.        Mood and Affect: Mood and affect normal.        Behavior: Behavior normal. Behavior is cooperative.    Results for orders placed or performed in visit on 01/25/18  T4, free  Result Value Ref Range   Free T4 1.0 0.8 - 1.8 ng/dL  TSH  Result Value Ref Range   TSH 1.50 0.40 - 4.50 mIU/L  Lipid panel  Result Value Ref Range   Cholesterol 207 (H) <200 mg/dL   HDL 58 >50 mg/dL   Triglycerides 150 (H) <150 mg/dL    LDL Cholesterol (Calc) 123 (H) mg/dL (calc)   Total CHOL/HDL Ratio 3.6 <5.0 (calc)   Non-HDL Cholesterol (Calc) 149 (H) <130 mg/dL (calc)  COMPLETE METABOLIC PANEL WITH GFR  Result Value Ref Range   Glucose, Bld 94 65 - 99 mg/dL   BUN 15 7 - 25 mg/dL   Creat 0.87 0.60 - 0.93 mg/dL   GFR, Est Non African American 67 > OR = 60 mL/min/1.19m2   GFR, Est African American 77 > OR = 60 mL/min/1.52m2   BUN/Creatinine Ratio NOT APPLICABLE 6 - 22 (calc)   Sodium 139 135 - 146 mmol/L   Potassium 4.1 3.5 - 5.3 mmol/L   Chloride 103 98 - 110 mmol/L   CO2 28 20 - 32 mmol/L   Calcium 8.7 8.6 - 10.4 mg/dL   Total Protein 6.3 6.1 - 8.1 g/dL   Albumin 3.9 3.6 - 5.1 g/dL   Globulin 2.4 1.9 - 3.7 g/dL (calc)   AG Ratio 1.6 1.0 - 2.5 (calc)   Total Bilirubin 0.6 0.2 - 1.2 mg/dL   Alkaline phosphatase (APISO) 76 33 - 130 U/L   AST 19 10 - 35 U/L   ALT 15 6 - 29 U/L  CBC with Differential/Platelet  Result Value Ref Range   WBC 5.5 3.8 - 10.8 Thousand/uL   RBC 4.44 3.80 - 5.10 Million/uL   Hemoglobin 12.1 11.7 - 15.5 g/dL   HCT 37.0 35.0 - 45.0 %   MCV 83.3 80.0 - 100.0 fL   MCH 27.3 27.0 - 33.0 pg   MCHC 32.7 32.0 - 36.0 g/dL   RDW 14.2 11.0 - 15.0 %   Platelets 304 140 - 400 Thousand/uL   MPV 10.7 7.5 - 12.5 fL   Neutro Abs 2,915 1,500 - 7,800 cells/uL   Lymphs Abs 2,019 850 - 3,900 cells/uL   WBC mixed population 352 200 - 950 cells/uL   Eosinophils Absolute 187 15 - 500 cells/uL   Basophils Absolute 28 0 - 200 cells/uL   Neutrophils Relative % 53 %   Total Lymphocyte 36.7 %   Monocytes Relative 6.4 %   Eosinophils Relative 3.4 %   Basophils Relative 0.5 %  Hemoglobin A1c  Result Value Ref Range   Hgb A1c MFr Bld 5.6 <5.7 % of total Hgb   Mean Plasma Glucose 114 (calc)   eAG (mmol/L) 6.3 (calc)      Assessment & Plan:   Problem List Items Addressed This Visit    None    Visit Diagnoses    Acute bacterial conjunctivitis of both eyes    -  Primary   Relevant Medications    erythromycin ophthalmic ointment    Acute bilateral conjunctivitis for past 2 days, with mild scleral/conjunctival injection with discharge currently with clear discharge actively. Suspected viral vs bacterial etiology with no sick contact and no associated URI symptoms. - Normal visual acuity in office - No evidence of complication, foreign body, or extending eyelid / pre-septal cellulitis - inadequate conservative treatment at home  Plan: 1. Reassurance, most likely self limited, but will provide empiric coverage with Start erythromycin antibiotic eye ointment 1 application in both eyes every 8 hours for 4 days.  Can extend if needed to 14 days 2. Start moist warm compresses over both eyelids 10-15 min at a time, up to 4-6 times daily until resolution 3. Frequent hand washing, avoid rubbing / scratching eye 4. Strict return precautions for spreading infection 5. Follow-up 1-2 weeks as needed   Meds ordered this encounter  Medications  . erythromycin ophthalmic ointment    Sig: Place 1 application into both eyes 3 (three) times daily.    Dispense:  3.5 g    Refill:  0    Order Specific Question:   Supervising Provider    Answer:   Olin Hauser [[id number]]   Follow up plan: Return 5-7 days if symptoms worsen or fail to improve.  Cassell Smiles, DNP, AGPCNP-BC Adult Gerontology Primary Care Nurse Practitioner [facility] 03/31/2018, 4:10 PM

## 2018-05-10 DIAGNOSIS — L728 Other follicular cysts of the skin and subcutaneous tissue: Secondary | ICD-10-CM | POA: Diagnosis not present

## 2018-05-10 DIAGNOSIS — B078 Other viral warts: Secondary | ICD-10-CM | POA: Diagnosis not present

## 2018-05-10 DIAGNOSIS — L538 Other specified erythematous conditions: Secondary | ICD-10-CM | POA: Diagnosis not present

## 2018-05-16 ENCOUNTER — Encounter (INDEPENDENT_AMBULATORY_CARE_PROVIDER_SITE_OTHER): Payer: Self-pay

## 2018-05-23 DIAGNOSIS — R69 Illness, unspecified: Secondary | ICD-10-CM | POA: Diagnosis not present

## 2018-05-29 ENCOUNTER — Encounter (INDEPENDENT_AMBULATORY_CARE_PROVIDER_SITE_OTHER): Payer: Self-pay | Admitting: Family Medicine

## 2018-05-29 ENCOUNTER — Ambulatory Visit (INDEPENDENT_AMBULATORY_CARE_PROVIDER_SITE_OTHER): Payer: Medicare HMO | Admitting: Family Medicine

## 2018-05-29 VITALS — BP 127/78 | HR 70 | Temp 97.9°F | Ht 64.0 in | Wt 214.0 lb

## 2018-05-29 DIAGNOSIS — E559 Vitamin D deficiency, unspecified: Secondary | ICD-10-CM | POA: Diagnosis not present

## 2018-05-29 DIAGNOSIS — R5383 Other fatigue: Secondary | ICD-10-CM

## 2018-05-29 DIAGNOSIS — E7849 Other hyperlipidemia: Secondary | ICD-10-CM | POA: Diagnosis not present

## 2018-05-29 DIAGNOSIS — R0602 Shortness of breath: Secondary | ICD-10-CM

## 2018-05-29 DIAGNOSIS — Z6836 Body mass index (BMI) 36.0-36.9, adult: Secondary | ICD-10-CM | POA: Diagnosis not present

## 2018-05-29 DIAGNOSIS — Z0289 Encounter for other administrative examinations: Secondary | ICD-10-CM

## 2018-05-29 DIAGNOSIS — E66812 Obesity, class 2: Secondary | ICD-10-CM

## 2018-05-29 DIAGNOSIS — Z1331 Encounter for screening for depression: Secondary | ICD-10-CM | POA: Diagnosis not present

## 2018-05-30 LAB — COMPREHENSIVE METABOLIC PANEL
ALT: 26 IU/L (ref 0–32)
AST: 26 IU/L (ref 0–40)
Albumin/Globulin Ratio: 1.7 (ref 1.2–2.2)
Albumin: 4.1 g/dL (ref 3.7–4.7)
Alkaline Phosphatase: 85 IU/L (ref 39–117)
BUN/Creatinine Ratio: 14 (ref 12–28)
BUN: 12 mg/dL (ref 8–27)
Bilirubin Total: 0.5 mg/dL (ref 0.0–1.2)
CO2: 24 mmol/L (ref 20–29)
Calcium: 8.9 mg/dL (ref 8.7–10.3)
Chloride: 102 mmol/L (ref 96–106)
Creatinine, Ser: 0.86 mg/dL (ref 0.57–1.00)
GFR calc Af Amer: 78 mL/min/{1.73_m2} (ref >59)
GFR calc non Af Amer: 67 mL/min/{1.73_m2} (ref >59)
GLUCOSE: 87 mg/dL (ref 65–99)
Globulin, Total: 2.4 g/dL (ref 1.5–4.5)
Potassium: 4.3 mmol/L (ref 3.5–5.2)
Sodium: 141 mmol/L (ref 134–144)
Total Protein: 6.5 g/dL (ref 6.0–8.5)

## 2018-05-30 LAB — CBC WITH DIFFERENTIAL
Basophils Absolute: 0 10*3/uL (ref 0.0–0.2)
Basos: 1 %
EOS (ABSOLUTE): 0.1 10*3/uL (ref 0.0–0.4)
Eos: 1 %
Hematocrit: 37.4 % (ref 34.0–46.6)
Hemoglobin: 11.9 g/dL (ref 11.1–15.9)
IMMATURE GRANS (ABS): 0 10*3/uL (ref 0.0–0.1)
Immature Granulocytes: 0 %
LYMPHS: 37 %
Lymphocytes Absolute: 2.4 10*3/uL (ref 0.7–3.1)
MCH: 26.9 pg (ref 26.6–33.0)
MCHC: 31.8 g/dL (ref 31.5–35.7)
MCV: 85 fL (ref 79–97)
Monocytes Absolute: 0.4 10*3/uL (ref 0.1–0.9)
Monocytes: 6 %
Neutrophils Absolute: 3.6 10*3/uL (ref 1.4–7.0)
Neutrophils: 55 %
RBC: 4.42 x10E6/uL (ref 3.77–5.28)
RDW: 14.5 % (ref 11.7–15.4)
WBC: 6.6 10*3/uL (ref 3.4–10.8)

## 2018-05-30 LAB — T4, FREE: Free T4: 0.89 ng/dL (ref 0.82–1.77)

## 2018-05-30 LAB — HEMOGLOBIN A1C
Est. average glucose Bld gHb Est-mCnc: 114 mg/dL
HEMOGLOBIN A1C: 5.6 % (ref 4.8–5.6)

## 2018-05-30 LAB — LIPID PANEL WITH LDL/HDL RATIO
CHOLESTEROL TOTAL: 133 mg/dL (ref 100–199)
HDL: 60 mg/dL (ref >39)
LDL Calculated: 53 mg/dL (ref 0–99)
LDl/HDL Ratio: 0.9 ratio (ref 0.0–3.2)
Triglycerides: 101 mg/dL (ref 0–149)
VLDL Cholesterol Cal: 20 mg/dL (ref 5–40)

## 2018-05-30 LAB — T3: T3, Total: 93 ng/dL (ref 71–180)

## 2018-05-30 LAB — VITAMIN D 25 HYDROXY (VIT D DEFICIENCY, FRACTURES): VIT D 25 HYDROXY: 34.8 ng/mL (ref 30.0–100.0)

## 2018-05-30 LAB — INSULIN, RANDOM: INSULIN: 5.9 u[IU]/mL (ref 2.6–24.9)

## 2018-05-30 LAB — TSH: TSH: 2.23 u[IU]/mL (ref 0.450–4.500)

## 2018-05-30 NOTE — Progress Notes (Signed)
.  Office: [phone]  /  Fax: [phone]   HPI:   Chief Complaint: Kelly Weber (MR# [id number]) is a 73 y.o. female who presents on 05/29/2018 for obesity evaluation and treatment. Current BMI is Body mass index is 36.73 kg/m.  Kelly Weber has struggled with obesity for years and has been unsuccessful in either losing weight or maintaining long term weight loss. [name] attended our information session and states she is currently in the action stage of change and ready to dedicate time achieving and maintaining a healthier weight.   [name] states her family eats meals together she thinks her family will eat healthier with her her desired weight loss is 64 lbs she started gaining weight when she retired 7 years ago her heaviest weight ever was 214 lbs. she skips meals frequently she is trying to eat vegetarian she is frequently drinking liquids with calories   Fatigue Kelly Weber feels her energy is lower than it should be. This has worsened with weight gain and has not worsened recently. Kelly Weber denies daytime somnolence and denies waking up still tired. Patient is at risk for obstructive sleep apnea. Patent has a history of symptoms of morning headache. Patient generally gets 6 hours of sleep per night, and states they generally have generally restful sleep. Snoring is present. Apneic episodes are not present. Epworth Sleepiness Score is 8.  Dyspnea on exertion [facility] notes increasing shortness of breath with exercising and seems to be worsening over time with weight gain. She notes getting out of breath sooner with activity than she used to. This has not gotten worse recently. [name] denies orthopnea.  Hyperlipidemia Kelly Weber has hyperlipidemia and has been trying to improve her cholesterol levels with intensive lifestyle modification including a low saturated fat diet, exercise and weight loss. She is on Crestor 10mg  and she denies any chest pain or myalgias. Kelly Weber is due for labs today.  Vitamin D  deficiency Kelly Weber has a diagnosis of vitamin D deficiency. She is currently taking a multivitamin and admits fatigue. She does not have recent labs.  Depression Screen Kelly Weber's Food and Mood (modified PHQ-9) score was 7. Depression screen PHQ 2/9 05/29/2018  Decreased Interest 1  Down, Depressed, Hopeless 1  PHQ - 2 Score 2  Altered sleeping 0  Tired, decreased energy 3  Change in appetite 1  Feeling bad or failure about yourself  1  Trouble concentrating 0  Moving slowly or fidgety/restless 0  Suicidal thoughts 0  PHQ-9 Score 7  Difficult doing work/chores Not difficult at all  Some recent data might be hidden   ASSESSMENT AND PLAN:  Other fatigue - Plan: EKG 12-Lead, Comprehensive metabolic panel, CBC With Differential, Hemoglobin A1c, Insulin, random, T3, T4, free, TSH  Shortness of breath on exertion  Other hyperlipidemia - Plan: Lipid Panel With LDL/HDL Ratio  Vitamin D deficiency - Plan: VITAMIN D 25 Hydroxy (Vit-D Deficiency, Fractures)  Depression screening  Class 2 severe obesity with serious comorbidity and body mass index (BMI) of 36.0 to 36.9 in adult, unspecified obesity type (HCC)  PLAN:  Fatigue Kelly Weber was informed that her fatigue may be related to obesity, depression or many other causes. Labs will be ordered, and in the meanwhile [name] has agreed to work on diet, exercise and weight loss to help with fatigue. Proper sleep hygiene was discussed including the need for 7-8 hours of quality sleep each night. A sleep study was not ordered based on symptoms and Epworth score. An EKG and an indirect calorimetry  was ordered and [name] agrees to follow up in 2 weeks.  Dyspnea on exertion Kelly Weber's shortness of breath appears to be obesity related and exercise induced. She has agreed to work on weight loss and gradually increase exercise to treat her exercise induced shortness of breath. If [name] follows our instructions and loses weight without improvement of her shortness of  breath, we will plan to refer to pulmonology. We ordered an indirect calorimetry, EKG, and labs today. We will monitor this condition regularly. Kelly Weber agrees to this plan and will follow up as directed.  Hyperlipidemia Kelly Weber was informed of the American Heart Association Guidelines emphasizing intensive lifestyle modifications as the first line treatment for hyperlipidemia. We discussed many lifestyle modifications today in depth, and Kelly Weber will continue to work on decreasing saturated fats such as fatty red meat, butter and many fried foods. She will also increase vegetables and lean protein in her diet and continue to work on exercise and weight loss efforts. Labs were ordered and Kelly Weber agrees to start her diet plan. She will follow up as directed.  Vitamin D Deficiency Kelly Weber was informed that low vitamin D levels contributes to fatigue and are associated with obesity, breast, and colon cancer. We will order a vitamin D level today and she agrees to follow up in 2 weeks.  Depression Screen Kelly Weber had a mildly positive depression screening. Depression is commonly associated with obesity and often results in emotional eating behaviors. We will monitor this closely and work on CBT to help improve the non-hunger eating patterns. Referral to Psychology may be required if no improvement is seen as she continues in our clinic.  Obesity [name] is currently in the action stage of change and her goal is to continue with weight loss efforts. She has agreed to follow the Category 2 plan. Kelly Weber has been instructed to work up to a goal of 150 minutes of combined cardio and strengthening exercise per week for weight loss and overall health benefits. We discussed the following Behavioral Modification Strategies today: increasing lean protein intake, decreasing simple carbohydrates, and work on meal planning and easy cooking plans.  Kelly Weber has agreed to follow up with our clinic in 2 weeks. She was informed of the importance  of frequent follow up visits to maximize her success with intensive lifestyle modifications for her multiple health conditions. She was informed we would discuss her lab results at her next visit unless there is a critical issue that needs to be addressed sooner. Vernelle agreed to keep her next visit at the agreed upon time to discuss these results.  ALLERGIES: Allergies  Allergen Reactions  . Penicillins Other (See Comments)    MEDICATIONS: Current Outpatient Medications on File Prior to Visit  Medication Sig Dispense Refill  . Calcium Carb-Cholecalciferol (GNP CALCIUM 600 +D3 PO) Take by mouth. Reported on 08/26/2015    . fluticasone (FLONASE) 50 MCG/ACT nasal spray Place 2 sprays into both nostrils daily. Use for 4-6 weeks then stop and use seasonally or as needed. 16 g 3  . loperamide (IMODIUM) 2 MG capsule Take 2 mg by mouth as needed for diarrhea or loose stools.    . Multiple Vitamin (MULTI-VITAMINS) TABS Take by mouth.    . rosuvastatin (CRESTOR) 10 MG tablet Take 1 tablet (10 mg total) by mouth at bedtime. 90 tablet 1  . Skin Protectants, Misc. (EUCERIN) cream Apply topically as needed for dry skin.    . valACYclovir (VALTREX) 1000 MG tablet     . venlafaxine XR (  EFFEXOR-XR) 37.5 MG 24 hr capsule Take 1 capsule (37.5 mg total) by mouth daily with breakfast. 90 capsule 1   No current facility-administered medications on file prior to visit.     PAST MEDICAL HISTORY: Past Medical History:  Diagnosis Date  . Allergies   . Anxiety   . Arthritis    fingers, left knee  . COPD (chronic obstructive pulmonary disease) ([place])   . Depression   . Diverticulitis   . Dyspnea   . Fatigue   . GERD (gastroesophageal reflux disease)   . HLD (hyperlipidemia)   . Insomnia 2006  . Lower back pain   . Skin cancer 2007   Basal Cell CA resected from Left middle finger    PAST SURGICAL HISTORY: Past Surgical History:  Procedure Laterality Date  . BREAST CYST ASPIRATION    . COLONOSCOPY  WITH PROPOFOL N/A 11/20/2014   Procedure: COLONOSCOPY WITH PROPOFOL;  Surgeon: Lucilla Lame, MD;  Location: [place];  Service: Endoscopy;  Laterality: N/A;  . ESOPHAGOGASTRODUODENOSCOPY (EGD) WITH PROPOFOL N/A 11/20/2014   Procedure: ESOPHAGOGASTRODUODENOSCOPY (EGD) WITH PROPOFOL;  Surgeon: Lucilla Lame, MD;  Location: [place];  Service: Endoscopy;  Laterality: N/A;  . POLYPECTOMY  11/20/2014   Procedure: POLYPECTOMY;  Surgeon: Lucilla Lame, MD;  Location: [place];  Service: Endoscopy;;  . SKIN CANCER EXCISION Left    finger  . TUBAL LIGATION  1970    SOCIAL HISTORY: Social History   Tobacco Use  . Smoking status: Former Smoker    Packs/day: 1.00    Years: 30.00    Pack years: 30.00    Types: Cigarettes    Last attempt to quit: 01/19/2014    Years since quitting: 4.3  . Smokeless tobacco: Former Network engineer Use Topics  . Alcohol use: No    Alcohol/week: 0.0 standard drinks  . Drug use: No    FAMILY HISTORY: Family History  Problem Relation Age of Onset  . Brain cancer Mother   . Thyroid disease Mother   . Hypertension Mother   . Breast cancer Maternal Aunt 26  . Breast cancer Cousin 35    ROS: Review of Systems  Constitutional: Positive for malaise/fatigue. Negative for weight loss.  HENT:       Positive for nasal stuffiness.  Eyes:       Wears glasses or contacts.  Respiratory: Positive for shortness of breath ( with activity).   Cardiovascular: Negative for chest pain and orthopnea.  Gastrointestinal: Positive for diarrhea.  Musculoskeletal: Positive for back pain. Negative for myalgias.  Neurological: Positive for headaches.  Psychiatric/Behavioral: The patient is nervous/anxious.     PHYSICAL EXAM: Blood pressure 127/78, pulse 70, temperature 97.9 F (36.6 C), temperature source Oral, height 5\' 4"  (1.626 m), weight 214 lb (97.1 kg), SpO2 95 %. Body mass index is 36.73 kg/m. Physical Exam Vitals signs reviewed.    Constitutional:      Appearance: Normal appearance. She is obese.  HENT:     Head: Normocephalic and atraumatic.     Nose: Nose normal.  Eyes:     General: No scleral icterus.    Extraocular Movements: Extraocular movements intact.  Neck:     Musculoskeletal: Normal range of motion and neck supple.     Thyroid: No thyromegaly.     Comments: Negative for thyromegaly. Cardiovascular:     Rate and Rhythm: Normal rate and regular rhythm.  Pulmonary:     Effort: Pulmonary effort is normal. No respiratory distress.  Abdominal:  Palpations: Abdomen is soft.     Tenderness: There is no abdominal tenderness.     Comments: Positive for obesity.  Musculoskeletal:     Comments: ROM normal in all extremities.  Skin:    General: Skin is warm and dry.  Neurological:     Mental Status: She is alert and oriented to person, place, and time.     Coordination: Coordination normal.  Psychiatric:        Mood and Affect: Mood normal.        Behavior: Behavior normal.     RECENT LABS AND TESTS: BMET    Component Value Date/Time   NA 139 01/25/2018 0811   NA 142 08/27/2015 0955   NA 141 02/14/2014 0818   K 4.1 01/25/2018 0811   K 4.2 02/14/2014 0818   CL 103 01/25/2018 0811   CL 107 02/14/2014 0818   CO2 28 01/25/2018 0811   CO2 29 02/14/2014 0818   GLUCOSE 94 01/25/2018 0811   GLUCOSE 88 02/14/2014 0818   BUN 15 01/25/2018 0811   BUN 9 08/27/2015 0955   BUN 13 02/14/2014 0818   CREATININE 0.87 01/25/2018 0811   CALCIUM 8.7 01/25/2018 0811   CALCIUM 8.1 (L) 02/14/2014 0818   GFRNONAA 67 01/25/2018 0811   GFRAA 77 01/25/2018 0811   Lab Results  Component Value Date   HGBA1C 5.6 01/25/2018   No results found for: INSULIN CBC    Component Value Date/Time   WBC 5.5 01/25/2018 0811   RBC 4.44 01/25/2018 0811   HGB 12.1 01/25/2018 0811   HGB 10.3 (L) 11/21/2015 0842   HCT 37.0 01/25/2018 0811   HCT 34.1 11/21/2015 0842   PLT 304 01/25/2018 0811   PLT 350 11/21/2015 0842    MCV 83.3 01/25/2018 0811   MCV 80 11/21/2015 0842   MCV 87 02/14/2014 0818   MCH 27.3 01/25/2018 0811   MCHC 32.7 01/25/2018 0811   RDW 14.2 01/25/2018 0811   RDW 18.5 (H) 11/21/2015 0842   RDW 14.4 02/14/2014 0818   LYMPHSABS 2,019 01/25/2018 0811   LYMPHSABS 2.1 11/21/2015 0842   LYMPHSABS 2.1 02/14/2014 0818   MONOABS 455 09/29/2015 1656   MONOABS 0.4 02/14/2014 0818   EOSABS 187 01/25/2018 0811   EOSABS 0.1 11/21/2015 0842   EOSABS 0.1 02/14/2014 0818   BASOSABS 28 01/25/2018 0811   BASOSABS 0.0 11/21/2015 0842   BASOSABS 0.0 02/14/2014 0818   Iron/TIBC/Ferritin/ %Sat    Component Value Date/Time   IRON 18 (L) 08/27/2015 0955   TIBC 362 08/27/2015 0955   FERRITIN 17 08/27/2015 0955   IRONPCTSAT 5 (LL) 08/27/2015 0955   Lipid Panel     Component Value Date/Time   CHOL 207 (H) 01/25/2018 0811   CHOL 160 08/27/2015 0955   CHOL 188 02/14/2014 0818   TRIG 150 (H) 01/25/2018 0811   TRIG 101 02/14/2014 0818   HDL 58 01/25/2018 0811   HDL 52 08/27/2015 0955   HDL 55 02/14/2014 0818   CHOLHDL 3.6 01/25/2018 0811   VLDL 20 02/14/2014 0818   LDLCALC 123 (H) 01/25/2018 0811   LDLCALC 113 (H) 02/14/2014 0818   Hepatic Function Panel     Component Value Date/Time   PROT 6.3 01/25/2018 0811   PROT 6.0 08/27/2015 0955   PROT 6.4 02/14/2014 0818   ALBUMIN 3.8 08/27/2015 0955   ALBUMIN 3.3 (L) 02/14/2014 0818   AST 19 01/25/2018 0811   AST 20 02/14/2014 0818   ALT 15 01/25/2018 5885  ALT 24 02/14/2014 0818   ALKPHOS 77 08/27/2015 0955   ALKPHOS 92 02/14/2014 0818   BILITOT 0.6 01/25/2018 0811   BILITOT 0.2 08/27/2015 0955   BILITOT 0.5 02/14/2014 0818      Component Value Date/Time   TSH 1.50 01/25/2018 0811    ECG  shows NSR with a rate of 73 BPM. INDIRECT CALORIMETER done today shows a VO2 of 246 and a REE of 1711. Her calculated basal metabolic rate is 5997 thus her basal metabolic rate is better than expected.   OBESITY BEHAVIORAL INTERVENTION  VISIT  Today's visit was # 1  Starting weight: 214 lbs  Starting date: 05/29/2018 Today's weight : Weight: 214 lb (97.1 kg)  Today's date: 05/29/2018 Total lbs lost to date: 0 At least 15 minutes were spent on discussing the following behavioral intervention visit.    Most Recent Value 06/02/2013 - 06/01/2018  Height 5\' 4"  (1.626 m) 05/29/2018  Weight 214 lb (97.1 kg) 05/29/2018  BMI (Calculated) 36.72 05/29/2018  BLOOD PRESSURE - SYSTOLIC 741 08/03/9530  BLOOD PRESSURE - DIASTOLIC 78 0/23/3435  Waist Measurement  40 inches 05/29/2018   Body Fat % 50.8 % 05/29/2018  Total Body Water (lbs) 77 lbs 05/29/2018  RMR 1711 05/29/2018   ASK: We discussed the diagnosis of obesity with [name] today and [name] Kidney agreed to give [place] permission to discuss obesity behavioral modification therapy today.  ASSESS: Brynleigh has the diagnosis of obesity and her BMI today is 36.7. Lorana is in the action stage of change.   ADVISE: Lora was educated on the multiple health risks of obesity as well as the benefit of weight loss to improve her health. She was advised of the need for long term treatment and the importance of lifestyle modifications to improve her current health and to decrease her risk of future health problems.  AGREE: Multiple dietary modification options and treatment options were discussed and Rily agreed to follow the recommendations documented in the above note.  ARRANGE: Chinenye was educated on the importance of frequent visits to treat obesity as outlined per CMS and USPSTF guidelines and agreed to schedule her next follow up appointment today.   IMarcille Blanco, CMA, am acting as transcriptionist for Starlyn Skeans, MD  I have reviewed the above documentation for accuracy and completeness, and I agree with the above. -Dennard Nip, MD

## 2018-06-12 ENCOUNTER — Ambulatory Visit (INDEPENDENT_AMBULATORY_CARE_PROVIDER_SITE_OTHER): Payer: Medicare HMO | Admitting: Family Medicine

## 2018-06-12 ENCOUNTER — Encounter (INDEPENDENT_AMBULATORY_CARE_PROVIDER_SITE_OTHER): Payer: Self-pay | Admitting: Family Medicine

## 2018-06-12 VITALS — BP 124/77 | HR 85 | Ht 64.0 in | Wt 209.0 lb

## 2018-06-12 DIAGNOSIS — E559 Vitamin D deficiency, unspecified: Secondary | ICD-10-CM

## 2018-06-12 DIAGNOSIS — Z6836 Body mass index (BMI) 36.0-36.9, adult: Secondary | ICD-10-CM | POA: Diagnosis not present

## 2018-06-12 DIAGNOSIS — E7849 Other hyperlipidemia: Secondary | ICD-10-CM | POA: Diagnosis not present

## 2018-06-12 MED ORDER — VITAMIN D (ERGOCALCIFEROL) 1.25 MG (50000 UNIT) PO CAPS: 50000.0000 [IU] | ORAL_CAPSULE | ORAL | 0 refills | Status: DC

## 2018-06-12 NOTE — Progress Notes (Signed)
Office: [phone]  /  Fax: [phone]   HPI:   Chief Complaint: OBESITY Kelly Weber is here to discuss her progress with her obesity treatment plan. She is on the Category 2 plan and is following her eating plan approximately 100% of the time. She states she is riding a stationary bike and doing floor exercises 20-25 minutes 7 times per week. Kelly Weber did very well with weight loss on her Category 2 plan. Hunger was controlled. She did well with meal planning but found dinner difficult to finish due to the increased volume. Her weight is 209 lb (94.8 kg) today and has had a weight loss of 5 pounds over a period of 2 weeks since her last visit. She has lost 5 lbs since starting treatment with [place].  Vitamin D deficiency Kelly Weber has a diagnosis of Vitamin D deficiency which is not yet at goal. She is currently taking OTC calcium and Vit D and reports positive fatigue.  Hyperlipidemia [name] has hyperlipidemia and has been trying to improve her cholesterol levels with intensive lifestyle modification including a low saturated fat diet, exercise and weight loss. She is stable on Crestor and denies any chest pain or myalgias. She reports working on her diet prescription.  ASSESSMENT AND PLAN:  Vitamin D deficiency - Plan: Vitamin D, Ergocalciferol, (DRISDOL) 1.25 MG (50000 UT) CAPS capsule  Other hyperlipidemia  Class 2 severe obesity with serious comorbidity and body mass index (BMI) of 36.0 to 36.9 in adult, unspecified obesity type ([place])  PLAN:  Vitamin D Deficiency [name] was informed that low Vitamin D levels contributes to fatigue and are associated with obesity, breast, and colon cancer. Kelly Weber agrees to continue to take OTC calcium and start prescription Vit D @ 50,000 IU every week #4 with 0 refills and will follow-up for routine testing of Vitamin D, at least 2-3 times per year. She was informed of the risk of over-replacement of Vitamin D and agrees to not increase her dose unless she discusses  this with [place] first. Kelly Weber agrees to follow-up with our clinic in 2-3 weeks.  Hyperlipidemia [name] was informed of the American Heart Association Guidelines emphasizing intensive lifestyle modifications as the first line treatment for hyperlipidemia. We discussed many lifestyle modifications today in depth, and [name] will continue to work on decreasing saturated fats such as fatty red meat, butter and many fried foods. She will also increase vegetables and lean protein in her diet and continue to work on exercise and weight loss efforts. [name] will continue on Crestor and diet and we will recheck labs in 3 months.  Obesity Kelly Weber is currently in the action stage of change. As such, her goal is to continue with weight loss efforts. She has agreed to follow the Category 2 plan. Kelly Weber has been instructed to continue her exercise regimen as noted above.  We discussed the following Behavioral Modification Strategies today: increasing lean protein intake and decreasing simple carbohydrates.   Kelly Weber has agreed to follow-up with our clinic in 2-3 weeks. She was informed of the importance of frequent follow-up visits to maximize her success with intensive lifestyle modifications for her multiple health conditions.  ALLERGIES: Allergies  Allergen Reactions  . Penicillins Other (See Comments)    MEDICATIONS: Current Outpatient Medications on File Prior to Visit  Medication Sig Dispense Refill  . Calcium Carb-Cholecalciferol (GNP CALCIUM 600 +D3 PO) Take by mouth. Reported on 08/26/2015    . fluticasone (FLONASE) 50 MCG/ACT nasal spray Place 2 sprays into both nostrils daily. Use  for 4-6 weeks then stop and use seasonally or as needed. 16 g 3  . loperamide (IMODIUM) 2 MG capsule Take 2 mg by mouth as needed for diarrhea or loose stools.    . Multiple Vitamin (MULTI-VITAMINS) TABS Take by mouth.    . rosuvastatin (CRESTOR) 10 MG tablet Take 1 tablet (10 mg total) by mouth at bedtime. 90 tablet 1  . Skin  Protectants, Misc. (EUCERIN) cream Apply topically as needed for dry skin.    . valACYclovir (VALTREX) 1000 MG tablet     . venlafaxine XR (EFFEXOR-XR) 37.5 MG 24 hr capsule Take 1 capsule (37.5 mg total) by mouth daily with breakfast. 90 capsule 1   No current facility-administered medications on file prior to visit.     PAST MEDICAL HISTORY: Past Medical History:  Diagnosis Date  . Allergies   . Anxiety   . Arthritis    fingers, left knee  . COPD (chronic obstructive pulmonary disease) ([place])   . Depression   . Diverticulitis   . Dyspnea   . Fatigue   . GERD (gastroesophageal reflux disease)   . HLD (hyperlipidemia)   . Insomnia 2006  . Lower back pain   . Skin cancer 2007   Basal Cell CA resected from Left middle finger    PAST SURGICAL HISTORY: Past Surgical History:  Procedure Laterality Date  . BREAST CYST ASPIRATION    . COLONOSCOPY WITH PROPOFOL N/A 11/20/2014   Procedure: COLONOSCOPY WITH PROPOFOL;  Surgeon: Lucilla Lame, MD;  Location: [facility];  Service: Endoscopy;  Laterality: N/A;  . ESOPHAGOGASTRODUODENOSCOPY (EGD) WITH PROPOFOL N/A 11/20/2014   Procedure: ESOPHAGOGASTRODUODENOSCOPY (EGD) WITH PROPOFOL;  Surgeon: Lucilla Lame, MD;  Location: [facility];  Service: Endoscopy;  Laterality: N/A;  . POLYPECTOMY  11/20/2014   Procedure: POLYPECTOMY;  Surgeon: Lucilla Lame, MD;  Location: [facility];  Service: Endoscopy;;  . SKIN CANCER EXCISION Left    finger  . TUBAL LIGATION  1970    SOCIAL HISTORY: Social History   Tobacco Use  . Smoking status: Former Smoker    Packs/day: 1.00    Years: 30.00    Pack years: 30.00    Types: Cigarettes    Last attempt to quit: 01/19/2014    Years since quitting: 4.3  . Smokeless tobacco: Former Network engineer Use Topics  . Alcohol use: No    Alcohol/week: 0.0 standard drinks  . Drug use: No    FAMILY HISTORY: Family History  Problem Relation Age of Onset  . Brain cancer Mother   .  Thyroid disease Mother   . Hypertension Mother   . Breast cancer Maternal Aunt 26  . Breast cancer Cousin 35   ROS: Review of Systems  Constitutional: Positive for malaise/fatigue and weight loss.   PHYSICAL EXAM: Blood pressure 124/77, pulse 85, height 5\' 4"  (1.626 m), weight 209 lb (94.8 kg), SpO2 95 %. Body mass index is 35.87 kg/m. Physical Exam Vitals signs reviewed.  Constitutional:      Appearance: Normal appearance. She is obese.  Cardiovascular:     Rate and Rhythm: Normal rate.     Pulses: Normal pulses.  Pulmonary:     Effort: Pulmonary effort is normal.     Breath sounds: Normal breath sounds.  Musculoskeletal: Normal range of motion.  Skin:    General: Skin is warm and dry.  Neurological:     Mental Status: She is alert and oriented to person, place, and time.  Psychiatric:  Behavior: Behavior normal.   RECENT LABS AND TESTS: BMET    Component Value Date/Time   NA 141 05/29/2018 1125   NA 141 02/14/2014 0818   K 4.3 05/29/2018 1125   K 4.2 02/14/2014 0818   CL 102 05/29/2018 1125   CL 107 02/14/2014 0818   CO2 24 05/29/2018 1125   CO2 29 02/14/2014 0818   GLUCOSE 87 05/29/2018 1125   GLUCOSE 94 01/25/2018 0811   GLUCOSE 88 02/14/2014 0818   BUN 12 05/29/2018 1125   BUN 13 02/14/2014 0818   CREATININE 0.86 05/29/2018 1125   CREATININE 0.87 01/25/2018 0811   CALCIUM 8.9 05/29/2018 1125   CALCIUM 8.1 (L) 02/14/2014 0818   GFRNONAA 67 05/29/2018 1125   GFRNONAA 67 01/25/2018 0811   GFRAA 78 05/29/2018 1125   GFRAA 77 01/25/2018 0811   Lab Results  Component Value Date   HGBA1C 5.6 05/29/2018   HGBA1C 5.6 01/25/2018   HGBA1C 5.4 12/17/2016   Lab Results  Component Value Date   INSULIN 5.9 05/29/2018   CBC    Component Value Date/Time   WBC 6.6 05/29/2018 1125   WBC 5.5 01/25/2018 0811   RBC 4.42 05/29/2018 1125   RBC 4.44 01/25/2018 0811   HGB 11.9 05/29/2018 1125   HCT 37.4 05/29/2018 1125   PLT 304 01/25/2018 0811   PLT 350  11/21/2015 0842   MCV 85 05/29/2018 1125   MCV 87 02/14/2014 0818   MCH 26.9 05/29/2018 1125   MCH 27.3 01/25/2018 0811   MCHC 31.8 05/29/2018 1125   MCHC 32.7 01/25/2018 0811   RDW 14.5 05/29/2018 1125   RDW 14.4 02/14/2014 0818   LYMPHSABS 2.4 05/29/2018 1125   LYMPHSABS 2.1 02/14/2014 0818   MONOABS 455 09/29/2015 1656   MONOABS 0.4 02/14/2014 0818   EOSABS 0.1 05/29/2018 1125   EOSABS 0.1 02/14/2014 0818   BASOSABS 0.0 05/29/2018 1125   BASOSABS 0.0 02/14/2014 0818   Iron/TIBC/Ferritin/ %Sat    Component Value Date/Time   IRON 18 (L) 08/27/2015 0955   TIBC 362 08/27/2015 0955   FERRITIN 17 08/27/2015 0955   IRONPCTSAT 5 (LL) 08/27/2015 0955   Lipid Panel     Component Value Date/Time   CHOL 133 05/29/2018 1125   CHOL 188 02/14/2014 0818   TRIG 101 05/29/2018 1125   TRIG 101 02/14/2014 0818   HDL 60 05/29/2018 1125   HDL 55 02/14/2014 0818   CHOLHDL 3.6 01/25/2018 0811   VLDL 20 02/14/2014 0818   LDLCALC 53 05/29/2018 1125   LDLCALC 123 (H) 01/25/2018 0811   LDLCALC 113 (H) 02/14/2014 0818   Hepatic Function Panel     Component Value Date/Time   PROT 6.5 05/29/2018 1125   PROT 6.4 02/14/2014 0818   ALBUMIN 4.1 05/29/2018 1125   ALBUMIN 3.3 (L) 02/14/2014 0818   AST 26 05/29/2018 1125   AST 20 02/14/2014 0818   ALT 26 05/29/2018 1125   ALT 24 02/14/2014 0818   ALKPHOS 85 05/29/2018 1125   ALKPHOS 92 02/14/2014 0818   BILITOT 0.5 05/29/2018 1125   BILITOT 0.5 02/14/2014 0818      Component Value Date/Time   TSH 2.230 05/29/2018 1125   TSH 1.50 01/25/2018 0811   TSH 2.270 08/27/2015 0955    Ref. Range 05/29/2018 11:25  Vitamin D, 25-Hydroxy Latest Ref Range: 30.0 - 100.0 ng/mL 34.8   OBESITY BEHAVIORAL INTERVENTION VISIT  Today's visit was #2  Starting weight: 214 lbs Starting date: 05/29/2018 Today's weight: 209  lbs Today's date: 06/12/2018 Total lbs lost to date: 5 At least 15 minutes were spent on discussing the following behavioral  intervention visit.    06/12/2018  Height 5\' 4"  (1.626 m)  Weight 209 lb (94.8 kg)  BMI (Calculated) 35.86  BLOOD PRESSURE - SYSTOLIC 585  BLOOD PRESSURE - DIASTOLIC 77   Body Fat % 27.7 %  Total Body Water (lbs) 75.8 lbs   ASK: We discussed the diagnosis of obesity with [name] today and Jamirra agreed to give [place] permission to discuss obesity behavioral modification therapy today.  ASSESS: Aliany has the diagnosis of obesity and her BMI today is 35.86. [place] is in the action stage of change.   ADVISE: Shulamis was educated on the multiple health risks of obesity as well as the benefit of weight loss to improve her health. She was advised of the need for long term treatment and the importance of lifestyle modifications to improve her current health and to decrease her risk of future health problems.  AGREE: Multiple dietary modification options and treatment options were discussed and  Doni agreed to follow the recommendations documented in the above note.  ARRANGE: Seairra was educated on the importance of frequent visits to treat obesity as outlined per CMS and USPSTF guidelines and agreed to schedule her next follow up appointment today.  I, Michaelene Song, am acting as Location manager for Dennard Nip, MD  I have reviewed the above documentation for accuracy and completeness, and I agree with the above. -Dennard Nip, MD

## 2018-06-19 ENCOUNTER — Ambulatory Visit
Admission: RE | Admit: 2018-06-19 | Discharge: 2018-06-19 | Disposition: A | Discharge status: Home / Self Care | Payer: Medicare HMO | Source: Ambulatory Visit | Attending: Family Medicine | Admitting: Family Medicine

## 2018-06-19 ENCOUNTER — Other Ambulatory Visit: Payer: Self-pay

## 2018-06-19 ENCOUNTER — Encounter: Payer: Self-pay | Admitting: Family Medicine

## 2018-06-19 ENCOUNTER — Ambulatory Visit (INDEPENDENT_AMBULATORY_CARE_PROVIDER_SITE_OTHER): Payer: Medicare HMO | Admitting: Family Medicine

## 2018-06-19 VITALS — BP 110/70 | HR 72 | Temp 98.0°F | Resp 16 | Ht 64.0 in | Wt 214.6 lb

## 2018-06-19 DIAGNOSIS — S299XXA Unspecified injury of thorax, initial encounter: Secondary | ICD-10-CM | POA: Diagnosis not present

## 2018-06-19 DIAGNOSIS — S20211A Contusion of right front wall of thorax, initial encounter: Secondary | ICD-10-CM

## 2018-06-19 DIAGNOSIS — J209 Acute bronchitis, unspecified: Secondary | ICD-10-CM

## 2018-06-19 DIAGNOSIS — R0781 Pleurodynia: Secondary | ICD-10-CM | POA: Diagnosis not present

## 2018-06-19 MED ORDER — AZITHROMYCIN 250 MG PO TABS: ORAL_TABLET | ORAL | 0 refills | Status: DC

## 2018-06-19 MED ORDER — NAPROXEN 500 MG PO TABS: 500.0000 mg | ORAL_TABLET | Freq: Two times a day (BID) | ORAL | 0 refills | Status: DC

## 2018-06-19 MED ORDER — BACLOFEN 10 MG PO TABS: 5.0000 mg | ORAL_TABLET | Freq: Three times a day (TID) | ORAL | 1 refills | Status: DC | PRN

## 2018-06-19 NOTE — Progress Notes (Signed)
Subjective:    Patient ID: Kelly Weber, female    DOB: 1945/11/23, 73 y.o.   MRN: [id number]  Kelly Weber is a 74 y.o. female presenting on 06/19/2018 for Rib Pain (as per patient she was exercising on Saturday and handle of stationary Bike hit on RIGHT side of the Ribs) and Nasal Congestion (Runny nose denies chills or fever uses nasal sprey improves her Sxs, wheezing while lying down)  Patient presents for a same day appointment.  HPI   RIGHT RIB CONTUSION, Pain / Fall Injury Reported that 2 days ago she was attempting to get on to stationary bike for exercise at home, and she accidentally fell and pulled the bike down and the handle bar hit her in the R rib cage and it hurt localized over that area rib cage in her front. Admits worse pain with coughing and pressure from bra. Some soreness as well if lay on R side. - Taking Ibuprofen 200mg  x 2 per dose about twice a day with some mild relief - Not tried heating pad yet - Denies any bruising, bleeding, laceration, redness, other injury  Sinusitis / Chest Congestion Reports for past 3 weeks, seems intermittent with sinus drainage and post nasal drainage, taking nose spray PRN Flonase with some relief. Otherwise no other medicines, now in past 1-2 week worse chest congestion, thicker productive cough mostly clear, but takes several cough to get it out - No sick contact - Denies any shortness of breath, fevers chills sweats, wheezing, nausea vomiting   Depression screen [facility] 2/9 05/29/2018 02/01/2018 10/26/2017  Decreased Interest 1 0 2  Down, Depressed, Hopeless 1 0 2  PHQ - 2 Score 2 0 4  Altered sleeping 0 0 0  Tired, decreased energy 3 0 0  Change in appetite 1 0 2  Feeling bad or failure about yourself  1 0 1  Trouble concentrating 0 0 1  Moving slowly or fidgety/restless 0 0 1  Suicidal thoughts 0 0 0  PHQ-9 Score 7 0 9  Difficult doing work/chores Not difficult at all Not difficult at all Somewhat difficult  Some recent data  might be hidden    Social History   Tobacco Use  . Smoking status: Former Smoker    Packs/day: 1.00    Years: 30.00    Pack years: 30.00    Types: Cigarettes    Last attempt to quit: 01/19/2014    Years since quitting: 4.4  . Smokeless tobacco: Former Network engineer Use Topics  . Alcohol use: No    Alcohol/week: 0.0 standard drinks  . Drug use: No    Review of Systems Per HPI unless specifically indicated above     Objective:    BP 110/70   Pulse 72   Temp 98 F (36.7 C) (Oral)   Resp 16   Ht 5\' 4"  (1.626 m)   Wt 214 lb 9.6 oz (97.3 kg)   SpO2 98%   BMI 36.84 kg/m   Wt Readings from Last 3 Encounters:  06/19/18 214 lb 9.6 oz (97.3 kg)  06/12/18 209 lb (94.8 kg)  05/29/18 214 lb (97.1 kg)    Physical Exam Vitals signs and nursing note reviewed.  Constitutional:      General: She is not in acute distress.    Appearance: She is well-developed. She is not diaphoretic.     Comments: Well-appearing, comfortable, cooperative  HENT:     Head: Normocephalic and atraumatic.     Comments: Frontal /  maxillary sinuses non-tender. Nares with turbinate edema and congestion without purulence. Bilateral TMs clear without erythema, effusion or bulging. Oropharynx clear without erythema, exudates, edema or asymmetry. Eyes:     General:        Right eye: No discharge.        Left eye: No discharge.     Conjunctiva/sclera: Conjunctivae normal.  Neck:     Musculoskeletal: Normal range of motion and neck supple.     Thyroid: No thyromegaly.  Cardiovascular:     Rate and Rhythm: Normal rate and regular rhythm.     Heart sounds: Normal heart sounds. No murmur.  Pulmonary:     Effort: Pulmonary effort is normal. No respiratory distress.     Breath sounds: Rhonchi present. No wheezing or rales.     Comments: Slightly reduced air movement due to discomfort on deep inspiration. But speaks full sentences  Some lower coarse rhonchi clear with cough Musculoskeletal:     Comments:  Localized bony tenderness R lower Ribcage over rib  Lymphadenopathy:     Cervical: No cervical adenopathy.  Skin:    General: Skin is warm and dry.     Findings: No erythema or rash.  Neurological:     Mental Status: She is alert and oriented to person, place, and time.  Psychiatric:        Behavior: Behavior normal.     Comments: Well groomed, good eye contact, normal speech and thoughts    Results for orders placed or performed in visit on 05/29/18  Comprehensive metabolic panel  Result Value Ref Range   Glucose 87 65 - 99 mg/dL   BUN 12 8 - 27 mg/dL   Creatinine, Ser 0.86 0.57 - 1.00 mg/dL   GFR calc non Af Amer 67 >59 mL/min/1.73   GFR calc Af Amer 78 >59 mL/min/1.73   BUN/Creatinine Ratio 14 12 - 28   Sodium 141 134 - 144 mmol/L   Potassium 4.3 3.5 - 5.2 mmol/L   Chloride 102 96 - 106 mmol/L   CO2 24 20 - 29 mmol/L   Calcium 8.9 8.7 - 10.3 mg/dL   Total Protein 6.5 6.0 - 8.5 g/dL   Albumin 4.1 3.7 - 4.7 g/dL   Globulin, Total 2.4 1.5 - 4.5 g/dL   Albumin/Globulin Ratio 1.7 1.2 - 2.2   Bilirubin Total 0.5 0.0 - 1.2 mg/dL   Alkaline Phosphatase 85 39 - 117 IU/L   AST 26 0 - 40 IU/L   ALT 26 0 - 32 IU/L  CBC With Differential  Result Value Ref Range   WBC 6.6 3.4 - 10.8 x10E3/uL   RBC 4.42 3.77 - 5.28 x10E6/uL   Hemoglobin 11.9 11.1 - 15.9 g/dL   Hematocrit 37.4 34.0 - 46.6 %   MCV 85 79 - 97 fL   MCH 26.9 26.6 - 33.0 pg   MCHC 31.8 31.5 - 35.7 g/dL   RDW 14.5 11.7 - 15.4 %   Neutrophils 55 Not Estab. %   Lymphs 37 Not Estab. %   Monocytes 6 Not Estab. %   Eos 1 Not Estab. %   Basos 1 Not Estab. %   Neutrophils Absolute 3.6 1.4 - 7.0 x10E3/uL   Lymphocytes Absolute 2.4 0.7 - 3.1 x10E3/uL   Monocytes Absolute 0.4 0.1 - 0.9 x10E3/uL   EOS (ABSOLUTE) 0.1 0.0 - 0.4 x10E3/uL   Basophils Absolute 0.0 0.0 - 0.2 x10E3/uL   Immature Granulocytes 0 Not Estab. %   Immature Grans (Abs) 0.0 0.0 -  0.1 x10E3/uL  Hemoglobin A1c  Result Value Ref Range   Hgb A1c MFr Bld  5.6 4.8 - 5.6 %   Est. average glucose Bld gHb Est-mCnc 114 mg/dL  Insulin, random  Result Value Ref Range   INSULIN 5.9 2.6 - 24.9 uIU/mL  Lipid Panel With LDL/HDL Ratio  Result Value Ref Range   Cholesterol, Total 133 100 - 199 mg/dL   Triglycerides 101 0 - 149 mg/dL   HDL 60 >39 mg/dL   VLDL Cholesterol Cal 20 5 - 40 mg/dL   LDL Calculated 53 0 - 99 mg/dL   LDl/HDL Ratio 0.9 0.0 - 3.2 ratio  T3  Result Value Ref Range   T3, Total 93 71 - 180 ng/dL  T4, free  Result Value Ref Range   Free T4 0.89 0.82 - 1.77 ng/dL  TSH  Result Value Ref Range   TSH 2.230 0.450 - 4.500 uIU/mL  VITAMIN D 25 Hydroxy (Vit-D Deficiency, Fractures)  Result Value Ref Range   Vit D, 25-Hydroxy 34.8 30.0 - 100.0 ng/mL      Assessment & Plan:   Problem List Items Addressed This Visit    None    Visit Diagnoses    Rib contusion, right, initial encounter    -  Primary Clinically with localized reproducible bony pain over R lower ribcage in area of mild traumatic contusion at home due to fall / exercise bike - No evidence of ecchymosis or other deformity on exam  Reassurance, but agree to check R Rib x-ray series as precaution - pending - Trial of NSAID Naproxen 500 BID wc 1-2 weeks then PRN, stop Ibuprofen - Trial on muscle relaxant baclofen 5-10mg  TID PRN, caution sedation - May use heating pad, topical relief, tylenol PRN Relative rest - may take few weeks to heal    Relevant Medications   naproxen (NAPROSYN) 500 MG tablet   baclofen (LIORESAL) 10 MG tablet   Other Relevant Orders   DG Ribs Unilateral Right   Acute bronchitis, unspecified organism       Relevant Medications   azithromycin (ZITHROMAX Z-PAK) 250 MG tablet      Consistent with worsening bronchitis in setting of likely viral URI Concern with duration >2-3 week, recurrent episode - Afebrile, no focal signs of infection (not consistent with pneumonia by history or exam), no evidence sinusitis. Mild coarse breath sounds on  exam without obvious bronchospasm  Plan: 1. Start Azithromycin Z-pak dosing 500mg  then 250mg  daily x 4 days 2. Recommend trial OTC - Mucinex, Nasal saline, lozenges, tea with honey/lemon Return criteria reviewed, follow-up within 1 week if not improved   Meds ordered this encounter  Medications  . naproxen (NAPROSYN) 500 MG tablet    Sig: Take 1 tablet (500 mg total) by mouth 2 (two) times daily with a meal. For 2-4 weeks then as needed    Dispense:  60 tablet    Refill:  0  . baclofen (LIORESAL) 10 MG tablet    Sig: Take 0.5-1 tablets (5-10 mg total) by mouth 3 (three) times daily as needed for muscle spasms.    Dispense:  30 each    Refill:  1  . azithromycin (ZITHROMAX Z-PAK) 250 MG tablet    Sig: Take 2 tabs (500mg  total) on Day 1. Take 1 tab (250mg ) daily for next 4 days.    Dispense:  6 tablet    Refill:  0    Follow up plan: Return in about 2 weeks (around 07/03/2018), or if  symptoms worsen or fail to improve, for bronchitis, rib contusion.   Nobie Putnam, [facility] 06/19/2018, 2:48 PM

## 2018-06-19 NOTE — Patient Instructions (Addendum)
Thank you for coming to the office today.  X-ray today for R side ribs to rule out any fracture or break, sometimes x-ray misses this.  Use heating pad and warm compress to help pain.  Recommend trial of Anti-inflammatory with Naproxen (Naprosyn) 500mg  tabs - take one with food and plenty of water TWICE daily every day (breakfast and dinner), for next 1 to 2 weeks, then you may take only as needed - DO NOT TAKE any ibuprofen, aleve, motrin while you are taking this medicine - It is safe to take Tylenol Ext Str 500mg  tabs - take 1 to 2 (max dose 1000mg ) every 6 hours as needed for breakthrough pain, max 24 hour daily dose is 6 to 8 tablets or 4000mg   Start taking Baclofen (Lioresal) 10mg  (muscle relaxant) - start with half (cut) to one whole pill at night as needed for next 1-3 nights (may make you drowsy, caution with driving) see how it affects you, then if tolerated increase to one pill 2 to 3 times a day or (every 8 hours as needed)  For cough and congestion - will treat for bronchitis  Start Azithromycin Z pak (antibiotic) 2 tabs day 1, then 1 tab x 4 days, complete entire course even if improved  Start nasal steroid Flonase 2 sprays in each nostril daily for 4-6 weeks, may repeat course seasonally or as needed   Please schedule a Follow-up Appointment to: Return in about 2 weeks (around 07/03/2018), or if symptoms worsen or fail to improve, for bronchitis, rib contusion.  If you have any other questions or concerns, please feel free to call the office or send a message through [place]. You may also schedule an earlier appointment if necessary.  Additionally, you may be receiving a survey about your experience at our office within a few days to 1 week by e-mail or mail. We value your feedback.  Nobie Putnam, DO [place]

## 2018-06-20 ENCOUNTER — Telehealth: Payer: Self-pay | Admitting: Family Medicine

## 2018-06-20 NOTE — Telephone Encounter (Signed)
Called patient, reviewed x-ray, negative for rib fracture, see result note.  Nobie Putnam, [facility] 06/20/2018, 5:34 PM

## 2018-06-20 NOTE — Telephone Encounter (Signed)
Pt called for xray results [phone]

## 2018-07-02 ENCOUNTER — Other Ambulatory Visit (INDEPENDENT_AMBULATORY_CARE_PROVIDER_SITE_OTHER): Payer: Self-pay | Admitting: Family Medicine

## 2018-07-02 DIAGNOSIS — E559 Vitamin D deficiency, unspecified: Secondary | ICD-10-CM

## 2018-07-03 ENCOUNTER — Encounter (INDEPENDENT_AMBULATORY_CARE_PROVIDER_SITE_OTHER): Payer: Self-pay | Admitting: Family Medicine

## 2018-07-03 ENCOUNTER — Ambulatory Visit (INDEPENDENT_AMBULATORY_CARE_PROVIDER_SITE_OTHER): Payer: Medicare HMO | Admitting: Family Medicine

## 2018-07-03 ENCOUNTER — Other Ambulatory Visit: Payer: Self-pay

## 2018-07-03 VITALS — BP 136/82 | HR 78 | Ht 64.0 in | Wt 208.0 lb

## 2018-07-03 DIAGNOSIS — E559 Vitamin D deficiency, unspecified: Secondary | ICD-10-CM

## 2018-07-03 DIAGNOSIS — Z6835 Body mass index (BMI) 35.0-35.9, adult: Secondary | ICD-10-CM | POA: Diagnosis not present

## 2018-07-03 MED ORDER — VITAMIN D (ERGOCALCIFEROL) 1.25 MG (50000 UNIT) PO CAPS: 50000.0000 [IU] | ORAL_CAPSULE | ORAL | 0 refills | Status: DC

## 2018-07-03 NOTE — Progress Notes (Signed)
Office: [phone]  /  Fax: [phone]   HPI:   Chief Complaint: OBESITY Kelly Weber is here to discuss her progress with her obesity treatment plan. She is on the Category 2 plan and is following her eating plan approximately 95 % of the time. She states she is exercising 0 minutes 0 times per week. Kelly Weber has done well with weight loss, even while dealing with isolation due to [place]. She states her husband is doing her grocery shopping and she has most of what she needs.  Her weight is 208 lb (94.3 kg) today and has had a weight loss of 1 pound over a period of 3 weeks since her last visit. She has lost 6 lbs since starting treatment with [place].  Vitamin D Deficiency Kelly Weber has a diagnosis of vitamin D deficiency. She is currently stable on vit D, but is not yet at goal. Kelly Weber denies nausea, vomiting, or muscle weakness.  ASSESSMENT AND PLAN:  Vitamin D deficiency - Plan: Vitamin D, Ergocalciferol, (DRISDOL) 1.25 MG (50000 UT) CAPS capsule  Class 2 severe obesity with serious comorbidity and body mass index (BMI) of 35.0 to 35.9 in adult, unspecified obesity type ([place])  PLAN:  Vitamin D Deficiency Kelly Weber was informed that low vitamin D levels contribute to fatigue and are associated with obesity, breast, and colon cancer. Saidah agrees to continue to take prescription Vit D @50 ,000 IU every week #4 with no refills and will follow up for routine testing of vitamin D, at least 2-3 times per year. She was informed of the risk of over-replacement of vitamin D and agrees to not increase her dose unless she discusses this with [place] first. Velvie agrees to follow up in 2 weeks as directed.  Obesity Kelly Weber is currently in the action stage of change. As such, her goal is to continue with weight loss efforts. She has agreed to follow the Category 2 plan. Kelly Weber has been instructed to work up to a goal of 150 minutes of combined cardio and strengthening exercise per week for weight loss and overall health  benefits. We discussed the following Behavioral Modification Strategies today: increasing lean protein intake, decreasing simple carbohydrates, work on meal planning and easy cooking plans, emotional eating strategies, ways to avoid boredom eating, keeping healthy foods in the home, better snacking choices, and ways to avoid night time snacking.  Kelly Weber has agreed to follow up with our clinic in 2 weeks. She was informed of the importance of frequent follow up visits to maximize her success with intensive lifestyle modifications for her multiple health conditions.  ALLERGIES: Allergies  Allergen Reactions  . Penicillins Other (See Comments)    MEDICATIONS: Current Outpatient Medications on File Prior to Visit  Medication Sig Dispense Refill  . Calcium Carb-Cholecalciferol (GNP CALCIUM 600 +D3 PO) Take by mouth. Reported on 08/26/2015    . fluticasone (FLONASE) 50 MCG/ACT nasal spray Place 2 sprays into both nostrils daily. Use for 4-6 weeks then stop and use seasonally or as needed. 16 g 3  . loperamide (IMODIUM) 2 MG capsule Take 2 mg by mouth as needed for diarrhea or loose stools.    . Multiple Vitamin (MULTI-VITAMINS) TABS Take by mouth.    . rosuvastatin (CRESTOR) 10 MG tablet Take 1 tablet (10 mg total) by mouth at bedtime. 90 tablet 1  . Skin Protectants, Misc. (EUCERIN) cream Apply topically as needed for dry skin.    . valACYclovir (VALTREX) 1000 MG tablet     . venlafaxine XR (EFFEXOR-XR)  37.5 MG 24 hr capsule Take 1 capsule (37.5 mg total) by mouth daily with breakfast. 90 capsule 1  . Vitamin D, Ergocalciferol, (DRISDOL) 1.25 MG (50000 UT) CAPS capsule Take 1 capsule (50,000 Units total) by mouth every 7 (seven) days. 4 capsule 0   No current facility-administered medications on file prior to visit.     PAST MEDICAL HISTORY: Past Medical History:  Diagnosis Date  . Allergies   . Anxiety   . Arthritis    fingers, left knee  . COPD (chronic obstructive pulmonary disease)  ([place])   . Depression   . Diverticulitis   . Dyspnea   . Fatigue   . GERD (gastroesophageal reflux disease)   . HLD (hyperlipidemia)   . Insomnia 2006  . Lower back pain   . Skin cancer 2007   Basal Cell CA resected from Left middle finger    PAST SURGICAL HISTORY: Past Surgical History:  Procedure Laterality Date  . BREAST CYST ASPIRATION    . COLONOSCOPY WITH PROPOFOL N/A 11/20/2014   Procedure: COLONOSCOPY WITH PROPOFOL;  Surgeon: Lucilla Lame, MD;  Location: [place];  Service: Endoscopy;  Laterality: N/A;  . ESOPHAGOGASTRODUODENOSCOPY (EGD) WITH PROPOFOL N/A 11/20/2014   Procedure: ESOPHAGOGASTRODUODENOSCOPY (EGD) WITH PROPOFOL;  Surgeon: Lucilla Lame, MD;  Location: [place];  Service: Endoscopy;  Laterality: N/A;  . POLYPECTOMY  11/20/2014   Procedure: POLYPECTOMY;  Surgeon: Lucilla Lame, MD;  Location: [place];  Service: Endoscopy;;  . SKIN CANCER EXCISION Left    finger  . TUBAL LIGATION  1970    SOCIAL HISTORY: Social History   Tobacco Use  . Smoking status: Former Smoker    Packs/day: 1.00    Years: 30.00    Pack years: 30.00    Types: Cigarettes    Last attempt to quit: 01/19/2014    Years since quitting: 4.4  . Smokeless tobacco: Former Network engineer Use Topics  . Alcohol use: No    Alcohol/week: 0.0 standard drinks  . Drug use: No    FAMILY HISTORY: Family History  Problem Relation Age of Onset  . Brain cancer Mother   . Thyroid disease Mother   . Hypertension Mother   . Breast cancer Maternal Aunt 26  . Breast cancer Cousin 35   ROS: Review of Systems  Constitutional: Positive for weight loss.  Gastrointestinal: Negative for nausea and vomiting.  Musculoskeletal:       Negative for muscle weakness.   PHYSICAL EXAM: Blood pressure 136/82, pulse 78, height 5\' 4"  (1.626 m), weight 208 lb (94.3 kg), SpO2 98 %. Body mass index is 35.7 kg/m. Physical Exam Vitals signs reviewed.  Constitutional:      Appearance:  Normal appearance. She is obese.  Cardiovascular:     Rate and Rhythm: Normal rate.  Pulmonary:     Effort: Pulmonary effort is normal.  Musculoskeletal: Normal range of motion.  Skin:    General: Skin is warm and dry.  Neurological:     Mental Status: She is alert and oriented to person, place, and time.  Psychiatric:        Mood and Affect: Mood normal.        Behavior: Behavior normal.    RECENT LABS AND TESTS: BMET    Component Value Date/Time   NA 141 05/29/2018 1125   NA 141 02/14/2014 0818   K 4.3 05/29/2018 1125   K 4.2 02/14/2014 0818   CL 102 05/29/2018 1125   CL 107 02/14/2014 0818  CO2 24 05/29/2018 1125   CO2 29 02/14/2014 0818   GLUCOSE 87 05/29/2018 1125   GLUCOSE 94 01/25/2018 0811   GLUCOSE 88 02/14/2014 0818   BUN 12 05/29/2018 1125   BUN 13 02/14/2014 0818   CREATININE 0.86 05/29/2018 1125   CREATININE 0.87 01/25/2018 0811   CALCIUM 8.9 05/29/2018 1125   CALCIUM 8.1 (L) 02/14/2014 0818   GFRNONAA 67 05/29/2018 1125   GFRNONAA 67 01/25/2018 0811   GFRAA 78 05/29/2018 1125   GFRAA 77 01/25/2018 0811   Lab Results  Component Value Date   HGBA1C 5.6 05/29/2018   HGBA1C 5.6 01/25/2018   HGBA1C 5.4 12/17/2016   Lab Results  Component Value Date   INSULIN 5.9 05/29/2018   CBC    Component Value Date/Time   WBC 6.6 05/29/2018 1125   WBC 5.5 01/25/2018 0811   RBC 4.42 05/29/2018 1125   RBC 4.44 01/25/2018 0811   HGB 11.9 05/29/2018 1125   HCT 37.4 05/29/2018 1125   PLT 304 01/25/2018 0811   PLT 350 11/21/2015 0842   MCV 85 05/29/2018 1125   MCV 87 02/14/2014 0818   MCH 26.9 05/29/2018 1125   MCH 27.3 01/25/2018 0811   MCHC 31.8 05/29/2018 1125   MCHC 32.7 01/25/2018 0811   RDW 14.5 05/29/2018 1125   RDW 14.4 02/14/2014 0818   LYMPHSABS 2.4 05/29/2018 1125   LYMPHSABS 2.1 02/14/2014 0818   MONOABS 455 09/29/2015 1656   MONOABS 0.4 02/14/2014 0818   EOSABS 0.1 05/29/2018 1125   EOSABS 0.1 02/14/2014 0818   BASOSABS 0.0 05/29/2018  1125   BASOSABS 0.0 02/14/2014 0818   Iron/TIBC/Ferritin/ %Sat    Component Value Date/Time   IRON 18 (L) 08/27/2015 0955   TIBC 362 08/27/2015 0955   FERRITIN 17 08/27/2015 0955   IRONPCTSAT 5 (LL) 08/27/2015 0955   Lipid Panel     Component Value Date/Time   CHOL 133 05/29/2018 1125   CHOL 188 02/14/2014 0818   TRIG 101 05/29/2018 1125   TRIG 101 02/14/2014 0818   HDL 60 05/29/2018 1125   HDL 55 02/14/2014 0818   CHOLHDL 3.6 01/25/2018 0811   VLDL 20 02/14/2014 0818   LDLCALC 53 05/29/2018 1125   LDLCALC 123 (H) 01/25/2018 0811   LDLCALC 113 (H) 02/14/2014 0818   Hepatic Function Panel     Component Value Date/Time   PROT 6.5 05/29/2018 1125   PROT 6.4 02/14/2014 0818   ALBUMIN 4.1 05/29/2018 1125   ALBUMIN 3.3 (L) 02/14/2014 0818   AST 26 05/29/2018 1125   AST 20 02/14/2014 0818   ALT 26 05/29/2018 1125   ALT 24 02/14/2014 0818   ALKPHOS 85 05/29/2018 1125   ALKPHOS 92 02/14/2014 0818   BILITOT 0.5 05/29/2018 1125   BILITOT 0.5 02/14/2014 0818      Component Value Date/Time   TSH 2.230 05/29/2018 1125   TSH 1.50 01/25/2018 0811   TSH 2.270 08/27/2015 0955   Results for FLORIE, CARICO (MRN [id number]) as of 07/03/2018 13:25  Ref. Range 05/29/2018 11:25  Vitamin D, 25-Hydroxy Latest Ref Range: 30.0 - 100.0 ng/mL 34.8   OBESITY BEHAVIORAL INTERVENTION VISIT  Today's visit was # 3  Starting weight: 214 lbs Starting date: 05/29/18 Today's weight : Weight: 208 lb (94.3 kg)  Today's date: 07/03/2018 Total lbs lost to date: 6 At least 15 minutes were spent on discussing the following behavioral intervention visit.    07/03/2018  Height 5\' 4"  (1.626 m)  Weight 208 lb (  94.3 kg)  BMI (Calculated) 35.69  BLOOD PRESSURE - SYSTOLIC 131  BLOOD PRESSURE - DIASTOLIC 82   Body Fat % 43.8 %  Total Body Water (lbs) 72.6 lbs    ASK: We discussed the diagnosis of obesity with [name] today and Kesia agreed to give [place] permission to discuss obesity behavioral  modification therapy today.  ASSESS: Andrew has the diagnosis of obesity and her BMI today is 35.69. Giliana is in the action stage of change.   ADVISE: Jessie was educated on the multiple health risks of obesity as well as the benefit of weight loss to improve her health. She was advised of the need for long term treatment and the importance of lifestyle modifications to improve her current health and to decrease her risk of future health problems.  AGREE: Multiple dietary modification options and treatment options were discussed and Daffney agreed to follow the recommendations documented in the above note.  ARRANGE: Amarianna was educated on the importance of frequent visits to treat obesity as outlined per CMS and USPSTF guidelines and agreed to schedule her next follow up appointment today.  IMarcille Blanco, CMA, am acting as transcriptionist for Starlyn Skeans, MD  I have reviewed the above documentation for accuracy and completeness, and I agree with the above. -Dennard Nip, MD

## 2018-07-04 ENCOUNTER — Encounter (INDEPENDENT_AMBULATORY_CARE_PROVIDER_SITE_OTHER): Payer: Self-pay

## 2018-07-06 ENCOUNTER — Other Ambulatory Visit: Payer: Self-pay | Admitting: Family Medicine

## 2018-07-06 DIAGNOSIS — J3089 Other allergic rhinitis: Secondary | ICD-10-CM

## 2018-07-18 ENCOUNTER — Other Ambulatory Visit: Payer: Self-pay

## 2018-07-18 ENCOUNTER — Ambulatory Visit (INDEPENDENT_AMBULATORY_CARE_PROVIDER_SITE_OTHER): Payer: Medicare HMO | Admitting: Family Medicine

## 2018-07-18 ENCOUNTER — Encounter (INDEPENDENT_AMBULATORY_CARE_PROVIDER_SITE_OTHER): Payer: Self-pay | Admitting: Family Medicine

## 2018-07-18 DIAGNOSIS — Z6835 Body mass index (BMI) 35.0-35.9, adult: Secondary | ICD-10-CM | POA: Diagnosis not present

## 2018-07-18 DIAGNOSIS — G4709 Other insomnia: Secondary | ICD-10-CM | POA: Diagnosis not present

## 2018-07-18 NOTE — Progress Notes (Signed)
Office: [phone]  /  Fax: [phone] TeleHealth Visit:  Kelly Weber has verbally consented to this TeleHealth visit today. The patient is located at home, the provider is located at the [facility] office. The participants in this visit include the listed provider and patient. The visit was conducted today via Skype.  HPI:   Chief Complaint: OBESITY Kelly Weber is here to discuss her progress with her obesity treatment plan. She is on the Category 2 plan and is following her eating plan approximately 100 % of the time. She states she is riding the stationary bike 15 minutes 4 times per week. Kelly Weber states that she is doing well on her diet prescription. She is getting good support from her husband and keeping herself entertained and busy while socially isolating herself.  We were unable to weigh the patient today for this TeleHealth visit. She feels as if she has lost weight since her last visit. She has lost 6 lbs since starting treatment with [place].  Insomnia Joy notes increased difficulty falling asleep, which has been worse in the last 2 weeks since social isolation due to Kelly Weber. She is not using anything to help her sleep and states that she stays asleep well.  ASSESSMENT AND PLAN:  Other insomnia  Class 2 severe obesity with serious comorbidity and body mass index (BMI) of 35.0 to 35.9 in adult, unspecified obesity type ([place])  PLAN:  Insomnia We discussed with Kelly Weber some sleep hygiene techniques, such as a cool bedroom, no screens before bed, listening to audiobook, or white noise sounds. Avoidance of caffeine sources was strongly encouraged and Kelly Weber agreed to try these strategies and to follow up in 2 weeks.  I spent > than 50% of the 15 minute visit on counseling as documented in the note.  Obesity Kelly Weber is currently in the action stage of change. As such, her goal is to continue with weight loss efforts. She has agreed to follow the Category 2 plan. Kelly Weber has  been instructed to work up to a goal of 150 minutes of combined cardio and strengthening exercise per week for weight loss and overall health benefits. We discussed the following Behavioral Modification Strategies today: ways to avoid night time snacking, keeping healthy foods in the home, and better snacking choices.  Kelly Weber has agreed to follow up with our clinic in 2 weeks. She was informed of the importance of frequent follow up visits to maximize her success with intensive lifestyle modifications for her multiple health conditions.  ALLERGIES: Allergies  Allergen Reactions  . Penicillins Other (See Comments)    MEDICATIONS: Current Outpatient Medications on File Prior to Visit  Medication Sig Dispense Refill  . Calcium Carb-Cholecalciferol (GNP CALCIUM 600 +D3 PO) Take by mouth. Reported on 08/26/2015    . fluticasone (FLONASE) 50 MCG/ACT nasal spray PLACE 2 SPRAYS INTO BOTH NOSTRILS DAILY. USE FOR 4-6 WEEKS THEN STOP AND USE SEASONALLY OR AS NEEDED 48 g 1  . loperamide (IMODIUM) 2 MG capsule Take 2 mg by mouth as needed for diarrhea or loose stools.    . Multiple Vitamin (MULTI-VITAMINS) TABS Take by mouth.    . rosuvastatin (CRESTOR) 10 MG tablet Take 1 tablet (10 mg total) by mouth at bedtime. 90 tablet 1  . Skin Protectants, Misc. (EUCERIN) cream Apply topically as needed for dry skin.    . valACYclovir (VALTREX) 1000 MG tablet     . venlafaxine XR (EFFEXOR-XR) 37.5 MG 24 hr capsule Take 1 capsule (37.5 mg total)  by mouth daily with breakfast. 90 capsule 1  . Vitamin D, Ergocalciferol, (DRISDOL) 1.25 MG (50000 UT) CAPS capsule Take 1 capsule (50,000 Units total) by mouth every 7 (seven) days. 4 capsule 0   No current facility-administered medications on file prior to visit.     PAST MEDICAL HISTORY: Past Medical History:  Diagnosis Date  . Allergies   . Anxiety   . Arthritis    fingers, left knee  . COPD (chronic obstructive pulmonary disease) ([place])   . Depression   .  Diverticulitis   . Dyspnea   . Fatigue   . GERD (gastroesophageal reflux disease)   . HLD (hyperlipidemia)   . Insomnia 2006  . Lower back pain   . Skin cancer 2007   Basal Cell CA resected from Left middle finger    PAST SURGICAL HISTORY: Past Surgical History:  Procedure Laterality Date  . BREAST CYST ASPIRATION    . COLONOSCOPY WITH PROPOFOL N/A 11/20/2014   Procedure: COLONOSCOPY WITH PROPOFOL;  Surgeon: Lucilla Lame, MD;  Location: [place];  Service: Endoscopy;  Laterality: N/A;  . ESOPHAGOGASTRODUODENOSCOPY (EGD) WITH PROPOFOL N/A 11/20/2014   Procedure: ESOPHAGOGASTRODUODENOSCOPY (EGD) WITH PROPOFOL;  Surgeon: Lucilla Lame, MD;  Location: [place];  Service: Endoscopy;  Laterality: N/A;  . POLYPECTOMY  11/20/2014   Procedure: POLYPECTOMY;  Surgeon: Lucilla Lame, MD;  Location: [place];  Service: Endoscopy;;  . SKIN CANCER EXCISION Left    finger  . TUBAL LIGATION  1970    SOCIAL HISTORY: Social History   Tobacco Use  . Smoking status: Former Smoker    Packs/day: 1.00    Years: 30.00    Pack years: 30.00    Types: Cigarettes    Last attempt to quit: 01/19/2014    Years since quitting: 4.4  . Smokeless tobacco: Former Network engineer Use Topics  . Alcohol use: No    Alcohol/week: 0.0 standard drinks  . Drug use: No    FAMILY HISTORY: Family History  Problem Relation Age of Onset  . Brain cancer Mother   . Thyroid disease Mother   . Hypertension Mother   . Breast cancer Maternal Aunt 26  . Breast cancer Cousin 35    ROS: Review of Systems  Psychiatric/Behavioral: The patient has insomnia.     PHYSICAL EXAM: Pt in no acute distress  RECENT LABS AND TESTS: BMET    Component Value Date/Time   NA 141 05/29/2018 1125   NA 141 02/14/2014 0818   K 4.3 05/29/2018 1125   K 4.2 02/14/2014 0818   CL 102 05/29/2018 1125   CL 107 02/14/2014 0818   CO2 24 05/29/2018 1125   CO2 29 02/14/2014 0818   GLUCOSE 87 05/29/2018 1125    GLUCOSE 94 01/25/2018 0811   GLUCOSE 88 02/14/2014 0818   BUN 12 05/29/2018 1125   BUN 13 02/14/2014 0818   CREATININE 0.86 05/29/2018 1125   CREATININE 0.87 01/25/2018 0811   CALCIUM 8.9 05/29/2018 1125   CALCIUM 8.1 (L) 02/14/2014 0818   GFRNONAA 67 05/29/2018 1125   GFRNONAA 67 01/25/2018 0811   GFRAA 78 05/29/2018 1125   GFRAA 77 01/25/2018 0811   Lab Results  Component Value Date   HGBA1C 5.6 05/29/2018   HGBA1C 5.6 01/25/2018   HGBA1C 5.4 12/17/2016   Lab Results  Component Value Date   INSULIN 5.9 05/29/2018   CBC    Component Value Date/Time   WBC 6.6 05/29/2018 1125   WBC 5.5 01/25/2018  0811   RBC 4.42 05/29/2018 1125   RBC 4.44 01/25/2018 0811   HGB 11.9 05/29/2018 1125   HCT 37.4 05/29/2018 1125   PLT 304 01/25/2018 0811   PLT 350 11/21/2015 0842   MCV 85 05/29/2018 1125   MCV 87 02/14/2014 0818   MCH 26.9 05/29/2018 1125   MCH 27.3 01/25/2018 0811   MCHC 31.8 05/29/2018 1125   MCHC 32.7 01/25/2018 0811   RDW 14.5 05/29/2018 1125   RDW 14.4 02/14/2014 0818   LYMPHSABS 2.4 05/29/2018 1125   LYMPHSABS 2.1 02/14/2014 0818   MONOABS 455 09/29/2015 1656   MONOABS 0.4 02/14/2014 0818   EOSABS 0.1 05/29/2018 1125   EOSABS 0.1 02/14/2014 0818   BASOSABS 0.0 05/29/2018 1125   BASOSABS 0.0 02/14/2014 0818   Iron/TIBC/Ferritin/ %Sat    Component Value Date/Time   IRON 18 (L) 08/27/2015 0955   TIBC 362 08/27/2015 0955   FERRITIN 17 08/27/2015 0955   IRONPCTSAT 5 (LL) 08/27/2015 0955   Lipid Panel     Component Value Date/Time   CHOL 133 05/29/2018 1125   CHOL 188 02/14/2014 0818   TRIG 101 05/29/2018 1125   TRIG 101 02/14/2014 0818   HDL 60 05/29/2018 1125   HDL 55 02/14/2014 0818   CHOLHDL 3.6 01/25/2018 0811   VLDL 20 02/14/2014 0818   LDLCALC 53 05/29/2018 1125   LDLCALC 123 (H) 01/25/2018 0811   LDLCALC 113 (H) 02/14/2014 0818   Hepatic Function Panel     Component Value Date/Time   PROT 6.5 05/29/2018 1125   PROT 6.4 02/14/2014 0818    ALBUMIN 4.1 05/29/2018 1125   ALBUMIN 3.3 (L) 02/14/2014 0818   AST 26 05/29/2018 1125   AST 20 02/14/2014 0818   ALT 26 05/29/2018 1125   ALT 24 02/14/2014 0818   ALKPHOS 85 05/29/2018 1125   ALKPHOS 92 02/14/2014 0818   BILITOT 0.5 05/29/2018 1125   BILITOT 0.5 02/14/2014 0818      Component Value Date/Time   TSH 2.230 05/29/2018 1125   TSH 1.50 01/25/2018 0811   TSH 2.270 08/27/2015 0955   Results for ANGELINE, TRICK (MRN [id number]) as of 07/18/2018 13:25  Ref. Range 05/29/2018 11:25  Vitamin D, 25-Hydroxy Latest Ref Range: 30.0 - 100.0 ng/mL 34.8     I, Marcille Blanco, CMA, am acting as transcriptionist for Starlyn Skeans, MD I have reviewed the above documentation for accuracy and completeness, and I agree with the above. -Dennard Nip, MD

## 2018-07-20 ENCOUNTER — Other Ambulatory Visit: Payer: Self-pay | Admitting: Family Medicine

## 2018-07-20 DIAGNOSIS — F339 Major depressive disorder, recurrent, unspecified: Secondary | ICD-10-CM

## 2018-07-20 MED ORDER — VENLAFAXINE HCL ER 37.5 MG PO CP24: 37.5000 mg | ORAL_CAPSULE | Freq: Every day | ORAL | 1 refills | Status: DC

## 2018-07-20 NOTE — Telephone Encounter (Signed)
Ordered venlafaxine 37.5mg  daily, 90 day supply, [facility]  Nobie Putnam, DO [facility] 07/20/2018, 11:53 AM

## 2018-07-20 NOTE — Telephone Encounter (Signed)
Pt  Called requesting a refill on zenlafaxine  8 CL 90 day supply for [employer] [facility]

## 2018-07-24 DIAGNOSIS — B001 Herpesviral vesicular dermatitis: Secondary | ICD-10-CM | POA: Diagnosis not present

## 2018-07-26 ENCOUNTER — Other Ambulatory Visit (INDEPENDENT_AMBULATORY_CARE_PROVIDER_SITE_OTHER): Payer: Self-pay | Admitting: Family Medicine

## 2018-07-26 DIAGNOSIS — E559 Vitamin D deficiency, unspecified: Secondary | ICD-10-CM

## 2018-07-28 ENCOUNTER — Other Ambulatory Visit: Payer: Self-pay

## 2018-07-28 ENCOUNTER — Other Ambulatory Visit: Payer: Medicare HMO

## 2018-07-28 DIAGNOSIS — Z79899 Other long term (current) drug therapy: Secondary | ICD-10-CM | POA: Diagnosis not present

## 2018-07-28 DIAGNOSIS — E7800 Pure hypercholesterolemia, unspecified: Secondary | ICD-10-CM

## 2018-07-28 DIAGNOSIS — Z5181 Encounter for therapeutic drug level monitoring: Secondary | ICD-10-CM | POA: Diagnosis not present

## 2018-07-28 DIAGNOSIS — R7309 Other abnormal glucose: Secondary | ICD-10-CM | POA: Diagnosis not present

## 2018-07-29 LAB — COMPLETE METABOLIC PANEL WITH GFR
AG Ratio: 1.5 (calc) (ref 1.0–2.5)
ALT: 22 U/L (ref 6–29)
AST: 21 U/L (ref 10–35)
Albumin: 3.8 g/dL (ref 3.6–5.1)
Alkaline phosphatase (APISO): 78 U/L (ref 37–153)
BUN: 19 mg/dL (ref 7–25)
CO2: 30 mmol/L (ref 20–32)
Calcium: 8.9 mg/dL (ref 8.6–10.4)
Chloride: 104 mmol/L (ref 98–110)
Creat: 0.86 mg/dL (ref 0.60–0.93)
GFR, Est African American: 78 mL/min/{1.73_m2} (ref >=60)
GFR, Est Non African American: 67 mL/min/{1.73_m2} (ref >=60)
Globulin: 2.5 g/dL (calc) (ref 1.9–3.7)
Glucose, Bld: 91 mg/dL (ref 65–99)
Potassium: 4.3 mmol/L (ref 3.5–5.3)
Sodium: 141 mmol/L (ref 135–146)
Total Bilirubin: 0.4 mg/dL (ref 0.2–1.2)
Total Protein: 6.3 g/dL (ref 6.1–8.1)

## 2018-07-29 LAB — LIPID PANEL
Cholesterol: 167 mg/dL (ref <200)
HDL: 50 mg/dL (ref >=50)
LDL Cholesterol (Calc): 92 mg/dL (calc)
Non-HDL Cholesterol (Calc): 117 mg/dL (calc) (ref <130)
Total CHOL/HDL Ratio: 3.3 (calc) (ref <5.0)
Triglycerides: 146 mg/dL (ref <150)

## 2018-07-29 LAB — HEMOGLOBIN A1C
Hgb A1c MFr Bld: 5.8 % of total Hgb — ABNORMAL HIGH (ref <5.7)
Mean Plasma Glucose: 120 (calc)
eAG (mmol/L): 6.6 (calc)

## 2018-07-31 ENCOUNTER — Ambulatory Visit (INDEPENDENT_AMBULATORY_CARE_PROVIDER_SITE_OTHER): Payer: Medicare HMO | Admitting: Bariatrics

## 2018-07-31 ENCOUNTER — Other Ambulatory Visit: Payer: Self-pay

## 2018-07-31 ENCOUNTER — Encounter (INDEPENDENT_AMBULATORY_CARE_PROVIDER_SITE_OTHER): Payer: Self-pay | Admitting: Bariatrics

## 2018-07-31 DIAGNOSIS — E559 Vitamin D deficiency, unspecified: Secondary | ICD-10-CM

## 2018-07-31 DIAGNOSIS — G4709 Other insomnia: Secondary | ICD-10-CM

## 2018-07-31 DIAGNOSIS — Z6835 Body mass index (BMI) 35.0-35.9, adult: Secondary | ICD-10-CM

## 2018-07-31 MED ORDER — VITAMIN D (ERGOCALCIFEROL) 1.25 MG (50000 UNIT) PO CAPS: 50000.0000 [IU] | ORAL_CAPSULE | ORAL | 0 refills | Status: DC

## 2018-08-01 NOTE — Progress Notes (Signed)
Office: [phone]  /  Fax: [phone] TeleHealth Visit:  Kelly Weber has verbally consented to this TeleHealth visit today. The patient is located at home, the provider is located at the [facility] office. The participants in this visit include the listed provider and patient. [name] was unable to use Skype today and the telehealth visit was conducted via telephone.  HPI:   Chief Complaint: OBESITY Kelly Weber is here to discuss her progress with her obesity treatment plan. She is on the Category 2 plan and is following her eating plan approximately 100 % of the time. She states she is riding the stationary bike 15 minutes 4 times per week. Kelly Weber states that she has lost 5 pounds.  We were unable to weigh the patient today for this TeleHealth visit. She feels as if she has lost weight since her last visit. She has lost 6 lbs since starting treatment with [place].  Vitamin D Deficiency Kelly Weber has a diagnosis of vitamin D deficiency. She is currently on vit D. Kelly Weber denies nausea, vomiting, or muscle weakness.  Insomnia Kelly Weber is still not sleepy. We discussed sleep hygiene at her last visit.  ASSESSMENT AND PLAN:  Vitamin D deficiency - Plan: Vitamin D, Ergocalciferol, (DRISDOL) 1.25 MG (50000 UT) CAPS capsule, DISCONTINUED: Vitamin D, Ergocalciferol, (DRISDOL) 1.25 MG (50000 UT) CAPS capsule  Other insomnia  Class 2 severe obesity with serious comorbidity and body mass index (BMI) of 35.0 to 35.9 in adult, unspecified obesity type (HCC)  PLAN:  Vitamin D Deficiency Kelly Weber was informed that low vitamin D levels contribute to fatigue and are associated with obesity, breast, and colon cancer. Kelly Weber agrees to continue to take prescription Vit D @50 ,000 IU every week #4 with no refills and will follow up for routine testing of vitamin D, at least 2-3 times per year. She was informed of the risk of over-replacement of vitamin D and agrees to not increase her dose unless she discusses this  with [place] first. Kelly Weber agrees to follow up in 2 weeks as directed.  Insomnia The problem of recurrent insomnia was discussed with [name] Kidney. Avoidance of caffeine sources was strongly encouraged and sleep hygiene issues was reviewed. [name] agrees to reduce and to limit watching news programming. She will follow up with at the agreed upon time.  Obesity [name] is currently in the action stage of change. As such, her goal is to continue with weight loss efforts. She has agreed to follow the Category 2 plan with meal planning with increase protein and protein sources. She will weigh herself at home and record the amount. Kelly Weber has been instructed to continue her current exercise of riding the stationary bike. We discussed the following Behavioral Modification Strategies today: increasing lean protein intake, decreasing simple carbohydrates, increasing vegetables, increase H20 intake, decrease eating out, no skipping meals, keeping healthy foods in the home, and work on meal planning and easy cooking plans.  Kelly Weber has agreed to follow up with our clinic in 2 weeks. She was informed of the importance of frequent follow up visits to maximize her success with intensive lifestyle modifications for her multiple health conditions.  ALLERGIES: Allergies  Allergen Reactions   Kelly Weber Other (See Comments)    MEDICATIONS: Current Outpatient Medications on File Prior to Visit  Medication Sig Dispense Refill   Calcium Carb-Cholecalciferol (GNP CALCIUM 600 +D3 PO) Take by mouth. Reported on 08/26/2015     fluticasone (FLONASE) 50 MCG/ACT nasal spray PLACE 2 SPRAYS INTO BOTH NOSTRILS DAILY. USE  FOR 4-6 WEEKS THEN STOP AND USE SEASONALLY OR AS NEEDED 48 g 1   loperamide (IMODIUM) 2 MG capsule Take 2 mg by mouth as needed for diarrhea or loose stools.     Multiple Vitamin (MULTI-VITAMINS) TABS Take by mouth.     rosuvastatin (CRESTOR) 10 MG tablet Take 1 tablet (10 mg total) by mouth at bedtime. 90 tablet 1    Skin Protectants, Misc. (EUCERIN) cream Apply topically as needed for dry skin.     valACYclovir (VALTREX) 1000 MG tablet      venlafaxine XR (EFFEXOR-XR) 37.5 MG 24 hr capsule Take 1 capsule (37.5 mg total) by mouth daily with breakfast. 90 capsule 1   No current facility-administered medications on file prior to visit.     PAST MEDICAL HISTORY: Past Medical History:  Diagnosis Date   Allergies    Anxiety    Arthritis    fingers, left knee   COPD (chronic obstructive pulmonary disease) (HCC)    Depression    Diverticulitis    Dyspnea    Fatigue    GERD (gastroesophageal reflux disease)    HLD (hyperlipidemia)    Insomnia 2006   Lower back pain    Skin cancer 2007   Basal Cell CA resected from Left middle finger    PAST SURGICAL HISTORY: Past Surgical History:  Procedure Laterality Date   BREAST CYST ASPIRATION     COLONOSCOPY WITH PROPOFOL N/A 11/20/2014   Procedure: COLONOSCOPY WITH PROPOFOL;  Surgeon: Kelly Lame, MD;  Location: [place];  Service: Endoscopy;  Laterality: N/A;   ESOPHAGOGASTRODUODENOSCOPY (EGD) WITH PROPOFOL N/A 11/20/2014   Procedure: ESOPHAGOGASTRODUODENOSCOPY (EGD) WITH PROPOFOL;  Surgeon: Kelly Lame, MD;  Location: [place];  Service: Endoscopy;  Laterality: N/A;   POLYPECTOMY  11/20/2014   Procedure: POLYPECTOMY;  Surgeon: Kelly Lame, MD;  Location: [place];  Service: Endoscopy;;   SKIN CANCER EXCISION Left    finger   TUBAL LIGATION  1970    SOCIAL HISTORY: Social History   Tobacco Use   Smoking status: Former Smoker    Packs/day: 1.00    Years: 30.00    Pack years: 30.00    Types: Cigarettes    Last attempt to quit: 01/19/2014    Years since quitting: 4.5   Smokeless tobacco: Former Systems developer  Substance Use Topics   Alcohol use: No    Alcohol/week: 0.0 standard drinks   Drug use: No    FAMILY HISTORY: Family History  Problem Relation Age of Onset   Brain cancer Mother     Thyroid disease Mother    Hypertension Mother    Breast cancer Maternal Aunt 26   Breast cancer Cousin 35    ROS: Review of Systems  Gastrointestinal: Negative for nausea and vomiting.  Musculoskeletal:       Negative for muscle weakness.  Psychiatric/Behavioral: The patient has insomnia.     PHYSICAL EXAM: Pt in no acute distress  RECENT LABS AND TESTS: BMET    Component Value Date/Time   NA 141 07/28/2018 0802   NA 141 05/29/2018 1125   NA 141 02/14/2014 0818   K 4.3 07/28/2018 0802   K 4.2 02/14/2014 0818   CL 104 07/28/2018 0802   CL 107 02/14/2014 0818   CO2 30 07/28/2018 0802   CO2 29 02/14/2014 0818   GLUCOSE 91 07/28/2018 0802   GLUCOSE 88 02/14/2014 0818   BUN 19 07/28/2018 0802   BUN 12 05/29/2018 1125   BUN 13 02/14/2014  0818   CREATININE 0.86 07/28/2018 0802   CALCIUM 8.9 07/28/2018 0802   CALCIUM 8.1 (L) 02/14/2014 0818   GFRNONAA 67 07/28/2018 0802   GFRAA 78 07/28/2018 0802   Lab Results  Component Value Date   HGBA1C 5.8 (H) 07/28/2018   HGBA1C 5.6 05/29/2018   HGBA1C 5.6 01/25/2018   HGBA1C 5.4 12/17/2016   Lab Results  Component Value Date   INSULIN 5.9 05/29/2018   CBC    Component Value Date/Time   WBC 6.6 05/29/2018 1125   WBC 5.5 01/25/2018 0811   RBC 4.42 05/29/2018 1125   RBC 4.44 01/25/2018 0811   HGB 11.9 05/29/2018 1125   HCT 37.4 05/29/2018 1125   PLT 304 01/25/2018 0811   PLT 350 11/21/2015 0842   MCV 85 05/29/2018 1125   MCV 87 02/14/2014 0818   MCH 26.9 05/29/2018 1125   MCH 27.3 01/25/2018 0811   MCHC 31.8 05/29/2018 1125   MCHC 32.7 01/25/2018 0811   RDW 14.5 05/29/2018 1125   RDW 14.4 02/14/2014 0818   LYMPHSABS 2.4 05/29/2018 1125   LYMPHSABS 2.1 02/14/2014 0818   MONOABS 455 09/29/2015 1656   MONOABS 0.4 02/14/2014 0818   EOSABS 0.1 05/29/2018 1125   EOSABS 0.1 02/14/2014 0818   BASOSABS 0.0 05/29/2018 1125   BASOSABS 0.0 02/14/2014 0818   Iron/TIBC/Ferritin/ %Sat    Component Value Date/Time    IRON 18 (L) 08/27/2015 0955   TIBC 362 08/27/2015 0955   FERRITIN 17 08/27/2015 0955   IRONPCTSAT 5 (LL) 08/27/2015 0955   Lipid Panel     Component Value Date/Time   CHOL 167 07/28/2018 0802   CHOL 133 05/29/2018 1125   CHOL 188 02/14/2014 0818   TRIG 146 07/28/2018 0802   TRIG 101 02/14/2014 0818   HDL 50 07/28/2018 0802   HDL 60 05/29/2018 1125   HDL 55 02/14/2014 0818   CHOLHDL 3.3 07/28/2018 0802   VLDL 20 02/14/2014 0818   LDLCALC 92 07/28/2018 0802   LDLCALC 113 (H) 02/14/2014 0818   Hepatic Function Panel     Component Value Date/Time   PROT 6.3 07/28/2018 0802   PROT 6.5 05/29/2018 1125   PROT 6.4 02/14/2014 0818   ALBUMIN 4.1 05/29/2018 1125   ALBUMIN 3.3 (L) 02/14/2014 0818   AST 21 07/28/2018 0802   AST 20 02/14/2014 0818   ALT 22 07/28/2018 0802   ALT 24 02/14/2014 0818   ALKPHOS 85 05/29/2018 1125   ALKPHOS 92 02/14/2014 0818   BILITOT 0.4 07/28/2018 0802   BILITOT 0.5 05/29/2018 1125   BILITOT 0.5 02/14/2014 0818      Component Value Date/Time   TSH 2.230 05/29/2018 1125   TSH 1.50 01/25/2018 0811   TSH 2.270 08/27/2015 0955   Results for TAMIRRA, SIENKIEWICZ (MRN [id number]) as of 08/01/2018 07:40  Ref. Range 05/29/2018 11:25  Vitamin D, 25-Hydroxy Latest Ref Range: 30.0 - 100.0 ng/mL 34.8     I, Marcille Blanco, CMA, am acting as Location manager for [employer]. Owens Shark, DO  I have reviewed the above documentation for accuracy and completeness, and I agree with the above. -Jearld Lesch, DO

## 2018-08-04 ENCOUNTER — Ambulatory Visit (INDEPENDENT_AMBULATORY_CARE_PROVIDER_SITE_OTHER): Payer: Medicare HMO | Admitting: Family Medicine

## 2018-08-04 ENCOUNTER — Other Ambulatory Visit: Payer: Self-pay

## 2018-08-04 ENCOUNTER — Encounter: Payer: Self-pay | Admitting: Family Medicine

## 2018-08-04 DIAGNOSIS — E7800 Pure hypercholesterolemia, unspecified: Secondary | ICD-10-CM

## 2018-08-04 DIAGNOSIS — R7309 Other abnormal glucose: Secondary | ICD-10-CM

## 2018-08-04 DIAGNOSIS — J432 Centrilobular emphysema: Secondary | ICD-10-CM | POA: Diagnosis not present

## 2018-08-04 DIAGNOSIS — R69 Illness, unspecified: Secondary | ICD-10-CM | POA: Diagnosis not present

## 2018-08-04 DIAGNOSIS — F3342 Major depressive disorder, recurrent, in full remission: Secondary | ICD-10-CM | POA: Diagnosis not present

## 2018-08-04 NOTE — Progress Notes (Signed)
Virtual Visit via Telephone The purpose of this virtual visit is to provide medical care while limiting exposure to the novel coronavirus (COVID19) for both patient and office staff.  Consent was obtained for phone visit:  Yes.   Answered questions that patient had about telehealth interaction:  Yes.   I discussed the limitations, risks, security and privacy concerns of performing an evaluation and management service by telephone. I also discussed with the patient that there may be a patient responsible charge related to this service. The patient expressed understanding and agreed to proceed.  Patient Location: Home Provider Location: [facility])  ---------------------------------------------------------------------- Chief Complaint  Patient presents with  . Hyperlipidemia    f/u on elevated bs     S: Reviewed CMA documentation. I have called patient and gathered additional HPI as follows:  FOLLOW-UP INSOMNIA /Depression, Major, Chronic recurrent Improved overall PHQ/GAD - Doing well Venlafaxine 37.5mg  daily - Off Ambien now,sleeping better - Past meds failedTrazodone, Lexapro, Zoloft, ultimately discontinued or tapered off these -She has faith based support system and Christian friends to talk to  HYPERLIPIDEMIA: - Previous visit 6 month ago for physical we started Statin. Last lipid panel 07/2018 - significantly improved lipid panel and LDL - Currently taking Rosuvastatin 10mg  daily - tolerating well without myalgias  - Weight loss down 14 lbs, down to 200 lbs  COPD No recent flare up. Former smoker  Pre-Diabetes - Last visit with me 6 months ago, had prior 5.4 to 5.6. Interval update with A1c 5.8 - new problem with elevated sugar - Today patient reports still improving lifestyle, diet and wt loss Denies hypoglycemia  Patient is currently home isolation Denies any high risk travel to areas of current concern for COVID19. Denies any known or  suspected exposure to person with or possibly with COVID19.  Denies any fevers, chills, sweats, body ache, cough, shortness of breath, sinus pain or pressure, headache, abdominal pain, diarrhea  Past Medical History:  Diagnosis Date  . Allergies   . Anxiety   . Arthritis    fingers, left knee  . Depression   . Diverticulitis   . Dyspnea   . Fatigue   . GERD (gastroesophageal reflux disease)   . HLD (hyperlipidemia)   . Insomnia 2006  . Lower back pain   . Skin cancer 2007   Basal Cell CA resected from Left middle finger   Social History   Tobacco Use  . Smoking status: Former Smoker    Packs/day: 1.00    Years: 30.00    Pack years: 30.00    Types: Cigarettes    Last attempt to quit: 01/19/2014    Years since quitting: 4.5  . Smokeless tobacco: Former Network engineer Use Topics  . Alcohol use: No    Alcohol/week: 0.0 standard drinks  . Drug use: No    Current Outpatient Medications:  .  Calcium Carb-Cholecalciferol (GNP CALCIUM 600 +D3 PO), Take by mouth. Reported on 08/26/2015, Disp: , Rfl:  .  fluticasone (FLONASE) 50 MCG/ACT nasal spray, PLACE 2 SPRAYS INTO BOTH NOSTRILS DAILY. USE FOR 4-6 WEEKS THEN STOP AND USE SEASONALLY OR AS NEEDED, Disp: 48 g, Rfl: 1 .  loperamide (IMODIUM) 2 MG capsule, Take 2 mg by mouth as needed for diarrhea or loose stools., Disp: , Rfl:  .  Multiple Vitamin (MULTI-VITAMINS) TABS, Take by mouth., Disp: , Rfl:  .  rosuvastatin (CRESTOR) 10 MG tablet, Take 1 tablet (10 mg total) by mouth at bedtime., Disp: 90  tablet, Rfl: 1 .  Skin Protectants, Misc. (EUCERIN) cream, Apply topically as needed for dry skin., Disp: , Rfl:  .  venlafaxine XR (EFFEXOR-XR) 37.5 MG 24 hr capsule, Take 1 capsule (37.5 mg total) by mouth daily with breakfast., Disp: 90 capsule, Rfl: 1 .  Vitamin D, Ergocalciferol, (DRISDOL) 1.25 MG (50000 UT) CAPS capsule, Take 1 capsule (50,000 Units total) by mouth every 7 (seven) days., Disp: 4 capsule, Rfl: 0 .  valACYclovir  (VALTREX) 1000 MG tablet, , Disp: , Rfl:   Depression screen [facility] 2/9 08/04/2018 05/29/2018 02/01/2018  Decreased Interest 0 1 0  Down, Depressed, Hopeless 0 1 0  PHQ - 2 Score 0 2 0  Altered sleeping 0 0 0  Tired, decreased energy 1 3 0  Change in appetite 0 1 0  Feeling bad or failure about yourself  0 1 0  Trouble concentrating 0 0 0  Moving slowly or fidgety/restless 0 0 0  Suicidal thoughts 0 0 0  PHQ-9 Score 1 7 0  Difficult doing work/chores Not difficult at all Not difficult at all Not difficult at all  Some recent data might be hidden    GAD 7 : Generalized Anxiety Score 08/04/2018 10/26/2017  Nervous, Anxious, on Edge 0 1  Control/stop worrying 0 1  Worry too much - different things 0 1  Trouble relaxing 0 2  Restless 0 2  Easily annoyed or irritable 1 2  Afraid - awful might happen 0 1  Total GAD 7 Score 1 10  Anxiety Difficulty Not difficult at all Not difficult at all    -------------------------------------------------------------------------- O: No physical exam performed due to remote telephone encounter.  Lab results reviewed.  Recent Results (from the past 2160 hour(s))  Comprehensive metabolic panel     Status: None   Collection Time: 05/29/18 11:25 AM  Result Value Ref Range   Glucose 87 65 - 99 mg/dL   BUN 12 8 - 27 mg/dL   Creatinine, Ser 0.86 0.57 - 1.00 mg/dL   GFR calc non Af Amer 67 >59 mL/min/1.73   GFR calc Af Amer 78 >59 mL/min/1.73   BUN/Creatinine Ratio 14 12 - 28   Sodium 141 134 - 144 mmol/L   Potassium 4.3 3.5 - 5.2 mmol/L   Chloride 102 96 - 106 mmol/L   CO2 24 20 - 29 mmol/L   Calcium 8.9 8.7 - 10.3 mg/dL   Total Protein 6.5 6.0 - 8.5 g/dL   Albumin 4.1 3.7 - 4.7 g/dL    Comment:               **Please note reference interval change**   Globulin, Total 2.4 1.5 - 4.5 g/dL   Albumin/Globulin Ratio 1.7 1.2 - 2.2   Bilirubin Total 0.5 0.0 - 1.2 mg/dL   Alkaline Phosphatase 85 39 - 117 IU/L   AST 26 0 - 40 IU/L   ALT 26 0 - 32 IU/L   CBC With Differential     Status: None   Collection Time: 05/29/18 11:25 AM  Result Value Ref Range   WBC 6.6 3.4 - 10.8 x10E3/uL   RBC 4.42 3.77 - 5.28 x10E6/uL   Hemoglobin 11.9 11.1 - 15.9 g/dL   Hematocrit 37.4 34.0 - 46.6 %   MCV 85 79 - 97 fL   MCH 26.9 26.6 - 33.0 pg   MCHC 31.8 31.5 - 35.7 g/dL   RDW 14.5 11.7 - 15.4 %   Neutrophils 55 Not Estab. %   Lymphs  37 Not Estab. %   Monocytes 6 Not Estab. %   Eos 1 Not Estab. %   Basos 1 Not Estab. %   Neutrophils Absolute 3.6 1.4 - 7.0 x10E3/uL   Lymphocytes Absolute 2.4 0.7 - 3.1 x10E3/uL   Monocytes Absolute 0.4 0.1 - 0.9 x10E3/uL   EOS (ABSOLUTE) 0.1 0.0 - 0.4 x10E3/uL   Basophils Absolute 0.0 0.0 - 0.2 x10E3/uL   Immature Granulocytes 0 Not Estab. %   Immature Grans (Abs) 0.0 0.0 - 0.1 x10E3/uL  Hemoglobin A1c     Status: None   Collection Time: 05/29/18 11:25 AM  Result Value Ref Range   Hgb A1c MFr Bld 5.6 4.8 - 5.6 %    Comment:          Prediabetes: 5.7 - 6.4          Diabetes: >6.4          Glycemic control for adults with diabetes: <7.0    Est. average glucose Bld gHb Est-mCnc 114 mg/dL  Insulin, random     Status: None   Collection Time: 05/29/18 11:25 AM  Result Value Ref Range   INSULIN 5.9 2.6 - 24.9 uIU/mL  Lipid Panel With LDL/HDL Ratio     Status: None   Collection Time: 05/29/18 11:25 AM  Result Value Ref Range   Cholesterol, Total 133 100 - 199 mg/dL   Triglycerides 101 0 - 149 mg/dL   HDL 60 >39 mg/dL   VLDL Cholesterol Cal 20 5 - 40 mg/dL   LDL Calculated 53 0 - 99 mg/dL   LDl/HDL Ratio 0.9 0.0 - 3.2 ratio    Comment:                                     LDL/HDL Ratio                                             Men  Women                               1/2 Avg.Risk  1.0    1.5                                   Avg.Risk  3.6    3.2                                2X Avg.Risk  6.2    5.0                                3X Avg.Risk  8.0    6.1   T3     Status: None   Collection Time: 05/29/18  11:25 AM  Result Value Ref Range   T3, Total 93 71 - 180 ng/dL  T4, free     Status: None   Collection Time: 05/29/18 11:25 AM  Result Value Ref Range   Free T4 0.89 0.82 - 1.77 ng/dL  TSH     Status: None   Collection Time: 05/29/18 11:25  AM  Result Value Ref Range   TSH 2.230 0.450 - 4.500 uIU/mL  VITAMIN D 25 Hydroxy (Vit-D Deficiency, Fractures)     Status: None   Collection Time: 05/29/18 11:25 AM  Result Value Ref Range   Vit D, 25-Hydroxy 34.8 30.0 - 100.0 ng/mL    Comment: Vitamin D deficiency has been defined by the [place] practice guideline as a level of serum 25-OH vitamin D less than 20 ng/mL (1,2). The Endocrine Society went on to further define vitamin D insufficiency as a level between 21 and 29 ng/mL (2). 1. IOM (Institute of Medicine). 2010. Dietary reference    intakes for calcium and D. [place]: The    [employer]. 2. Holick MF, Binkley , Bischoff-Ferrari HA, et al.    Evaluation, treatment, and prevention of vitamin D    deficiency: an Endocrine Society clinical practice    guideline. JCEM. 2011 Jul; 96(7):1911-30.   Lipid panel     Status: None   Collection Time: 07/28/18  8:02 AM  Result Value Ref Range   Cholesterol 167 <200 mg/dL   HDL 50 > OR = 50 mg/dL   Triglycerides 146 <150 mg/dL   LDL Cholesterol (Calc) 92 mg/dL (calc)    Comment: Reference range: <100 . Desirable range <100 mg/dL for primary prevention;   <70 mg/dL for patients with CHD or diabetic patients  with > or = 2 CHD risk factors. [name] Kitchen LDL-C is now calculated using the Martin-Hopkins  calculation, which is a validated novel method providing  better accuracy than the Friedewald equation in the  estimation of LDL-C.  Cresenciano Genre et al. Annamaria Helling. 1610;960(45): 2061-2068  (http://education.[employer].com/faq/FAQ164)    Total CHOL/HDL Ratio 3.3 <5.0 (calc)   Non-HDL Cholesterol (Calc) 117 <130 mg/dL (calc)    Comment: For  patients with diabetes plus 1 major ASCVD risk  factor, treating to a non-HDL-C goal of <100 mg/dL  (LDL-C of <70 mg/dL) is considered a therapeutic  option.   COMPLETE METABOLIC PANEL WITH GFR     Status: None   Collection Time: 07/28/18  8:02 AM  Result Value Ref Range   Glucose, Bld 91 65 - 99 mg/dL    Comment: .            Fasting reference interval .    BUN 19 7 - 25 mg/dL   Creat 0.86 0.60 - 0.93 mg/dL    Comment: For patients >71 years of age, the reference limit for Creatinine is approximately 13% higher for people identified as African-American. .    GFR, Est Non African American 67 > OR = 60 mL/min/1.5m2   GFR, Est African American 78 > OR = 60 mL/min/1.22m2   BUN/Creatinine Ratio NOT APPLICABLE 6 - 22 (calc)   Sodium 141 135 - 146 mmol/L   Potassium 4.3 3.5 - 5.3 mmol/L   Chloride 104 98 - 110 mmol/L   CO2 30 20 - 32 mmol/L   Calcium 8.9 8.6 - 10.4 mg/dL   Total Protein 6.3 6.1 - 8.1 g/dL   Albumin 3.8 3.6 - 5.1 g/dL   Globulin 2.5 1.9 - 3.7 g/dL (calc)   AG Ratio 1.5 1.0 - 2.5 (calc)   Total Bilirubin 0.4 0.2 - 1.2 mg/dL   Alkaline phosphatase (APISO) 78 37 - 153 U/L   AST 21 10 - 35 U/L   ALT 22 6 - 29 U/L  Hemoglobin A1c     Status: Abnormal   Collection  Time: 07/28/18  8:02 AM  Result Value Ref Range   Hgb A1c MFr Bld 5.8 (H) <5.7 % of total Hgb    Comment: For someone without known diabetes, a hemoglobin  A1c value between 5.7% and 6.4% is consistent with prediabetes and should be confirmed with a  follow-up test. . For someone with known diabetes, a value <7% indicates that their diabetes is well controlled. A1c targets should be individualized based on duration of diabetes, age, comorbid conditions, and other considerations. . This assay result is consistent with an increased risk of diabetes. . Currently, no consensus exists regarding use of hemoglobin A1c for diagnosis of diabetes for children. .    Mean Plasma Glucose 120 (calc)   eAG  (mmol/L) 6.6 (calc)    -------------------------------------------------------------------------- A&P:  Problem List Items Addressed This Visit    Centrilobular emphysema (HCC)    Stable chronic COPD mild emphysema without exacerbation today - Former smoker. No prior PFTs or maintenance therapy. - No recent exac >6 months - Reviewed course if any worsening respiratory status or more frequent exac may need formal PFTs further eval and consider maintenance therapy      Elevated hemoglobin A1c    Mild elevated A1c to 5.8, now concern PreDM Improved lifestyle and wt loss however reassuring  Plan:  1. Not on any therapy currently  2. Encourage improved lifestyle - low carb, low sugar diet, reduce portion size, continue improving regular exercise 3. Follow-up a1c 6 months       Hyperlipidemia    Significantly improved on statin Last lipid panel 07/2018 Calculated ASCVD 10 yr risk score 10%  Plan: 1. Continue current meds - Rosuvastatin 10mg  daily 2. Encourage improved lifestyle - low carb/cholesterol, reduce portion size, continue improving regular exercise      Major depression, recurrent, full remission ([place]) - Primary    In remission, controlled on SNRI Prior family stressors Seconary insomnia, chronic - improved Prior Meds: Sertraline 25mg , Escitalopram 20mg , Ambien, Trazodone 50mg  - No prior Psych / counseling, has church support and friends  Plan: 1. Continue Venlafaxine 37.5mg  daily current dose now - Off Ambien - Offered counseling Follow-up         No orders of the defined types were placed in this encounter.   Follow-up: - Return in 6 months for follow-up PreDM A1c, Mood/PHQ, Insomnia  Patient verbalizes understanding with the above medical recommendations including the limitation of remote medical advice.  Specific follow-up and call-back criteria were given for patient to follow-up or seek medical care more urgently if needed.   - Time spent in  direct consultation with patient on phone: 15 minutes  Kelly Weber, [facility] 08/04/2018, 1:41 PM

## 2018-08-04 NOTE — Patient Instructions (Addendum)
Thank you for coming to the office today.  Recent Labs    01/25/18 0811 05/29/18 1125 07/28/18 0802  HGBA1C 5.6 5.6 5.8*     Please schedule a Follow-up Appointment to: Return in about 6 months (around 02/03/2019) for 6 months PreDM A1c, Mood / Insomnia.  If you have any other questions or concerns, please feel free to call the office or send a message through [facility]. You may also schedule an earlier appointment if necessary.  Additionally, you may be receiving a survey about your experience at our office within a few days to 1 week by e-mail or mail. We value your feedback.  Nobie Putnam, DO [place]

## 2018-08-05 NOTE — Assessment & Plan Note (Signed)
Stable chronic COPD mild emphysema without exacerbation today - Former smoker. No prior PFTs or maintenance therapy. - No recent exac >6 months - Reviewed course if any worsening respiratory status or more frequent exac may need formal PFTs further eval and consider maintenance therapy 

## 2018-08-05 NOTE — Assessment & Plan Note (Signed)
In remission, controlled on SNRI Prior family stressors Seconary insomnia, chronic - improved Prior Meds: Sertraline 25mg , Escitalopram 20mg , Ambien, Trazodone 50mg  - No prior Psych / counseling, has church support and friends  Plan: 1. Continue Venlafaxine 37.5mg  daily current dose now - Off Ambien - Offered counseling Follow-up

## 2018-08-05 NOTE — Assessment & Plan Note (Signed)
Significantly improved on statin Last lipid panel 07/2018 Calculated ASCVD 10 yr risk score 10%  Plan: 1. Continue current meds - Rosuvastatin 10mg  daily 2. Encourage improved lifestyle - low carb/cholesterol, reduce portion size, continue improving regular exercise

## 2018-08-05 NOTE — Assessment & Plan Note (Signed)
Mild elevated A1c to 5.8, now concern PreDM Improved lifestyle and wt loss however reassuring  Plan:  1. Not on any therapy currently  2. Encourage improved lifestyle - low carb, low sugar diet, reduce portion size, continue improving regular exercise 3. Follow-up a1c 6 months

## 2018-08-08 ENCOUNTER — Other Ambulatory Visit: Payer: Self-pay | Admitting: Family Medicine

## 2018-08-08 ENCOUNTER — Other Ambulatory Visit: Payer: Self-pay

## 2018-08-08 ENCOUNTER — Other Ambulatory Visit: Payer: Self-pay | Admitting: Student

## 2018-08-08 DIAGNOSIS — Z1231 Encounter for screening mammogram for malignant neoplasm of breast: Secondary | ICD-10-CM

## 2018-08-17 ENCOUNTER — Ambulatory Visit (INDEPENDENT_AMBULATORY_CARE_PROVIDER_SITE_OTHER): Payer: Medicare HMO | Admitting: Family Medicine

## 2018-08-17 ENCOUNTER — Other Ambulatory Visit: Payer: Self-pay

## 2018-08-17 ENCOUNTER — Encounter (INDEPENDENT_AMBULATORY_CARE_PROVIDER_SITE_OTHER): Payer: Self-pay | Admitting: Family Medicine

## 2018-08-17 DIAGNOSIS — Z6835 Body mass index (BMI) 35.0-35.9, adult: Secondary | ICD-10-CM | POA: Diagnosis not present

## 2018-08-17 DIAGNOSIS — R7303 Prediabetes: Secondary | ICD-10-CM

## 2018-08-17 MED ORDER — METFORMIN HCL 500 MG PO TABS: 500.0000 mg | ORAL_TABLET | Freq: Every day | ORAL | 0 refills | Status: DC

## 2018-08-21 NOTE — Progress Notes (Signed)
Office: [phone]  /  Fax: [phone] TeleHealth Visit:  Kelly Weber has verbally consented to this TeleHealth visit today. The patient is located at home, the provider is located at the [facility] office. The participants in this visit include the listed provider and patient. The visit was conducted today via Skype.  HPI:   Chief Complaint: OBESITY Kelly Weber is here to discuss her progress with her obesity treatment plan. She is on the Category 2 plan and is following her eating plan approximately 100% of the time. She states she is exercising 0 minutes 0 times per week. Kelly Weber feels she has done well maintaining her weight on the Category 2 plan since her last visit. She is very bored while in COVID-19 isolation and is not very active. She is making meals with higher calories and using higher fat substitutions on her plan.  We were unable to weigh the patient today for this TeleHealth visit. She feels as if she has maintained her weight since her last visit. She has lost 6 lbs since starting treatment with [place].  Pre-Diabetes Kelly Weber has a diagnosis of prediabetes based on her elevated Hgb A1c and was informed this puts her at greater risk of developing diabetes. Kelly Weber's A1c is high even with working on her diet. She states she is not eating much sweets but is still snacking on simple carbs. She is not taking metformin currently and continues to work on diet and exercise to decrease risk of diabetes.   ASSESSMENT AND PLAN:  Prediabetes - Plan: metFORMIN (GLUCOPHAGE) 500 MG tablet  Class 2 severe obesity with serious comorbidity and body mass index (BMI) of 35.0 to 35.9 in adult, unspecified obesity type Kelly Weber)  PLAN:  Pre-Diabetes Kelly Weber will continue to work on weight loss, exercise, and decreasing simple carbohydrates in her diet to help decrease the risk of diabetes. We dicussed metformin including benefits and risks. She was informed that eating too many simple carbohydrates  or too many calories at one sitting increases the likelihood of GI side effects. Mikia will start metformin 500 mg QAM with breakfast and will get back to her diet prescription.  Obesity Kelly Weber is currently in the action stage of change. As such, her goal is to continue with weight loss efforts. She has agreed to follow the Category 2 plan. Kelly Weber has been instructed to work up to a goal of 150 minutes of combined cardio and strengthening exercise per week for weight loss and overall health benefits. We discussed the following Behavioral Modification Strategies today: keeping healthy foods in the home, ways to avoid boredom eating, better snacking choices, and emotional eating strategies.  Kelly Weber has agreed to follow-up with our clinic in 2 weeks. She was informed of the importance of frequent follow-up visits to maximize her success with intensive lifestyle modifications for her multiple health conditions.  ALLERGIES: Allergies  Allergen Reactions  . Penicillins Other (See Comments)    MEDICATIONS: Current Outpatient Medications on File Prior to Visit  Medication Sig Dispense Refill  . Calcium Carb-Cholecalciferol (GNP CALCIUM 600 +D3 PO) Take by mouth. Reported on 08/26/2015    . fluticasone (FLONASE) 50 MCG/ACT nasal spray PLACE 2 SPRAYS INTO BOTH NOSTRILS DAILY. USE FOR 4-6 WEEKS THEN STOP AND USE SEASONALLY OR AS NEEDED 48 g 1  . loperamide (IMODIUM) 2 MG capsule Take 2 mg by mouth as needed for diarrhea or loose stools.    . Multiple Vitamin (MULTI-VITAMINS) TABS Take by mouth.    . rosuvastatin (  CRESTOR) 10 MG tablet Take 1 tablet (10 mg total) by mouth at bedtime. 90 tablet 1  . Skin Protectants, Misc. (EUCERIN) cream Apply topically as needed for dry skin.    . valACYclovir (VALTREX) 1000 MG tablet     . venlafaxine XR (EFFEXOR-XR) 37.5 MG 24 hr capsule Take 1 capsule (37.5 mg total) by mouth daily with breakfast. 90 capsule 1  . Vitamin D, Ergocalciferol, (DRISDOL) 1.25 MG (50000 UT)  CAPS capsule Take 1 capsule (50,000 Units total) by mouth every 7 (seven) days. 4 capsule 0   No current facility-administered medications on file prior to visit.     PAST MEDICAL HISTORY: Past Medical History:  Diagnosis Date  . Allergies   . Anxiety   . Arthritis    fingers, left knee  . Depression   . Diverticulitis   . Dyspnea   . Fatigue   . GERD (gastroesophageal reflux disease)   . HLD (hyperlipidemia)   . Insomnia 2006  . Lower back pain   . Skin cancer 2007   Basal Cell CA resected from Left middle finger    PAST SURGICAL HISTORY: Past Surgical History:  Procedure Laterality Date  . BREAST CYST ASPIRATION    . COLONOSCOPY WITH PROPOFOL N/A 11/20/2014   Procedure: COLONOSCOPY WITH PROPOFOL;  Surgeon: Lucilla Lame, MD;  Location: [place];  Service: Endoscopy;  Laterality: N/A;  . ESOPHAGOGASTRODUODENOSCOPY (EGD) WITH PROPOFOL N/A 11/20/2014   Procedure: ESOPHAGOGASTRODUODENOSCOPY (EGD) WITH PROPOFOL;  Surgeon: Lucilla Lame, MD;  Location: [place];  Service: Endoscopy;  Laterality: N/A;  . POLYPECTOMY  11/20/2014   Procedure: POLYPECTOMY;  Surgeon: Lucilla Lame, MD;  Location: [place];  Service: Endoscopy;;  . SKIN CANCER EXCISION Left    finger  . TUBAL LIGATION  1970    SOCIAL HISTORY: Social History   Tobacco Use  . Smoking status: Former Smoker    Packs/day: 1.00    Years: 30.00    Pack years: 30.00    Types: Cigarettes    Last attempt to quit: 01/19/2014    Years since quitting: 4.5  . Smokeless tobacco: Former Network engineer Use Topics  . Alcohol use: No    Alcohol/week: 0.0 standard drinks  . Drug use: No    FAMILY HISTORY: Family History  Problem Relation Age of Onset  . Brain cancer Mother   . Thyroid disease Mother   . Hypertension Mother   . Breast cancer Maternal Aunt 26  . Breast cancer Cousin 35   ROS: ROS none noted.  PHYSICAL EXAM: Pt in no acute distress  RECENT LABS AND TESTS: BMET     Component Value Date/Time   NA 141 07/28/2018 0802   NA 141 05/29/2018 1125   NA 141 02/14/2014 0818   K 4.3 07/28/2018 0802   K 4.2 02/14/2014 0818   CL 104 07/28/2018 0802   CL 107 02/14/2014 0818   CO2 30 07/28/2018 0802   CO2 29 02/14/2014 0818   GLUCOSE 91 07/28/2018 0802   GLUCOSE 88 02/14/2014 0818   BUN 19 07/28/2018 0802   BUN 12 05/29/2018 1125   BUN 13 02/14/2014 0818   CREATININE 0.86 07/28/2018 0802   CALCIUM 8.9 07/28/2018 0802   CALCIUM 8.1 (L) 02/14/2014 0818   GFRNONAA 67 07/28/2018 0802   GFRAA 78 07/28/2018 0802   Lab Results  Component Value Date   HGBA1C 5.8 (H) 07/28/2018   HGBA1C 5.6 05/29/2018   HGBA1C 5.6 01/25/2018   HGBA1C 5.4  12/17/2016   Lab Results  Component Value Date   INSULIN 5.9 05/29/2018   CBC    Component Value Date/Time   WBC 6.6 05/29/2018 1125   WBC 5.5 01/25/2018 0811   RBC 4.42 05/29/2018 1125   RBC 4.44 01/25/2018 0811   HGB 11.9 05/29/2018 1125   HCT 37.4 05/29/2018 1125   PLT 304 01/25/2018 0811   PLT 350 11/21/2015 0842   MCV 85 05/29/2018 1125   MCV 87 02/14/2014 0818   MCH 26.9 05/29/2018 1125   MCH 27.3 01/25/2018 0811   MCHC 31.8 05/29/2018 1125   MCHC 32.7 01/25/2018 0811   RDW 14.5 05/29/2018 1125   RDW 14.4 02/14/2014 0818   LYMPHSABS 2.4 05/29/2018 1125   LYMPHSABS 2.1 02/14/2014 0818   MONOABS 455 09/29/2015 1656   MONOABS 0.4 02/14/2014 0818   EOSABS 0.1 05/29/2018 1125   EOSABS 0.1 02/14/2014 0818   BASOSABS 0.0 05/29/2018 1125   BASOSABS 0.0 02/14/2014 0818   Iron/TIBC/Ferritin/ %Sat    Component Value Date/Time   IRON 18 (L) 08/27/2015 0955   TIBC 362 08/27/2015 0955   FERRITIN 17 08/27/2015 0955   IRONPCTSAT 5 (LL) 08/27/2015 0955   Lipid Panel     Component Value Date/Time   CHOL 167 07/28/2018 0802   CHOL 133 05/29/2018 1125   CHOL 188 02/14/2014 0818   TRIG 146 07/28/2018 0802   TRIG 101 02/14/2014 0818   HDL 50 07/28/2018 0802   HDL 60 05/29/2018 1125   HDL 55 02/14/2014  0818   CHOLHDL 3.3 07/28/2018 0802   VLDL 20 02/14/2014 0818   LDLCALC 92 07/28/2018 0802   LDLCALC 113 (H) 02/14/2014 0818   Hepatic Function Panel     Component Value Date/Time   PROT 6.3 07/28/2018 0802   PROT 6.5 05/29/2018 1125   PROT 6.4 02/14/2014 0818   ALBUMIN 4.1 05/29/2018 1125   ALBUMIN 3.3 (L) 02/14/2014 0818   AST 21 07/28/2018 0802   AST 20 02/14/2014 0818   ALT 22 07/28/2018 0802   ALT 24 02/14/2014 0818   ALKPHOS 85 05/29/2018 1125   ALKPHOS 92 02/14/2014 0818   BILITOT 0.4 07/28/2018 0802   BILITOT 0.5 05/29/2018 1125   BILITOT 0.5 02/14/2014 0818      Component Value Date/Time   TSH 2.230 05/29/2018 1125   TSH 1.50 01/25/2018 0811   TSH 2.270 08/27/2015 0955   Results for Kelly Weber, Kelly Weber (MRN [id number]) as of 08/21/2018 11:15  Ref. Range 05/29/2018 11:25  Vitamin D, 25-Hydroxy Latest Ref Range: 30.0 - 100.0 ng/mL 34.8    I, Kelly Weber, am acting as Location manager for Dennard Nip, MD I have reviewed the above documentation for accuracy and completeness, and I agree with the above. -Dennard Nip, MD

## 2018-08-22 ENCOUNTER — Other Ambulatory Visit (INDEPENDENT_AMBULATORY_CARE_PROVIDER_SITE_OTHER): Payer: Self-pay | Admitting: Bariatrics

## 2018-08-22 DIAGNOSIS — E559 Vitamin D deficiency, unspecified: Secondary | ICD-10-CM

## 2018-08-31 ENCOUNTER — Emergency Department
Admission: EM | Admit: 2018-08-31 | Discharge: 2018-08-31 | Disposition: A | Discharge status: Home / Self Care | Payer: Medicare HMO | Attending: Student in an Organized Health Care Education/Training Program | Admitting: Student in an Organized Health Care Education/Training Program

## 2018-08-31 ENCOUNTER — Other Ambulatory Visit: Payer: Self-pay

## 2018-08-31 ENCOUNTER — Emergency Department: Payer: Medicare HMO

## 2018-08-31 ENCOUNTER — Encounter: Payer: Self-pay | Admitting: Emergency Medicine

## 2018-08-31 DIAGNOSIS — Z87891 Personal history of nicotine dependence: Secondary | ICD-10-CM | POA: Insufficient documentation

## 2018-08-31 DIAGNOSIS — R42 Dizziness and giddiness: Secondary | ICD-10-CM | POA: Insufficient documentation

## 2018-08-31 DIAGNOSIS — Z79899 Other long term (current) drug therapy: Secondary | ICD-10-CM | POA: Insufficient documentation

## 2018-08-31 LAB — URINALYSIS, COMPLETE (UACMP) WITH MICROSCOPIC
Bacteria, UA: NONE SEEN
Bilirubin Urine: NEGATIVE
Glucose, UA: NEGATIVE mg/dL
Ketones, ur: NEGATIVE mg/dL
Nitrite: NEGATIVE
Protein, ur: NEGATIVE mg/dL
Specific Gravity, Urine: 1.018 (ref 1.005–1.030)
Squamous Epithelial / HPF: NONE SEEN (ref 0–5)
pH: 5 (ref 5.0–8.0)

## 2018-08-31 LAB — BASIC METABOLIC PANEL
Anion gap: 10 (ref 5–15)
BUN: 20 mg/dL (ref 8–23)
CO2: 25 mmol/L (ref 22–32)
Calcium: 8.6 mg/dL — ABNORMAL LOW (ref 8.9–10.3)
Chloride: 102 mmol/L (ref 98–111)
Creatinine, Ser: 0.85 mg/dL (ref 0.44–1.00)
GFR calc Af Amer: >60 mL/min (ref >60)
GFR calc non Af Amer: >60 mL/min (ref >60)
Glucose, Bld: 110 mg/dL — ABNORMAL HIGH (ref 70–99)
Potassium: 3.8 mmol/L (ref 3.5–5.1)
Sodium: 137 mmol/L (ref 135–145)

## 2018-08-31 LAB — CBC
HCT: 38.4 % (ref 36.0–46.0)
Hemoglobin: 12.1 g/dL (ref 12.0–15.0)
MCH: 27.4 pg (ref 26.0–34.0)
MCHC: 31.5 g/dL (ref 30.0–36.0)
MCV: 87.1 fL (ref 80.0–100.0)
Platelets: 276 10*3/uL (ref 150–400)
RBC: 4.41 MIL/uL (ref 3.87–5.11)
RDW: 14.6 % (ref 11.5–15.5)
WBC: 7.6 10*3/uL (ref 4.0–10.5)
nRBC: 0 % (ref 0.0–0.2)

## 2018-08-31 MED ORDER — MECLIZINE HCL 25 MG PO TABS: 25.0000 mg | ORAL_TABLET | Freq: Three times a day (TID) | ORAL | 0 refills | Status: DC | PRN

## 2018-08-31 MED ORDER — MECLIZINE HCL 25 MG PO TABS
12.5000 mg | ORAL_TABLET | Freq: Once | ORAL | Status: AC
  Administered 2018-08-31: 17:00:00 12.5 mg via ORAL
  Filled 2018-08-31: qty 1

## 2018-08-31 NOTE — ED Notes (Signed)
Provided pt with sandwich tray and water

## 2018-08-31 NOTE — ED Triage Notes (Signed)
Pt reports was walking on a treadmill yesterday and started feeling dizzy. Pt states that she has been dizzy all day today. Pt denies pain or SOB.

## 2018-08-31 NOTE — ED Notes (Signed)
Pt assisted back to the bed from the toilet. Pt A&Ox4.

## 2018-08-31 NOTE — ED Provider Notes (Signed)
[facility] Emergency Department Provider Note    First MD Initiated Contact with Patient 08/31/18 1504     (approximate)  I have reviewed the triage vital signs and the nursing notes.   HISTORY  Chief Complaint Dizziness and Nausea    HPI Kelly Weber is a 73 y.o. female below listed past medical history presents the ER for over 24 hours of dizziness and spinning sensation that occurred after the patient was working on a treadmill yesterday evening.  States is worse when she stands up movement.  Denies any numbness or tingling.  No chest pain or shortness of breath.  No nausea or vomiting or diarrhea.  She is currently on a diet for being prediabetic.  States when she is at rest her symptoms improve but anytime she sits up or moves around she does have feeling of spinning around.  Is never had history of vertigo or similar symptoms previously.  She does not smoke.  No history of TIA.  No history of dysrhythmia or heart disease.    Past Medical History:  Diagnosis Date  . Allergies   . Anxiety   . Arthritis    fingers, left knee  . Depression   . Diverticulitis   . Dyspnea   . Fatigue   . GERD (gastroesophageal reflux disease)   . HLD (hyperlipidemia)   . Insomnia 2006  . Lower back pain   . Skin cancer 2007   Basal Cell CA resected from Left middle finger   Family History  Problem Relation Age of Onset  . Brain cancer Mother   . Thyroid disease Mother   . Hypertension Mother   . Breast cancer Maternal Aunt 26  . Breast cancer Cousin 35   Past Surgical History:  Procedure Laterality Date  . BREAST CYST ASPIRATION    . COLONOSCOPY WITH PROPOFOL N/A 11/20/2014   Procedure: COLONOSCOPY WITH PROPOFOL;  Surgeon: Lucilla Lame, MD;  Location: [place];  Service: Endoscopy;  Laterality: N/A;  . ESOPHAGOGASTRODUODENOSCOPY (EGD) WITH PROPOFOL N/A 11/20/2014   Procedure: ESOPHAGOGASTRODUODENOSCOPY (EGD) WITH PROPOFOL;  Surgeon: Lucilla Lame,  MD;  Location: [place];  Service: Endoscopy;  Laterality: N/A;  . POLYPECTOMY  11/20/2014   Procedure: POLYPECTOMY;  Surgeon: Lucilla Lame, MD;  Location: [place];  Service: Endoscopy;;  . SKIN CANCER EXCISION Left    finger  . TUBAL LIGATION  1970   Patient Active Problem List   Diagnosis Date Noted  . Elevated hemoglobin A1c 01/29/2018  . Hyperlipidemia 09/02/2016  . GERD (gastroesophageal reflux disease) 09/01/2016  . Chronic diarrhea 02/09/2016  . Anemia 09/29/2015  . Obesity (BMI 35.0-39.9 without comorbidity) 08/27/2015  . Centrilobular emphysema ([place]) 08/27/2015  . Syncope and collapse 08/26/2015  . SOB (shortness of breath) on exertion 08/26/2015  . Pityriasis rosea 03/12/2015  . Seasonal allergies 02/27/2015  . Hx of colonic polyps   . Benign neoplasm of descending colon   . Irritable bowel syndrome (IBS)   . Hiatal hernia   . Gonalgia 09/23/2014  . Arthritis 09/23/2014  . Major depression, recurrent, full remission ([place]) 09/23/2014  . Dysesthesia 09/23/2014  . Insomnia due to medical condition 09/23/2014  . Acne 09/23/2014  . H/O malignant neoplasm of skin 09/23/2014  . Compulsive tobacco user syndrome 09/23/2014  . Hematuria, microscopic 09/23/2014  . Seborrheic keratoses, inflamed 09/23/2014      Prior to Admission medications   Medication Sig Start Date End Date Taking? Authorizing Provider  Calcium Carb-Cholecalciferol (  GNP CALCIUM 600 +D3) 600-800 MG-UNIT TABS Take 1 tablet by mouth daily.    Yes [provider]  fluticasone (FLONASE) 50 MCG/ACT nasal spray PLACE 2 SPRAYS INTO BOTH NOSTRILS DAILY. USE FOR 4-6 WEEKS THEN STOP AND USE SEASONALLY OR AS NEEDED 07/06/18  Yes Karamalegos, Devonne Doughty, DO  loperamide (IMODIUM) 2 MG capsule Take 2 mg by mouth as needed for diarrhea or loose stools.   Yes [provider]  metFORMIN (GLUCOPHAGE) 500 MG tablet Take 1 tablet (500 mg total) by mouth daily with breakfast. 08/17/18  Yes  Dennard Nip D, MD  Multiple Vitamin (MULTI-VITAMINS) TABS Take 1 tablet by mouth daily.    Yes [provider]  Skin Protectants, Misc. (EUCERIN) cream Apply topically as needed for dry skin.   Yes [provider]  valACYclovir (VALTREX) 1000 MG tablet Take 2,000 mg by mouth 2 (two) times daily as needed (fever blisters / rash).    Yes [provider]  venlafaxine XR (EFFEXOR-XR) 37.5 MG 24 hr capsule Take 1 capsule (37.5 mg total) by mouth daily with breakfast. 07/20/18  Yes Karamalegos, Devonne Doughty, DO  Vitamin D, Ergocalciferol, (DRISDOL) 1.25 MG (50000 UT) CAPS capsule Take 1 capsule (50,000 Units total) by mouth every 7 (seven) days. 07/31/18  Yes Jearld Lesch A, DO  meclizine (ANTIVERT) 25 MG tablet Take 1 tablet (25 mg total) by mouth 3 (three) times daily as needed for dizziness. 08/31/18   Merlyn Lot, MD  rosuvastatin (CRESTOR) 10 MG tablet Take 1 tablet (10 mg total) by mouth at bedtime. Patient not taking: Reported on 08/31/2018 02/01/18   Olin Hauser, DO    Allergies Penicillins    Social History Social History   Tobacco Use  . Smoking status: Former Smoker    Packs/day: 1.00    Years: 30.00    Pack years: 30.00    Types: Cigarettes    Last attempt to quit: 01/19/2014    Years since quitting: 4.6  . Smokeless tobacco: Former Network engineer Use Topics  . Alcohol use: No    Alcohol/week: 0.0 standard drinks  . Drug use: No    Review of Systems Patient denies headaches, rhinorrhea, blurry vision, numbness, shortness of breath, chest pain, edema, cough, abdominal pain, nausea, vomiting, diarrhea, dysuria, fevers, rashes or hallucinations unless otherwise stated above in HPI. ____________________________________________   PHYSICAL EXAM:  VITAL SIGNS: Vitals:   08/31/18 1730 08/31/18 1745  BP: 136/86   Pulse: 72 76  Resp: 16   Temp:    SpO2: 98% 98%    Constitutional: Alert and oriented.  Eyes: Conjunctivae are  normal.  Head: Atraumatic. Nose: No congestion/rhinnorhea. Mouth/Throat: Mucous membranes are moist.   Neck: No stridor. Painless ROM.  Cardiovascular: Normal rate, regular rhythm. Grossly normal heart sounds.  Good peripheral circulation. Respiratory: Normal respiratory effort.  No retractions. Lungs CTAB. Gastrointestinal: Soft and nontender. No distention. No abdominal bruits. No CVA tenderness. Genitourinary:  Musculoskeletal: No lower extremity tenderness nor edema.  No joint effusions. Neurologic:  CN- intact.  No facial droop, Normal FNF.  Normal heel to shin.  Sensation intact bilaterally. Normal speech and language. No gross focal neurologic deficits are appreciated. No gait instability.  HINTS exam benign. Skin:  Skin is warm, dry and intact. No rash noted. Psychiatric: Mood and affect are normal. Speech and behavior are normal.  ____________________________________________   LABS (all labs ordered are listed, but only abnormal results are displayed)  Results for orders placed or performed during the  hospital encounter of 08/31/18 (from the past 24 hour(s))  Basic metabolic panel     Status: Abnormal   Collection Time: 08/31/18  1:46 PM  Result Value Ref Range   Sodium 137 135 - 145 mmol/L   Potassium 3.8 3.5 - 5.1 mmol/L   Chloride 102 98 - 111 mmol/L   CO2 25 22 - 32 mmol/L   Glucose, Bld 110 (H) 70 - 99 mg/dL   BUN 20 8 - 23 mg/dL   Creatinine, Ser 0.85 0.44 - 1.00 mg/dL   Calcium 8.6 (L) 8.9 - 10.3 mg/dL   GFR calc non Af Amer >60 >60 mL/min   GFR calc Af Amer >60 >60 mL/min   Anion gap 10 5 - 15  CBC     Status: None   Collection Time: 08/31/18  1:46 PM  Result Value Ref Range   WBC 7.6 4.0 - 10.5 K/uL   RBC 4.41 3.87 - 5.11 MIL/uL   Hemoglobin 12.1 12.0 - 15.0 g/dL   HCT 38.4 36.0 - 46.0 %   MCV 87.1 80.0 - 100.0 fL   MCH 27.4 26.0 - 34.0 pg   MCHC 31.5 30.0 - 36.0 g/dL   RDW 14.6 11.5 - 15.5 %   Platelets 276 150 - 400 K/uL   nRBC 0.0 0.0 - 0.2 %   Urinalysis, Complete w Microscopic     Status: Abnormal   Collection Time: 08/31/18  1:46 PM  Result Value Ref Range   Color, Urine YELLOW (A) YELLOW   APPearance HAZY (A) CLEAR   Specific Gravity, Urine 1.018 1.005 - 1.030   pH 5.0 5.0 - 8.0   Glucose, UA NEGATIVE NEGATIVE mg/dL   Hgb urine dipstick MODERATE (A) NEGATIVE   Bilirubin Urine NEGATIVE NEGATIVE   Ketones, ur NEGATIVE NEGATIVE mg/dL   Protein, ur NEGATIVE NEGATIVE mg/dL   Nitrite NEGATIVE NEGATIVE   Leukocytes,Ua SMALL (A) NEGATIVE   RBC / HPF 0-5 0 - 5 RBC/hpf   WBC, UA 11-20 0 - 5 WBC/hpf   Bacteria, UA NONE SEEN NONE SEEN   Squamous Epithelial / LPF NONE SEEN 0 - 5   Mucus PRESENT    ____________________________________________  EKG My review and personal interpretation at Time: 13:49   Indication: dizziness  Rate: 70  Rhythm: sinus Axis: normal Other: normal intervals no stemi ____________________________________________  RADIOLOGY  I personally reviewed all radiographic images ordered to evaluate for the above acute complaints and reviewed radiology reports and findings.  These findings were personally discussed with the patient.  Please see medical record for radiology report.   ____________________________________________   PROCEDURES  Procedure(s) performed:  Procedures    Critical Care performed: no ____________________________________________   INITIAL IMPRESSION / ASSESSMENT AND PLAN / ED COURSE  Pertinent labs & imaging results that were available during my care of the patient were reviewed by me and considered in my medical decision making (see chart for details).   DDX: vertigo, tia, cva, mass, electrolyte abn, anemia, dehydration, orthostasis  LAKEYTA VANDENHEUVEL is a 73 y.o. who presents to the ED with positional dizziness.  She is afebrile hemodynamically stable.  Neuro exam is reassuring.  Benign hints exam but given her age and risk factors will order MRI to evaluate for central lesion.   No focal neuro deficits at this time.  EKG shows no evidence of dysrhythmia or ischemia.  She is not describing any symptoms of ACS or infectious process.  No hypoxia.  The patient will be placed on  continuous pulse oximetry and telemetry for monitoring.  Laboratory evaluation will be sent to evaluate for the above complaints.     Clinical Course as of Aug 31 1755  Thu Aug 31, 2018  1745 Patient with improvement in symptoms after meclizine.  MRI shows no evidence of acute infarct.  She not have any features to suggest CVA or certainly no evidence of mass.  She is able to ambulate with steady gait.  This point I believe she is stable and appropriate for discharge home.   [PR]    Clinical Course User Index [PR] Merlyn Lot, MD    The patient was evaluated in Emergency Department today for the symptoms described in the history of present illness. He/she was evaluated in the context of the global COVID-19 pandemic, which necessitated consideration that the patient might be at risk for infection with the SARS-CoV-2 virus that causes COVID-19. Institutional protocols and algorithms that pertain to the evaluation of patients at risk for COVID-19 are in a state of rapid change based on information released by regulatory bodies including the CDC and federal and state organizations. These policies and algorithms were followed during the patient's care in the ED.  As part of my medical decision making, I reviewed the following data within the [place] notes reviewed and incorporated, Labs reviewed, notes from prior ED visits and [place] Controlled Substance Database   ____________________________________________   FINAL CLINICAL IMPRESSION(S) / ED DIAGNOSES  Final diagnoses:  Dizziness      NEW MEDICATIONS STARTED DURING THIS VISIT:  New Prescriptions   MECLIZINE (ANTIVERT) 25 MG TABLET    Take 1 tablet (25 mg total) by mouth 3 (three) times daily as needed for  dizziness.     Note:  This document was prepared using Dragon voice recognition software and may include unintentional dictation errors.    Merlyn Lot, MD 08/31/18 1757

## 2018-08-31 NOTE — ED Notes (Signed)
Pt oob in room with assist. Pt states she feels better but is still a little dizzy. edp notified.

## 2018-08-31 NOTE — ED Notes (Signed)
Pt off the floor for MRI

## 2018-09-02 LAB — URINE CULTURE: Culture: <10000 — AB

## 2018-09-05 ENCOUNTER — Other Ambulatory Visit: Payer: Self-pay

## 2018-09-05 ENCOUNTER — Encounter (INDEPENDENT_AMBULATORY_CARE_PROVIDER_SITE_OTHER): Payer: Self-pay | Admitting: Family Medicine

## 2018-09-05 ENCOUNTER — Ambulatory Visit (INDEPENDENT_AMBULATORY_CARE_PROVIDER_SITE_OTHER): Payer: Medicare HMO | Admitting: Family Medicine

## 2018-09-05 DIAGNOSIS — R7303 Prediabetes: Secondary | ICD-10-CM

## 2018-09-05 DIAGNOSIS — Z6835 Body mass index (BMI) 35.0-35.9, adult: Secondary | ICD-10-CM | POA: Diagnosis not present

## 2018-09-05 DIAGNOSIS — E559 Vitamin D deficiency, unspecified: Secondary | ICD-10-CM | POA: Diagnosis not present

## 2018-09-05 MED ORDER — VITAMIN D (ERGOCALCIFEROL) 1.25 MG (50000 UNIT) PO CAPS: 50000.0000 [IU] | ORAL_CAPSULE | ORAL | 0 refills | Status: DC

## 2018-09-05 MED ORDER — METFORMIN HCL 500 MG PO TABS: 500.0000 mg | ORAL_TABLET | Freq: Two times a day (BID) | ORAL | 0 refills | Status: DC

## 2018-09-06 ENCOUNTER — Telehealth: Payer: Self-pay

## 2018-09-06 ENCOUNTER — Other Ambulatory Visit: Payer: Self-pay

## 2018-09-06 DIAGNOSIS — R42 Dizziness and giddiness: Secondary | ICD-10-CM

## 2018-09-06 MED ORDER — MECLIZINE HCL 25 MG PO TABS: 25.0000 mg | ORAL_TABLET | Freq: Three times a day (TID) | ORAL | 1 refills | Status: DC | PRN

## 2018-09-06 NOTE — Progress Notes (Signed)
Office: [phone]  /  Fax: [phone] TeleHealth Visit:  Kelly Weber has verbally consented to this TeleHealth visit today. The patient is located at home, the provider is located at the [employer] [facility] office. The participants in this visit include the listed provider and patient and any and all parties involved. The visit was conducted today via telephone. Kelly Weber was unable to use realtime audiovisual technology today and the telehealth visit was conducted via telephone.  HPI:   Chief Complaint: OBESITY Kelly Weber is here to discuss her progress with her obesity treatment plan. She is on the Category 2 plan and is following her eating plan approximately 90 to 95 % of the time. She states she is riding her bike, walking on the treadmill and doing floor exercises for 20 to 25 minutes 5 times per week. Kelly Weber feels she has done well with maintaining her weight. She has been trying to eat all of her food, but she has increased snacking at night. Kelly Weber has tried to do some walking, but she has been having vertigo the last few days. She went to the ED, where CVA and tumor was ruled out. We were unable to weigh the patient today for this TeleHealth visit. She feels as if she has maintained weight since her last visit. She has lost 6 lbs since starting treatment with [place].  Vitamin D deficiency Tearsa has a diagnosis of vitamin D deficiency. She is stable on vit D and denies nausea, vomiting or muscle weakness.  Pre-Diabetes Kelly Weber has a diagnosis of prediabetes based on her elevated Hgb A1c and was informed this puts her at greater risk of developing diabetes. She continues to work on diet and exercise to decrease risk of diabetes. She notes increased PM polyphagia, and this has been worse in the last month with increased time on her hands. She is on Metformin in the morning only. Kelly Weber denies nausea, vomiting or hypoglycemia.  ASSESSMENT AND PLAN:  Vitamin D deficiency - Plan: Vitamin D,  Ergocalciferol, (DRISDOL) 1.25 MG (50000 UT) CAPS capsule  Prediabetes - Plan: metFORMIN (GLUCOPHAGE) 500 MG tablet  Class 2 severe obesity with serious comorbidity and body mass index (BMI) of 35.0 to 35.9 in adult, unspecified obesity type ([facility])  PLAN:  Vitamin D Deficiency Kelly Weber was informed that low vitamin D levels contributes to fatigue and are associated with obesity, breast, and colon cancer. She agrees to continue to take prescription Vit D @50 ,000 IU every week #4 with no refills  and will follow up for routine testing of vitamin D, at least 2-3 times per year. She was informed of the risk of over-replacement of vitamin D and agrees to not increase her dose unless she discusses this with [place] first. Kelly Weber agrees to follow up as directed.  Pre-Diabetes Kelly Weber will continue to work on weight loss, exercise, and decreasing simple carbohydrates in her diet to help decrease the risk of diabetes. We dicussed metformin including benefits and risks. She was informed that eating too many simple carbohydrates or too many calories at one sitting increases the likelihood of GI side effects. Kelly Weber agrees to increase Metformin to 500 mg BID #60 with no refills and follow up with [place] as directed to monitor her progress.  I spent > than 50% of the 25 minute visit on counseling as documented in the note.  Obesity Kelly Weber is currently in the action stage of change. As such, her goal is to continue with weight loss efforts She has agreed to  follow the Category 2 plan Kelly Weber has been instructed to work up to a goal of 150 minutes of combined cardio and strengthening exercise per week for weight loss and overall health benefits. We discussed the following Behavioral Modification Strategies today: increasing lean protein intake, decreasing simple carbohydrates and work on meal planning and easy cooking plans Kelly Weber is to follow up with her PCP and work to treat this vertigo, so patient can get back to walking safely.   Kelly Weber has agreed to follow up with our clinic in 2 weeks. She was informed of the importance of frequent follow up visits to maximize her success with intensive lifestyle modifications for her multiple health conditions.  ALLERGIES: Allergies  Allergen Reactions  . Penicillins Hives and Swelling    MEDICATIONS: Current Outpatient Medications on File Prior to Visit  Medication Sig Dispense Refill  . Calcium Carb-Cholecalciferol (GNP CALCIUM 600 +D3) 600-800 MG-UNIT TABS Take 1 tablet by mouth daily.     . fluticasone (FLONASE) 50 MCG/ACT nasal spray PLACE 2 SPRAYS INTO BOTH NOSTRILS DAILY. USE FOR 4-6 WEEKS THEN STOP AND USE SEASONALLY OR AS NEEDED 48 g 1  . loperamide (IMODIUM) 2 MG capsule Take 2 mg by mouth as needed for diarrhea or loose stools.    . Multiple Vitamin (MULTI-VITAMINS) TABS Take 1 tablet by mouth daily.     . rosuvastatin (CRESTOR) 10 MG tablet Take 1 tablet (10 mg total) by mouth at bedtime. (Patient not taking: Reported on 08/31/2018) 90 tablet 1  . Skin Protectants, Misc. (EUCERIN) cream Apply topically as needed for dry skin.    . valACYclovir (VALTREX) 1000 MG tablet Take 2,000 mg by mouth 2 (two) times daily as needed (fever blisters / rash).     . venlafaxine XR (EFFEXOR-XR) 37.5 MG 24 hr capsule Take 1 capsule (37.5 mg total) by mouth daily with breakfast. 90 capsule 1   No current facility-administered medications on file prior to visit.     PAST MEDICAL HISTORY: Past Medical History:  Diagnosis Date  . Allergies   . Anxiety   . Arthritis    fingers, left knee  . Depression   . Diverticulitis   . Dyspnea   . Fatigue   . GERD (gastroesophageal reflux disease)   . HLD (hyperlipidemia)   . Insomnia 2006  . Lower back pain   . Skin cancer 2007   Basal Cell CA resected from Left middle finger    PAST SURGICAL HISTORY: Past Surgical History:  Procedure Laterality Date  . BREAST CYST ASPIRATION    . COLONOSCOPY WITH PROPOFOL N/A 11/20/2014   Procedure:  COLONOSCOPY WITH PROPOFOL;  Surgeon: Lucilla Lame, MD;  Location: [place];  Service: Endoscopy;  Laterality: N/A;  . ESOPHAGOGASTRODUODENOSCOPY (EGD) WITH PROPOFOL N/A 11/20/2014   Procedure: ESOPHAGOGASTRODUODENOSCOPY (EGD) WITH PROPOFOL;  Surgeon: Lucilla Lame, MD;  Location: [place];  Service: Endoscopy;  Laterality: N/A;  . POLYPECTOMY  11/20/2014   Procedure: POLYPECTOMY;  Surgeon: Lucilla Lame, MD;  Location: [place];  Service: Endoscopy;;  . SKIN CANCER EXCISION Left    finger  . TUBAL LIGATION  1970    SOCIAL HISTORY: Social History   Tobacco Use  . Smoking status: Former Smoker    Packs/day: 1.00    Years: 30.00    Pack years: 30.00    Types: Cigarettes    Last attempt to quit: 01/19/2014    Years since quitting: 4.6  . Smokeless tobacco: Former Network engineer Use Topics  .  Alcohol use: No    Alcohol/week: 0.0 standard drinks  . Drug use: No    FAMILY HISTORY: Family History  Problem Relation Age of Onset  . Brain cancer Mother   . Thyroid disease Mother   . Hypertension Mother   . Breast cancer Maternal Aunt 26  . Breast cancer Cousin 35    ROS: Review of Systems  Constitutional: Negative for weight loss.  Gastrointestinal: Negative for nausea and vomiting.  Musculoskeletal:       Negative for muscle weakness  Endo/Heme/Allergies:       Negative for hypoglycemia Positive for polyphagia    PHYSICAL EXAM: Pt in no acute distress  RECENT LABS AND TESTS: BMET    Component Value Date/Time   NA 137 08/31/2018 1346   NA 141 05/29/2018 1125   NA 141 02/14/2014 0818   K 3.8 08/31/2018 1346   K 4.2 02/14/2014 0818   CL 102 08/31/2018 1346   CL 107 02/14/2014 0818   CO2 25 08/31/2018 1346   CO2 29 02/14/2014 0818   GLUCOSE 110 (H) 08/31/2018 1346   GLUCOSE 88 02/14/2014 0818   BUN 20 08/31/2018 1346   BUN 12 05/29/2018 1125   BUN 13 02/14/2014 0818   CREATININE 0.85 08/31/2018 1346   CREATININE 0.86 07/28/2018 0802    CALCIUM 8.6 (L) 08/31/2018 1346   CALCIUM 8.1 (L) 02/14/2014 0818   GFRNONAA >60 08/31/2018 1346   GFRNONAA 67 07/28/2018 0802   GFRAA >60 08/31/2018 1346   GFRAA 78 07/28/2018 0802   Lab Results  Component Value Date   HGBA1C 5.8 (H) 07/28/2018   HGBA1C 5.6 05/29/2018   HGBA1C 5.6 01/25/2018   HGBA1C 5.4 12/17/2016   Lab Results  Component Value Date   INSULIN 5.9 05/29/2018   CBC    Component Value Date/Time   WBC 7.6 08/31/2018 1346   RBC 4.41 08/31/2018 1346   HGB 12.1 08/31/2018 1346   HGB 11.9 05/29/2018 1125   HCT 38.4 08/31/2018 1346   HCT 37.4 05/29/2018 1125   PLT 276 08/31/2018 1346   PLT 350 11/21/2015 0842   MCV 87.1 08/31/2018 1346   MCV 85 05/29/2018 1125   MCV 87 02/14/2014 0818   MCH 27.4 08/31/2018 1346   MCHC 31.5 08/31/2018 1346   RDW 14.6 08/31/2018 1346   RDW 14.5 05/29/2018 1125   RDW 14.4 02/14/2014 0818   LYMPHSABS 2.4 05/29/2018 1125   LYMPHSABS 2.1 02/14/2014 0818   MONOABS 455 09/29/2015 1656   MONOABS 0.4 02/14/2014 0818   EOSABS 0.1 05/29/2018 1125   EOSABS 0.1 02/14/2014 0818   BASOSABS 0.0 05/29/2018 1125   BASOSABS 0.0 02/14/2014 0818   Iron/TIBC/Ferritin/ %Sat    Component Value Date/Time   IRON 18 (L) 08/27/2015 0955   TIBC 362 08/27/2015 0955   FERRITIN 17 08/27/2015 0955   IRONPCTSAT 5 (LL) 08/27/2015 0955   Lipid Panel     Component Value Date/Time   CHOL 167 07/28/2018 0802   CHOL 133 05/29/2018 1125   CHOL 188 02/14/2014 0818   TRIG 146 07/28/2018 0802   TRIG 101 02/14/2014 0818   HDL 50 07/28/2018 0802   HDL 60 05/29/2018 1125   HDL 55 02/14/2014 0818   CHOLHDL 3.3 07/28/2018 0802   VLDL 20 02/14/2014 0818   LDLCALC 92 07/28/2018 0802   LDLCALC 113 (H) 02/14/2014 0818   Hepatic Function Panel     Component Value Date/Time   PROT 6.3 07/28/2018 0802   PROT 6.5 05/29/2018  1125   PROT 6.4 02/14/2014 0818   ALBUMIN 4.1 05/29/2018 1125   ALBUMIN 3.3 (L) 02/14/2014 0818   AST 21 07/28/2018 0802   AST  20 02/14/2014 0818   ALT 22 07/28/2018 0802   ALT 24 02/14/2014 0818   ALKPHOS 85 05/29/2018 1125   ALKPHOS 92 02/14/2014 0818   BILITOT 0.4 07/28/2018 0802   BILITOT 0.5 05/29/2018 1125   BILITOT 0.5 02/14/2014 0818      Component Value Date/Time   TSH 2.230 05/29/2018 1125   TSH 1.50 01/25/2018 0811   TSH 2.270 08/27/2015 0955     Ref. Range 05/29/2018 11:25  Vitamin D, 25-Hydroxy Latest Ref Range: 30.0 - 100.0 ng/mL 34.8    I, Doreene Nest, am acting as Location manager for Dennard Nip, MD I have reviewed the above documentation for accuracy and completeness, and I agree with the above. -Dennard Nip, MD

## 2018-09-06 NOTE — Addendum Note (Signed)
Addended by: Olin Hauser on: 09/06/2018 09:48 AM   Modules accepted: Orders

## 2018-09-06 NOTE — Telephone Encounter (Signed)
The pt was recently seen at the ER for dizziness . Diagnose with vertigo. Treated with Meclizine for the dizziness. Her symptoms are improving, but not resolved.  The pt is requesting a refill on the Meclizine. Please advise

## 2018-09-06 NOTE — Telephone Encounter (Signed)
The pt was notified that the prescription was sent over to her pharmacy.  °

## 2018-09-06 NOTE — Telephone Encounter (Signed)
Reviewed chart. Sent refill Meclizine to [facility]. Let me know if it needed to be changed to her other pharmacy that we also have on file.  Nobie Putnam, [facility] 09/06/2018, 9:48 AM

## 2018-09-18 ENCOUNTER — Ambulatory Visit (INDEPENDENT_AMBULATORY_CARE_PROVIDER_SITE_OTHER): Payer: Medicare HMO | Admitting: Family Medicine

## 2018-09-25 ENCOUNTER — Other Ambulatory Visit (INDEPENDENT_AMBULATORY_CARE_PROVIDER_SITE_OTHER): Payer: Self-pay | Admitting: Family Medicine

## 2018-09-25 DIAGNOSIS — E559 Vitamin D deficiency, unspecified: Secondary | ICD-10-CM

## 2018-09-26 ENCOUNTER — Other Ambulatory Visit (INDEPENDENT_AMBULATORY_CARE_PROVIDER_SITE_OTHER): Payer: Self-pay | Admitting: Family Medicine

## 2018-09-26 DIAGNOSIS — E559 Vitamin D deficiency, unspecified: Secondary | ICD-10-CM

## 2018-09-27 ENCOUNTER — Other Ambulatory Visit (INDEPENDENT_AMBULATORY_CARE_PROVIDER_SITE_OTHER): Payer: Self-pay | Admitting: Family Medicine

## 2018-09-27 DIAGNOSIS — R7303 Prediabetes: Secondary | ICD-10-CM

## 2018-09-28 ENCOUNTER — Ambulatory Visit (INDEPENDENT_AMBULATORY_CARE_PROVIDER_SITE_OTHER): Payer: Medicare HMO | Admitting: Family Medicine

## 2018-10-02 ENCOUNTER — Other Ambulatory Visit: Payer: Self-pay

## 2018-10-02 ENCOUNTER — Ambulatory Visit (INDEPENDENT_AMBULATORY_CARE_PROVIDER_SITE_OTHER): Payer: Medicare HMO | Admitting: Family Medicine

## 2018-10-02 DIAGNOSIS — Z6835 Body mass index (BMI) 35.0-35.9, adult: Secondary | ICD-10-CM | POA: Diagnosis not present

## 2018-10-02 DIAGNOSIS — R7303 Prediabetes: Secondary | ICD-10-CM

## 2018-10-02 DIAGNOSIS — E559 Vitamin D deficiency, unspecified: Secondary | ICD-10-CM | POA: Diagnosis not present

## 2018-10-03 ENCOUNTER — Telehealth (INDEPENDENT_AMBULATORY_CARE_PROVIDER_SITE_OTHER): Payer: Self-pay | Admitting: Family Medicine

## 2018-10-03 ENCOUNTER — Encounter (INDEPENDENT_AMBULATORY_CARE_PROVIDER_SITE_OTHER): Payer: Self-pay | Admitting: Family Medicine

## 2018-10-03 MED ORDER — VITAMIN D (ERGOCALCIFEROL) 1.25 MG (50000 UNIT) PO CAPS: 50000.0000 [IU] | ORAL_CAPSULE | ORAL | 0 refills | Status: DC

## 2018-10-03 NOTE — Telephone Encounter (Signed)
Spoke to pt to let her know Rx was sent.  She said pharmacy did call her.  Lennette Bihari - CMA

## 2018-10-03 NOTE — Telephone Encounter (Signed)
Patient states [facility] in [place] has not received prescription for Vit D.  Patient isn't able to use her computer for My Chart msg.

## 2018-10-04 ENCOUNTER — Ambulatory Visit
Admission: RE | Admit: 2018-10-04 | Discharge: 2018-10-04 | Disposition: A | Discharge status: Home / Self Care | Payer: Medicare HMO | Source: Ambulatory Visit

## 2018-10-04 ENCOUNTER — Other Ambulatory Visit: Payer: Self-pay

## 2018-10-04 DIAGNOSIS — Z1231 Encounter for screening mammogram for malignant neoplasm of breast: Secondary | ICD-10-CM | POA: Diagnosis not present

## 2018-10-04 NOTE — Progress Notes (Signed)
Office: [phone]  /  Fax: [phone] TeleHealth Visit:  [name] has verbally consented to this TeleHealth visit today. The patient is located at home, the provider is located at the [employer] [facility] office. The participants in this visit include the listed provider and patient and any and all parties involved. The visit was conducted today via telephone. Kionna was unable to use realtime audiovisual technology today (FaceTime failed) and the telehealth visit was conducted via telephone.  HPI:   Chief Complaint: OBESITY Kelly Weber is here to discuss her progress with her obesity treatment plan. She is on the Category 2 plan and is following her eating plan approximately 80 % of the time. She states she is riding the stationary bike 15 to 20 minutes 3 to 5 times per week. [name] is off track with her diet, with traveling and she increased eating out and she thinks she has gained 2 pounds. She will be staying home for a while and she thinks she is ready to get back on track. She is satisfied with her Category 2 plan overall. We were unable to weigh the patient today for this TeleHealth visit. She feels as if she has gained weight since her last visit.   Pre-Diabetes [name] has a diagnosis of prediabetes based on her elevated Hgb A1c and was informed this puts her at greater risk of developing diabetes. She had GI upset with metformin and she stopped taking it. She notes increased polyphagia when she is not following her plan closely.   Vitamin D deficiency [name] has a diagnosis of vitamin D deficiency. [name] is stable on vit D, but she is not yet at goal. [name] denies nausea, vomiting or muscle weakness.  ASSESSMENT AND PLAN:  Prediabetes  Vitamin D deficiency - Plan: Vitamin D, Ergocalciferol, (DRISDOL) 1.25 MG (50000 UT) CAPS capsule  Class 2 severe obesity with serious comorbidity and body mass index (BMI) of 35.0 to 35.9 in adult, unspecified obesity type [facility])  PLAN:   Pre-Diabetes Derisha will continue to work on weight loss, exercise, and decreasing simple carbohydrates in her diet to help decrease the risk of diabetes. We dicussed metformin including benefits and risks. She was informed that eating too many simple carbohydrates or too many calories at one sitting increases the likelihood of GI side effects. Ame will discontinue metformin and get back to her diet prescription. Starasia agrees to follow up with [place] as directed to monitor her progress.  Vitamin D Deficiency [name] was informed that low vitamin D levels contributes to fatigue and are associated with obesity, breast, and colon cancer. She agrees to continue to take prescription Vit D @50 ,000 IU every week #4 with no refills and will follow up for routine testing of vitamin D, at least 2-3 times per year. She was informed of the risk of over-replacement of vitamin D and agrees to not increase her dose unless she discusses this with [place] first. [name] agrees to follow up as directed.  I spent > than 50% of the 25 minute visit on counseling as documented in the note.  Obesity [name] is currently in the action stage of change. As such, her goal is to continue with weight loss efforts She has agreed to follow the Category 2 plan [name] will start walking on the treadmill 10 minutes daily for weight loss and overall health benefits. We discussed the following Behavioral Modification Strategies today: increasing lean protein intake, decrease eating out and work on meal planning and easy cooking  plans  [name] has agreed to follow up with our clinic in 3 weeks. She was informed of the importance of frequent follow up visits to maximize her success with intensive lifestyle modifications for her multiple health conditions.  ALLERGIES: Allergies  Allergen Reactions  . Penicillins Hives and Swelling    MEDICATIONS: Current Outpatient Medications on File Prior to Visit  Medication Sig Dispense Refill  . Calcium  Carb-Cholecalciferol (GNP CALCIUM 600 +D3) 600-800 MG-UNIT TABS Take 1 tablet by mouth daily.     . fluticasone (FLONASE) 50 MCG/ACT nasal spray PLACE 2 SPRAYS INTO BOTH NOSTRILS DAILY. USE FOR 4-6 WEEKS THEN STOP AND USE SEASONALLY OR AS NEEDED 48 g 1  . loperamide (IMODIUM) 2 MG capsule Take 2 mg by mouth as needed for diarrhea or loose stools.    . meclizine (ANTIVERT) 25 MG tablet Take 1 tablet (25 mg total) by mouth 3 (three) times daily as needed for dizziness. 30 tablet 1  . Multiple Vitamin (MULTI-VITAMINS) TABS Take 1 tablet by mouth daily.     . rosuvastatin (CRESTOR) 10 MG tablet Take 1 tablet (10 mg total) by mouth at bedtime. (Patient not taking: Reported on 08/31/2018) 90 tablet 1  . Skin Protectants, Misc. (EUCERIN) cream Apply topically as needed for dry skin.    . valACYclovir (VALTREX) 1000 MG tablet Take 2,000 mg by mouth 2 (two) times daily as needed (fever blisters / rash).     . venlafaxine XR (EFFEXOR-XR) 37.5 MG 24 hr capsule Take 1 capsule (37.5 mg total) by mouth daily with breakfast. 90 capsule 1   No current facility-administered medications on file prior to visit.     PAST MEDICAL HISTORY: Past Medical History:  Diagnosis Date  . Allergies   . Anxiety   . Arthritis    fingers, left knee  . Depression   . Diverticulitis   . Dyspnea   . Fatigue   . GERD (gastroesophageal reflux disease)   . HLD (hyperlipidemia)   . Insomnia 2006  . Lower back pain   . Skin cancer 2007   Basal Cell CA resected from Left middle finger    PAST SURGICAL HISTORY: Past Surgical History:  Procedure Laterality Date  . BREAST CYST ASPIRATION    . COLONOSCOPY WITH PROPOFOL N/A 11/20/2014   Procedure: COLONOSCOPY WITH PROPOFOL;  Surgeon: Lucilla Lame, MD;  Location: [place];  Service: Endoscopy;  Laterality: N/A;  . ESOPHAGOGASTRODUODENOSCOPY (EGD) WITH PROPOFOL N/A 11/20/2014   Procedure: ESOPHAGOGASTRODUODENOSCOPY (EGD) WITH PROPOFOL;  Surgeon: Lucilla Lame, MD;   Location: [place];  Service: Endoscopy;  Laterality: N/A;  . POLYPECTOMY  11/20/2014   Procedure: POLYPECTOMY;  Surgeon: Lucilla Lame, MD;  Location: [place];  Service: Endoscopy;;  . SKIN CANCER EXCISION Left    finger  . TUBAL LIGATION  1970    SOCIAL HISTORY: Social History   Tobacco Use  . Smoking status: Former Smoker    Packs/day: 1.00    Years: 30.00    Pack years: 30.00    Types: Cigarettes    Quit date: 01/19/2014    Years since quitting: 4.7  . Smokeless tobacco: Former Network engineer Use Topics  . Alcohol use: No    Alcohol/week: 0.0 standard drinks  . Drug use: No    FAMILY HISTORY: Family History  Problem Relation Age of Onset  . Brain cancer Mother   . Thyroid disease Mother   . Hypertension Mother   . Breast cancer Maternal Aunt 26  .  Breast cancer Cousin 35    ROS: Review of Systems  Constitutional: Negative for weight loss.  Gastrointestinal: Negative for nausea and vomiting.  Musculoskeletal:       Negative for muscle weakness  Endo/Heme/Allergies:       Negative for hypoglycemia Positive for polyphagia    PHYSICAL EXAM: Pt in no acute distress  RECENT LABS AND TESTS: BMET    Component Value Date/Time   NA 137 08/31/2018 1346   NA 141 05/29/2018 1125   NA 141 02/14/2014 0818   K 3.8 08/31/2018 1346   K 4.2 02/14/2014 0818   CL 102 08/31/2018 1346   CL 107 02/14/2014 0818   CO2 25 08/31/2018 1346   CO2 29 02/14/2014 0818   GLUCOSE 110 (H) 08/31/2018 1346   GLUCOSE 88 02/14/2014 0818   BUN 20 08/31/2018 1346   BUN 12 05/29/2018 1125   BUN 13 02/14/2014 0818   CREATININE 0.85 08/31/2018 1346   CREATININE 0.86 07/28/2018 0802   CALCIUM 8.6 (L) 08/31/2018 1346   CALCIUM 8.1 (L) 02/14/2014 0818   GFRNONAA >60 08/31/2018 1346   GFRNONAA 67 07/28/2018 0802   GFRAA >60 08/31/2018 1346   GFRAA 78 07/28/2018 0802   Lab Results  Component Value Date   HGBA1C 5.8 (H) 07/28/2018   HGBA1C 5.6 05/29/2018    HGBA1C 5.6 01/25/2018   HGBA1C 5.4 12/17/2016   Lab Results  Component Value Date   INSULIN 5.9 05/29/2018   CBC    Component Value Date/Time   WBC 7.6 08/31/2018 1346   RBC 4.41 08/31/2018 1346   HGB 12.1 08/31/2018 1346   HGB 11.9 05/29/2018 1125   HCT 38.4 08/31/2018 1346   HCT 37.4 05/29/2018 1125   PLT 276 08/31/2018 1346   PLT 350 11/21/2015 0842   MCV 87.1 08/31/2018 1346   MCV 85 05/29/2018 1125   MCV 87 02/14/2014 0818   MCH 27.4 08/31/2018 1346   MCHC 31.5 08/31/2018 1346   RDW 14.6 08/31/2018 1346   RDW 14.5 05/29/2018 1125   RDW 14.4 02/14/2014 0818   LYMPHSABS 2.4 05/29/2018 1125   LYMPHSABS 2.1 02/14/2014 0818   MONOABS 455 09/29/2015 1656   MONOABS 0.4 02/14/2014 0818   EOSABS 0.1 05/29/2018 1125   EOSABS 0.1 02/14/2014 0818   BASOSABS 0.0 05/29/2018 1125   BASOSABS 0.0 02/14/2014 0818   Iron/TIBC/Ferritin/ %Sat    Component Value Date/Time   IRON 18 (L) 08/27/2015 0955   TIBC 362 08/27/2015 0955   FERRITIN 17 08/27/2015 0955   IRONPCTSAT 5 (LL) 08/27/2015 0955   Lipid Panel     Component Value Date/Time   CHOL 167 07/28/2018 0802   CHOL 133 05/29/2018 1125   CHOL 188 02/14/2014 0818   TRIG 146 07/28/2018 0802   TRIG 101 02/14/2014 0818   HDL 50 07/28/2018 0802   HDL 60 05/29/2018 1125   HDL 55 02/14/2014 0818   CHOLHDL 3.3 07/28/2018 0802   VLDL 20 02/14/2014 0818   LDLCALC 92 07/28/2018 0802   LDLCALC 113 (H) 02/14/2014 0818   Hepatic Function Panel     Component Value Date/Time   PROT 6.3 07/28/2018 0802   PROT 6.5 05/29/2018 1125   PROT 6.4 02/14/2014 0818   ALBUMIN 4.1 05/29/2018 1125   ALBUMIN 3.3 (L) 02/14/2014 0818   AST 21 07/28/2018 0802   AST 20 02/14/2014 0818   ALT 22 07/28/2018 0802   ALT 24 02/14/2014 0818   ALKPHOS 85 05/29/2018 1125   ALKPHOS 92  02/14/2014 0818   BILITOT 0.4 07/28/2018 0802   BILITOT 0.5 05/29/2018 1125   BILITOT 0.5 02/14/2014 0818      Component Value Date/Time   TSH 2.230 05/29/2018 1125    TSH 1.50 01/25/2018 0811   TSH 2.270 08/27/2015 0955     Ref. Range 05/29/2018 11:25  Vitamin D, 25-Hydroxy Latest Ref Range: 30.0 - 100.0 ng/mL 34.8    I, Doreene Nest, am acting as Location manager for Dennard Nip, MD I have reviewed the above documentation for accuracy and completeness, and I agree with the above. -Dennard Nip, MD

## 2018-10-05 ENCOUNTER — Other Ambulatory Visit: Payer: Self-pay | Admitting: Family Medicine

## 2018-10-05 DIAGNOSIS — R928 Other abnormal and inconclusive findings on diagnostic imaging of breast: Secondary | ICD-10-CM

## 2018-10-18 DIAGNOSIS — D2272 Melanocytic nevi of left lower limb, including hip: Secondary | ICD-10-CM | POA: Diagnosis not present

## 2018-10-18 DIAGNOSIS — D225 Melanocytic nevi of trunk: Secondary | ICD-10-CM | POA: Diagnosis not present

## 2018-10-18 DIAGNOSIS — D2262 Melanocytic nevi of left upper limb, including shoulder: Secondary | ICD-10-CM | POA: Diagnosis not present

## 2018-10-18 DIAGNOSIS — D2271 Melanocytic nevi of right lower limb, including hip: Secondary | ICD-10-CM | POA: Diagnosis not present

## 2018-10-18 DIAGNOSIS — Z85828 Personal history of other malignant neoplasm of skin: Secondary | ICD-10-CM | POA: Diagnosis not present

## 2018-10-18 DIAGNOSIS — L821 Other seborrheic keratosis: Secondary | ICD-10-CM | POA: Diagnosis not present

## 2018-10-18 DIAGNOSIS — D2261 Melanocytic nevi of right upper limb, including shoulder: Secondary | ICD-10-CM | POA: Diagnosis not present

## 2018-10-18 DIAGNOSIS — B001 Herpesviral vesicular dermatitis: Secondary | ICD-10-CM | POA: Diagnosis not present

## 2018-10-18 DIAGNOSIS — L820 Inflamed seborrheic keratosis: Secondary | ICD-10-CM | POA: Diagnosis not present

## 2018-10-18 DIAGNOSIS — Z08 Encounter for follow-up examination after completed treatment for malignant neoplasm: Secondary | ICD-10-CM | POA: Diagnosis not present

## 2018-10-19 ENCOUNTER — Encounter (INDEPENDENT_AMBULATORY_CARE_PROVIDER_SITE_OTHER): Payer: Self-pay | Admitting: Family Medicine

## 2018-10-19 ENCOUNTER — Telehealth (INDEPENDENT_AMBULATORY_CARE_PROVIDER_SITE_OTHER): Payer: Medicare HMO | Admitting: Family Medicine

## 2018-10-19 ENCOUNTER — Other Ambulatory Visit: Payer: Self-pay

## 2018-10-19 DIAGNOSIS — E559 Vitamin D deficiency, unspecified: Secondary | ICD-10-CM | POA: Diagnosis not present

## 2018-10-19 DIAGNOSIS — Z6835 Body mass index (BMI) 35.0-35.9, adult: Secondary | ICD-10-CM | POA: Diagnosis not present

## 2018-10-19 MED ORDER — VITAMIN D (ERGOCALCIFEROL) 1.25 MG (50000 UNIT) PO CAPS: 50000.0000 [IU] | ORAL_CAPSULE | ORAL | 0 refills | Status: DC

## 2018-10-23 ENCOUNTER — Other Ambulatory Visit (INDEPENDENT_AMBULATORY_CARE_PROVIDER_SITE_OTHER): Payer: Self-pay | Admitting: Family Medicine

## 2018-10-23 DIAGNOSIS — E559 Vitamin D deficiency, unspecified: Secondary | ICD-10-CM

## 2018-10-24 NOTE — Progress Notes (Signed)
Office: [phone]  /  Fax: [phone] TeleHealth Visit:  Kelly Weber has verbally consented to this TeleHealth visit today. The patient is located at home, the provider is located at the [facility] office. The participants in this visit include the listed provider and patient. Kelly Weber was unable to use realtime audiovisual technology today and the telehealth visit was conducted via telephone.   HPI:   Chief Complaint: OBESITY Kelly Weber is here to discuss her progress with her obesity treatment plan. She is on the Category 2 plan and is following her eating plan approximately 50 % of the time. She states she is doing stretches and riding stationary bike for 20 minutes 4 times per week. Kelly Weber states she is doing well maintaining her weight since our last visit. She is traveling a lot (1-2 times a month) to different parts of the country to stay at casinos. She is eating out more but she is trying to portion control better.  We were unable to weigh the patient today for this TeleHealth visit. She feels as if she has maintained her weight since her last visit. She has lost 6 lbs since starting treatment with [place].  Vitamin D Deficiency Kelly Weber has a diagnosis of vitamin D deficiency. She is stable on prescription Vit D and denies nausea, vomiting or muscle weakness.  ASSESSMENT AND PLAN:  Class 2 severe obesity with serious comorbidity and body mass index (BMI) of 35.0 to 35.9 in adult, unspecified obesity type (HCC)  Vitamin D deficiency - Plan: Vitamin D, Ergocalciferol, (DRISDOL) 1.25 MG (50000 UT) CAPS capsule  PLAN:  Vitamin D Deficiency Kelly Weber was informed that low vitamin D levels contributes to fatigue and are associated with obesity, breast, and colon cancer. Kelly Weber agrees to continue taking prescription Vit D 50,000 IU every week #4 and we will refill for 1 month. She will follow up for routine testing of vitamin D, at least 2-3 times per year. She was informed of the risk of  over-replacement of vitamin D and agrees to not increase her dose unless she discusses this with [place] first. Kelly Weber agrees to follow up with our clinic in 2 weeks.  I spent > than 50% of the 15 minute visit on counseling as documented in the note.  Obesity Kelly Weber is currently in the action stage of change. As such, her goal is to maintain weight for now  Kelly Weber's goal is to maintain her weight while she travels and she was reminded to wear a mask and socially isolate as much as possible. She has agreed to follow the Category 2 plan Kelly Weber has been instructed to work up to a goal of 150 minutes of combined cardio and strengthening exercise per week for weight loss and overall health benefits. We discussed the following Behavioral Modification Strategies today: travel eating strategies    Kelly Weber has agreed to follow up with our clinic in 2 weeks. She was informed of the importance of frequent follow up visits to maximize her success with intensive lifestyle modifications for her multiple health conditions.  ALLERGIES: Allergies  Allergen Reactions  . Penicillins Hives and Swelling    MEDICATIONS: Current Outpatient Medications on File Prior to Visit  Medication Sig Dispense Refill  . Calcium Carb-Cholecalciferol (GNP CALCIUM 600 +D3) 600-800 MG-UNIT TABS Take 1 tablet by mouth daily.     . fluticasone (FLONASE) 50 MCG/ACT nasal spray PLACE 2 SPRAYS INTO BOTH NOSTRILS DAILY. USE FOR 4-6 WEEKS THEN STOP AND USE SEASONALLY OR AS NEEDED  48 g 1  . loperamide (IMODIUM) 2 MG capsule Take 2 mg by mouth as needed for diarrhea or loose stools.    . meclizine (ANTIVERT) 25 MG tablet Take 1 tablet (25 mg total) by mouth 3 (three) times daily as needed for dizziness. 30 tablet 1  . Multiple Vitamin (MULTI-VITAMINS) TABS Take 1 tablet by mouth daily.     . rosuvastatin (CRESTOR) 10 MG tablet Take 1 tablet (10 mg total) by mouth at bedtime. 90 tablet 1  . Skin Protectants, Misc. (EUCERIN) cream Apply topically as  needed for dry skin.    . valACYclovir (VALTREX) 1000 MG tablet Take 2,000 mg by mouth 2 (two) times daily as needed (fever blisters / rash).     . venlafaxine XR (EFFEXOR-XR) 37.5 MG 24 hr capsule Take 1 capsule (37.5 mg total) by mouth daily with breakfast. 90 capsule 1   No current facility-administered medications on file prior to visit.     PAST MEDICAL HISTORY: Past Medical History:  Diagnosis Date  . Allergies   . Anxiety   . Arthritis    fingers, left knee  . Depression   . Diverticulitis   . Dyspnea   . Fatigue   . GERD (gastroesophageal reflux disease)   . HLD (hyperlipidemia)   . Insomnia 2006  . Lower back pain   . Skin cancer 2007   Basal Cell CA resected from Left middle finger    PAST SURGICAL HISTORY: Past Surgical History:  Procedure Laterality Date  . BREAST CYST ASPIRATION    . COLONOSCOPY WITH PROPOFOL N/A 11/20/2014   Procedure: COLONOSCOPY WITH PROPOFOL;  Surgeon: Kelly Lame, MD;  Location: [facility];  Service: Endoscopy;  Laterality: N/A;  . ESOPHAGOGASTRODUODENOSCOPY (EGD) WITH PROPOFOL N/A 11/20/2014   Procedure: ESOPHAGOGASTRODUODENOSCOPY (EGD) WITH PROPOFOL;  Surgeon: Kelly Lame, MD;  Location: [facility];  Service: Endoscopy;  Laterality: N/A;  . POLYPECTOMY  11/20/2014   Procedure: POLYPECTOMY;  Surgeon: Kelly Lame, MD;  Location: [facility];  Service: Endoscopy;;  . SKIN CANCER EXCISION Left    finger  . TUBAL LIGATION  1970    SOCIAL HISTORY: Social History   Tobacco Use  . Smoking status: Former Smoker    Packs/day: 1.00    Years: 30.00    Pack years: 30.00    Types: Cigarettes    Quit date: 01/19/2014    Years since quitting: 4.7  . Smokeless tobacco: Former Network engineer Use Topics  . Alcohol use: No    Alcohol/week: 0.0 standard drinks  . Drug use: No    FAMILY HISTORY: Family History  Problem Relation Age of Onset  . Brain cancer Mother   . Thyroid disease Mother   . Hypertension Mother    . Breast cancer Maternal Aunt 26  . Breast cancer Cousin 15  . Breast cancer Maternal Aunt     ROS: Review of Systems  Constitutional: Negative for weight loss.  Gastrointestinal: Negative for nausea and vomiting.  Musculoskeletal:       Negative muscle weakness    PHYSICAL EXAM: Pt in no acute distress  RECENT LABS AND TESTS: BMET    Component Value Date/Time   NA 137 08/31/2018 1346   NA 141 05/29/2018 1125   NA 141 02/14/2014 0818   K 3.8 08/31/2018 1346   K 4.2 02/14/2014 0818   CL 102 08/31/2018 1346   CL 107 02/14/2014 0818   CO2 25 08/31/2018 1346   CO2 29 02/14/2014 0818  GLUCOSE 110 (H) 08/31/2018 1346   GLUCOSE 88 02/14/2014 0818   BUN 20 08/31/2018 1346   BUN 12 05/29/2018 1125   BUN 13 02/14/2014 0818   CREATININE 0.85 08/31/2018 1346   CREATININE 0.86 07/28/2018 0802   CALCIUM 8.6 (L) 08/31/2018 1346   CALCIUM 8.1 (L) 02/14/2014 0818   GFRNONAA >60 08/31/2018 1346   GFRNONAA 67 07/28/2018 0802   GFRAA >60 08/31/2018 1346   GFRAA 78 07/28/2018 0802   Lab Results  Component Value Date   HGBA1C 5.8 (H) 07/28/2018   HGBA1C 5.6 05/29/2018   HGBA1C 5.6 01/25/2018   HGBA1C 5.4 12/17/2016   Lab Results  Component Value Date   INSULIN 5.9 05/29/2018   CBC    Component Value Date/Time   WBC 7.6 08/31/2018 1346   RBC 4.41 08/31/2018 1346   HGB 12.1 08/31/2018 1346   HGB 11.9 05/29/2018 1125   HCT 38.4 08/31/2018 1346   HCT 37.4 05/29/2018 1125   PLT 276 08/31/2018 1346   PLT 350 11/21/2015 0842   MCV 87.1 08/31/2018 1346   MCV 85 05/29/2018 1125   MCV 87 02/14/2014 0818   MCH 27.4 08/31/2018 1346   MCHC 31.5 08/31/2018 1346   RDW 14.6 08/31/2018 1346   RDW 14.5 05/29/2018 1125   RDW 14.4 02/14/2014 0818   LYMPHSABS 2.4 05/29/2018 1125   LYMPHSABS 2.1 02/14/2014 0818   MONOABS 455 09/29/2015 1656   MONOABS 0.4 02/14/2014 0818   EOSABS 0.1 05/29/2018 1125   EOSABS 0.1 02/14/2014 0818   BASOSABS 0.0 05/29/2018 1125   BASOSABS 0.0  02/14/2014 0818   Iron/TIBC/Ferritin/ %Sat    Component Value Date/Time   IRON 18 (L) 08/27/2015 0955   TIBC 362 08/27/2015 0955   FERRITIN 17 08/27/2015 0955   IRONPCTSAT 5 (LL) 08/27/2015 0955   Lipid Panel     Component Value Date/Time   CHOL 167 07/28/2018 0802   CHOL 133 05/29/2018 1125   CHOL 188 02/14/2014 0818   TRIG 146 07/28/2018 0802   TRIG 101 02/14/2014 0818   HDL 50 07/28/2018 0802   HDL 60 05/29/2018 1125   HDL 55 02/14/2014 0818   CHOLHDL 3.3 07/28/2018 0802   VLDL 20 02/14/2014 0818   LDLCALC 92 07/28/2018 0802   LDLCALC 113 (H) 02/14/2014 0818   Hepatic Function Panel     Component Value Date/Time   PROT 6.3 07/28/2018 0802   PROT 6.5 05/29/2018 1125   PROT 6.4 02/14/2014 0818   ALBUMIN 4.1 05/29/2018 1125   ALBUMIN 3.3 (L) 02/14/2014 0818   AST 21 07/28/2018 0802   AST 20 02/14/2014 0818   ALT 22 07/28/2018 0802   ALT 24 02/14/2014 0818   ALKPHOS 85 05/29/2018 1125   ALKPHOS 92 02/14/2014 0818   BILITOT 0.4 07/28/2018 0802   BILITOT 0.5 05/29/2018 1125   BILITOT 0.5 02/14/2014 0818      Component Value Date/Time   TSH 2.230 05/29/2018 1125   TSH 1.50 01/25/2018 0811   TSH 2.270 08/27/2015 0955      I, Trixie Dredge, am acting as transcriptionist for Dennard Nip, MD I have reviewed the above documentation for accuracy and completeness, and I agree with the above. -Dennard Nip, MD

## 2018-11-02 ENCOUNTER — Encounter (INDEPENDENT_AMBULATORY_CARE_PROVIDER_SITE_OTHER): Payer: Self-pay | Admitting: Family Medicine

## 2018-11-02 ENCOUNTER — Telehealth (INDEPENDENT_AMBULATORY_CARE_PROVIDER_SITE_OTHER): Payer: Medicare HMO | Admitting: Family Medicine

## 2018-11-02 ENCOUNTER — Other Ambulatory Visit: Payer: Self-pay

## 2018-11-02 DIAGNOSIS — E559 Vitamin D deficiency, unspecified: Secondary | ICD-10-CM | POA: Diagnosis not present

## 2018-11-02 DIAGNOSIS — R69 Illness, unspecified: Secondary | ICD-10-CM | POA: Diagnosis not present

## 2018-11-02 DIAGNOSIS — Z6835 Body mass index (BMI) 35.0-35.9, adult: Secondary | ICD-10-CM | POA: Diagnosis not present

## 2018-11-02 DIAGNOSIS — F3289 Other specified depressive episodes: Secondary | ICD-10-CM | POA: Diagnosis not present

## 2018-11-02 MED ORDER — TOPIRAMATE 25 MG PO TABS: 25.0000 mg | ORAL_TABLET | Freq: Every day | ORAL | 0 refills | Status: DC

## 2018-11-06 ENCOUNTER — Ambulatory Visit
Admission: RE | Admit: 2018-11-06 | Discharge: 2018-11-06 | Disposition: A | Discharge status: Home / Self Care | Payer: Medicare HMO | Source: Ambulatory Visit | Attending: Family Medicine | Admitting: Family Medicine

## 2018-11-06 ENCOUNTER — Other Ambulatory Visit: Payer: Self-pay

## 2018-11-06 DIAGNOSIS — R928 Other abnormal and inconclusive findings on diagnostic imaging of breast: Secondary | ICD-10-CM

## 2018-11-06 DIAGNOSIS — N6012 Diffuse cystic mastopathy of left breast: Secondary | ICD-10-CM | POA: Diagnosis not present

## 2018-11-06 NOTE — Progress Notes (Signed)
Office: [phone]  /  Fax: [phone] TeleHealth Visit:  [name] has verbally consented to this TeleHealth visit today. The patient is located at home, the provider is located at the [facility] office. The participants in this visit include the listed provider and patient and any and all parties involved. The visit was conducted today via telephone. Malena was unable to use realtime audiovisual technology today (Doxy[employer]me failed) and the telehealth visit was conducted via telephone.  HPI:   Chief Complaint: OBESITY Kelly Weber is here to discuss her progress with her obesity treatment plan. She is on the Category 2 plan and is following her eating plan approximately 85 % of the time. She states she is cardio exercise and stretches 20 minutes 4 times per week. Sierra continues to work on [employer] loss and she feels she has lost another 1 pound since her last visit. She states she struggles with hunger, but after more in depth questioning, it appears to be increased emotional eating, and comfort eating. We were unable to weigh the patient today for this TeleHealth visit. She feels as if she has lost weight since her last visit. She has lost 6 lbs since starting treatment with [place].  Vitamin D deficiency Shanitra has a diagnosis of vitamin D deficiency. Tonjua is stable on vit D and she denies nausea, vomiting or muscle weakness. Karlena is doing well remembering to take her weekly dose of vitamin D.  Depression with emotional eating behaviors Seriah is on Effexor for two years and she feels it has helped with her mood. She still struggles with cravings and emotional eating though. Drishti struggles with emotional eating and using food for comfort to the extent that it is negatively impacting her health. She often snacks when she is not hungry. Cesar sometimes feels she is out of control and then feels guilty that she made poor food choices. She has been working on behavior modification techniques to  help reduce her emotional eating and has been somewhat successful. She shows no sign of suicidal or homicidal ideations.  ASSESSMENT AND PLAN:  Other depression - Plan: topiramate (TOPAMAX) 25 MG tablet  Vitamin D deficiency  Class 2 severe obesity with serious comorbidity and body mass index (BMI) of 35.0 to 35.9 in adult, unspecified obesity type ([place])  PLAN:  Vitamin D Deficiency Kimberleigh was informed that low vitamin D levels contributes to fatigue and are associated with obesity, breast, and colon cancer. She will continue to take prescription Vit D @50 ,000 IU every week and will follow up for routine testing of vitamin D, at least 2-3 times per year. She was informed of the risk of over-replacement of vitamin D and agrees to not increase her dose unless she discusses this with [place] first. We will check labs in one month and Jamyah agrees to follow up as directed.  Depression with Emotional Eating Behaviors We discussed behavior modification techniques today to help Audria deal with her emotional eating and depression. She has agreed to start Topiramate 25 mg qHS #30 with no refills (patient to start at 1/2 pill the 1st week) and follow up as directed. We will follow patient closely.  I spent > than 50% of the 25 minute visit on counseling as documented in the note.  Obesity Jahlia is currently in the action stage of change. As such, her goal is to continue with weight loss efforts She has agreed to follow the Category 2 plan Allysia has been instructed to work up  to a goal of 150 minutes of combined cardio and strengthening exercise per week for weight loss and overall health benefits. We discussed the following Behavioral Modification Strategies today: increase H2O intake, no skipping meals and increasing lean protein intake  Savina has agreed to follow up with our clinic in 3 weeks. She was informed of the importance of frequent follow up visits to maximize her success with intensive lifestyle  modifications for her multiple health conditions.  ALLERGIES: Allergies  Allergen Reactions  . Penicillins Hives and Swelling    MEDICATIONS: Current Outpatient Medications on File Prior to Visit  Medication Sig Dispense Refill  . Calcium Carb-Cholecalciferol (GNP CALCIUM 600 +D3) 600-800 MG-UNIT TABS Take 1 tablet by mouth daily.     . fluticasone (FLONASE) 50 MCG/ACT nasal spray PLACE 2 SPRAYS INTO BOTH NOSTRILS DAILY. USE FOR 4-6 WEEKS THEN STOP AND USE SEASONALLY OR AS NEEDED 48 g 1  . Multiple Vitamin (MULTI-VITAMINS) TABS Take 1 tablet by mouth daily.     . Skin Protectants, Misc. (EUCERIN) cream Apply topically as needed for dry skin.    . valACYclovir (VALTREX) 1000 MG tablet Take 2,000 mg by mouth 2 (two) times daily as needed (fever blisters / rash).     . venlafaxine XR (EFFEXOR-XR) 37.5 MG 24 hr capsule Take 1 capsule (37.5 mg total) by mouth daily with breakfast. 90 capsule 1  . Vitamin D, Ergocalciferol, (DRISDOL) 1.25 MG (50000 UT) CAPS capsule Take 1 capsule (50,000 Units total) by mouth every 7 (seven) days. 4 capsule 0   No current facility-administered medications on file prior to visit.     PAST MEDICAL HISTORY: Past Medical History:  Diagnosis Date  . Allergies   . Anxiety   . Arthritis    fingers, left knee  . Depression   . Diverticulitis   . Dyspnea   . Fatigue   . GERD (gastroesophageal reflux disease)   . HLD (hyperlipidemia)   . Insomnia 2006  . Lower back pain   . Skin cancer 2007   Basal Cell CA resected from Left middle finger    PAST SURGICAL HISTORY: Past Surgical History:  Procedure Laterality Date  . BREAST CYST ASPIRATION    . COLONOSCOPY WITH PROPOFOL N/A 11/20/2014   Procedure: COLONOSCOPY WITH PROPOFOL;  Surgeon: Lucilla Lame, MD;  Location: [facility];  Service: Endoscopy;  Laterality: N/A;  . ESOPHAGOGASTRODUODENOSCOPY (EGD) WITH PROPOFOL N/A 11/20/2014   Procedure: ESOPHAGOGASTRODUODENOSCOPY (EGD) WITH PROPOFOL;  Surgeon:  Lucilla Lame, MD;  Location: [facility];  Service: Endoscopy;  Laterality: N/A;  . POLYPECTOMY  11/20/2014   Procedure: POLYPECTOMY;  Surgeon: Lucilla Lame, MD;  Location: [facility];  Service: Endoscopy;;  . SKIN CANCER EXCISION Left    finger  . TUBAL LIGATION  1970    SOCIAL HISTORY: Social History   Tobacco Use  . Smoking status: Former Smoker    Packs/day: 1.00    Years: 30.00    Pack years: 30.00    Types: Cigarettes    Quit date: 01/19/2014    Years since quitting: 4.8  . Smokeless tobacco: Former Network engineer Use Topics  . Alcohol use: No    Alcohol/week: 0.0 standard drinks  . Drug use: No    FAMILY HISTORY: Family History  Problem Relation Age of Onset  . Brain cancer Mother   . Thyroid disease Mother   . Hypertension Mother   . Breast cancer Maternal Aunt 26  . Breast cancer Cousin 40  .  Breast cancer Maternal Aunt     ROS: Review of Systems  Constitutional: Positive for weight loss.  Gastrointestinal: Negative for nausea and vomiting.  Musculoskeletal:       Negative for muscle weakness  Endo/Heme/Allergies:       Positive for cravings  Psychiatric/Behavioral: Positive for depression. Negative for suicidal ideas.    PHYSICAL EXAM: Pt in no acute distress  RECENT LABS AND TESTS: BMET    Component Value Date/Time   NA 137 08/31/2018 1346   NA 141 05/29/2018 1125   NA 141 02/14/2014 0818   K 3.8 08/31/2018 1346   K 4.2 02/14/2014 0818   CL 102 08/31/2018 1346   CL 107 02/14/2014 0818   CO2 25 08/31/2018 1346   CO2 29 02/14/2014 0818   GLUCOSE 110 (H) 08/31/2018 1346   GLUCOSE 88 02/14/2014 0818   BUN 20 08/31/2018 1346   BUN 12 05/29/2018 1125   BUN 13 02/14/2014 0818   CREATININE 0.85 08/31/2018 1346   CREATININE 0.86 07/28/2018 0802   CALCIUM 8.6 (L) 08/31/2018 1346   CALCIUM 8.1 (L) 02/14/2014 0818   GFRNONAA >60 08/31/2018 1346   GFRNONAA 67 07/28/2018 0802   GFRAA >60 08/31/2018 1346   GFRAA 78 07/28/2018 0802    Lab Results  Component Value Date   HGBA1C 5.8 (H) 07/28/2018   HGBA1C 5.6 05/29/2018   HGBA1C 5.6 01/25/2018   HGBA1C 5.4 12/17/2016   Lab Results  Component Value Date   INSULIN 5.9 05/29/2018   CBC    Component Value Date/Time   WBC 7.6 08/31/2018 1346   RBC 4.41 08/31/2018 1346   HGB 12.1 08/31/2018 1346   HGB 11.9 05/29/2018 1125   HCT 38.4 08/31/2018 1346   HCT 37.4 05/29/2018 1125   PLT 276 08/31/2018 1346   PLT 350 11/21/2015 0842   MCV 87.1 08/31/2018 1346   MCV 85 05/29/2018 1125   MCV 87 02/14/2014 0818   MCH 27.4 08/31/2018 1346   MCHC 31.5 08/31/2018 1346   RDW 14.6 08/31/2018 1346   RDW 14.5 05/29/2018 1125   RDW 14.4 02/14/2014 0818   LYMPHSABS 2.4 05/29/2018 1125   LYMPHSABS 2.1 02/14/2014 0818   MONOABS 455 09/29/2015 1656   MONOABS 0.4 02/14/2014 0818   EOSABS 0.1 05/29/2018 1125   EOSABS 0.1 02/14/2014 0818   BASOSABS 0.0 05/29/2018 1125   BASOSABS 0.0 02/14/2014 0818   Iron/TIBC/Ferritin/ %Sat    Component Value Date/Time   IRON 18 (L) 08/27/2015 0955   TIBC 362 08/27/2015 0955   FERRITIN 17 08/27/2015 0955   IRONPCTSAT 5 (LL) 08/27/2015 0955   Lipid Panel     Component Value Date/Time   CHOL 167 07/28/2018 0802   CHOL 133 05/29/2018 1125   CHOL 188 02/14/2014 0818   TRIG 146 07/28/2018 0802   TRIG 101 02/14/2014 0818   HDL 50 07/28/2018 0802   HDL 60 05/29/2018 1125   HDL 55 02/14/2014 0818   CHOLHDL 3.3 07/28/2018 0802   VLDL 20 02/14/2014 0818   LDLCALC 92 07/28/2018 0802   LDLCALC 113 (H) 02/14/2014 0818   Hepatic Function Panel     Component Value Date/Time   PROT 6.3 07/28/2018 0802   PROT 6.5 05/29/2018 1125   PROT 6.4 02/14/2014 0818   ALBUMIN 4.1 05/29/2018 1125   ALBUMIN 3.3 (L) 02/14/2014 0818   AST 21 07/28/2018 0802   AST 20 02/14/2014 0818   ALT 22 07/28/2018 0802   ALT 24 02/14/2014 0818   ALKPHOS  85 05/29/2018 1125   ALKPHOS 92 02/14/2014 0818   BILITOT 0.4 07/28/2018 0802   BILITOT 0.5 05/29/2018  1125   BILITOT 0.5 02/14/2014 0818      Component Value Date/Time   TSH 2.230 05/29/2018 1125   TSH 1.50 01/25/2018 0811   TSH 2.270 08/27/2015 0955     Ref. Range 05/29/2018 11:25  Vitamin D, 25-Hydroxy Latest Ref Range: 30.0 - 100.0 ng/mL 34.8    I, Doreene Nest, am acting as Location manager for Dennard Nip, MD I have reviewed the above documentation for accuracy and completeness, and I agree with the above. -Dennard Nip, MD

## 2018-11-15 ENCOUNTER — Other Ambulatory Visit (INDEPENDENT_AMBULATORY_CARE_PROVIDER_SITE_OTHER): Payer: Self-pay | Admitting: Family Medicine

## 2018-11-15 DIAGNOSIS — E559 Vitamin D deficiency, unspecified: Secondary | ICD-10-CM

## 2018-11-23 ENCOUNTER — Telehealth (INDEPENDENT_AMBULATORY_CARE_PROVIDER_SITE_OTHER): Payer: Medicare HMO | Admitting: Family Medicine

## 2018-11-23 ENCOUNTER — Other Ambulatory Visit: Payer: Self-pay

## 2018-11-23 ENCOUNTER — Encounter (INDEPENDENT_AMBULATORY_CARE_PROVIDER_SITE_OTHER): Payer: Self-pay | Admitting: Family Medicine

## 2018-11-23 DIAGNOSIS — E559 Vitamin D deficiency, unspecified: Secondary | ICD-10-CM | POA: Diagnosis not present

## 2018-11-23 DIAGNOSIS — Z6835 Body mass index (BMI) 35.0-35.9, adult: Secondary | ICD-10-CM

## 2018-11-23 DIAGNOSIS — E66812 Obesity, class 2: Secondary | ICD-10-CM | POA: Insufficient documentation

## 2018-11-23 MED ORDER — VITAMIN D (ERGOCALCIFEROL) 1.25 MG (50000 UNIT) PO CAPS: 50000.0000 [IU] | ORAL_CAPSULE | ORAL | 0 refills | Status: DC

## 2018-11-24 ENCOUNTER — Other Ambulatory Visit (INDEPENDENT_AMBULATORY_CARE_PROVIDER_SITE_OTHER): Payer: Self-pay | Admitting: Family Medicine

## 2018-11-24 DIAGNOSIS — F3289 Other specified depressive episodes: Secondary | ICD-10-CM

## 2018-11-29 NOTE — Progress Notes (Signed)
Office: [phone]  /  Fax: [phone] TeleHealth Visit:  Kelly Weber has verbally consented to this TeleHealth visit today. The patient is located at home, the provider is located at the [facility] office. The participants in this visit include the listed provider and patient. [name] was unable to use realtime audiovisual technology today and the telehealth visit was conducted via telephone.    HPI:   Chief Complaint: OBESITY Kelly Weber is here to discuss her progress with her obesity treatment plan. She is on the Category 2 plan and is following her eating plan approximately 0 % of the time. She states she did a lot of walking on vacation. Kelly Weber has been on vacation and has not been following her plan. She feels she has gained weight. She is ready to get back on track with her Category 2 plan.  We were unable to weigh the patient today for this TeleHealth visit. She feels as if she has gained weight since her last visit. She has lost 6 lbs since starting treatment with [place].  Vitamin D Deficiency Kelly Weber has a diagnosis of vitamin D deficiency. She is stable on prescription Vit D, but last level was not yet at goal. She denies nausea, vomiting or muscle weakness.  ASSESSMENT AND PLAN:  Vitamin D deficiency - Plan: Vitamin D, Ergocalciferol, (DRISDOL) 1.25 MG (50000 UT) CAPS capsule  Class 2 severe obesity with serious comorbidity and body mass index (BMI) of 35.0 to 35.9 in adult, unspecified obesity type ([place])  PLAN:  Vitamin D Deficiency Kelly Weber was informed that low vitamin D levels contributes to fatigue and are associated with obesity, breast, and colon cancer. [name] agrees to continue taking prescription Vit D 50,000 IU every week and will follow up for routine testing of vitamin D, at least 2-3 times per year. She was informed of the risk of over-replacement of vitamin D and agrees to not increase her dose unless she discusses this with [place] first. We will recheck labs at her  next in office visit. Kelly Weber agrees to follow up with our clinic in 3 weeks.  Obesity Kelly Weber is currently in the action stage of change. As such, her goal is to continue with weight loss efforts She has agreed to follow the Category 2 plan Kelly Weber has been instructed to work up to a goal of 150 minutes of combined cardio and strengthening exercise per week for weight loss and overall health benefits. We discussed the following Behavioral Modification Strategies today: increasing lean protein intake, no skipping meals, and work on meal planning and easy cooking plans   Kelly Weber has agreed to follow up with our clinic in 3 weeks. She was informed of the importance of frequent follow up visits to maximize her success with intensive lifestyle modifications for her multiple health conditions.  ALLERGIES: Allergies  Allergen Reactions  . Penicillins Hives and Swelling    MEDICATIONS: Current Outpatient Medications on File Prior to Visit  Medication Sig Dispense Refill  . Calcium Carb-Cholecalciferol (GNP CALCIUM 600 +D3) 600-800 MG-UNIT TABS Take 1 tablet by mouth daily.     . fluticasone (FLONASE) 50 MCG/ACT nasal spray PLACE 2 SPRAYS INTO BOTH NOSTRILS DAILY. USE FOR 4-6 WEEKS THEN STOP AND USE SEASONALLY OR AS NEEDED 48 g 1  . Multiple Vitamin (MULTI-VITAMINS) TABS Take 1 tablet by mouth daily.     . Skin Protectants, Misc. (EUCERIN) cream Apply topically as needed for dry skin.    Kelly Weber topiramate (TOPAMAX) 25 MG tablet Take  1 tablet (25 mg total) by mouth at bedtime. 30 tablet 0  . valACYclovir (VALTREX) 1000 MG tablet Take 2,000 mg by mouth 2 (two) times daily as needed (fever blisters / rash).     . venlafaxine XR (EFFEXOR-XR) 37.5 MG 24 hr capsule Take 1 capsule (37.5 mg total) by mouth daily with breakfast. 90 capsule 1   No current facility-administered medications on file prior to visit.     PAST MEDICAL HISTORY: Past Medical History:  Diagnosis Date  . Allergies   . Anxiety   . Arthritis     fingers, left knee  . Depression   . Diverticulitis   . Dyspnea   . Fatigue   . GERD (gastroesophageal reflux disease)   . HLD (hyperlipidemia)   . Insomnia 2006  . Lower back pain   . Skin cancer 2007   Basal Cell CA resected from Left middle finger    PAST SURGICAL HISTORY: Past Surgical History:  Procedure Laterality Date  . BREAST CYST ASPIRATION    . COLONOSCOPY WITH PROPOFOL N/A 11/20/2014   Procedure: COLONOSCOPY WITH PROPOFOL;  Surgeon: Lucilla Lame, MD;  Location: [place];  Service: Endoscopy;  Laterality: N/A;  . ESOPHAGOGASTRODUODENOSCOPY (EGD) WITH PROPOFOL N/A 11/20/2014   Procedure: ESOPHAGOGASTRODUODENOSCOPY (EGD) WITH PROPOFOL;  Surgeon: Lucilla Lame, MD;  Location: [place];  Service: Endoscopy;  Laterality: N/A;  . POLYPECTOMY  11/20/2014   Procedure: POLYPECTOMY;  Surgeon: Lucilla Lame, MD;  Location: [place];  Service: Endoscopy;;  . SKIN CANCER EXCISION Left    finger  . TUBAL LIGATION  1970    SOCIAL HISTORY: Social History   Tobacco Use  . Smoking status: Former Smoker    Packs/day: 1.00    Years: 30.00    Pack years: 30.00    Types: Cigarettes    Quit date: 01/19/2014    Years since quitting: 4.8  . Smokeless tobacco: Former Network engineer Use Topics  . Alcohol use: No    Alcohol/week: 0.0 standard drinks  . Drug use: No    FAMILY HISTORY: Family History  Problem Relation Age of Onset  . Brain cancer Mother   . Thyroid disease Mother   . Hypertension Mother   . Breast cancer Maternal Aunt 26  . Breast cancer Cousin 58  . Breast cancer Maternal Aunt     ROS: Review of Systems  Constitutional: Negative for weight loss.  Gastrointestinal: Negative for nausea and vomiting.  Musculoskeletal:       Negative muscle weakness    PHYSICAL EXAM: Pt in no acute distress  RECENT LABS AND TESTS: BMET    Component Value Date/Time   NA 137 08/31/2018 1346   NA 141 05/29/2018 1125   NA 141 02/14/2014  0818   K 3.8 08/31/2018 1346   K 4.2 02/14/2014 0818   CL 102 08/31/2018 1346   CL 107 02/14/2014 0818   CO2 25 08/31/2018 1346   CO2 29 02/14/2014 0818   GLUCOSE 110 (H) 08/31/2018 1346   GLUCOSE 88 02/14/2014 0818   BUN 20 08/31/2018 1346   BUN 12 05/29/2018 1125   BUN 13 02/14/2014 0818   CREATININE 0.85 08/31/2018 1346   CREATININE 0.86 07/28/2018 0802   CALCIUM 8.6 (L) 08/31/2018 1346   CALCIUM 8.1 (L) 02/14/2014 0818   GFRNONAA >60 08/31/2018 1346   GFRNONAA 67 07/28/2018 0802   GFRAA >60 08/31/2018 1346   GFRAA 78 07/28/2018 0802   Lab Results  Component Value Date  HGBA1C 5.8 (H) 07/28/2018   HGBA1C 5.6 05/29/2018   HGBA1C 5.6 01/25/2018   HGBA1C 5.4 12/17/2016   Lab Results  Component Value Date   INSULIN 5.9 05/29/2018   CBC    Component Value Date/Time   WBC 7.6 08/31/2018 1346   RBC 4.41 08/31/2018 1346   HGB 12.1 08/31/2018 1346   HGB 11.9 05/29/2018 1125   HCT 38.4 08/31/2018 1346   HCT 37.4 05/29/2018 1125   PLT 276 08/31/2018 1346   PLT 350 11/21/2015 0842   MCV 87.1 08/31/2018 1346   MCV 85 05/29/2018 1125   MCV 87 02/14/2014 0818   MCH 27.4 08/31/2018 1346   MCHC 31.5 08/31/2018 1346   RDW 14.6 08/31/2018 1346   RDW 14.5 05/29/2018 1125   RDW 14.4 02/14/2014 0818   LYMPHSABS 2.4 05/29/2018 1125   LYMPHSABS 2.1 02/14/2014 0818   MONOABS 455 09/29/2015 1656   MONOABS 0.4 02/14/2014 0818   EOSABS 0.1 05/29/2018 1125   EOSABS 0.1 02/14/2014 0818   BASOSABS 0.0 05/29/2018 1125   BASOSABS 0.0 02/14/2014 0818   Iron/TIBC/Ferritin/ %Sat    Component Value Date/Time   IRON 18 (L) 08/27/2015 0955   TIBC 362 08/27/2015 0955   FERRITIN 17 08/27/2015 0955   IRONPCTSAT 5 (LL) 08/27/2015 0955   Lipid Panel     Component Value Date/Time   CHOL 167 07/28/2018 0802   CHOL 133 05/29/2018 1125   CHOL 188 02/14/2014 0818   TRIG 146 07/28/2018 0802   TRIG 101 02/14/2014 0818   HDL 50 07/28/2018 0802   HDL 60 05/29/2018 1125   HDL 55  02/14/2014 0818   CHOLHDL 3.3 07/28/2018 0802   VLDL 20 02/14/2014 0818   LDLCALC 92 07/28/2018 0802   LDLCALC 113 (H) 02/14/2014 0818   Hepatic Function Panel     Component Value Date/Time   PROT 6.3 07/28/2018 0802   PROT 6.5 05/29/2018 1125   PROT 6.4 02/14/2014 0818   ALBUMIN 4.1 05/29/2018 1125   ALBUMIN 3.3 (L) 02/14/2014 0818   AST 21 07/28/2018 0802   AST 20 02/14/2014 0818   ALT 22 07/28/2018 0802   ALT 24 02/14/2014 0818   ALKPHOS 85 05/29/2018 1125   ALKPHOS 92 02/14/2014 0818   BILITOT 0.4 07/28/2018 0802   BILITOT 0.5 05/29/2018 1125   BILITOT 0.5 02/14/2014 0818      Component Value Date/Time   TSH 2.230 05/29/2018 1125   TSH 1.50 01/25/2018 0811   TSH 2.270 08/27/2015 0955      I, Trixie Dredge, am acting as transcriptionist for Dennard Nip, MD I have reviewed the above documentation for accuracy and completeness, and I agree with the above. -Dennard Nip, MD

## 2018-12-12 DIAGNOSIS — R69 Illness, unspecified: Secondary | ICD-10-CM | POA: Diagnosis not present

## 2018-12-13 ENCOUNTER — Encounter (INDEPENDENT_AMBULATORY_CARE_PROVIDER_SITE_OTHER): Payer: Self-pay | Admitting: Family Medicine

## 2018-12-13 ENCOUNTER — Telehealth (INDEPENDENT_AMBULATORY_CARE_PROVIDER_SITE_OTHER): Payer: Medicare HMO | Admitting: Family Medicine

## 2018-12-13 ENCOUNTER — Other Ambulatory Visit: Payer: Self-pay

## 2018-12-13 DIAGNOSIS — F3289 Other specified depressive episodes: Secondary | ICD-10-CM

## 2018-12-13 DIAGNOSIS — Z6835 Body mass index (BMI) 35.0-35.9, adult: Secondary | ICD-10-CM | POA: Diagnosis not present

## 2018-12-13 DIAGNOSIS — E559 Vitamin D deficiency, unspecified: Secondary | ICD-10-CM

## 2018-12-13 DIAGNOSIS — R69 Illness, unspecified: Secondary | ICD-10-CM | POA: Diagnosis not present

## 2018-12-13 MED ORDER — VITAMIN D (ERGOCALCIFEROL) 1.25 MG (50000 UNIT) PO CAPS: 50000.0000 [IU] | ORAL_CAPSULE | ORAL | 0 refills | Status: DC

## 2018-12-13 MED ORDER — TOPIRAMATE 50 MG PO TABS: 50.0000 mg | ORAL_TABLET | Freq: Every day | ORAL | 0 refills | Status: DC

## 2018-12-15 ENCOUNTER — Other Ambulatory Visit (INDEPENDENT_AMBULATORY_CARE_PROVIDER_SITE_OTHER): Payer: Self-pay | Admitting: Family Medicine

## 2018-12-15 DIAGNOSIS — E559 Vitamin D deficiency, unspecified: Secondary | ICD-10-CM

## 2018-12-19 NOTE — Progress Notes (Addendum)
Office: [phone]  /  Fax: [phone] TeleHealth Visit:  Kelly Weber has verbally consented to this TeleHealth visit today. The patient is located at home, the provider is located at the [facility] office. The participants in this visit include the listed provider and patient. The visit was conducted today via telephone call. I spent > than 50% of the 25 minute visit on counseling as documented in the note.   HPI:   Chief Complaint: OBESITY Kelly Weber is here to discuss her progress with her obesity treatment plan. She is on the Category 2 plan and is following her eating plan approximately 80% of the time. She states she is walking around the mall 2-3 times per week. Kelly Weber feels she is doing well with weight loss and notes her clothes and rings are fitting looser. She is following her Category 2 plan well but sometimes skips meals. We were unable to weigh the patient today for this TeleHealth visit. She feels as if she has lost weight since her last visit. She has lost 6 lbs since starting treatment with [place].  Vitamin D deficiency Kelly Weber has a diagnosis of Vitamin D deficiency. She is currently stable on prescription Vit D and denies nausea, vomiting or muscle weakness.  Depression with emotional eating behaviors Kelly Weber is struggling with emotional eating and using food for comfort to the extent that it is negatively impacting her health. She often snacks when she is not hungry. Kelly Weber sometimes feels she is out of control and then feels guilty that she made poor food choices. She has been working on behavior modification techniques to help reduce her emotional eating and has been somewhat successful. Kelly Weber started Topamax and feels she is doing better with decreased emotional eating and is sleeping a bit better too. She asks if she can increase her dose to help a bit more. She shows no sign of suicidal or homicidal ideations.  Depression screen Kelly Weber 2/9 08/04/2018 05/29/2018 02/01/2018  10/26/2017 Weber  Decreased Interest 0 1 0 2 1  Down, Depressed, Hopeless 0 1 0 2 1  PHQ - 2 Score 0 2 0 4 2  Altered sleeping 0 0 0 0 0  Tired, decreased energy 1 3 0 0 1  Change in appetite 0 1 0 2 2  Feeling bad or failure about yourself  0 1 0 1 1  Trouble concentrating 0 0 0 1 0  Moving slowly or fidgety/restless 0 0 0 1 0  Suicidal thoughts 0 0 0 0 0  PHQ-9 Score 1 7 0 9 6  Difficult doing work/chores Not difficult at all Not difficult at all Not difficult at all Somewhat difficult Not difficult at all  Some recent data might be hidden   ASSESSMENT AND PLAN:  Class 2 severe obesity with serious comorbidity and body mass index (BMI) of 35.0 to 35.9 in adult, unspecified obesity type ([facility])  Other depression - Plan: topiramate (TOPAMAX) 50 MG tablet  Vitamin D deficiency - Plan: Vitamin D, Ergocalciferol, (DRISDOL) 1.25 MG (50000 UT) CAPS capsule  PLAN:  Vitamin D Deficiency Kelly Weber was informed that low Vitamin D levels contributes to fatigue and are associated with obesity, breast, and colon cancer. She agrees to continue to take prescription Vit D @ 50,000 IU every week #4 with 0 refills and will follow-up for routine testing of Vitamin D, at least 2-3 times per year. She was informed of the risk of over-replacement of Vitamin D and agrees to not increase her  dose unless she discusses this with [place] first. Kelly Weber agrees to follow-up with our clinic in 3 weeks.  Depression with Emotional Eating Behaviors We discussed behavior modification techniques today to help Kelly Weber deal with her emotional eating and depression. Kelly Weber will increase her Topamax to 50 mg qd and will continue Effexor. She will follow-up with our clinic in 3 weeks to monitor her progress.  Obesity Liisa is currently in the action stage of change. As such, her goal is to continue with weight loss efforts. She has agreed to follow the Category 2 plan. Cacee has been instructed to work up to a goal of 150 minutes of  combined cardio and strengthening exercise per week for weight loss and overall health benefits. We discussed the following Behavioral Modification Strategies today: increasing lean protein intake, work on meal planning and easy cooking plans.  Chanay has agreed to follow-up with our clinic in 3 weeks. She was informed of the importance of frequent follow-up visits to maximize her success with intensive lifestyle modifications for her multiple health conditions.  ALLERGIES: Allergies  Allergen Reactions  . Penicillins Hives and Swelling    MEDICATIONS: Current Outpatient Medications on File Prior to Visit  Medication Sig Dispense Refill  . Calcium Carb-Cholecalciferol (GNP CALCIUM 600 +D3) 600-800 MG-UNIT TABS Take 1 tablet by mouth daily.     . fluticasone (FLONASE) 50 MCG/ACT nasal spray PLACE 2 SPRAYS INTO BOTH NOSTRILS DAILY. USE FOR 4-6 WEEKS THEN STOP AND USE SEASONALLY OR AS NEEDED 48 g 1  . Multiple Vitamin (MULTI-VITAMINS) TABS Take 1 tablet by mouth daily.     . Skin Protectants, Misc. (EUCERIN) cream Apply topically as needed for dry skin.    . valACYclovir (VALTREX) 1000 MG tablet Take 2,000 mg by mouth 2 (two) times daily as needed (fever blisters / rash).     . venlafaxine XR (EFFEXOR-XR) 37.5 MG 24 hr capsule Take 1 capsule (37.5 mg total) by mouth daily with breakfast. 90 capsule 1   No current facility-administered medications on file prior to visit.     PAST MEDICAL HISTORY: Past Medical History:  Diagnosis Date  . Allergies   . Anxiety   . Arthritis    fingers, left knee  . Depression   . Diverticulitis   . Dyspnea   . Fatigue   . GERD (gastroesophageal reflux disease)   . HLD (hyperlipidemia)   . Insomnia 2006  . Lower back pain   . Skin cancer 2007   Basal Cell CA resected from Left middle finger    PAST SURGICAL HISTORY: Past Surgical History:  Procedure Laterality Date  . BREAST CYST ASPIRATION    . COLONOSCOPY WITH PROPOFOL N/A 11/20/2014    Procedure: COLONOSCOPY WITH PROPOFOL;  Surgeon: Lucilla Lame, MD;  Location: [place];  Service: Endoscopy;  Laterality: N/A;  . ESOPHAGOGASTRODUODENOSCOPY (EGD) WITH PROPOFOL N/A 11/20/2014   Procedure: ESOPHAGOGASTRODUODENOSCOPY (EGD) WITH PROPOFOL;  Surgeon: Lucilla Lame, MD;  Location: [place];  Service: Endoscopy;  Laterality: N/A;  . POLYPECTOMY  11/20/2014   Procedure: POLYPECTOMY;  Surgeon: Lucilla Lame, MD;  Location: [place];  Service: Endoscopy;;  . SKIN CANCER EXCISION Left    finger  . TUBAL LIGATION  1970    SOCIAL HISTORY: Social History   Tobacco Use  . Smoking status: Former Smoker    Packs/day: 1.00    Years: 30.00    Pack years: 30.00    Types: Cigarettes    Quit date: 01/19/2014  Years since quitting: 4.9  . Smokeless tobacco: Former Network engineer Use Topics  . Alcohol use: No    Alcohol/week: 0.0 standard drinks  . Drug use: No    FAMILY HISTORY: Family History  Problem Relation Age of Onset  . Brain cancer Mother   . Thyroid disease Mother   . Hypertension Mother   . Breast cancer Maternal Aunt 26  . Breast cancer Cousin 40  . Breast cancer Maternal Aunt    ROS: Review of Systems  Gastrointestinal: Negative for nausea and vomiting.  Musculoskeletal:       Negative for muscle weakness.  Psychiatric/Behavioral: Positive for depression. Negative for suicidal ideas.       Negative for homicidal ideas.   PHYSICAL EXAM: Pt in no acute distress  RECENT LABS AND TESTS: BMET    Component Value Date/Time   NA 137 08/31/2018 1346   NA 141 05/29/2018 1125   NA 141 02/14/2014 0818   K 3.8 08/31/2018 1346   K 4.2 02/14/2014 0818   CL 102 08/31/2018 1346   CL 107 02/14/2014 0818   CO2 25 08/31/2018 1346   CO2 29 02/14/2014 0818   GLUCOSE 110 (H) 08/31/2018 1346   GLUCOSE 88 02/14/2014 0818   BUN 20 08/31/2018 1346   BUN 12 05/29/2018 1125   BUN 13 02/14/2014 0818   CREATININE 0.85 08/31/2018 1346   CREATININE  0.86 07/28/2018 0802   CALCIUM 8.6 (L) 08/31/2018 1346   CALCIUM 8.1 (L) 02/14/2014 0818   GFRNONAA >60 08/31/2018 1346   GFRNONAA 67 07/28/2018 0802   GFRAA >60 08/31/2018 1346   GFRAA 78 07/28/2018 0802   Lab Results  Component Value Date   HGBA1C 5.8 (H) 07/28/2018   HGBA1C 5.6 05/29/2018   HGBA1C 5.6 01/25/2018   HGBA1C 5.4 12/17/2016   Lab Results  Component Value Date   INSULIN 5.9 05/29/2018   CBC    Component Value Date/Time   WBC 7.6 08/31/2018 1346   RBC 4.41 08/31/2018 1346   HGB 12.1 08/31/2018 1346   HGB 11.9 05/29/2018 1125   HCT 38.4 08/31/2018 1346   HCT 37.4 05/29/2018 1125   PLT 276 08/31/2018 1346   PLT 350 11/21/2015 0842   MCV 87.1 08/31/2018 1346   MCV 85 05/29/2018 1125   MCV 87 02/14/2014 0818   MCH 27.4 08/31/2018 1346   MCHC 31.5 08/31/2018 1346   RDW 14.6 08/31/2018 1346   RDW 14.5 05/29/2018 1125   RDW 14.4 02/14/2014 0818   LYMPHSABS 2.4 05/29/2018 1125   LYMPHSABS 2.1 02/14/2014 0818   MONOABS 455 09/29/2015 1656   MONOABS 0.4 02/14/2014 0818   EOSABS 0.1 05/29/2018 1125   EOSABS 0.1 02/14/2014 0818   BASOSABS 0.0 05/29/2018 1125   BASOSABS 0.0 02/14/2014 0818   Iron/TIBC/Ferritin/ %Sat    Component Value Date/Time   IRON 18 (L) 08/27/2015 0955   TIBC 362 08/27/2015 0955   FERRITIN 17 08/27/2015 0955   IRONPCTSAT 5 (LL) 08/27/2015 0955   Lipid Panel     Component Value Date/Time   CHOL 167 07/28/2018 0802   CHOL 133 05/29/2018 1125   CHOL 188 02/14/2014 0818   TRIG 146 07/28/2018 0802   TRIG 101 02/14/2014 0818   HDL 50 07/28/2018 0802   HDL 60 05/29/2018 1125   HDL 55 02/14/2014 0818   CHOLHDL 3.3 07/28/2018 0802   VLDL 20 02/14/2014 0818   LDLCALC 92 07/28/2018 0802   LDLCALC 113 (H) 02/14/2014 0818   Hepatic  Function Panel     Component Value Date/Time   PROT 6.3 07/28/2018 0802   PROT 6.5 05/29/2018 1125   PROT 6.4 02/14/2014 0818   ALBUMIN 4.1 05/29/2018 1125   ALBUMIN 3.3 (L) 02/14/2014 0818   AST 21  07/28/2018 0802   AST 20 02/14/2014 0818   ALT 22 07/28/2018 0802   ALT 24 02/14/2014 0818   ALKPHOS 85 05/29/2018 1125   ALKPHOS 92 02/14/2014 0818   BILITOT 0.4 07/28/2018 0802   BILITOT 0.5 05/29/2018 1125   BILITOT 0.5 02/14/2014 0818      Component Value Date/Time   TSH 2.230 05/29/2018 1125   TSH 1.50 01/25/2018 0811   TSH 2.270 08/27/2015 0955   Results for MCKENLEY, GAUVIN (MRN [id number]) as of 12/19/2018 07:03  Ref. Range 05/29/2018 11:25  Vitamin D, 25-Hydroxy Latest Ref Range: 30.0 - 100.0 ng/mL 34.8   I, Michaelene Song, am acting as Location manager for Dennard Nip, MD I have reviewed the above documentation for accuracy and completeness, and I agree with the above. -Dennard Nip, MD

## 2018-12-27 ENCOUNTER — Telehealth (INDEPENDENT_AMBULATORY_CARE_PROVIDER_SITE_OTHER): Payer: Medicare HMO | Admitting: Family Medicine

## 2018-12-27 ENCOUNTER — Encounter (INDEPENDENT_AMBULATORY_CARE_PROVIDER_SITE_OTHER): Payer: Self-pay | Admitting: Family Medicine

## 2018-12-27 ENCOUNTER — Other Ambulatory Visit: Payer: Self-pay

## 2018-12-27 DIAGNOSIS — Z6835 Body mass index (BMI) 35.0-35.9, adult: Secondary | ICD-10-CM | POA: Diagnosis not present

## 2018-12-27 DIAGNOSIS — R519 Headache, unspecified: Secondary | ICD-10-CM

## 2018-12-27 DIAGNOSIS — R51 Headache: Secondary | ICD-10-CM | POA: Diagnosis not present

## 2018-12-27 MED ORDER — TOPIRAMATE 50 MG PO TABS: 50.0000 mg | ORAL_TABLET | Freq: Every day | ORAL | 0 refills | Status: DC

## 2019-01-01 NOTE — Progress Notes (Signed)
Office: [phone]  /  Fax: [phone] TeleHealth Visit:  Kelly Weber has verbally consented to this TeleHealth visit today. The patient is located at home, the provider is located at the [facility] office. The participants in this visit include the listed provider and patient. Kelly Weber was unable to use realtime audiovisual technology today and the telehealth visit was conducted via telephone.   HPI:   Chief Complaint: OBESITY Kelly Weber is here to discuss her progress with her obesity treatment plan. She is on the Category 2 plan and is following her eating plan approximately 75 % of the time. She states she is exercising 0 minutes 0 times per week. Kelly Weber has maintained her weight on her Category 2 plan. She is deviating more for dinner and hasn't been drinking much water. She has not been exercising lately.  We were unable to weigh the patient today for this TeleHealth visit. She feels as if she has maintained her weight since her last visit. She has lost 6 lbs since starting treatment with [place].  Headaches Kelly Weber notes increased frontal headaches in the morning, mild and throbbing. She states it has been worse in the last week and seems to improve with increased caffeine. She is not drinking much water.  ASSESSMENT AND PLAN:  Nonintractable episodic headache, unspecified headache type - Plan: topiramate (TOPAMAX) 50 MG tablet  Class 2 severe obesity with serious comorbidity and body mass index (BMI) of 35.0 to 35.9 in adult, unspecified obesity type ([place])  PLAN:  Headaches Kelly Weber agrees to continue taking Topamax 50 mg PO qhs #30 and we will refill for 1 month. She is to increase H20 intake and will continue to monitor. She was advised to this may be due to Fall allergies, which here has increased recently. Kelly Weber agrees to follow up with our clinic in 3 weeks.  I spent > than 50% of the 15 minute visit on counseling as documented in the note.  Obesity Kelly Weber is currently in  the action stage of change. As such, her goal is to continue with weight loss efforts She has agreed to follow the Category 2 plan Kelly Weber has been instructed to work up to a goal of 150 minutes of combined cardio and strengthening exercise per week for weight loss and overall health benefits. We discussed the following Behavioral Modification Strategies today: increase H20 intake, work on meal planning and easy cooking plans, and dealing with family or coworker sabotage   Kelly Weber has agreed to follow up with our clinic in 3 weeks. She was informed of the importance of frequent follow up visits to maximize her success with intensive lifestyle modifications for her multiple health conditions.  ALLERGIES: Allergies  Allergen Reactions  . Penicillins Hives and Swelling    MEDICATIONS: Current Outpatient Medications on File Prior to Visit  Medication Sig Dispense Refill  . Calcium Carb-Cholecalciferol (GNP CALCIUM 600 +D3) 600-800 MG-UNIT TABS Take 1 tablet by mouth daily.     . fluticasone (FLONASE) 50 MCG/ACT nasal spray PLACE 2 SPRAYS INTO BOTH NOSTRILS DAILY. USE FOR 4-6 WEEKS THEN STOP AND USE SEASONALLY OR AS NEEDED 48 g 1  . Multiple Vitamin (MULTI-VITAMINS) TABS Take 1 tablet by mouth daily.     . Skin Protectants, Misc. (EUCERIN) cream Apply topically as needed for dry skin.    . valACYclovir (VALTREX) 1000 MG tablet Take 2,000 mg by mouth 2 (two) times daily as needed (fever blisters / rash).     . venlafaxine XR (EFFEXOR-XR)  37.5 MG 24 hr capsule Take 1 capsule (37.5 mg total) by mouth daily with breakfast. 90 capsule 1  . Vitamin D, Ergocalciferol, (DRISDOL) 1.25 MG (50000 UT) CAPS capsule Take 1 capsule (50,000 Units total) by mouth every 7 (seven) days. 4 capsule 0   No current facility-administered medications on file prior to visit.     PAST MEDICAL HISTORY: Past Medical History:  Diagnosis Date  . Allergies   . Anxiety   . Arthritis    fingers, left knee  . Depression   .  Diverticulitis   . Dyspnea   . Fatigue   . GERD (gastroesophageal reflux disease)   . HLD (hyperlipidemia)   . Insomnia 2006  . Lower back pain   . Skin cancer 2007   Basal Cell CA resected from Left middle finger    PAST SURGICAL HISTORY: Past Surgical History:  Procedure Laterality Date  . BREAST CYST ASPIRATION    . COLONOSCOPY WITH PROPOFOL N/A 11/20/2014   Procedure: COLONOSCOPY WITH PROPOFOL;  Surgeon: Lucilla Lame, MD;  Location: [place];  Service: Endoscopy;  Laterality: N/A;  . ESOPHAGOGASTRODUODENOSCOPY (EGD) WITH PROPOFOL N/A 11/20/2014   Procedure: ESOPHAGOGASTRODUODENOSCOPY (EGD) WITH PROPOFOL;  Surgeon: Lucilla Lame, MD;  Location: [place];  Service: Endoscopy;  Laterality: N/A;  . POLYPECTOMY  11/20/2014   Procedure: POLYPECTOMY;  Surgeon: Lucilla Lame, MD;  Location: [facility] [employer];  Service: Endoscopy;;  . SKIN CANCER EXCISION Left    finger  . TUBAL LIGATION  1970    SOCIAL HISTORY: Social History   Tobacco Use  . Smoking status: Former Smoker    Packs/day: 1.00    Years: 30.00    Pack years: 30.00    Types: Cigarettes    Quit date: 01/19/2014    Years since quitting: 4.9  . Smokeless tobacco: Former Network engineer Use Topics  . Alcohol use: No    Alcohol/week: 0.0 standard drinks  . Drug use: No    FAMILY HISTORY: Family History  Problem Relation Age of Onset  . Brain cancer Mother   . Thyroid disease Mother   . Hypertension Mother   . Breast cancer Maternal Aunt 26  . Breast cancer Cousin 9  . Breast cancer Maternal Aunt     ROS: Review of Systems  Constitutional: Negative for weight loss.  Neurological: Positive for headaches.    PHYSICAL EXAM: Pt in no acute distress  RECENT LABS AND TESTS: BMET    Component Value Date/Time   NA 137 08/31/2018 1346   NA 141 05/29/2018 1125   NA 141 02/14/2014 0818   K 3.8 08/31/2018 1346   K 4.2 02/14/2014 0818   CL 102 08/31/2018 1346   CL 107 02/14/2014 0818    CO2 25 08/31/2018 1346   CO2 29 02/14/2014 0818   GLUCOSE 110 (H) 08/31/2018 1346   GLUCOSE 88 02/14/2014 0818   BUN 20 08/31/2018 1346   BUN 12 05/29/2018 1125   BUN 13 02/14/2014 0818   CREATININE 0.85 08/31/2018 1346   CREATININE 0.86 07/28/2018 0802   CALCIUM 8.6 (L) 08/31/2018 1346   CALCIUM 8.1 (L) 02/14/2014 0818   GFRNONAA >60 08/31/2018 1346   GFRNONAA 67 07/28/2018 0802   GFRAA >60 08/31/2018 1346   GFRAA 78 07/28/2018 0802   Lab Results  Component Value Date   HGBA1C 5.8 (H) 07/28/2018   HGBA1C 5.6 05/29/2018   HGBA1C 5.6 01/25/2018   HGBA1C 5.4 12/17/2016   Lab Results  Component Value Date  INSULIN 5.9 05/29/2018   CBC    Component Value Date/Time   WBC 7.6 08/31/2018 1346   RBC 4.41 08/31/2018 1346   HGB 12.1 08/31/2018 1346   HGB 11.9 05/29/2018 1125   HCT 38.4 08/31/2018 1346   HCT 37.4 05/29/2018 1125   PLT 276 08/31/2018 1346   PLT 350 11/21/2015 0842   MCV 87.1 08/31/2018 1346   MCV 85 05/29/2018 1125   MCV 87 02/14/2014 0818   MCH 27.4 08/31/2018 1346   MCHC 31.5 08/31/2018 1346   RDW 14.6 08/31/2018 1346   RDW 14.5 05/29/2018 1125   RDW 14.4 02/14/2014 0818   LYMPHSABS 2.4 05/29/2018 1125   LYMPHSABS 2.1 02/14/2014 0818   MONOABS 455 09/29/2015 1656   MONOABS 0.4 02/14/2014 0818   EOSABS 0.1 05/29/2018 1125   EOSABS 0.1 02/14/2014 0818   BASOSABS 0.0 05/29/2018 1125   BASOSABS 0.0 02/14/2014 0818   Iron/TIBC/Ferritin/ %Sat    Component Value Date/Time   IRON 18 (L) 08/27/2015 0955   TIBC 362 08/27/2015 0955   FERRITIN 17 08/27/2015 0955   IRONPCTSAT 5 (LL) 08/27/2015 0955   Lipid Panel     Component Value Date/Time   CHOL 167 07/28/2018 0802   CHOL 133 05/29/2018 1125   CHOL 188 02/14/2014 0818   TRIG 146 07/28/2018 0802   TRIG 101 02/14/2014 0818   HDL 50 07/28/2018 0802   HDL 60 05/29/2018 1125   HDL 55 02/14/2014 0818   CHOLHDL 3.3 07/28/2018 0802   VLDL 20 02/14/2014 0818   LDLCALC 92 07/28/2018 0802   LDLCALC 113  (H) 02/14/2014 0818   Hepatic Function Panel     Component Value Date/Time   PROT 6.3 07/28/2018 0802   PROT 6.5 05/29/2018 1125   PROT 6.4 02/14/2014 0818   ALBUMIN 4.1 05/29/2018 1125   ALBUMIN 3.3 (L) 02/14/2014 0818   AST 21 07/28/2018 0802   AST 20 02/14/2014 0818   ALT 22 07/28/2018 0802   ALT 24 02/14/2014 0818   ALKPHOS 85 05/29/2018 1125   ALKPHOS 92 02/14/2014 0818   BILITOT 0.4 07/28/2018 0802   BILITOT 0.5 05/29/2018 1125   BILITOT 0.5 02/14/2014 0818      Component Value Date/Time   TSH 2.230 05/29/2018 1125   TSH 1.50 01/25/2018 0811   TSH 2.270 08/27/2015 0955      I, Trixie Dredge, am acting as transcriptionist for Dennard Nip, MD I have reviewed the above documentation for accuracy and completeness, and I agree with the above. -Dennard Nip, MD

## 2019-01-05 ENCOUNTER — Other Ambulatory Visit (INDEPENDENT_AMBULATORY_CARE_PROVIDER_SITE_OTHER): Payer: Self-pay | Admitting: Family Medicine

## 2019-01-05 DIAGNOSIS — E559 Vitamin D deficiency, unspecified: Secondary | ICD-10-CM

## 2019-01-05 DIAGNOSIS — R519 Headache, unspecified: Secondary | ICD-10-CM

## 2019-01-17 ENCOUNTER — Encounter (INDEPENDENT_AMBULATORY_CARE_PROVIDER_SITE_OTHER): Payer: Self-pay

## 2019-01-17 ENCOUNTER — Telehealth (INDEPENDENT_AMBULATORY_CARE_PROVIDER_SITE_OTHER): Payer: Medicare HMO | Admitting: Family Medicine

## 2019-01-23 ENCOUNTER — Telehealth: Payer: Self-pay

## 2019-01-23 ENCOUNTER — Other Ambulatory Visit (INDEPENDENT_AMBULATORY_CARE_PROVIDER_SITE_OTHER): Payer: Self-pay | Admitting: Family Medicine

## 2019-01-23 DIAGNOSIS — D508 Other iron deficiency anemias: Secondary | ICD-10-CM

## 2019-01-23 DIAGNOSIS — R519 Headache, unspecified: Secondary | ICD-10-CM

## 2019-01-23 DIAGNOSIS — R7309 Other abnormal glucose: Secondary | ICD-10-CM

## 2019-01-23 DIAGNOSIS — E7800 Pure hypercholesterolemia, unspecified: Secondary | ICD-10-CM

## 2019-01-23 NOTE — Telephone Encounter (Signed)
Labs ordered.

## 2019-01-24 ENCOUNTER — Other Ambulatory Visit: Payer: Medicare HMO

## 2019-01-24 DIAGNOSIS — D508 Other iron deficiency anemias: Secondary | ICD-10-CM | POA: Diagnosis not present

## 2019-01-24 DIAGNOSIS — R7309 Other abnormal glucose: Secondary | ICD-10-CM | POA: Diagnosis not present

## 2019-01-24 DIAGNOSIS — E7800 Pure hypercholesterolemia, unspecified: Secondary | ICD-10-CM | POA: Diagnosis not present

## 2019-01-24 LAB — CBC WITH DIFFERENTIAL/PLATELET
Absolute Monocytes: 282 cells/uL (ref 200–950)
Basophils Absolute: 40 cells/uL (ref 0–200)
Basophils Relative: 0.9 %
Eosinophils Absolute: 101 cells/uL (ref 15–500)
Eosinophils Relative: 2.3 %
HCT: 38.1 % (ref 35.0–45.0)
Hemoglobin: 12.2 g/dL (ref 11.7–15.5)
Lymphs Abs: 1830 cells/uL (ref 850–3900)
MCH: 27.7 pg (ref 27.0–33.0)
MCHC: 32 g/dL (ref 32.0–36.0)
MCV: 86.6 fL (ref 80.0–100.0)
MPV: 10.5 fL (ref 7.5–12.5)
Monocytes Relative: 6.4 %
Neutro Abs: 2147 cells/uL (ref 1500–7800)
Neutrophils Relative %: 48.8 %
Platelets: 286 10*3/uL (ref 140–400)
RBC: 4.4 10*6/uL (ref 3.80–5.10)
RDW: 13.3 % (ref 11.0–15.0)
Total Lymphocyte: 41.6 %
WBC: 4.4 10*3/uL (ref 3.8–10.8)

## 2019-01-24 LAB — LIPID PANEL
Cholesterol: 209 mg/dL — ABNORMAL HIGH (ref <200)
HDL: 52 mg/dL (ref >=50)
LDL Cholesterol (Calc): 131 mg/dL (calc) — ABNORMAL HIGH
Non-HDL Cholesterol (Calc): 157 mg/dL (calc) — ABNORMAL HIGH (ref <130)
Total CHOL/HDL Ratio: 4 (calc) (ref <5.0)
Triglycerides: 148 mg/dL (ref <150)

## 2019-01-24 LAB — COMPREHENSIVE METABOLIC PANEL
AG Ratio: 1.7 (calc) (ref 1.0–2.5)
ALT: 9 U/L (ref 6–29)
AST: 14 U/L (ref 10–35)
Albumin: 3.8 g/dL (ref 3.6–5.1)
Alkaline phosphatase (APISO): 78 U/L (ref 37–153)
BUN: 16 mg/dL (ref 7–25)
CO2: 29 mmol/L (ref 20–32)
Calcium: 8.7 mg/dL (ref 8.6–10.4)
Chloride: 104 mmol/L (ref 98–110)
Creat: 0.81 mg/dL (ref 0.60–0.93)
Globulin: 2.3 g/dL (calc) (ref 1.9–3.7)
Glucose, Bld: 96 mg/dL (ref 65–99)
Potassium: 4.2 mmol/L (ref 3.5–5.3)
Sodium: 140 mmol/L (ref 135–146)
Total Bilirubin: 0.4 mg/dL (ref 0.2–1.2)
Total Protein: 6.1 g/dL (ref 6.1–8.1)

## 2019-01-29 ENCOUNTER — Ambulatory Visit (INDEPENDENT_AMBULATORY_CARE_PROVIDER_SITE_OTHER): Payer: Medicare HMO | Admitting: Family Medicine

## 2019-01-29 ENCOUNTER — Encounter: Payer: Self-pay | Admitting: Family Medicine

## 2019-01-29 ENCOUNTER — Other Ambulatory Visit: Payer: Self-pay

## 2019-01-29 VITALS — BP 112/76 | HR 107 | Temp 98.4°F | Resp 16 | Ht 64.0 in | Wt 211.8 lb

## 2019-01-29 DIAGNOSIS — Z23 Encounter for immunization: Secondary | ICD-10-CM

## 2019-01-29 DIAGNOSIS — R7309 Other abnormal glucose: Secondary | ICD-10-CM

## 2019-01-29 DIAGNOSIS — F3342 Major depressive disorder, recurrent, in full remission: Secondary | ICD-10-CM | POA: Diagnosis not present

## 2019-01-29 DIAGNOSIS — R69 Illness, unspecified: Secondary | ICD-10-CM | POA: Diagnosis not present

## 2019-01-29 LAB — POCT GLYCOSYLATED HEMOGLOBIN (HGB A1C): Hemoglobin A1C: 5.7 % — AB (ref 4.0–5.6)

## 2019-01-29 MED ORDER — VENLAFAXINE HCL ER 37.5 MG PO CP24: 37.5000 mg | ORAL_CAPSULE | Freq: Every day | ORAL | 1 refills | Status: DC

## 2019-01-29 NOTE — Patient Instructions (Addendum)
Thank you for coming to the office today.  Vitamin D3 2,000 unit once daily - OTC You can also take Calcium 500 to 1000 daily - OTC TUMs can work as a supplement.  Recent Labs    05/29/18 1125 07/28/18 0802  HGBA1C 5.6 5.8*   Flu Shot today  For Mood/Anxiety medication - Venlafaxine 37.5mg  once daily - if you are interested in DOUBLING dose taking 2 pills = 75mg  (two pills at once) same time every day for 1-2 weeks to see if this higher dose is effective - let me know before you are ready to renew this one if you like the higher dose.    Please schedule a Follow-up Appointment to: Return in about 6 months (around 07/30/2019) for 6 months PreDM A1c, Weight, Mood/Anxiety PHQ/GAD.  If you have any other questions or concerns, please feel free to call the office or send a message through [place]. You may also schedule an earlier appointment if necessary.  Additionally, you may be receiving a survey about your experience at our office within a few days to 1 week by e-mail or mail. We value your feedback.  Nobie Putnam, DO [place]

## 2019-01-29 NOTE — Progress Notes (Signed)
Subjective:    Patient ID: Kelly Weber, female    DOB: 1945-05-17, 73 y.o.   MRN: [id number]  Kelly Weber is a 73 y.o. female presenting on 01/29/2019 for Pre-Diabetes and Depression   HPI   FOLLOW-UP INSOMNIA /Depression, Major, Chronic recurrent Last visit 07/2018 virtually. Doing well Venlafaxine 37.5mg  daily. Has remained off other medications such as Ambien, Trazodone, Lexapro, Zoloft - She is not endorsing any other significant related symptoms - Considering Venlafaxine higher dose but prefers to maintain current dose now, due for refill See scores below  Pre-Diabetes Prior trend A1c 5.6 to 5.8, last result 07/2018. Last lab A1c was not collected due to order. - Today patient reports still improving lifestyle, diet and wt loss. Gained some weight recently after losing. Previously [facility] - Offered rx Rosuvastatin, caused side effect, felt "drunk" and she stopped - Also they offered Topiramate to help weight but she did not take this due to concerns. - Weight down to 202 lbs recently. Now weight gained  Health Maintenance: Due for Flu Shot, will receive today    Depression screen [facility] 2/9 01/29/2019 08/04/2018 05/29/2018  Decreased Interest 1 0 1  Down, Depressed, Hopeless 0 0 1  PHQ - 2 Score 1 0 2  Altered sleeping 1 0 0  Tired, decreased energy 1 1 3   Change in appetite 0 0 1  Feeling bad or failure about yourself  0 0 1  Trouble concentrating 0 0 0  Moving slowly or fidgety/restless 0 0 0  Suicidal thoughts 0 0 0  PHQ-9 Score 3 1 7   Difficult doing work/chores Not difficult at all Not difficult at all Not difficult at all  Some recent data might be hidden   GAD 7 : Generalized Anxiety Score 01/29/2019 08/04/2018 10/26/2017  Nervous, Anxious, on Edge 1 0 1  Control/stop worrying 0 0 1  Worry too much - different things 0 0 1  Trouble relaxing 0 0 2  Restless 0 0 2  Easily annoyed or irritable 1 1 2   Afraid - awful might happen 0 0 1  Total GAD  7 Score 2 1 10   Anxiety Difficulty Not difficult at all Not difficult at all Not difficult at all     Social History   Tobacco Use  . Smoking status: Former Smoker    Packs/day: 1.00    Years: 30.00    Pack years: 30.00    Types: Cigarettes    Quit date: 01/19/2014    Years since quitting: 5.0  . Smokeless tobacco: Former Network engineer Use Topics  . Alcohol use: No    Alcohol/week: 0.0 standard drinks  . Drug use: No    Review of Systems Per HPI unless specifically indicated above     Objective:    BP 112/76   Pulse (!) 107   Temp 98.4 F (36.9 C) (Oral)   Resp 16   Ht 5\' 4"  (1.626 m)   Wt 211 lb 12.8 oz (96.1 kg)   SpO2 98%   BMI 36.36 kg/m   Wt Readings from Last 3 Encounters:  01/29/19 211 lb 12.8 oz (96.1 kg)  08/31/18 207 lb (93.9 kg)  07/03/18 208 lb (94.3 kg)    Physical Exam Vitals signs and nursing note reviewed.  Constitutional:      General: She is not in acute distress.    Appearance: She is well-developed. She is not diaphoretic.     Comments: Well-appearing, comfortable, cooperative  HENT:  Head: Normocephalic and atraumatic.  Eyes:     General:        Right eye: No discharge.        Left eye: No discharge.     Conjunctiva/sclera: Conjunctivae normal.  Neck:     Musculoskeletal: Normal range of motion and neck supple.     Thyroid: No thyromegaly.  Cardiovascular:     Rate and Rhythm: Normal rate and regular rhythm.     Heart sounds: Normal heart sounds. No murmur.  Pulmonary:     Effort: Pulmonary effort is normal. No respiratory distress.     Breath sounds: Normal breath sounds. No wheezing or rales.  Musculoskeletal: Normal range of motion.  Lymphadenopathy:     Cervical: No cervical adenopathy.  Skin:    General: Skin is warm and dry.     Findings: No erythema or rash.  Neurological:     Mental Status: She is alert and oriented to person, place, and time.  Psychiatric:        Behavior: Behavior normal.     Comments: Well  groomed, good eye contact, normal speech and thoughts    Results for orders placed or performed in visit on 01/29/19  POCT glycosylated hemoglobin (Hb A1C)  Result Value Ref Range   Hemoglobin A1C 5.7 (A) 4.0 - 5.6 %      Assessment & Plan:   Problem List Items Addressed This Visit    Major depression, recurrent, full remission ([place]) - Primary    In remission, controlled on SNRI Prior family stressors [place] insomnia, chronic - improved Prior Meds: Sertraline 25mg , Escitalopram 20mg , Ambien, Trazodone 50mg  - No prior Psych / counseling, has church support and friends  Plan: 1. Continue Venlafaxine 37.5mg  daily current dose now - consider future dose increase may inc on her own if ready to 75mg  in future, notify office - Offered counseling Follow-up      Relevant Medications   venlafaxine XR (EFFEXOR-XR) 37.5 MG 24 hr capsule   Elevated hemoglobin A1c    Improved lifestyle, some wt loss A1c down to 5.7, improved Encourage continue lifestyle improvement overall No medication Follow-up q 6 mo as planned      Relevant Orders   POCT glycosylated hemoglobin (Hb A1C) (Completed)    Other Visit Diagnoses    Needs flu shot       Relevant Orders   Flu Vaccine QUAD High Dose(Fluad) (Completed)      Meds ordered this encounter  Medications  . venlafaxine XR (EFFEXOR-XR) 37.5 MG 24 hr capsule    Sig: Take 1 capsule (37.5 mg total) by mouth daily with breakfast.    Dispense:  90 capsule    Refill:  1    Follow up plan: Return in about 6 months (around 07/30/2019) for 6 months PreDM A1c, Weight, Mood/Anxiety PHQ/GAD.  Nobie Putnam, DO [facility] 01/29/2019, 1:43 PM

## 2019-01-30 DIAGNOSIS — F339 Major depressive disorder, recurrent, unspecified: Secondary | ICD-10-CM | POA: Insufficient documentation

## 2019-01-30 NOTE — Assessment & Plan Note (Addendum)
Improved lifestyle, some wt loss A1c down to 5.7, improved Encourage continue lifestyle improvement overall No medication Follow-up q 6 mo as planned

## 2019-01-30 NOTE — Assessment & Plan Note (Signed)
In remission, controlled on SNRI Prior family stressors [place] insomnia, chronic - improved Prior Meds: Sertraline 25mg, Escitalopram 20mg, Ambien, Trazodone 50mg - No prior Psych / counseling, has church support and friends  Plan: 1. Continue Venlafaxine 37.5mg daily current dose now - consider future dose increase may inc on her own if ready to 75mg in future, notify office - Offered counseling Follow-up 

## 2019-02-20 DIAGNOSIS — R69 Illness, unspecified: Secondary | ICD-10-CM | POA: Diagnosis not present

## 2019-04-03 ENCOUNTER — Other Ambulatory Visit: Payer: Self-pay | Admitting: Family Medicine

## 2019-04-03 DIAGNOSIS — R69 Illness, unspecified: Secondary | ICD-10-CM | POA: Diagnosis not present

## 2019-04-03 DIAGNOSIS — F3342 Major depressive disorder, recurrent, in full remission: Secondary | ICD-10-CM

## 2019-04-03 NOTE — Telephone Encounter (Signed)
Pt  Called requesting refill on  Venlafaxine  37.5 MG 1 time a day for  90 day  Supply at  [facility]

## 2019-04-09 MED ORDER — VENLAFAXINE HCL ER 37.5 MG PO CP24: 37.5000 mg | ORAL_CAPSULE | Freq: Every day | ORAL | 1 refills | Status: DC

## 2019-04-09 NOTE — Telephone Encounter (Signed)
Spoke to the patient she was taking 2 for a while which was making her sleepy so went back to 1 capsule a day and informed her that she still has refill left ask pharmacist first.

## 2019-04-09 NOTE — Addendum Note (Signed)
Addended by: Olin Hauser on: 04/09/2019 06:01 PM   Modules accepted: Orders

## 2019-06-18 DIAGNOSIS — R69 Illness, unspecified: Secondary | ICD-10-CM | POA: Diagnosis not present

## 2019-06-27 DIAGNOSIS — Z0100 Encounter for examination of eyes and vision without abnormal findings: Secondary | ICD-10-CM | POA: Diagnosis not present

## 2019-07-05 DIAGNOSIS — D2262 Melanocytic nevi of left upper limb, including shoulder: Secondary | ICD-10-CM | POA: Diagnosis not present

## 2019-07-05 DIAGNOSIS — B001 Herpesviral vesicular dermatitis: Secondary | ICD-10-CM | POA: Diagnosis not present

## 2019-07-05 DIAGNOSIS — L820 Inflamed seborrheic keratosis: Secondary | ICD-10-CM | POA: Diagnosis not present

## 2019-07-05 DIAGNOSIS — L821 Other seborrheic keratosis: Secondary | ICD-10-CM | POA: Diagnosis not present

## 2019-07-05 DIAGNOSIS — D2271 Melanocytic nevi of right lower limb, including hip: Secondary | ICD-10-CM | POA: Diagnosis not present

## 2019-07-05 DIAGNOSIS — L570 Actinic keratosis: Secondary | ICD-10-CM | POA: Diagnosis not present

## 2019-07-05 DIAGNOSIS — X32XXXA Exposure to sunlight, initial encounter: Secondary | ICD-10-CM | POA: Diagnosis not present

## 2019-07-05 DIAGNOSIS — L298 Other pruritus: Secondary | ICD-10-CM | POA: Diagnosis not present

## 2019-07-05 DIAGNOSIS — Z85828 Personal history of other malignant neoplasm of skin: Secondary | ICD-10-CM | POA: Diagnosis not present

## 2019-07-05 DIAGNOSIS — L538 Other specified erythematous conditions: Secondary | ICD-10-CM | POA: Diagnosis not present

## 2019-07-10 ENCOUNTER — Telehealth: Payer: Self-pay

## 2019-07-10 NOTE — Telephone Encounter (Signed)
Pt is due for a repeat colonoscopy. Pt stated she has an esophageal polyp she needs Dr. Allen Norris  to look at.

## 2019-07-10 NOTE — Telephone Encounter (Signed)
Patient wants to know if it's time to have a EDG & Colonoscopy?

## 2019-07-11 NOTE — Telephone Encounter (Signed)
Should I schedule a in person visit first?

## 2019-07-16 ENCOUNTER — Other Ambulatory Visit: Payer: Self-pay

## 2019-07-16 DIAGNOSIS — Z86010 Personal history of colonic polyps: Secondary | ICD-10-CM

## 2019-07-18 IMAGING — MG DIGITAL SCREENING BILATERAL MAMMOGRAM WITH TOMO AND CAD
8 series · 8 of 24 positions shown · non-contrast
Comparison: Previous exam(s).

CLINICAL DATA: Screening.

EXAM:
DIGITAL SCREENING BILATERAL MAMMOGRAM WITH TOMO AND CAD

[R MLO synth-2D]
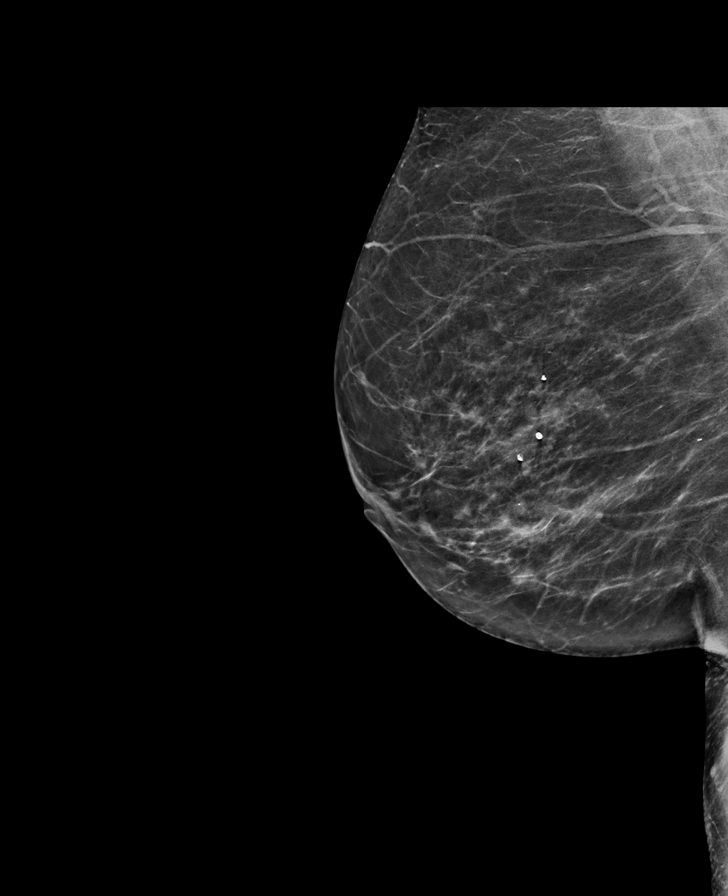

[L MLO synth-2D]
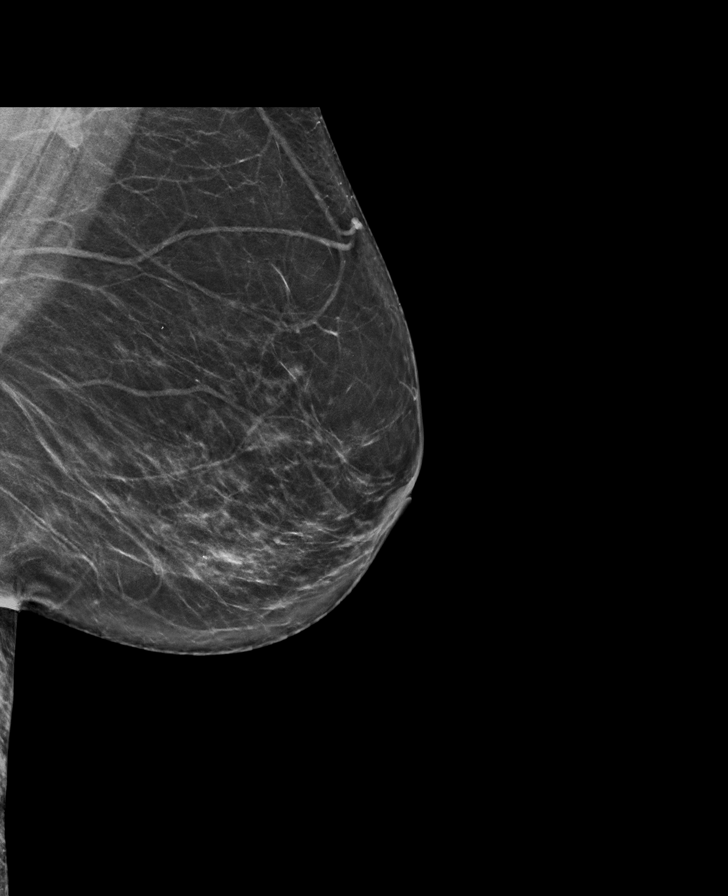

[L CC synth-2D]
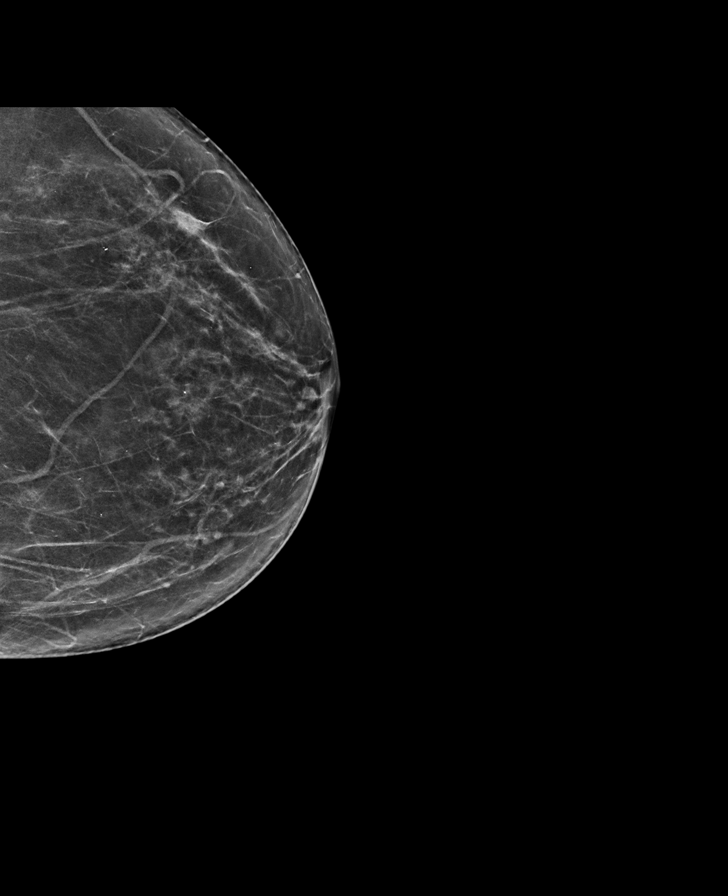

[R CC synth-2D]
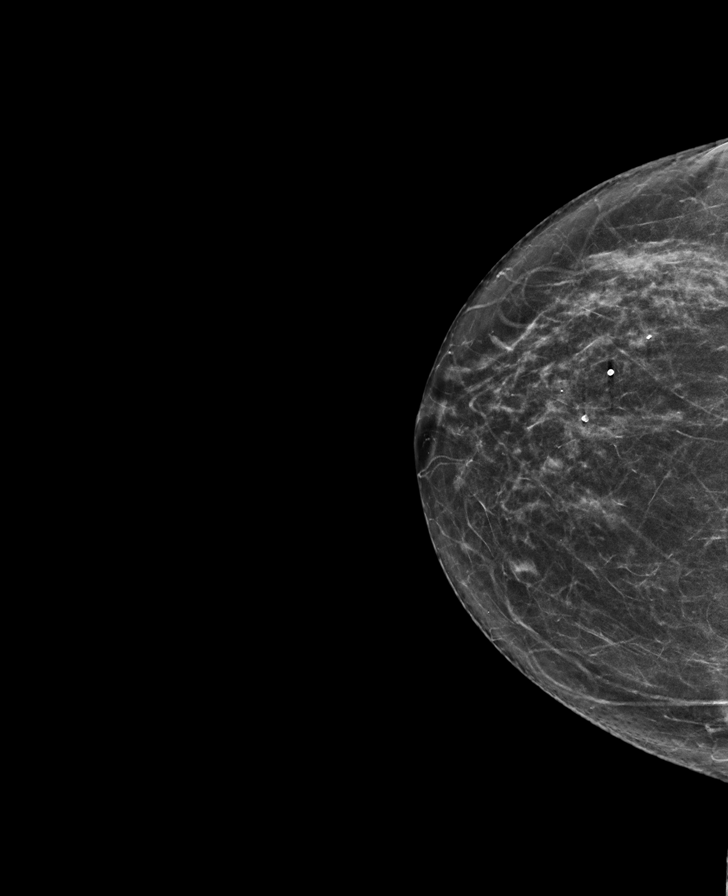

[R CC tomo · tomo slice 34/67.0]
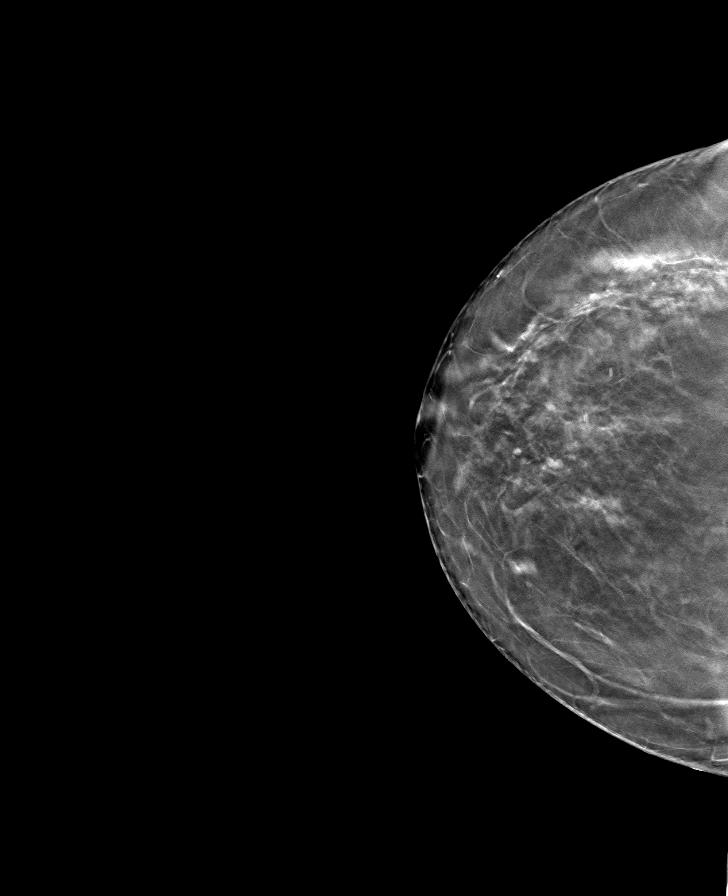

[L MLO tomo · tomo slice 37/72.0]
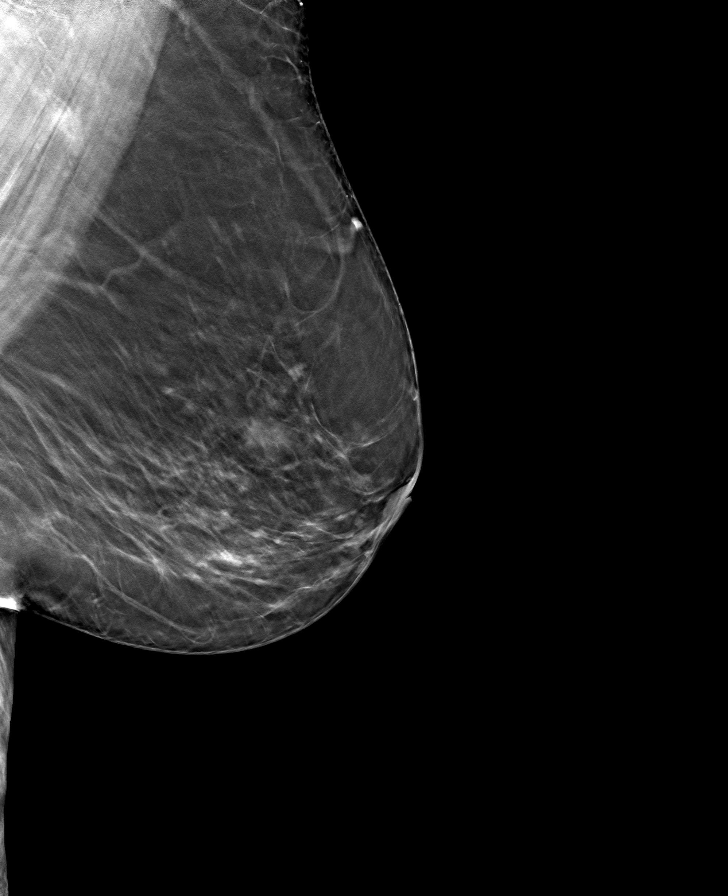

[L CC tomo · tomo slice 34/67.0]
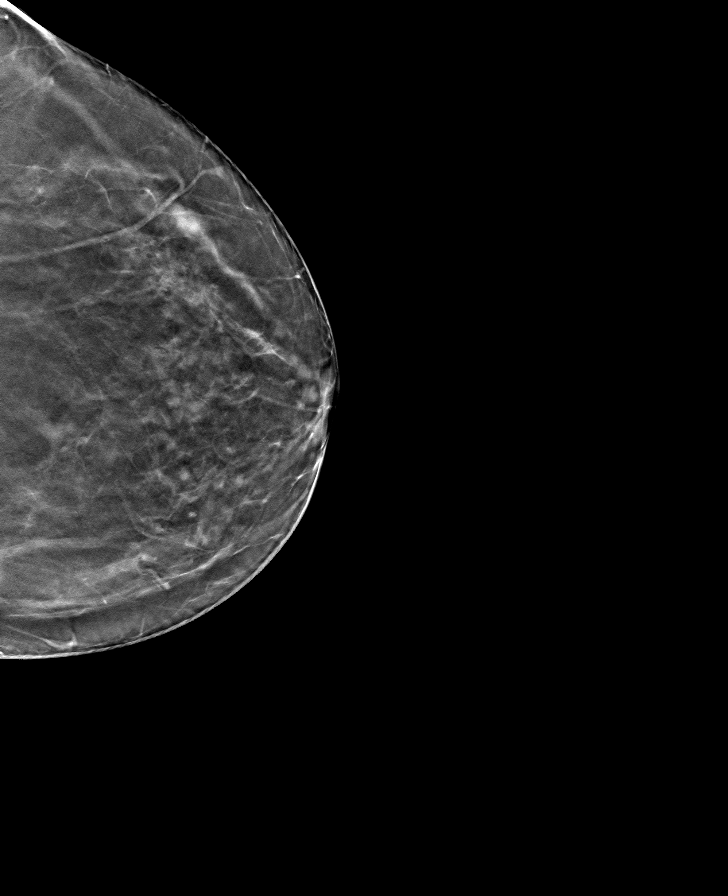

[R MLO tomo · tomo slice 35/70.0]
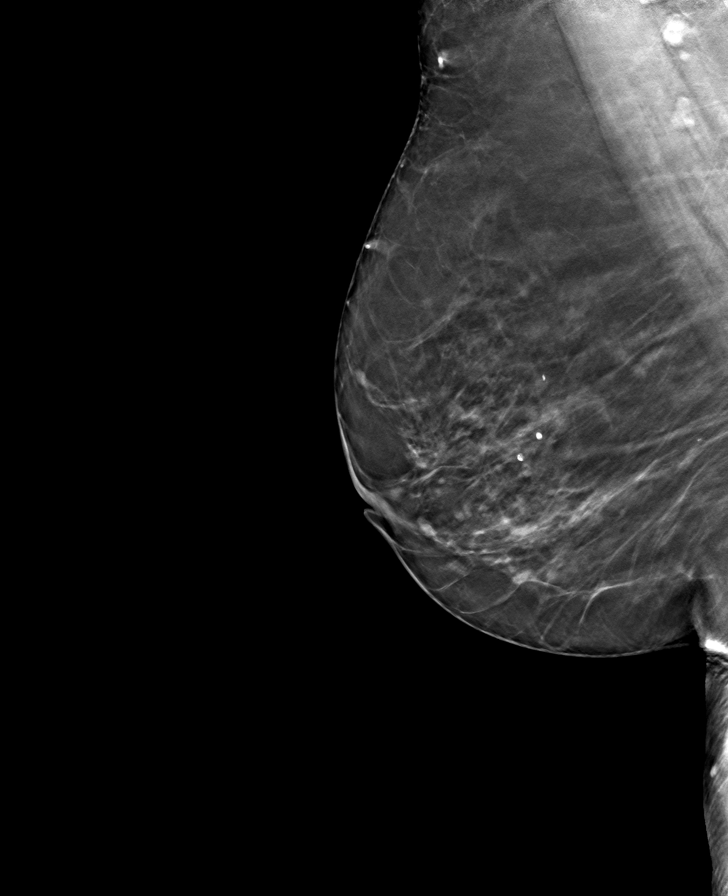

[8 of 24 positions shown; findings below may reference images not displayed]

ACR Breast Density Category b: There are scattered areas of
fibroglandular density.
FINDINGS: In the left breast, a possible asymmetry warrants further
evaluation. In the right breast, no findings suspicious for
malignancy. Images were processed with CAD.
IMPRESSION: Further evaluation is suggested for possible asymmetry in the left
breast.

RECOMMENDATION:
Diagnostic mammogram and possibly ultrasound of the left breast.
(Code:DI-[id number])

The patient will be contacted regarding the findings, and additional
imaging will be scheduled.

BI-RADS CATEGORY  0: Incomplete. Need additional imaging evaluation
and/or prior mammograms for comparison.

## 2019-07-23 NOTE — Telephone Encounter (Signed)
Wants to reschedule 09/07/19 procedure. Decided to make an appt with ENT first.

## 2019-07-25 DIAGNOSIS — R040 Epistaxis: Secondary | ICD-10-CM | POA: Diagnosis not present

## 2019-07-25 DIAGNOSIS — J301 Allergic rhinitis due to pollen: Secondary | ICD-10-CM | POA: Diagnosis not present

## 2019-07-27 ENCOUNTER — Ambulatory Visit (INDEPENDENT_AMBULATORY_CARE_PROVIDER_SITE_OTHER): Payer: Medicare HMO | Admitting: Family Medicine

## 2019-07-27 ENCOUNTER — Other Ambulatory Visit: Payer: Self-pay

## 2019-07-27 ENCOUNTER — Ambulatory Visit: Payer: Self-pay | Admitting: *Deleted

## 2019-07-27 ENCOUNTER — Encounter: Payer: Self-pay | Admitting: Family Medicine

## 2019-07-27 VITALS — BP 115/55 | HR 79 | Temp 97.1°F | Resp 16 | Ht 64.0 in | Wt 212.0 lb

## 2019-07-27 DIAGNOSIS — S7012XA Contusion of left thigh, initial encounter: Secondary | ICD-10-CM

## 2019-07-27 DIAGNOSIS — W19XXXA Unspecified fall, initial encounter: Secondary | ICD-10-CM

## 2019-07-27 DIAGNOSIS — S2341XA Sprain of ribs, initial encounter: Secondary | ICD-10-CM

## 2019-07-27 MED ORDER — TRAMADOL HCL 50 MG PO TABS: 50.0000 mg | ORAL_TABLET | Freq: Four times a day (QID) | ORAL | 0 refills | Status: AC | PRN

## 2019-07-27 NOTE — Progress Notes (Signed)
Subjective:    Patient ID: Kelly Weber, female    DOB: September 25, 1945, 74 y.o.   MRN: [id number]  Kelly Weber is a 74 y.o. female presenting on 07/27/2019 for Fall (left side onset 4 days)   HPI   Hematoma, L upper thigh Fall injury 4 days ago, accidental fall at home slipped on vacuum cleaner hose/cord and fell, injured her left side, thigh foot, toes were bruised now doing better. Some pain, has improvement in left thigh but still bruised and has hard knot felt there asking about this. Did not hit head or injure head, no loss of consciousness    Depression screen [facility] 2/9 01/29/2019 08/04/2018 05/29/2018  Decreased Interest 1 0 1  Down, Depressed, Hopeless 0 0 1  PHQ - 2 Score 1 0 2  Altered sleeping 1 0 0  Tired, decreased energy 1 1 3   Change in appetite 0 0 1  Feeling bad or failure about yourself  0 0 1  Trouble concentrating 0 0 0  Moving slowly or fidgety/restless 0 0 0  Suicidal thoughts 0 0 0  PHQ-9 Score 3 1 7   Difficult doing work/chores Not difficult at all Not difficult at all Not difficult at all  Some recent data might be hidden    Social History   Tobacco Use  . Smoking status: Former Smoker    Packs/day: 1.00    Years: 30.00    Pack years: 30.00    Types: Cigarettes    Quit date: 01/19/2014    Years since quitting: 5.5  . Smokeless tobacco: Former Network engineer Use Topics  . Alcohol use: No    Alcohol/week: 0.0 standard drinks  . Drug use: No    Review of Systems Per HPI unless specifically indicated above     Objective:    BP (!) 115/55   Pulse 79   Temp (!) 97.1 F (36.2 C) (Temporal)   Resp 16   Ht 5\' 4"  (1.626 m)   Wt 212 lb (96.2 kg)   BMI 36.39 kg/m   Wt Readings from Last 3 Encounters:  07/27/19 212 lb (96.2 kg)  01/29/19 211 lb 12.8 oz (96.1 kg)  08/31/18 207 lb (93.9 kg)    Physical Exam Vitals and nursing note reviewed.  Constitutional:      General: She is not in acute distress.    Appearance: She is well-developed.  She is not diaphoretic.     Comments: Well-appearing, comfortable, cooperative  HENT:     Head: Normocephalic and atraumatic.  Eyes:     General:        Right eye: No discharge.        Left eye: No discharge.     Conjunctiva/sclera: Conjunctivae normal.  Cardiovascular:     Rate and Rhythm: Normal rate.  Pulmonary:     Effort: Pulmonary effort is normal. No respiratory distress.     Breath sounds: Normal breath sounds. No wheezing or rales.  Chest:     Chest wall: Tenderness (ribcage L side) present.  Musculoskeletal:     Comments: Left upper thigh large area of ecchymosis appears consistent and some palpable area of hematoma with some firm area of induration, mild tender.  Skin:    General: Skin is warm and dry.     Findings: No erythema or rash.  Neurological:     Mental Status: She is alert and oriented to person, place, and time.  Psychiatric:  Behavior: Behavior normal.     Comments: Well groomed, good eye contact, normal speech and thoughts    Results for orders placed or performed in visit on 01/29/19  POCT glycosylated hemoglobin (Hb A1C)  Result Value Ref Range   Hemoglobin A1C 5.7 (A) 4.0 - 5.6 %      Assessment & Plan:   Problem List Items Addressed This Visit    None    Visit Diagnoses    Traumatic hematoma of thigh, left, initial encounter    -  Primary   Relevant Medications   traMADol (ULTRAM) 50 MG tablet   Sprain of costal cartilage, initial encounter       Relevant Medications   traMADol (ULTRAM) 50 MG tablet   Fall, initial encounter          #Hematoma L thigh Exam supportive. No sign of other injury. No obvious bony tenderness. No other LE edema. Exam is superficial and consistent with hematoma. Reassurance Limited pain relief from current meds Offer short course Tramadol 50mg  PRN #20 pills for several days, can help rest Use heat / ice alternating Monitor if not resolved No x-rays needed at this time, can consider if not improving -  considering L rib cage area but this is improving  Meds ordered this encounter  Medications  . traMADol (ULTRAM) 50 MG tablet    Sig: Take 1 tablet (50 mg total) by mouth every 6 (six) hours as needed for up to 5 days.    Dispense:  20 tablet    Refill:  0      Follow up plan: Return if symptoms worsen or fail to improve.   Nobie Putnam, [facility] 07/27/2019, 2:25 PM

## 2019-07-27 NOTE — Patient Instructions (Addendum)
Thank you for coming to the office today.  Tramadol as needed for pain. Can use only as needed and at night if prefer.  The spot on Left thigh is a hematoma - it is some collection of blood under skin that will take time to re absorb. Use heat and ice alternating to help it. May take ibuprofen as needed.  Please schedule a Follow-up Appointment to: Return if symptoms worsen or fail to improve.  If you have any other questions or concerns, please feel free to call the office or send a message through [place]. You may also schedule an earlier appointment if necessary.  Additionally, you may be receiving a survey about your experience at our office within a few days to 1 week by e-mail or mail. We value your feedback.  Nobie Putnam, DO [place]

## 2019-07-27 NOTE — Telephone Encounter (Signed)
She called in c/o having a large bruise at the top of her left thigh at the panty line with a knot in it from falling on Monday while using the vacuum cleaner.  She also jarred her left little toe on the leg of the table trying to prevent the fall.   She also has a bruise on the top left upper arm that is not swollen or sore.   She is also c/o pain over her left rib area that hurts when she coughs.     She is not on blood thinners but is concerned about having a blood clot in the bruise in the left upper thigh.   "It's better than it was".   "It's not as black and hard".    No history of blood clots.    She is taking ibuprofen for the pain.  I made her an appt for today at 2:20 with Dr. Parks Ranger.   COVID-19 screening questionnaire for COVID-19 completed and cleared for office visit.    I forwarded my notes to [facility] for Dr. Parks Ranger.  I gave pt instructions to go to the ED if she became short of breath or having chest pain.   She verbalized understanding and was agreeable.     Reason for Disposition . [1] MODERATE leg swelling (e.g., swelling extends up to knees) AND [2] new onset or worsening    [place] and has bruise with a knot on upper leg at panty line.  Answer Assessment - Initial Assessment Questions 1. ONSET: "When did the swelling start?" (e.g., minutes, hours, days)     I lost my balance while vaccuming.   I jarred my toe on the leg of the table to prevent me from falling. 2. LOCATION: "What part of the leg is swollen?"  "Are both legs swollen or just one leg?"     I have a bruise on the back part of my left arm.   My left leg has a bruise at the upper part of my leg at the panty line.  It feels hard but not as bad as Monday.  It's not as black as it was.   Monday I fell.  My left foot my little toe is bruised where I jammed against the table let to prevent me from falling.   I hit my ribs on the left side above my stomach.  It hurts to cough.  No bruising.      3. SEVERITY: "How bad is the swelling?" (e.g., localized; mild, moderate, severe)  - Localized - small area of swelling localized to one leg  - MILD pedal edema - swelling limited to foot and ankle, pitting edema < 1/4 inch (6 mm) deep, rest and elevation eliminate most or all swelling  - MODERATE edema - swelling of lower leg to knee, pitting edema > 1/4 inch (6 mm) deep, rest and elevation only partially reduce swelling  - SEVERE edema - swelling extends above knee, facial or hand swelling present      No swelling of left leg except in where the bruise is. 4. REDNESS: "Does the swelling look red or infected?"     No  No cuts or scraps. 5. PAIN: "Is the swelling painful to touch?" If so, ask: "How painful is it?"   (Scale 1-10; mild, moderate or severe)     Yes when I press on the knot in my left upper leg.   It hurts to take a deep breath or  cough in my left ribs.   No pain in left upper arm bruise. 6. FEVER: "Do you have a fever?" If so, ask: "What is it, how was it measured, and when did it start?"      Not asked 7. CAUSE: "What do you think is causing the leg swelling?"     I fell on Monday while vaccuming. 8. MEDICAL HISTORY: "Do you have a history of heart failure, kidney disease, liver failure, or cancer?"     None of the above.    Not on blood thinners. 9. RECURRENT SYMPTOM: "Have you had leg swelling before?" If so, ask: "When was the last time?" "What happened that time?"     No 10. OTHER SYMPTOMS: "Do you have any other symptoms?" (e.g., chest pain, difficulty breathing)       None of the above 11. PREGNANCY: "Is there any chance you are pregnant?" "When was your last menstrual period?"       N/A due to age  Protocols used: LEG SWELLING AND EDEMA-A-AH

## 2019-07-30 ENCOUNTER — Ambulatory Visit: Payer: Medicare HMO | Admitting: Family Medicine

## 2019-07-31 ENCOUNTER — Other Ambulatory Visit: Payer: Self-pay

## 2019-07-31 ENCOUNTER — Other Ambulatory Visit: Payer: Self-pay | Admitting: Family Medicine

## 2019-07-31 DIAGNOSIS — J300 Vasomotor rhinitis: Secondary | ICD-10-CM | POA: Diagnosis not present

## 2019-07-31 DIAGNOSIS — T781XXD Other adverse food reactions, not elsewhere classified, subsequent encounter: Secondary | ICD-10-CM | POA: Diagnosis not present

## 2019-07-31 DIAGNOSIS — H1045 Other chronic allergic conjunctivitis: Secondary | ICD-10-CM | POA: Diagnosis not present

## 2019-07-31 DIAGNOSIS — J309 Allergic rhinitis, unspecified: Secondary | ICD-10-CM | POA: Diagnosis not present

## 2019-07-31 DIAGNOSIS — Z1231 Encounter for screening mammogram for malignant neoplasm of breast: Secondary | ICD-10-CM

## 2019-08-13 DIAGNOSIS — R69 Illness, unspecified: Secondary | ICD-10-CM | POA: Diagnosis not present

## 2019-08-28 ENCOUNTER — Other Ambulatory Visit: Payer: Self-pay | Admitting: Family Medicine

## 2019-08-28 ENCOUNTER — Encounter: Payer: Self-pay | Admitting: Family Medicine

## 2019-08-28 ENCOUNTER — Other Ambulatory Visit: Payer: Self-pay

## 2019-08-28 ENCOUNTER — Ambulatory Visit (INDEPENDENT_AMBULATORY_CARE_PROVIDER_SITE_OTHER): Payer: Medicare HMO | Admitting: Family Medicine

## 2019-08-28 VITALS — BP 110/76 | HR 85 | Temp 97.5°F | Resp 16 | Ht 64.0 in | Wt 213.6 lb

## 2019-08-28 DIAGNOSIS — E669 Obesity, unspecified: Secondary | ICD-10-CM

## 2019-08-28 DIAGNOSIS — R7309 Other abnormal glucose: Secondary | ICD-10-CM | POA: Diagnosis not present

## 2019-08-28 DIAGNOSIS — R69 Illness, unspecified: Secondary | ICD-10-CM | POA: Diagnosis not present

## 2019-08-28 DIAGNOSIS — F3342 Major depressive disorder, recurrent, in full remission: Secondary | ICD-10-CM | POA: Diagnosis not present

## 2019-08-28 DIAGNOSIS — G4701 Insomnia due to medical condition: Secondary | ICD-10-CM

## 2019-08-28 DIAGNOSIS — Z0000 Encounter for general adult medical examination without abnormal findings: Secondary | ICD-10-CM

## 2019-08-28 DIAGNOSIS — E7800 Pure hypercholesterolemia, unspecified: Secondary | ICD-10-CM

## 2019-08-28 DIAGNOSIS — J3089 Other allergic rhinitis: Secondary | ICD-10-CM

## 2019-08-28 DIAGNOSIS — J432 Centrilobular emphysema: Secondary | ICD-10-CM

## 2019-08-28 DIAGNOSIS — E559 Vitamin D deficiency, unspecified: Secondary | ICD-10-CM

## 2019-08-28 LAB — POCT GLYCOSYLATED HEMOGLOBIN (HGB A1C): Hemoglobin A1C: 5.3 % (ref 4.0–5.6)

## 2019-08-28 NOTE — Progress Notes (Signed)
Subjective:    Patient ID: Kelly Weber, female    DOB: [birth year], 74 y.o.   MRN: [id number]  Kelly Weber is a 74 y.o. female presenting on 08/28/2019 for PreDM and Anxiety   HPI   FOLLOW-UP INSOMNIA /Depression, Major, Chronic recurrent Last visit 01/2019. Doing well Venlafaxine 37.5mg  daily. Has remained off other medications such as Ambien, Trazodone, Lexapro, Zoloft - She is not endorsing any other significant related symptoms  Pre-Diabetes Prior trend A1c 5.6 to 5.8 Today due for A1c. Continues to do well with lifestyle but no significant weight loss Previously [facility]  Allergies, chronic seasonal Allergist started Azelastine nasal, she stopped Flonase but asking if can take both. Has history of nosebleeds at times.   Health Maintenance:  [place] SCreening - Dr Allen Norris  [facility] [place] - last done 11/2014, next due in 2021 already scheduled.  Depression screen [facility] 2/9 08/28/2019 01/29/2019 08/04/2018  Decreased Interest 1 1 0  Down, Depressed, Hopeless 0 0 0  PHQ - 2 Score 1 1 0  Altered sleeping 0 1 0  Tired, decreased energy 1 1 1   Change in appetite 0 0 0  Feeling bad or failure about yourself  0 0 0  Trouble concentrating 0 0 0  Moving slowly or fidgety/restless 0 0 0  Suicidal thoughts 0 0 0  PHQ-9 Score 2 3 1   Difficult doing work/chores Not difficult at all Not difficult at all Not difficult at all  Some recent data might be hidden   GAD 7 : Generalized Anxiety Score 08/28/2019 01/29/2019 08/04/2018 10/26/2017  Nervous, Anxious, on Edge 1 1 0 1  Control/stop worrying 0 0 0 1  Worry too much - different things 0 0 0 1  Trouble relaxing 0 0 0 2  Restless 0 0 0 2  Easily annoyed or irritable 0 1 1 2   Afraid - awful might happen 1 0 0 1  Total GAD 7 Score 2 2 1 10   Anxiety Difficulty Not difficult at all Not difficult at all Not difficult at all Not difficult at all     Social History   Tobacco Use  . Smoking status: Former Smoker   Packs/day: 1.00    Years: 30.00    Pack years: 30.00    Types: Cigarettes    Quit date: 01/19/2014    Years since quitting: 5.6  . Smokeless tobacco: Former Network engineer Use Topics  . Alcohol use: No    Alcohol/week: 0.0 standard drinks  . Drug use: No    Review of Systems Per HPI unless specifically indicated above     Objective:    BP 110/76 (BP Location: Left Arm, Patient Position: Sitting, Cuff Size: Normal) Comment: manual  Pulse 85   Temp (!) 97.5 F (36.4 C) (Temporal)   Resp 16   Ht 5\' 4"  (1.626 m)   Wt 213 lb 9.6 oz (96.9 kg)   SpO2 98%   BMI 36.66 kg/m   Wt Readings from Last 3 Encounters:  08/28/19 213 lb 9.6 oz (96.9 kg)  07/27/19 212 lb (96.2 kg)  01/29/19 211 lb 12.8 oz (96.1 kg)    Physical Exam Vitals and nursing note reviewed.  Constitutional:      General: She is not in acute distress.    Appearance: She is well-developed. She is not diaphoretic.     Comments: Well-appearing, comfortable, cooperative  HENT:     Head: Normocephalic and atraumatic.  Eyes:  General:        Right eye: No discharge.        Left eye: No discharge.     Conjunctiva/sclera: Conjunctivae normal.  Neck:     Thyroid: No thyromegaly.  Cardiovascular:     Rate and Rhythm: Normal rate and regular rhythm.     Heart sounds: Normal heart sounds. No murmur.  Pulmonary:     Effort: Pulmonary effort is normal. No respiratory distress.     Breath sounds: Normal breath sounds. No wheezing or rales.  Musculoskeletal:        General: Normal range of motion.     Cervical back: Normal range of motion and neck supple.  Lymphadenopathy:     Cervical: No cervical adenopathy.  Skin:    General: Skin is warm and dry.     Findings: No erythema or rash.  Neurological:     Mental Status: She is alert and oriented to person, place, and time.  Psychiatric:        Behavior: Behavior normal.     Comments: Well groomed, good eye contact, normal speech and thoughts      Recent  Labs    01/29/19 1400 08/28/19 1500  HGBA1C 5.7* 5.3    Results for orders placed or performed in visit on 08/28/19  POCT HgB A1C  Result Value Ref Range   Hemoglobin A1C 5.3 4.0 - 5.6 %      Assessment & Plan:   Problem List Items Addressed This Visit    Major depression, recurrent, full remission ([place])    In remission, controlled on SNRI Prior family stressors [place] insomnia, chronic - improved Prior Meds: Sertraline 25mg , Escitalopram 20mg , Ambien, Trazodone 50mg  - No prior Psych / counseling, has church support and friends  Plan: 1. Continue Venlafaxine 37.5mg  daily current dose now - consider future dose increase may inc on her own if ready to 75mg  in future, notify office - Offered counseling Follow-up      Elevated hemoglobin A1c - Primary    A1c down to 5.3, excellent improvement Improved lifestyle Encourage continue lifestyle improvement overall No medication Follow-up q 6 mo as planned      Relevant Orders   POCT HgB A1C (Completed)    Other Visit Diagnoses    Seasonal allergic rhinitis due to other allergic trigger         May add Flonase to Azelastine Future can consider Dymista if needed  No orders of the defined types were placed in this encounter.   Follow up plan: Return in about 5 months (around 01/28/2020) for Annual Physical.  Future labs ordered for 01/23/20   Nobie Putnam, [facility] 08/28/2019, 2:58 PM

## 2019-08-28 NOTE — Assessment & Plan Note (Signed)
In remission, controlled on SNRI Prior family stressors [place] insomnia, chronic - improved Prior Meds: Sertraline 25mg, Escitalopram 20mg, Ambien, Trazodone 50mg - No prior Psych / counseling, has church support and friends  Plan: 1. Continue Venlafaxine 37.5mg daily current dose now - consider future dose increase may inc on her own if ready to 75mg in future, notify office - Offered counseling Follow-up 

## 2019-08-28 NOTE — Assessment & Plan Note (Signed)
A1c down to 5.3, excellent improvement Improved lifestyle Encourage continue lifestyle improvement overall No medication Follow-up q 6 mo as planned

## 2019-08-28 NOTE — Patient Instructions (Addendum)
Thank you for coming to the office today.  Recent Labs    01/29/19 1400 08/28/19 1500  HGBA1C 5.7* 5.3   ------------------  [place] COVID19 Vaccine Information  LOCATION:  [facility]) [place] [address]  Hours: Monday - Sunday 8:00am to 12:00pm  COVID-19 Vaccines By Appointment Only  Sign up for [place] List  [email or web address] or text "VACCINE" to [phone] or call [phone]  -----------------------------------------------------  [facility] can do vaccines as well.  DUE for FASTING BLOOD WORK (no food or drink after midnight before the lab appointment, only water or coffee without cream/sugar on the morning of)  SCHEDULE "Lab Only" visit in the morning at the clinic for lab draw in 5-6 MONTHS   - Make sure Lab Only appointment is at about 1 week before your next appointment, so that results will be available  For Lab Results, once available within 2-3 days of blood draw, you can can log in to MyChart online to view your results and a brief explanation. Also, we can discuss results at next follow-up visit.    Please schedule a Follow-up Appointment to: Return in about 5 months (around 01/28/2020) for Annual Physical.  If you have any other questions or concerns, please feel free to call the office or send a message through [place]. You may also schedule an earlier appointment if necessary.  Additionally, you may be receiving a survey about your experience at our office within a few days to 1 week by e-mail or mail. We value your feedback.  Nobie Putnam, DO [place]

## 2019-09-07 ENCOUNTER — Ambulatory Visit: Admit: 2019-09-07 | Payer: Medicare HMO | Admitting: Gastroenterology

## 2019-09-07 SURGERY — COLONOSCOPY WITH PROPOFOL
Anesthesia: Choice

## 2019-09-27 DIAGNOSIS — H2513 Age-related nuclear cataract, bilateral: Secondary | ICD-10-CM | POA: Diagnosis not present

## 2019-09-27 DIAGNOSIS — H18413 Arcus senilis, bilateral: Secondary | ICD-10-CM | POA: Diagnosis not present

## 2019-09-27 DIAGNOSIS — H40033 Anatomical narrow angle, bilateral: Secondary | ICD-10-CM | POA: Diagnosis not present

## 2019-10-05 ENCOUNTER — Ambulatory Visit
Admission: RE | Admit: 2019-10-05 | Discharge: 2019-10-05 | Disposition: A | Discharge status: Home / Self Care | Payer: Medicare HMO | Source: Ambulatory Visit | Attending: Family Medicine | Admitting: Family Medicine

## 2019-10-05 ENCOUNTER — Other Ambulatory Visit: Payer: Self-pay

## 2019-10-05 DIAGNOSIS — Z1231 Encounter for screening mammogram for malignant neoplasm of breast: Secondary | ICD-10-CM

## 2019-10-23 ENCOUNTER — Ambulatory Visit
Admission: RE | Admit: 2019-10-23 | Discharge: 2019-10-23 | Disposition: A | Discharge status: Home / Self Care | Payer: Medicare HMO | Source: Ambulatory Visit | Attending: Family Medicine | Admitting: Family Medicine

## 2019-10-23 ENCOUNTER — Ambulatory Visit
Admission: RE | Admit: 2019-10-23 | Discharge: 2019-10-23 | Disposition: A | Discharge status: Home / Self Care | Payer: Medicare HMO | Attending: Family Medicine | Admitting: Family Medicine

## 2019-10-23 ENCOUNTER — Other Ambulatory Visit: Payer: Self-pay

## 2019-10-23 ENCOUNTER — Encounter: Payer: Self-pay | Admitting: Family Medicine

## 2019-10-23 ENCOUNTER — Ambulatory Visit: Admission: RE | Admit: 2019-10-23 | Payer: Medicare HMO | Source: Ambulatory Visit

## 2019-10-23 ENCOUNTER — Ambulatory Visit (INDEPENDENT_AMBULATORY_CARE_PROVIDER_SITE_OTHER): Payer: Medicare HMO | Admitting: Family Medicine

## 2019-10-23 DIAGNOSIS — D1809 Hemangioma of other sites: Secondary | ICD-10-CM | POA: Diagnosis not present

## 2019-10-23 DIAGNOSIS — G8929 Other chronic pain: Secondary | ICD-10-CM | POA: Insufficient documentation

## 2019-10-23 DIAGNOSIS — M62830 Muscle spasm of back: Secondary | ICD-10-CM

## 2019-10-23 DIAGNOSIS — M545 Low back pain, unspecified: Secondary | ICD-10-CM

## 2019-10-23 DIAGNOSIS — M810 Age-related osteoporosis without current pathological fracture: Secondary | ICD-10-CM | POA: Diagnosis not present

## 2019-10-23 DIAGNOSIS — M549 Dorsalgia, unspecified: Secondary | ICD-10-CM

## 2019-10-23 DIAGNOSIS — M4697 Unspecified inflammatory spondylopathy, lumbosacral region: Secondary | ICD-10-CM | POA: Diagnosis not present

## 2019-10-23 DIAGNOSIS — M47814 Spondylosis without myelopathy or radiculopathy, thoracic region: Secondary | ICD-10-CM | POA: Diagnosis not present

## 2019-10-23 DIAGNOSIS — I700 Atherosclerosis of aorta: Secondary | ICD-10-CM | POA: Diagnosis not present

## 2019-10-23 DIAGNOSIS — M47816 Spondylosis without myelopathy or radiculopathy, lumbar region: Secondary | ICD-10-CM | POA: Diagnosis not present

## 2019-10-23 DIAGNOSIS — S299XXA Unspecified injury of thorax, initial encounter: Secondary | ICD-10-CM | POA: Diagnosis not present

## 2019-10-23 DIAGNOSIS — S3992XA Unspecified injury of lower back, initial encounter: Secondary | ICD-10-CM | POA: Diagnosis not present

## 2019-10-23 MED ORDER — BACLOFEN 10 MG PO TABS: 5.0000 mg | ORAL_TABLET | Freq: Three times a day (TID) | ORAL | 2 refills | Status: DC | PRN

## 2019-10-23 NOTE — Patient Instructions (Addendum)
Thank you for coming to the office today.  1. For your Back Pain - I think that this is due to Muscle Spasms or strain.  2. Start Baclofen (Lioresal) 10mg  tablets - cut in half for 5mg  at night for muscle relaxant - may make you sedated or sleepy (be careful driving or working on this) if tolerated you can take every 8 hours, half or whole tab 3. May use Tylenol Extra Str 500mg  tabs - may take 1-2 tablets every 6 hours as needed 5. Recommend to start using heating pad on your lower back 1-2x daily for few weeks  This pain may take weeks to months to fully resolve, but hopefully it will respond to the medicine initially. All back injuries (small or serious) are slow to heal since we use our back muscles every day. Be careful with turning, twisting, lifting, sitting / standing for prolonged periods, and avoid re-injury.  If your symptoms significantly worsen with more pain, or new symptoms with weakness in one or both legs, new or different shooting leg pains, numbness in legs or groin, loss of control or retention of urine or bowel movements, please call back for advice and you may need to go directly to the Emergency Department.  Try topical Voltaren gel as needed anti inflammatory.  Please schedule a Follow-up Appointment to: Return if symptoms worsen or fail to improve, for back pain.  If you have any other questions or concerns, please feel free to call the office or send a message through [place]. You may also schedule an earlier appointment if necessary.  Additionally, you may be receiving a survey about your experience at our office within a few days to 1 week by e-mail or mail. We value your feedback.  Nobie Putnam, DO [place]
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
## 2019-10-24 ENCOUNTER — Telehealth: Payer: Self-pay

## 2019-10-24 ENCOUNTER — Encounter: Payer: Self-pay | Admitting: Family Medicine

## 2019-10-24 DIAGNOSIS — I700 Atherosclerosis of aorta: Secondary | ICD-10-CM | POA: Insufficient documentation

## 2019-10-24 NOTE — Telephone Encounter (Signed)
Copied from [place] [phone]. Topic: General - Other >> Oct 24, 2019  3:30 PM Oneta Rack wrote: Reason for CRM: patient  inquiring about most recent imaging results stating her back pain is not improving. Patient seeking a following up call today

## 2019-10-24 NOTE — Telephone Encounter (Signed)
Called patient. X-ray results reviewed with her. See reports. No acute injury. OA/DJD multiple sites T/L spine. She should continue current therapy baclofen, tylenol and other recommendations at visit yesterday. Advised may take several weeks to months for full resolution.  Nobie Putnam, [facility] 10/24/2019, 6:46 PM

## 2019-11-06 ENCOUNTER — Other Ambulatory Visit: Payer: Self-pay | Admitting: Family Medicine

## 2019-11-06 DIAGNOSIS — F3342 Major depressive disorder, recurrent, in full remission: Secondary | ICD-10-CM

## 2019-12-24 DIAGNOSIS — R69 Illness, unspecified: Secondary | ICD-10-CM | POA: Diagnosis not present

## 2020-01-22 ENCOUNTER — Other Ambulatory Visit: Payer: Self-pay | Admitting: *Deleted

## 2020-01-22 DIAGNOSIS — E7800 Pure hypercholesterolemia, unspecified: Secondary | ICD-10-CM

## 2020-01-22 DIAGNOSIS — E669 Obesity, unspecified: Secondary | ICD-10-CM

## 2020-01-22 DIAGNOSIS — R7309 Other abnormal glucose: Secondary | ICD-10-CM

## 2020-01-22 DIAGNOSIS — J432 Centrilobular emphysema: Secondary | ICD-10-CM

## 2020-01-22 DIAGNOSIS — F3342 Major depressive disorder, recurrent, in full remission: Secondary | ICD-10-CM

## 2020-01-22 DIAGNOSIS — G4701 Insomnia due to medical condition: Secondary | ICD-10-CM

## 2020-01-22 DIAGNOSIS — Z0000 Encounter for general adult medical examination without abnormal findings: Secondary | ICD-10-CM

## 2020-01-22 DIAGNOSIS — E559 Vitamin D deficiency, unspecified: Secondary | ICD-10-CM

## 2020-01-23 ENCOUNTER — Other Ambulatory Visit: Payer: Self-pay

## 2020-01-23 ENCOUNTER — Other Ambulatory Visit: Payer: Medicare HMO

## 2020-01-23 DIAGNOSIS — E7800 Pure hypercholesterolemia, unspecified: Secondary | ICD-10-CM | POA: Diagnosis not present

## 2020-01-23 DIAGNOSIS — G4701 Insomnia due to medical condition: Secondary | ICD-10-CM | POA: Diagnosis not present

## 2020-01-23 DIAGNOSIS — J432 Centrilobular emphysema: Secondary | ICD-10-CM | POA: Diagnosis not present

## 2020-01-23 DIAGNOSIS — R7309 Other abnormal glucose: Secondary | ICD-10-CM | POA: Diagnosis not present

## 2020-01-23 DIAGNOSIS — E669 Obesity, unspecified: Secondary | ICD-10-CM | POA: Diagnosis not present

## 2020-01-23 DIAGNOSIS — R69 Illness, unspecified: Secondary | ICD-10-CM | POA: Diagnosis not present

## 2020-01-23 DIAGNOSIS — Z0000 Encounter for general adult medical examination without abnormal findings: Secondary | ICD-10-CM | POA: Diagnosis not present

## 2020-01-23 DIAGNOSIS — E559 Vitamin D deficiency, unspecified: Secondary | ICD-10-CM | POA: Diagnosis not present

## 2020-01-24 LAB — TSH: TSH: 2.57 mIU/L (ref 0.40–4.50)

## 2020-01-24 LAB — CBC WITH DIFFERENTIAL/PLATELET
Absolute Monocytes: 382 cells/uL (ref 200–950)
Basophils Absolute: 18 cells/uL (ref 0–200)
Basophils Relative: 0.4 %
Eosinophils Absolute: 129 cells/uL (ref 15–500)
Eosinophils Relative: 2.8 %
HCT: 37.1 % (ref 35.0–45.0)
Hemoglobin: 11.6 g/dL — ABNORMAL LOW (ref 11.7–15.5)
Lymphs Abs: 1978 cells/uL (ref 850–3900)
MCH: 27.1 pg (ref 27.0–33.0)
MCHC: 31.3 g/dL — ABNORMAL LOW (ref 32.0–36.0)
MCV: 86.7 fL (ref 80.0–100.0)
MPV: 10.4 fL (ref 7.5–12.5)
Monocytes Relative: 8.3 %
Neutro Abs: 2093 cells/uL (ref 1500–7800)
Neutrophils Relative %: 45.5 %
Platelets: 275 10*3/uL (ref 140–400)
RBC: 4.28 10*6/uL (ref 3.80–5.10)
RDW: 13.8 % (ref 11.0–15.0)
Total Lymphocyte: 43 %
WBC: 4.6 10*3/uL (ref 3.8–10.8)

## 2020-01-24 LAB — LIPID PANEL
Cholesterol: 185 mg/dL (ref <200)
HDL: 55 mg/dL (ref >=50)
LDL Cholesterol (Calc): 108 mg/dL (calc) — ABNORMAL HIGH
Non-HDL Cholesterol (Calc): 130 mg/dL (calc) — ABNORMAL HIGH (ref <130)
Total CHOL/HDL Ratio: 3.4 (calc) (ref <5.0)
Triglycerides: 117 mg/dL (ref <150)

## 2020-01-24 LAB — COMPLETE METABOLIC PANEL WITH GFR
AG Ratio: 1.5 (calc) (ref 1.0–2.5)
ALT: 8 U/L (ref 6–29)
AST: 16 U/L (ref 10–35)
Albumin: 3.7 g/dL (ref 3.6–5.1)
Alkaline phosphatase (APISO): 84 U/L (ref 37–153)
BUN: 14 mg/dL (ref 7–25)
CO2: 27 mmol/L (ref 20–32)
Calcium: 8.6 mg/dL (ref 8.6–10.4)
Chloride: 104 mmol/L (ref 98–110)
Creat: 0.8 mg/dL (ref 0.60–0.93)
GFR, Est African American: 84 mL/min/{1.73_m2} (ref >=60)
GFR, Est Non African American: 73 mL/min/{1.73_m2} (ref >=60)
Globulin: 2.5 g/dL (calc) (ref 1.9–3.7)
Glucose, Bld: 92 mg/dL (ref 65–99)
Potassium: 4.1 mmol/L (ref 3.5–5.3)
Sodium: 140 mmol/L (ref 135–146)
Total Bilirubin: 0.5 mg/dL (ref 0.2–1.2)
Total Protein: 6.2 g/dL (ref 6.1–8.1)

## 2020-01-24 LAB — HEMOGLOBIN A1C
Hgb A1c MFr Bld: 5.5 % of total Hgb (ref <5.7)
Mean Plasma Glucose: 111 (calc)
eAG (mmol/L): 6.2 (calc)

## 2020-01-24 LAB — VITAMIN D 25 HYDROXY (VIT D DEFICIENCY, FRACTURES): Vit D, 25-Hydroxy: 30 ng/mL (ref 30–100)

## 2020-01-30 ENCOUNTER — Ambulatory Visit (INDEPENDENT_AMBULATORY_CARE_PROVIDER_SITE_OTHER): Payer: Medicare HMO | Admitting: Family Medicine

## 2020-01-30 ENCOUNTER — Encounter: Payer: Self-pay | Admitting: Family Medicine

## 2020-01-30 ENCOUNTER — Other Ambulatory Visit: Payer: Self-pay

## 2020-01-30 VITALS — BP 110/70 | HR 83 | Temp 97.8°F | Resp 16 | Ht 64.0 in | Wt 216.0 lb

## 2020-01-30 DIAGNOSIS — I700 Atherosclerosis of aorta: Secondary | ICD-10-CM

## 2020-01-30 DIAGNOSIS — J432 Centrilobular emphysema: Secondary | ICD-10-CM

## 2020-01-30 DIAGNOSIS — Z23 Encounter for immunization: Secondary | ICD-10-CM

## 2020-01-30 DIAGNOSIS — Z0000 Encounter for general adult medical examination without abnormal findings: Secondary | ICD-10-CM

## 2020-01-30 DIAGNOSIS — E669 Obesity, unspecified: Secondary | ICD-10-CM

## 2020-01-30 DIAGNOSIS — M810 Age-related osteoporosis without current pathological fracture: Secondary | ICD-10-CM | POA: Diagnosis not present

## 2020-01-30 DIAGNOSIS — R69 Illness, unspecified: Secondary | ICD-10-CM | POA: Diagnosis not present

## 2020-01-30 DIAGNOSIS — J9801 Acute bronchospasm: Secondary | ICD-10-CM

## 2020-01-30 DIAGNOSIS — F3342 Major depressive disorder, recurrent, in full remission: Secondary | ICD-10-CM

## 2020-01-30 MED ORDER — ALBUTEROL SULFATE HFA 108 (90 BASE) MCG/ACT IN AERS: 2.0000 | INHALATION_SPRAY | Freq: Four times a day (QID) | RESPIRATORY_TRACT | 1 refills | Status: DC | PRN

## 2020-01-30 MED ORDER — VENLAFAXINE HCL ER 37.5 MG PO CP24: 37.5000 mg | ORAL_CAPSULE | Freq: Every day | ORAL | 3 refills | Status: DC
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
## 2020-01-30 NOTE — Patient Instructions (Addendum)
Thank you for coming to the office today.  If symptoms of breathing issue do not improve, notify us sooner and let me know, may need referral.  -------  Inhaler refilled  Mood medicine refilled, let me know if we need to adjust  Monitor spot on arm.   For DEXA Scan (Bone mineral density) screening for osteoporosis  NEXT week - Call the [place] below anytime to schedule your own appointment now that order has been placed.  [facility] [address] [place], [place] [address] Phone: [phone]  Let me know in future if worsening back pain issue - if we want to refer to Orthopedics / Physical Therapy  Please schedule a Follow-up Appointment to: Return in about 5 months (around 06/29/2020) for 4-5 month early March 2022 follow-up weight, breathing, back pain.  If you have any other questions or concerns, please feel free to call the office or send a message through [place]. You may also schedule an earlier appointment if necessary.  Additionally, you may be receiving a survey about your experience at our office within a few days to 1 week by e-mail or mail. We value your feedback.  Nobie Putnam, DO [place]

## 2020-01-31 NOTE — Assessment & Plan Note (Signed)
In remission, controlled on SNRI Prior family stressors Seconary insomnia, chronic - improved Prior Meds: Sertraline 25mg , Escitalopram 20mg , Ambien, Trazodone 50mg  - No prior Psych / counseling, has church support and friends  Plan: 1. Continue Venlafaxine 37.5mg  daily current dose now - consider future dose increase may inc on her own if ready to 75mg  in future, notify office - Offered counseling Follow-up

## 2020-01-31 NOTE — Assessment & Plan Note (Signed)
Stable chronic COPD mild emphysema without exacerbation today - Former smoker. No prior PFTs or maintenance therapy. - No recent exac >6 months - Reviewed course if any worsening respiratory status or more frequent exac may need formal PFTs further eval and consider maintenance therapy

## 2020-03-18 ENCOUNTER — Ambulatory Visit
Admission: RE | Admit: 2020-03-18 | Discharge: 2020-03-18 | Disposition: A | Discharge status: Home / Self Care | Payer: Medicare HMO | Source: Ambulatory Visit | Attending: Family Medicine | Admitting: Family Medicine

## 2020-03-18 ENCOUNTER — Other Ambulatory Visit: Payer: Self-pay

## 2020-03-18 DIAGNOSIS — Z8739 Personal history of other diseases of the musculoskeletal system and connective tissue: Secondary | ICD-10-CM | POA: Diagnosis not present

## 2020-03-18 DIAGNOSIS — Z8709 Personal history of other diseases of the respiratory system: Secondary | ICD-10-CM | POA: Diagnosis not present

## 2020-03-18 DIAGNOSIS — M810 Age-related osteoporosis without current pathological fracture: Secondary | ICD-10-CM | POA: Diagnosis not present

## 2020-03-18 DIAGNOSIS — Z85828 Personal history of other malignant neoplasm of skin: Secondary | ICD-10-CM | POA: Diagnosis not present

## 2020-03-18 DIAGNOSIS — R29890 Loss of height: Secondary | ICD-10-CM | POA: Diagnosis not present

## 2020-06-06 ENCOUNTER — Ambulatory Visit: Payer: Medicare HMO | Admitting: Family Medicine

## 2020-06-13 ENCOUNTER — Encounter: Payer: Self-pay | Admitting: Family Medicine

## 2020-06-13 ENCOUNTER — Ambulatory Visit (INDEPENDENT_AMBULATORY_CARE_PROVIDER_SITE_OTHER): Payer: Medicare HMO | Admitting: Family Medicine

## 2020-06-13 ENCOUNTER — Other Ambulatory Visit: Payer: Self-pay

## 2020-06-13 VITALS — BP 126/96 | HR 65 | Ht 64.0 in | Wt 219.8 lb

## 2020-06-13 DIAGNOSIS — M810 Age-related osteoporosis without current pathological fracture: Secondary | ICD-10-CM

## 2020-06-13 DIAGNOSIS — R7309 Other abnormal glucose: Secondary | ICD-10-CM

## 2020-06-13 DIAGNOSIS — M545 Low back pain, unspecified: Secondary | ICD-10-CM | POA: Diagnosis not present

## 2020-06-13 DIAGNOSIS — G8929 Other chronic pain: Secondary | ICD-10-CM | POA: Diagnosis not present

## 2020-06-13 MED ORDER — BACLOFEN 10 MG PO TABS: 5.0000 mg | ORAL_TABLET | Freq: Three times a day (TID) | ORAL | 3 refills | Status: DC | PRN

## 2020-06-13 MED ORDER — OZEMPIC (0.25 OR 0.5 MG/DOSE) 2 MG/1.5ML ~~LOC~~ SOPN: 0.2500 mg | PEN_INJECTOR | SUBCUTANEOUS | 0 refills | Status: DC

## 2020-06-13 NOTE — Patient Instructions (Addendum)
Thank you for coming to the office today.  For osteoporosis, referral sent. Stay tuned.  [facility] [place], [place]  [address] Phone: [phone]  Trial on Ozempic injection once weekly, start with 0.25mg  dosage weekly for 4 weeks, then increase to 0.5mg  for last 2 weeks and then it will be empty, only 6 weeks.  Let me know by message or phone if it is helpful and you would like [place] to order more - depending on the insurance it may be slightly difference but it is the same medicine.  Start taking Baclofen (Lioresal) 10mg  (muscle relaxant) - start with half (cut) to one whole pill at night as needed for next 1-3 nights (may make you drowsy, caution with driving) see how it affects you, then if tolerated increase to one pill 2 to 3 times a day or (every 8 hours as needed)   Please schedule a Follow-up Appointment to: Return in about 3 months (around 09/13/2020) for 3 month weight management / back pain.  If you have any other questions or concerns, please feel free to call the office or send a message through [place]. You may also schedule an earlier appointment if necessary.  Additionally, you may be receiving a survey about your experience at our office within a few days to 1 week by e-mail or mail. We value your feedback.  Nobie Putnam, DO [employer]

## 2020-06-13 NOTE — Progress Notes (Signed)
Subjective:    Patient ID: Kelly Weber, female    DOB: 30-Apr-1945, 75 y.o.   MRN: [id number]  Kelly Weber is a 75 y.o. female presenting on 06/13/2020 for Back Pain and Obesity   HPI   Osteoporosis Lumbar spine T-2.5, similar to 2015 Failed oral bisphosphonate in past. Limited result. Request refer for IV bisphonate therapy.  Chronic Lumbar / Back Pain Worse if active, bending and moving. Similar to prior episodes, has had intermittent pain low back feels like strain and tight muscles spasm. In past on baclofen, request re order.  Elevated A1c Result 5.5 now Goal to improve diet  Morbid Obesity BMI >37 Goal for weight loss, she has tried most lifestyle options and diet changes, requesting medication option   Depression screen [facility] 2/9 01/30/2020 08/28/2019 01/29/2019  Decreased Interest 1 1 1   Down, Depressed, Hopeless 1 0 0  PHQ - 2 Score 2 1 1   Altered sleeping 0 0 1  Tired, decreased energy 1 1 1   Change in appetite 0 0 0  Feeling bad or failure about yourself  0 0 0  Trouble concentrating 0 0 0  Moving slowly or fidgety/restless 0 0 0  Suicidal thoughts 0 0 0  PHQ-9 Score 3 2 3   Difficult doing work/chores Not difficult at all Not difficult at all Not difficult at all  Some recent data might be hidden    Social History   Tobacco Use  . Smoking status: Former Smoker    Packs/day: 1.00    Years: 30.00    Pack years: 30.00    Types: Cigarettes    Quit date: 01/19/2014    Years since quitting: 6.4  . Smokeless tobacco: Former Network engineer  . Vaping Use: Never used  Substance Use Topics  . Alcohol use: No    Alcohol/week: 0.0 standard drinks  . Drug use: No    Review of Systems Per HPI unless specifically indicated above     Objective:    BP (!) 126/96   Pulse 65   Ht 5\' 4"  (1.626 m)   Wt 219 lb 12.8 oz (99.7 kg)   SpO2 98%   BMI 37.73 kg/m   Wt Readings from Last 3 Encounters:  06/13/20 219 lb 12.8 oz (99.7 kg)  01/30/20 216 lb (98  kg)  10/23/19 216 lb (98 kg)    Physical Exam Vitals and nursing note reviewed.  Constitutional:      General: She is not in acute distress.    Appearance: She is well-developed and well-nourished. She is obese. She is not diaphoretic.     Comments: Well-appearing, comfortable, cooperative  HENT:     Head: Normocephalic and atraumatic.     Mouth/Throat:     Mouth: Oropharynx is clear and moist.  Eyes:     General:        Right eye: No discharge.        Left eye: No discharge.     Conjunctiva/sclera: Conjunctivae normal.  Cardiovascular:     Rate and Rhythm: Normal rate.  Pulmonary:     Effort: Pulmonary effort is normal.  Musculoskeletal:        General: No edema.  Skin:    General: Skin is warm and dry.     Findings: No erythema or rash.  Neurological:     Mental Status: She is alert and oriented to person, place, and time.  Psychiatric:        Mood and Affect:  Mood and affect normal.        Behavior: Behavior normal.     Comments: Well groomed, good eye contact, normal speech and thoughts      I have personally reviewed the radiology report from 03/18/20.  DUAL X-RAY ABSORPTIOMETRY (DXA) FOR BONE MINERAL DENSITY  IMPRESSION: crr  Your patient Kelly Weber completed a BMD test on 03/18/2020 using the [place] (analysis version: 14.10) manufactured by [employer]. The following summarizes the results of our evaluation.  PATIENT BIOGRAPHICAL: Name: Kelly Weber Patient ID: [id number] Birth Date: Mar 01, 1946 Height: 63.7 in. Gender: Female Exam Date: 03/18/2020 Weight: 219.4 lbs. Indications: Advanced Age, arthritis, Caucasian, copd, Height Loss, History of Fracture (Adult), hx skin ca, POSTmenopausal Fractures: coccyx, rt wrist, toe Treatments: albuterol, Calcium, multivitamin  ASSESSMENT: The BMD measured at Femur Neck Left is 0.686 g/cm2 with a T-score of -2.5.  This patient is considered OSTEOPOROTIC according to [facility]) criteria.  The scan quality is good.  Patient is not a candidate for FRAX assessment due to osteoporosis report.  Site Region Measured Measured WHO Young Adult BMD Date       Age      Classification T-score  AP Spine L1-L4 3/18/[age] 74.8 Osteopenia -2.0 0.937 g/cm2  DualFemur Neck Left 03/18/2020 74.8 Osteoporosis -2.5 0.686 g/cm2  World Health Organization [facility]) criteria for post-menopausal, Caucasian Women: Normal:       T-score at or above -1 SD Osteopenia:   T-score between -1 and -2.5 SD Osteoporosis: T-score at or below -2.5 SD  RECOMMENDATIONS: 1. All patients should optimize calcium and vitamin D intake. 2. Consider FDA-approved medical therapies in postmenopausal women and men aged 50 years and older, based on the following: a. A hip or vertebral (clinical or morphometric) fracture b. T-score < -2.5 at the femoral neck or spine after appropriate evaluation to exclude secondary causes c. Low bone mass (T-score between -1.0 and -2.5 at the femoral neck or spine) and a 10-year probability of a hip fracture > 3% or a 10-year probability of a major osteoporosis-related fracture > 20% based on the [place]-adapted WHO algorithm d. Clinician judgment and/or patient preferences may indicate treatment for people with 10-year fracture probabilities above or below these levels  FOLLOW-UP: People with diagnosed cases of osteoporosis or at high risk for fracture should have regular bone mineral density tests. For patients eligible for Medicare, routine testing is allowed once every 2 years. The testing frequency can be increased to one year for patients who have rapidly progressing disease, those who are receiving or discontinuing medical therapy to restore bone mass, or have additional risk factors.  I have reviewed this report, and agree with the above findings.  Mark A. Thornton Papas, M.D. [facility]   Electronically Signed   By:  Lavonia Dana M.D.   On: 03/18/2020 20:18  Results for orders placed or performed in visit on 01/22/20  VITAMIN D 25 Hydroxy (Vit-D Deficiency, Fractures)  Result Value Ref Range   Vit D, 25-Hydroxy 30 30 - 100 ng/mL  TSH  Result Value Ref Range   TSH 2.57 0.40 - 4.50 mIU/L  Lipid panel  Result Value Ref Range   Cholesterol 185 <200 mg/dL   HDL 55 > OR = 50 mg/dL   Triglycerides 117 <150 mg/dL   LDL Cholesterol (Calc) 108 (H) mg/dL (calc)   Total CHOL/HDL Ratio 3.4 <5.0 (calc)   Non-HDL Cholesterol (Calc) 130 (H) <130 mg/dL (calc)  COMPLETE METABOLIC PANEL  WITH GFR  Result Value Ref Range   Glucose, Bld 92 65 - 99 mg/dL   BUN 14 7 - 25 mg/dL   Creat 0.80 0.60 - 0.93 mg/dL   GFR, Est Non African American 73 > OR = 60 mL/min/1.37m2   GFR, Est African American 84 > OR = 60 mL/min/1.60m2   BUN/Creatinine Ratio NOT APPLICABLE 6 - 22 (calc)   Sodium 140 135 - 146 mmol/L   Potassium 4.1 3.5 - 5.3 mmol/L   Chloride 104 98 - 110 mmol/L   CO2 27 20 - 32 mmol/L   Calcium 8.6 8.6 - 10.4 mg/dL   Total Protein 6.2 6.1 - 8.1 g/dL   Albumin 3.7 3.6 - 5.1 g/dL   Globulin 2.5 1.9 - 3.7 g/dL (calc)   AG Ratio 1.5 1.0 - 2.5 (calc)   Total Bilirubin 0.5 0.2 - 1.2 mg/dL   Alkaline phosphatase (APISO) 84 37 - 153 U/L   AST 16 10 - 35 U/L   ALT 8 6 - 29 U/L  CBC with Differential/Platelet  Result Value Ref Range   WBC 4.6 3.8 - 10.8 Thousand/uL   RBC 4.28 3.80 - 5.10 Million/uL   Hemoglobin 11.6 (L) 11.7 - 15.5 g/dL   HCT 37.1 35.0 - 45.0 %   MCV 86.7 80.0 - 100.0 fL   MCH 27.1 27.0 - 33.0 pg   MCHC 31.3 (L) 32.0 - 36.0 g/dL   RDW 13.8 11.0 - 15.0 %   Platelets 275 140 - 400 Thousand/uL   MPV 10.4 7.5 - 12.5 fL   Neutro Abs 2,093 1,500 - 7,800 cells/uL   Lymphs Abs 1,978 850 - 3,900 cells/uL   Absolute Monocytes 382 200 - 950 cells/uL   Eosinophils Absolute 129 15 - 500 cells/uL   Basophils Absolute 18 0 - 200 cells/uL   Neutrophils Relative % 45.5 %   Total Lymphocyte 43.0 %    Monocytes Relative 8.3 %   Eosinophils Relative 2.8 %   Basophils Relative 0.4 %  Hemoglobin A1c  Result Value Ref Range   Hgb A1c MFr Bld 5.5 <5.7 % of total Hgb   Mean Plasma Glucose 111 (calc)   eAG (mmol/L) 6.2 (calc)      Assessment & Plan:   Problem List Items Addressed This Visit    Osteoporosis of lumbar spine - Primary   Relevant Orders   Ambulatory referral to Endocrinology   Morbid obesity (HCC)   Relevant Medications   OZEMPIC, 0.25 OR 0.5 MG/DOSE, 2 MG/1.5ML SOPN   Elevated hemoglobin A1c   Chronic bilateral low back pain without sciatica   Relevant Medications   baclofen (LIORESAL) 10 MG tablet      #Chronic Low Back Pain Similar to prior flare History of OA/DJD Re order Baclofen 5-10mg  TID PRN caution sedation Follow-up  #Elevated A1c Result 5.5, prior 5.3 range Counseling on improving lifestyle low carb.  #Osteoporosis Last DEXA 03/2020 T score -2.5, prior similar 2015, has failed oral bisphosphonate therapy in past Referral to Endocrinology for further advanced OP management discussion options  #Morbid Obesity BMI >37 with co morbid hyperlipidemia, Osteoporosis, Elevated A1c Discussion on options, agree to trial Ozempic sample 0.25 x4 week then up to 0.5mg  weekly x 2 for 6 doses, if doing well notify office we can try to order for her, if not covered or indicated we could order [facility] option.  Meds ordered this encounter  Medications  . baclofen (LIORESAL) 10 MG tablet    Sig: Take 0.5-1 tablets (  5-10 mg total) by mouth 3 (three) times daily as needed for muscle spasms.    Dispense:  30 each    Refill:  3  . OZEMPIC, 0.25 OR 0.5 MG/DOSE, 2 MG/1.5ML SOPN    Sig: Inject 0.25 mg into the skin once a week. For first 4 weeks. Then increase dose to 0.5mg  weekly    Dispense:  1.5 mL    Refill:  0      Follow up plan: Return in about 3 months (around 09/13/2020) for 3 month weight management / back pain.  Nobie Putnam, [facility] 06/13/2020, 2:35 PM

## 2020-06-30 ENCOUNTER — Ambulatory Visit: Payer: Medicare HMO | Admitting: Family Medicine

## 2020-07-31 ENCOUNTER — Other Ambulatory Visit: Payer: Self-pay | Admitting: Family Medicine

## 2020-07-31 DIAGNOSIS — L538 Other specified erythematous conditions: Secondary | ICD-10-CM | POA: Diagnosis not present

## 2020-07-31 DIAGNOSIS — L820 Inflamed seborrheic keratosis: Secondary | ICD-10-CM | POA: Diagnosis not present

## 2020-07-31 DIAGNOSIS — Z85828 Personal history of other malignant neoplasm of skin: Secondary | ICD-10-CM | POA: Diagnosis not present

## 2020-07-31 DIAGNOSIS — D2271 Melanocytic nevi of right lower limb, including hip: Secondary | ICD-10-CM | POA: Diagnosis not present

## 2020-07-31 DIAGNOSIS — D2262 Melanocytic nevi of left upper limb, including shoulder: Secondary | ICD-10-CM | POA: Diagnosis not present

## 2020-07-31 DIAGNOSIS — D2261 Melanocytic nevi of right upper limb, including shoulder: Secondary | ICD-10-CM | POA: Diagnosis not present

## 2020-07-31 DIAGNOSIS — Z1231 Encounter for screening mammogram for malignant neoplasm of breast: Secondary | ICD-10-CM

## 2020-08-12 DIAGNOSIS — H25811 Combined forms of age-related cataract, right eye: Secondary | ICD-10-CM | POA: Diagnosis not present

## 2020-08-12 DIAGNOSIS — H43813 Vitreous degeneration, bilateral: Secondary | ICD-10-CM | POA: Diagnosis not present

## 2020-08-12 DIAGNOSIS — H524 Presbyopia: Secondary | ICD-10-CM | POA: Diagnosis not present

## 2020-08-12 DIAGNOSIS — Z0100 Encounter for examination of eyes and vision without abnormal findings: Secondary | ICD-10-CM | POA: Diagnosis not present

## 2020-08-12 DIAGNOSIS — H40013 Open angle with borderline findings, low risk, bilateral: Secondary | ICD-10-CM | POA: Diagnosis not present

## 2020-08-12 DIAGNOSIS — H25812 Combined forms of age-related cataract, left eye: Secondary | ICD-10-CM | POA: Diagnosis not present

## 2020-08-15 DIAGNOSIS — E559 Vitamin D deficiency, unspecified: Secondary | ICD-10-CM | POA: Diagnosis not present

## 2020-08-15 DIAGNOSIS — M810 Age-related osteoporosis without current pathological fracture: Secondary | ICD-10-CM | POA: Diagnosis not present

## 2020-08-21 ENCOUNTER — Telehealth: Payer: Self-pay | Admitting: Family Medicine

## 2020-08-21 NOTE — Telephone Encounter (Signed)
Pt called to see if provider would call her some medication for a sinus infection/ please advise

## 2020-08-22 ENCOUNTER — Ambulatory Visit
Admission: EM | Admit: 2020-08-22 | Discharge: 2020-08-22 | Disposition: A | Discharge status: Home / Self Care | Payer: Medicare HMO | Attending: Physician Assistant | Admitting: Physician Assistant

## 2020-08-22 ENCOUNTER — Encounter: Payer: Self-pay | Admitting: Emergency Medicine

## 2020-08-22 ENCOUNTER — Other Ambulatory Visit: Payer: Self-pay

## 2020-08-22 DIAGNOSIS — J441 Chronic obstructive pulmonary disease with (acute) exacerbation: Secondary | ICD-10-CM

## 2020-08-22 DIAGNOSIS — R059 Cough, unspecified: Secondary | ICD-10-CM | POA: Diagnosis not present

## 2020-08-22 DIAGNOSIS — J209 Acute bronchitis, unspecified: Secondary | ICD-10-CM

## 2020-08-22 DIAGNOSIS — R0981 Nasal congestion: Secondary | ICD-10-CM

## 2020-08-22 MED ORDER — AZITHROMYCIN 250 MG PO TABS
250.0000 mg | ORAL_TABLET | Freq: Every day | ORAL | 0 refills | Status: DC
Start: 2020-08-22 — End: 2020-08-27

## 2020-08-22 MED ORDER — PROMETHAZINE-DM 6.25-15 MG/5ML PO SYRP
5.0000 mL | ORAL_SOLUTION | Freq: Four times a day (QID) | ORAL | 0 refills | Status: DC | PRN
Start: 2020-08-22 — End: 2020-08-27

## 2020-08-22 NOTE — ED Provider Notes (Signed)
[facility]    CSN: [id number] Arrival date & time: 08/22/20  0907      History   Chief Complaint Chief Complaint  Patient presents with  . Cough  . Nasal Congestion    HPI Kelly Weber is a 75 y.o. female presenting for approximately 2-day history of cough, nasal congestion, chest congestion and a little bit of increased fatigue.  Patient denies any fever, sinus pain, ear pain, chest pain, shortness of breath or breathing difficulty.  She does have history of emphysema and uses albuterol inhaler as needed.  States she has not needed to use it recently.  She has been taking over-the-counter DayQuil/NyQuil without improvement in her symptoms.  Patient believes she may need a Z-Pak.  She says that always helps.  She denies any sick contacts.  No known exposure to COVID-19.  Patient not vaccinated for COVID-19 and refuses testing today.  No other concerns.  HPI  Past Medical History:  Diagnosis Date  . Allergies   . Anxiety   . Arthritis    fingers, left knee  . Depression   . Diverticulitis   . Dyspnea   . Fatigue   . GERD (gastroesophageal reflux disease)   . HLD (hyperlipidemia)   . Insomnia 2006  . Lower back pain   . Skin cancer 2007   Basal Cell CA resected from Left middle finger    Patient Active Problem List   Diagnosis Date Noted  . Chronic bilateral low back pain without sciatica 06/13/2020  . Osteoporosis of lumbar spine 01/30/2020  . Atherosclerosis of aorta ([place]) 10/24/2019  . Vitamin D deficiency 11/23/2018  . Elevated hemoglobin A1c 01/29/2018  . Hyperlipidemia 09/02/2016  . GERD (gastroesophageal reflux disease) 09/01/2016  . Chronic diarrhea 02/09/2016  . Anemia 09/29/2015  . Morbid obesity ([place]) 08/27/2015  . Centrilobular emphysema ([place]) 08/27/2015  . Syncope and collapse 08/26/2015  . SOB (shortness of breath) on exertion 08/26/2015  . Pityriasis rosea 03/12/2015  . Seasonal allergies 02/27/2015  . Hx of colonic polyps   .  Benign neoplasm of descending colon   . Irritable bowel syndrome (IBS)   . Hiatal hernia   . Gonalgia 09/23/2014  . Arthritis 09/23/2014  . Major depression, recurrent, full remission ([place]) 09/23/2014  . Dysesthesia 09/23/2014  . Insomnia due to medical condition 09/23/2014  . Acne 09/23/2014  . H/O malignant neoplasm of skin 09/23/2014  . Compulsive tobacco user syndrome 09/23/2014  . Hematuria, microscopic 09/23/2014  . Seborrheic keratoses, inflamed 09/23/2014    Past Surgical History:  Procedure Laterality Date  . BREAST CYST ASPIRATION    . COLONOSCOPY WITH PROPOFOL N/A 11/20/2014   Procedure: COLONOSCOPY WITH PROPOFOL;  Surgeon: Lucilla Lame, MD;  Location: [place];  Service: Endoscopy;  Laterality: N/A;  . ESOPHAGOGASTRODUODENOSCOPY (EGD) WITH PROPOFOL N/A 11/20/2014   Procedure: ESOPHAGOGASTRODUODENOSCOPY (EGD) WITH PROPOFOL;  Surgeon: Lucilla Lame, MD;  Location: [place];  Service: Endoscopy;  Laterality: N/A;  . POLYPECTOMY  11/20/2014   Procedure: POLYPECTOMY;  Surgeon: Lucilla Lame, MD;  Location: [place];  Service: Endoscopy;;  . SKIN CANCER EXCISION Left    finger  . TUBAL LIGATION  1970    OB History    Gravida  3   Para      Term      Preterm      AB      Living  3     SAB      IAB  Ectopic      Multiple      Live Births               Home Medications    Prior to Admission medications   Medication Sig Start Date End Date Taking? Authorizing Provider  azithromycin (ZITHROMAX) 250 MG tablet Take 1 tablet (250 mg total) by mouth daily. Take first 2 tablets together, then 1 every day until finished. 08/22/20  Yes Danton Clap, PA-C  Calcium Carb-Cholecalciferol 600-800 MG-UNIT TABS Take 1 tablet by mouth daily.    Yes [provider]  cetirizine (ZYRTEC) 10 MG chewable tablet Chew 10 mg by mouth daily.   Yes [provider]  fluticasone (FLONASE) 50 MCG/ACT nasal spray PLACE 2 SPRAYS  INTO BOTH NOSTRILS DAILY. USE FOR 4-6 WEEKS THEN STOP AND USE SEASONALLY OR AS NEEDED 07/06/18  Yes Karamalegos, Devonne Doughty, DO  Multiple Vitamin (MULTI-VITAMINS) TABS Take 1 tablet by mouth daily.    Yes [provider]  promethazine-dextromethorphan (PROMETHAZINE-DM) 6.25-15 MG/5ML syrup Take 5 mLs by mouth 4 (four) times daily as needed for cough. 08/22/20  Yes Danton Clap, PA-C  venlafaxine XR (EFFEXOR-XR) 37.5 MG 24 hr capsule Take 1 capsule (37.5 mg total) by mouth daily with breakfast. 01/30/20  Yes Karamalegos, Devonne Doughty, DO  albuterol (VENTOLIN HFA) 108 (90 Base) MCG/ACT inhaler Inhale 2 puffs into the lungs every 6 (six) hours as needed for wheezing or shortness of breath. 01/30/20   Karamalegos, Devonne Doughty, DO  azelastine (ASTELIN) 0.1 % nasal spray USE 1 2 SPRAYS IN EACH NOSTRIL TWICE DAILY 07/25/19   [provider]  baclofen (LIORESAL) 10 MG tablet Take 0.5-1 tablets (5-10 mg total) by mouth 3 (three) times daily as needed for muscle spasms. 06/13/20   Karamalegos, Alexander J, DO  OZEMPIC, 0.25 OR 0.5 MG/DOSE, 2 MG/1.5ML SOPN Inject 0.25 mg into the skin once a week. For first 4 weeks. Then increase dose to 0.5mg  weekly 06/13/20   Olin Hauser, DO  Skin Protectants, Misc. (EUCERIN) cream Apply topically as needed for dry skin.    [provider]  valACYclovir (VALTREX) 1000 MG tablet Take 2,000 mg by mouth 2 (two) times daily as needed (fever blisters / rash).     [provider]    Family History Family History  Problem Relation Age of Onset  . Brain cancer Mother   . Thyroid disease Mother   . Hypertension Mother   . Breast cancer Maternal Aunt 26  . Breast cancer Cousin 48  . Breast cancer Maternal Aunt     Social History Social History   Tobacco Use  . Smoking status: Former Smoker    Packs/day: 1.00    Years: 30.00    Pack years: 30.00    Types: Cigarettes    Quit date: 01/19/2014    Years since quitting: 6.5  .  Smokeless tobacco: Former Network engineer  . Vaping Use: Never used  Substance Use Topics  . Alcohol use: No    Alcohol/week: 0.0 standard drinks  . Drug use: No     Allergies   Penicillins   Review of Systems Review of Systems  Constitutional: Positive for fatigue. Negative for chills, diaphoresis and fever.  HENT: Positive for congestion and rhinorrhea. Negative for ear pain, sinus pressure, sinus pain and sore throat.   Respiratory: Positive for cough. Negative for shortness of breath and wheezing.   Cardiovascular: Negative for chest pain.  Gastrointestinal: Negative for abdominal  pain, nausea and vomiting.  Musculoskeletal: Negative for arthralgias and myalgias.  Skin: Negative for rash.  Neurological: Negative for weakness and headaches.  Hematological: Negative for adenopathy.     Physical Exam Triage Vital Signs ED Triage Vitals  Enc Vitals Group     BP 08/22/20 0921 130/75     Pulse Rate 08/22/20 0921 80     Resp 08/22/20 0921 14     Temp 08/22/20 0921 97.9 F (36.6 C)     Temp Source 08/22/20 0921 Oral     SpO2 08/22/20 0921 99 %     Weight 08/22/20 0918 214 lb (97.1 kg)     Height 08/22/20 0918 5\' 4"  (1.626 m)     Head Circumference --      Peak Flow --      Pain Score 08/22/20 0918 0     Pain Loc --      Pain Edu? --      Excl. in [place]? --    No data found.  Updated Vital Signs BP 130/75 (BP Location: Left Arm)   Pulse 80   Temp 97.9 F (36.6 C) (Oral)   Resp 14   Ht 5\' 4"  (1.626 m)   Wt 214 lb (97.1 kg)   SpO2 99%   BMI 36.73 kg/m      Physical Exam Vitals and nursing note reviewed.  Constitutional:      General: She is not in acute distress.    Appearance: Normal appearance. She is not ill-appearing or toxic-appearing.  HENT:     Head: Normocephalic and atraumatic.     Nose: Congestion and rhinorrhea present.     Mouth/Throat:     Mouth: Mucous membranes are moist.     Pharynx: Oropharynx is clear.  Eyes:     General: No scleral  icterus.       Right eye: No discharge.        Left eye: No discharge.     Conjunctiva/sclera: Conjunctivae normal.  Cardiovascular:     Rate and Rhythm: Normal rate and regular rhythm.     Heart sounds: Normal heart sounds.  Pulmonary:     Effort: Pulmonary effort is normal. No respiratory distress.     Breath sounds: Wheezing (few scattered wheezes of LLL and RLL) present.  Musculoskeletal:     Cervical back: Neck supple.  Skin:    General: Skin is dry.  Neurological:     General: No focal deficit present.     Mental Status: She is alert. Mental status is at baseline.     Motor: No weakness.     Gait: Gait normal.  Psychiatric:        Mood and Affect: Mood normal.        Behavior: Behavior normal.        Thought Content: Thought content normal.      UC Treatments / Results  Labs (all labs ordered are listed, but only abnormal results are displayed) Labs Reviewed - No data to display  EKG   Radiology No results found.  Procedures Procedures (including critical care time)  Medications Ordered in UC Medications - No data to display  Initial Impression / Assessment and Plan / UC Course  I have reviewed the triage vital signs and the nursing notes.  Pertinent labs & imaging results that were available during my care of the patient were reviewed by me and considered in my medical decision making (see chart for details).   75 year old female presenting  for 3-day history of cough, congestion, and fatigue.  Patient does have history of emphysema.  Vital signs are all stable today.  On exam she does have some minor nasal congestion and clear rhinorrhea.  She does have a few wheezes throughout the lower lung bases bilaterally.  She is in no respiratory distress though.  I did advise patient to get a COVID test but she declined again.  Advised her she most likely has a viral illness, but since she does have history of underlying emphysema, will cover for COPD exacerbation  at this time with azithromycin.  Have also sent Promethazine DM and encouraged her to increase rest and fluids.  Advised to follow-up for any worsening symptoms or if not better over the next 10 days.   Final Clinical Impressions(s) / UC Diagnoses   Final diagnoses:  Acute bronchitis, unspecified organism  COPD exacerbation (HCC)  Cough  Nasal congestion     Discharge Instructions     As we discussed, you are illness is most likely viral.  You have declined COVID testing today.  I would advise considering reexamination and COVID testing if your cough gets worse, you have fever or chest pain or breathing difficulty.  At this time, will cover you for an exacerbation of your underlying lung disease with azithromycin.  Have also sent a cough syrup.  Increase rest and fluids and use your albuterol inhaler as needed for any wheezing or shortness of breath.  Please follow-up for any worsening symptoms or if you are not better over the next 10 days.    ED Prescriptions    Medication Sig Dispense Auth. Provider   azithromycin (ZITHROMAX) 250 MG tablet Take 1 tablet (250 mg total) by mouth daily. Take first 2 tablets together, then 1 every day until finished. 6 tablet Danton Clap, PA-C   promethazine-dextromethorphan (PROMETHAZINE-DM) 6.25-15 MG/5ML syrup Take 5 mLs by mouth 4 (four) times daily as needed for cough. 118 mL Danton Clap, PA-C     PDMP not reviewed this encounter.   Danton Clap, PA-C 08/22/20 [id number]

## 2020-08-22 NOTE — ED Triage Notes (Signed)
Patient c/o cough, chest congestion and nasal congestion that started 3 days ago.  Patient does not want to be tested for covid.  Patient denies fevers.

## 2020-08-22 NOTE — Discharge Instructions (Signed)
As we discussed, you are illness is most likely viral.  You have declined COVID testing today.  I would advise considering reexamination and COVID testing if your cough gets worse, you have fever or chest pain or breathing difficulty.  At this time, will cover you for an exacerbation of your underlying lung disease with azithromycin.  Have also sent a cough syrup.  Increase rest and fluids and use your albuterol inhaler as needed for any wheezing or shortness of breath.  Please follow-up for any worsening symptoms or if you are not better over the next 10 days.

## 2020-08-22 NOTE — Telephone Encounter (Signed)
Spoke to pt she went to [facility] today 5/13. She stated UC gave her medication.  KP

## 2020-08-27 ENCOUNTER — Ambulatory Visit (INDEPENDENT_AMBULATORY_CARE_PROVIDER_SITE_OTHER): Payer: Medicare HMO | Admitting: Family Medicine

## 2020-08-27 ENCOUNTER — Other Ambulatory Visit: Payer: Self-pay

## 2020-08-27 ENCOUNTER — Encounter: Payer: Self-pay | Admitting: Family Medicine

## 2020-08-27 VITALS — BP 118/61 | HR 91 | Ht 64.0 in | Wt 214.0 lb

## 2020-08-27 DIAGNOSIS — J432 Centrilobular emphysema: Secondary | ICD-10-CM | POA: Diagnosis not present

## 2020-08-27 DIAGNOSIS — J440 Chronic obstructive pulmonary disease with acute lower respiratory infection: Secondary | ICD-10-CM

## 2020-08-27 DIAGNOSIS — J209 Acute bronchitis, unspecified: Secondary | ICD-10-CM

## 2020-08-27 MED ORDER — PREDNISONE 20 MG PO TABS: ORAL_TABLET | ORAL | 0 refills | Status: DC

## 2020-08-27 MED ORDER — LEVOFLOXACIN 500 MG PO TABS: 500.0000 mg | ORAL_TABLET | Freq: Every day | ORAL | 0 refills | Status: DC

## 2020-08-27 NOTE — Patient Instructions (Addendum)
Thank you for coming to the office today.  Likely COPD Emphysema  Start taking Levaquin antibiotic 500mg  daily x 7 days  Start Prednisone burst taper over 7 days   Use the albuterol inhaler as needed.  Use the sample inhaler as advised for maintenance if it is helpful we can order more.  Please schedule a Follow-up Appointment to: Return if symptoms worsen or fail to improve.  If you have any other questions or concerns, please feel free to call the office or send a message through [place]. You may also schedule an earlier appointment if necessary.  Additionally, you may be receiving a survey about your experience at our office within a few days to 1 week by e-mail or mail. We value your feedback.  Nobie Putnam, DO [place]

## 2020-08-27 NOTE — Progress Notes (Signed)
Subjective:    Patient ID: Kelly Weber, female    DOB: Nov 23, 1945, 75 y.o.   MRN: [id number]  Kelly Weber is a 75 y.o. female presenting on 08/27/2020 for Cough and chest congestion   HPI  Centrilobular Emphysema COPD Exacerbation / Bronchitis Reports onset about 8 days ago, with chest congestion and cough, sinus symptoms, she went to urgent care [facility] on 08/22/20, had evaluation there, treated for bronchitis with Azithromycin and Cough Syrup. Did not have chest x-ray. She refused testing for COVID at that time. - She admits chronic nasal drainage, for years. Continues to use Albuterol PRN some relief.   Depression screen [facility] 2/9 08/27/2020 01/30/2020 08/28/2019  Decreased Interest 0 1 1  Down, Depressed, Hopeless 0 1 0  PHQ - 2 Score 0 2 1  Altered sleeping 0 0 0  Tired, decreased energy 0 1 1  Change in appetite 0 0 0  Feeling bad or failure about yourself  0 0 0  Trouble concentrating 0 0 0  Moving slowly or fidgety/restless 0 0 0  Suicidal thoughts 0 0 0  PHQ-9 Score 0 3 2  Difficult doing work/chores Not difficult at all Not difficult at all Not difficult at all  Some recent data might be hidden    Social History   Tobacco Use  . Smoking status: Former Smoker    Packs/day: 1.00    Years: 30.00    Pack years: 30.00    Types: Cigarettes    Quit date: 01/19/2014    Years since quitting: 6.6  . Smokeless tobacco: Former Network engineer  . Vaping Use: Never used  Substance Use Topics  . Alcohol use: No    Alcohol/week: 0.0 standard drinks  . Drug use: No    Review of Systems Per HPI unless specifically indicated above     Objective:    BP 118/61   Pulse 91   Ht 5\' 4"  (1.626 m)   Wt 214 lb (97.1 kg)   SpO2 96%   BMI 36.73 kg/m   Wt Readings from Last 3 Encounters:  08/27/20 214 lb (97.1 kg)  08/22/20 214 lb (97.1 kg)  06/13/20 219 lb 12.8 oz (99.7 kg)    Physical Exam Vitals and nursing note reviewed.  Constitutional:      General:  She is not in acute distress.    Appearance: She is well-developed. She is not diaphoretic.     Comments: Well-appearing, comfortable, cooperative  HENT:     Head: Normocephalic and atraumatic.  Eyes:     General:        Right eye: No discharge.        Left eye: No discharge.     Conjunctiva/sclera: Conjunctivae normal.  Neck:     Thyroid: No thyromegaly.  Cardiovascular:     Rate and Rhythm: Normal rate and regular rhythm.     Heart sounds: Normal heart sounds. No murmur heard.   Pulmonary:     Effort: Pulmonary effort is normal. No respiratory distress.     Breath sounds: Wheezing present. No rales.  Musculoskeletal:        General: Normal range of motion.     Cervical back: Normal range of motion and neck supple.  Lymphadenopathy:     Cervical: No cervical adenopathy.  Skin:    General: Skin is warm and dry.     Findings: No erythema or rash.  Neurological:     Mental Status: She is alert  and oriented to person, place, and time.  Psychiatric:        Behavior: Behavior normal.     Comments: Well groomed, good eye contact, normal speech and thoughts    Results for orders placed or performed in visit on 01/22/20  VITAMIN D 25 Hydroxy (Vit-D Deficiency, Fractures)  Result Value Ref Range   Vit D, 25-Hydroxy 30 30 - 100 ng/mL  TSH  Result Value Ref Range   TSH 2.57 0.40 - 4.50 mIU/L  Lipid panel  Result Value Ref Range   Cholesterol 185 <200 mg/dL   HDL 55 > OR = 50 mg/dL   Triglycerides 117 <150 mg/dL   LDL Cholesterol (Calc) 108 (H) mg/dL (calc)   Total CHOL/HDL Ratio 3.4 <5.0 (calc)   Non-HDL Cholesterol (Calc) 130 (H) <130 mg/dL (calc)  COMPLETE METABOLIC PANEL WITH GFR  Result Value Ref Range   Glucose, Bld 92 65 - 99 mg/dL   BUN 14 7 - 25 mg/dL   Creat 0.80 0.60 - 0.93 mg/dL   GFR, Est Non African American 73 > OR = 60 mL/min/1.17m2   GFR, Est African American 84 > OR = 60 mL/min/1.78m2   BUN/Creatinine Ratio NOT APPLICABLE 6 - 22 (calc)   Sodium 140 135  - 146 mmol/L   Potassium 4.1 3.5 - 5.3 mmol/L   Chloride 104 98 - 110 mmol/L   CO2 27 20 - 32 mmol/L   Calcium 8.6 8.6 - 10.4 mg/dL   Total Protein 6.2 6.1 - 8.1 g/dL   Albumin 3.7 3.6 - 5.1 g/dL   Globulin 2.5 1.9 - 3.7 g/dL (calc)   AG Ratio 1.5 1.0 - 2.5 (calc)   Total Bilirubin 0.5 0.2 - 1.2 mg/dL   Alkaline phosphatase (APISO) 84 37 - 153 U/L   AST 16 10 - 35 U/L   ALT 8 6 - 29 U/L  CBC with Differential/Platelet  Result Value Ref Range   WBC 4.6 3.8 - 10.8 Thousand/uL   RBC 4.28 3.80 - 5.10 Million/uL   Hemoglobin 11.6 (L) 11.7 - 15.5 g/dL   HCT 37.1 35.0 - 45.0 %   MCV 86.7 80.0 - 100.0 fL   MCH 27.1 27.0 - 33.0 pg   MCHC 31.3 (L) 32.0 - 36.0 g/dL   RDW 13.8 11.0 - 15.0 %   Platelets 275 140 - 400 Thousand/uL   MPV 10.4 7.5 - 12.5 fL   Neutro Abs 2,093 1,500 - 7,800 cells/uL   Lymphs Abs 1,978 850 - 3,900 cells/uL   Absolute Monocytes 382 200 - 950 cells/uL   Eosinophils Absolute 129 15 - 500 cells/uL   Basophils Absolute 18 0 - 200 cells/uL   Neutrophils Relative % 45.5 %   Total Lymphocyte 43.0 %   Monocytes Relative 8.3 %   Eosinophils Relative 2.8 %   Basophils Relative 0.4 %  Hemoglobin A1c  Result Value Ref Range   Hgb A1c MFr Bld 5.5 <5.7 % of total Hgb   Mean Plasma Glucose 111 (calc)   eAG (mmol/L) 6.2 (calc)      Assessment & Plan:   Problem List Items Addressed This Visit    Centrilobular emphysema (HCC)   Relevant Medications   predniSONE (DELTASONE) 20 MG tablet    Other Visit Diagnoses    Acute bronchitis with COPD ([place])    -  Primary   Relevant Medications   levofloxacin (LEVAQUIN) 500 MG tablet   predniSONE (DELTASONE) 20 MG tablet      Consistent with  mild acute exacerbation of COPD with worsening productive cough. Similar to prior exacerbations. Trigger with viral or allergic URI bronchitis. - No hypoxia (96% on RA), afebrile, no recent hospitalization  Plan: 1. Start taking Levaquin antibiotic 500mg  daily x 7 days 2.  Start  Prednisone 7 day course taper 3. Use albuterol q 4 hr regularly x 2-3 days - new sample Spiriva 1.53mcg 1-2 puffs daily can order if effective for her.  Consider CXR RTC about 1 week if not improving, otherwise strict return criteria to go to ED   Meds ordered this encounter  Medications  . levofloxacin (LEVAQUIN) 500 MG tablet    Sig: Take 1 tablet (500 mg total) by mouth daily. For 7 days    Dispense:  7 tablet    Refill:  0  . predniSONE (DELTASONE) 20 MG tablet    Sig: Take daily with food. Start with 60mg  (3 pills) x 2 days, then reduce to 40mg  (2 pills) x 2 days, then 20mg  (1 pill) x 3 days    Dispense:  13 tablet    Refill:  0      Follow up plan: Return if symptoms worsen or fail to improve.   Nobie Putnam, [facility] 08/27/2020, 3:19 PM

## 2020-09-19 DIAGNOSIS — M810 Age-related osteoporosis without current pathological fracture: Secondary | ICD-10-CM | POA: Diagnosis not present

## 2020-09-23 ENCOUNTER — Ambulatory Visit (INDEPENDENT_AMBULATORY_CARE_PROVIDER_SITE_OTHER): Payer: Medicare HMO | Admitting: Family Medicine

## 2020-09-23 ENCOUNTER — Other Ambulatory Visit: Payer: Self-pay

## 2020-09-23 ENCOUNTER — Encounter: Payer: Self-pay | Admitting: Family Medicine

## 2020-09-23 VITALS — BP 128/64 | HR 76 | Ht 64.0 in | Wt 212.4 lb

## 2020-09-23 DIAGNOSIS — G8929 Other chronic pain: Secondary | ICD-10-CM

## 2020-09-23 DIAGNOSIS — M545 Low back pain, unspecified: Secondary | ICD-10-CM | POA: Diagnosis not present

## 2020-09-23 DIAGNOSIS — M7551 Bursitis of right shoulder: Secondary | ICD-10-CM | POA: Diagnosis not present

## 2020-09-23 MED ORDER — NAPROXEN 500 MG PO TABS: 500.0000 mg | ORAL_TABLET | Freq: Two times a day (BID) | ORAL | 2 refills | Status: DC

## 2020-09-23 NOTE — Progress Notes (Signed)
Subjective:    Patient ID: Kelly Weber, female    DOB: 1946-01-15, 74 y.o.   MRN: [id number]  Kelly Weber is a 75 y.o. female presenting on 09/23/2020 for Weight Check and Back Pain   HPI  Chronic Lumbar / Back Pain Update since last visit, still using Baclofen muscle relaxant PRN. She has adjusted her daily activities and working on getting an upright vacuum and also husband doing more housework. Worse if active, bending and moving. Similar to prior episodes, has had intermittent pain low back feels like strain and tight muscles spasm.  Right Shoulder Pain vs Bursitis 2 weeks ago, not sure cause of injury, we think from lifting body up off toilet without handle bar or support, with R shoulder pain lifting arm above neck level. Able to lift with other arm.  Morbid Obesity BMI >36 Goal for weight loss, she has tried most lifestyle options and diet changes Now she is improving on Keto diet and Yoga exercising.   Depression screen [facility] 2/9 09/23/2020 08/27/2020 01/30/2020  Decreased Interest 0 0 1  Down, Depressed, Hopeless 0 0 1  PHQ - 2 Score 0 0 2  Altered sleeping 0 0 0  Tired, decreased energy 1 0 1  Change in appetite 0 0 0  Feeling bad or failure about yourself  0 0 0  Trouble concentrating 0 0 0  Moving slowly or fidgety/restless 0 0 0  Suicidal thoughts 0 0 0  PHQ-9 Score 1 0 3  Difficult doing work/chores Not difficult at all Not difficult at all Not difficult at all  Some recent data might be hidden    Social History   Tobacco Use   Smoking status: Former    Packs/day: 1.00    Years: 30.00    Pack years: 30.00    Types: Cigarettes    Quit date: 01/19/2014    Years since quitting: 6.6   Smokeless tobacco: Former  Scientific laboratory technician Use: Never used  Substance Use Topics   Alcohol use: No    Alcohol/week: 0.0 standard drinks   Drug use: No    Review of Systems Per HPI unless specifically indicated above     Objective:    BP 128/64   Pulse 76    Ht 5\' 4"  (1.626 m)   Wt 212 lb 6.4 oz (96.3 kg)   SpO2 94%   BMI 36.46 kg/m   Wt Readings from Last 3 Encounters:  09/23/20 212 lb 6.4 oz (96.3 kg)  08/27/20 214 lb (97.1 kg)  08/22/20 214 lb (97.1 kg)    Physical Exam Vitals and nursing note reviewed.  Constitutional:      General: She is not in acute distress.    Appearance: She is well-developed. She is not diaphoretic.     Comments: Well-appearing, comfortable, cooperative  HENT:     Head: Normocephalic and atraumatic.  Eyes:     General:        Right eye: No discharge.        Left eye: No discharge.     Conjunctiva/sclera: Conjunctivae normal.  Neck:     Thyroid: No thyromegaly.  Cardiovascular:     Rate and Rhythm: Normal rate and regular rhythm.     Heart sounds: Normal heart sounds. No murmur heard. Pulmonary:     Effort: Pulmonary effort is normal. No respiratory distress.     Breath sounds: Normal breath sounds. No wheezing or rales.  Musculoskeletal:  Cervical back: Normal range of motion and neck supple.     Comments: Right Shoulder Inspection: Normal appearance bilateral symmetrical Palpation: Tender over posterior and lateral shoulder. ROM: REduced ROM forward flexion and abduction int rotatoin Special Testing: Rotator cuff testing negative for weakness but positive for pain unable to fully test. Positive impingement. Strength: Normal strength 5/5 flex/ext, ext rot / int rot, grip, rotator cuff str testing. Neurovascular: Distally intact pulses, sensation to light touch   Lymphadenopathy:     Cervical: No cervical adenopathy.  Skin:    General: Skin is warm and dry.     Findings: No erythema or rash.  Neurological:     Mental Status: She is alert and oriented to person, place, and time.  Psychiatric:        Behavior: Behavior normal.     Comments: Well groomed, good eye contact, normal speech and thoughts     Results for orders placed or performed in visit on 01/22/20  VITAMIN D 25 Hydroxy  (Vit-D Deficiency, Fractures)  Result Value Ref Range   Vit D, 25-Hydroxy 30 30 - 100 ng/mL  TSH  Result Value Ref Range   TSH 2.57 0.40 - 4.50 mIU/L  Lipid panel  Result Value Ref Range   Cholesterol 185 <200 mg/dL   HDL 55 > OR = 50 mg/dL   Triglycerides 117 <150 mg/dL   LDL Cholesterol (Calc) 108 (H) mg/dL (calc)   Total CHOL/HDL Ratio 3.4 <5.0 (calc)   Non-HDL Cholesterol (Calc) 130 (H) <130 mg/dL (calc)  COMPLETE METABOLIC PANEL WITH GFR  Result Value Ref Range   Glucose, Bld 92 65 - 99 mg/dL   BUN 14 7 - 25 mg/dL   Creat 0.80 0.60 - 0.93 mg/dL   GFR, Est Non African American 73 > OR = 60 mL/min/1.69m2   GFR, Est African American 84 > OR = 60 mL/min/1.46m2   BUN/Creatinine Ratio NOT APPLICABLE 6 - 22 (calc)   Sodium 140 135 - 146 mmol/L   Potassium 4.1 3.5 - 5.3 mmol/L   Chloride 104 98 - 110 mmol/L   CO2 27 20 - 32 mmol/L   Calcium 8.6 8.6 - 10.4 mg/dL   Total Protein 6.2 6.1 - 8.1 g/dL   Albumin 3.7 3.6 - 5.1 g/dL   Globulin 2.5 1.9 - 3.7 g/dL (calc)   AG Ratio 1.5 1.0 - 2.5 (calc)   Total Bilirubin 0.5 0.2 - 1.2 mg/dL   Alkaline phosphatase (APISO) 84 37 - 153 U/L   AST 16 10 - 35 U/L   ALT 8 6 - 29 U/L  CBC with Differential/Platelet  Result Value Ref Range   WBC 4.6 3.8 - 10.8 Thousand/uL   RBC 4.28 3.80 - 5.10 Million/uL   Hemoglobin 11.6 (L) 11.7 - 15.5 g/dL   HCT 37.1 35.0 - 45.0 %   MCV 86.7 80.0 - 100.0 fL   MCH 27.1 27.0 - 33.0 pg   MCHC 31.3 (L) 32.0 - 36.0 g/dL   RDW 13.8 11.0 - 15.0 %   Platelets 275 140 - 400 Thousand/uL   MPV 10.4 7.5 - 12.5 fL   Neutro Abs 2,093 1,500 - 7,800 cells/uL   Lymphs Abs 1,978 850 - 3,900 cells/uL   Absolute Monocytes 382 200 - 950 cells/uL   Eosinophils Absolute 129 15 - 500 cells/uL   Basophils Absolute 18 0 - 200 cells/uL   Neutrophils Relative % 45.5 %   Total Lymphocyte 43.0 %   Monocytes Relative 8.3 %  Eosinophils Relative 2.8 %   Basophils Relative 0.4 %  Hemoglobin A1c  Result Value Ref Range   Hgb  A1c MFr Bld 5.5 <5.7 % of total Hgb   Mean Plasma Glucose 111 (calc)   eAG (mmol/L) 6.2 (calc)      Assessment & Plan:   Problem List Items Addressed This Visit     Chronic bilateral low back pain without sciatica - Primary   Relevant Medications   naproxen (NAPROSYN) 500 MG tablet   Other Visit Diagnoses     Acute bursitis of right shoulder       Relevant Medications   naproxen (NAPROSYN) 500 MG tablet       Consistent with acute R-shoulder bursitis vs rotator cuff tendinopathy with some reduced active ROM but without significant evidence of muscle tear (no weakness).  Known repetitive strain with lifting self off toilet with this arm No imaging on chart  Plan: 1. Start rx Naproxen 500mg  twice daily (with food) for 2 weeks, then as needed 2. May take Tylenol Ex Str 1-2 q 6 hr PRN 3. Relative rest but keep shoulder mobile, demonstrated ROM exercises, avoid heavy lifting 4. May try heating pad PRN 5. Follow-up 4-6 weeks if not improved for re-evaluation, consider referral to Physical Therapy, X-rays, and or subacromial steroid injection   Additionally chronic low back pain - Improved today - Use baclofen, modify activities - can use Naproxen as well for back  Morbid Obesity BMI >36 Improved wt loss on Keto diet and Yoga, continue great work.     Meds ordered this encounter  Medications   naproxen (NAPROSYN) 500 MG tablet    Sig: Take 1 tablet (500 mg total) by mouth 2 (two) times daily with a meal. For 2 weeks then as needed    Dispense:  60 tablet    Refill:  2      Follow up plan: Return in about 4 weeks (around 10/21/2020), or if symptoms worsen or fail to improve, for R shoulder bursitis.   Nobie Putnam, [facility] 09/23/2020, 2:00 PM

## 2020-09-23 NOTE — Patient Instructions (Addendum)
Thank you for coming to the office today.  Most likely you have bursitis of your shoulder. This is inflammation of the shoulder joint caused most often by arthritis or wear and tear. Often it can flare up to cause bursitis due to repetitive activities or other triggers. It may take time to heal, possibly 2 to 6 weeks, and it is important to avoid over use of shoulder especially above head motions that can re-aggravate the problem.  Recommend trial of Anti-inflammatory with Naproxen (Naprosyn) 500mg  tabs - take one with food and plenty of water TWICE daily every day (breakfast and dinner), for next 2  weeks, then you may take only as needed  - DO NOT TAKE any ibuprofen, aleve, motrin while you are taking this medicine  Recommend to start taking Tylenol Extra Strength 500mg  tabs - take 1 to 2 tabs per dose (max 1000mg ) every 6-8 hours for pain (take regularly, don't skip a dose for next 7 days), max 24 hour daily dose is 6 tablets or 3000mg . In the future you can repeat the same everyday Tylenol course for 1-2 weeks at a time.   Future can consider X-ray  Also we can refer you to Physical Therapy if just need to improve on range of motion.  Lastly if not improved by about 4-6 weeks - or just need treatment sooner - call and return for a Steroid Injection in shoulder.   Please schedule a Follow-up Appointment to: Return in about 4 weeks (around 10/21/2020), or if symptoms worsen or fail to improve, for R shoulder bursitis.  If you have any other questions or concerns, please feel free to call the office or send a message through [place]. You may also schedule an earlier appointment if necessary.  Additionally, you may be receiving a survey about your experience at our office within a few days to 1 week by e-mail or mail. We value your feedback.  Nobie Putnam, DO [facility]  Range of Motion Shoulder Exercises  [place] with your good arm against a  counter or table for support [facility] forward with a wide stance (make sure your body is comfortable) - Your painful shoulder should hang down and feel "heavy" - Gently move your painful arm in small circles "clockwise" for several turns - Switch to "counterclockwise" for several turns - Early on keep circles narrow and move slowly - Later in rehab, move in larger circles and faster movement   Wall Crawl - Stand close (about 1-2 ft away) to a wall, facing it directly - Reach out with your arm of painful shoulder and place fingers (not palm) on wall - You should make contact with wall at your waist level - Slowly walk your fingers up the wall. Stay in contact with wall entire time, do not remove fingers - Keep walking fingers up wall until you reach shoulder level - You may feel tightening or mild discomfort, once you reach a height that causes pain or if you are already above your shoulder height then stop. Repeat from starting position. - Early on stand closer to wall, move fingers slowly, and stay at or below shoulder level - Later in rehab, stand farther away from wall (fingertips), move fingers quicker, go above shoulder level

## 2020-10-06 ENCOUNTER — Ambulatory Visit: Payer: Medicare HMO

## 2020-10-08 ENCOUNTER — Ambulatory Visit
Admission: RE | Admit: 2020-10-08 | Discharge: 2020-10-08 | Disposition: A | Discharge status: Home / Self Care | Payer: Medicare HMO | Source: Ambulatory Visit | Attending: Family Medicine | Admitting: Family Medicine

## 2020-10-08 ENCOUNTER — Other Ambulatory Visit: Payer: Self-pay

## 2020-10-08 DIAGNOSIS — Z1231 Encounter for screening mammogram for malignant neoplasm of breast: Secondary | ICD-10-CM

## 2020-11-20 ENCOUNTER — Telehealth: Payer: Self-pay | Admitting: Family Medicine

## 2020-11-20 NOTE — Telephone Encounter (Signed)
Called patient to schedule Medicare Annual Wellness Visit (AWV) to be done virtually or by telephone and patient asked if she could call back to schedule this because she was in a store and could not talk. Advised patient to call the office back and we would get her AWV scheduled.   No hx of AWV eligible as of 01/10/13  Please schedule at anytime with [facility].      40 Minutes appointment   Any questions, please call me at [phone]

## 2020-12-16 ENCOUNTER — Ambulatory Visit: Payer: Medicare HMO

## 2021-01-14 DIAGNOSIS — E559 Vitamin D deficiency, unspecified: Secondary | ICD-10-CM | POA: Diagnosis not present

## 2021-01-14 DIAGNOSIS — M810 Age-related osteoporosis without current pathological fracture: Secondary | ICD-10-CM | POA: Diagnosis not present

## 2021-01-29 ENCOUNTER — Other Ambulatory Visit: Payer: Self-pay | Admitting: Family Medicine

## 2021-01-29 DIAGNOSIS — F3342 Major depressive disorder, recurrent, in full remission: Secondary | ICD-10-CM
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
## 2021-02-11 ENCOUNTER — Telehealth: Payer: Self-pay | Admitting: Family Medicine

## 2021-02-11 NOTE — Telephone Encounter (Signed)
Left message for patient to call back and schedule Medicare Annual Wellness Visit (AWV) to be done virtually or by telephone.  No hx of AWV eligible as of 01/10/13  Please schedule at anytime with [facility].      40 Minutes appointment   Any questions, please call me at [phone]

## 2021-02-17 ENCOUNTER — Ambulatory Visit (INDEPENDENT_AMBULATORY_CARE_PROVIDER_SITE_OTHER): Payer: Medicare HMO | Admitting: Family Medicine

## 2021-02-17 ENCOUNTER — Other Ambulatory Visit: Payer: Self-pay

## 2021-02-17 ENCOUNTER — Encounter: Payer: Self-pay | Admitting: Family Medicine

## 2021-02-17 VITALS — BP 127/67 | HR 65 | Ht 64.0 in | Wt 205.2 lb

## 2021-02-17 DIAGNOSIS — F3342 Major depressive disorder, recurrent, in full remission: Secondary | ICD-10-CM

## 2021-02-17 DIAGNOSIS — F419 Anxiety disorder, unspecified: Secondary | ICD-10-CM | POA: Diagnosis not present

## 2021-02-17 DIAGNOSIS — H9201 Otalgia, right ear: Secondary | ICD-10-CM

## 2021-02-17 DIAGNOSIS — J432 Centrilobular emphysema: Secondary | ICD-10-CM | POA: Diagnosis not present

## 2021-02-17 DIAGNOSIS — Z23 Encounter for immunization: Secondary | ICD-10-CM | POA: Diagnosis not present

## 2021-02-17 DIAGNOSIS — H60541 Acute eczematoid otitis externa, right ear: Secondary | ICD-10-CM | POA: Diagnosis not present

## 2021-02-17 DIAGNOSIS — R69 Illness, unspecified: Secondary | ICD-10-CM | POA: Diagnosis not present

## 2021-02-17 DIAGNOSIS — I700 Atherosclerosis of aorta: Secondary | ICD-10-CM | POA: Diagnosis not present

## 2021-02-17 MED ORDER — HYDROCORTISONE-ACETIC ACID 1-2 % OT SOLN: 3.0000 [drp] | Freq: Three times a day (TID) | OTIC | 0 refills | Status: DC

## 2021-02-17 MED ORDER — BUSPIRONE HCL 5 MG PO TABS: 5.0000 mg | ORAL_TABLET | Freq: Two times a day (BID) | ORAL | 2 refills | Status: DC | PRN

## 2021-02-17 NOTE — Assessment & Plan Note (Signed)
Stable chronic COPD mild emphysema without exacerbation today - Former smoker. No prior PFTs or maintenance therapy.  - Reviewed course if any worsening respiratory status or more frequent exac may need formal PFTs further eval and consider maintenance therapy

## 2021-02-17 NOTE — Progress Notes (Signed)
Subjective:    Patient ID: Kelly Weber, female    DOB: 01-Nov-1945, 75 y.o.   MRN: [id number]  Kelly Weber is a 75 y.o. female presenting on 02/17/2021 for Ear Pain   HPI  Recurrent Depression in Remission Anxiety, irritability She has tried to wean off current dose Venlafaxine 37.5mg  daily she has tried spacing it out to every 4-5 days, she has plenty left over. She has issue with feeling irritability and anxious when tries to come off this med, has had to restart again. She has never tried Buspar interested in PRN base medication - Previous medications - Ambien, Trazodone, Lexapro, Zoloft  Right Ear Pain She has used Q tip with peroxide Has some dry itchy skin feels like in her ear Not having hearing loss or drainage  Episodic runny nose sinus cough Well enough for flu shot  Atherosclerosis Aorta On imaging previously No new concerns or symptoms Not on statin  Centrilobular Emphysema She has no concerns right now with breathing. Not on maintenance therapy. Former smoker  Health Maintenance:  Due for [employer], will receive today    Depression screen [facility] 2/9 02/17/2021 09/23/2020 08/27/2020  Decreased Interest 0 0 0  Down, Depressed, Hopeless 0 0 0  PHQ - 2 Score 0 0 0  Altered sleeping 0 0 0  Tired, decreased energy 1 1 0  Change in appetite - 0 0  Feeling bad or failure about yourself  0 0 0  Trouble concentrating 0 0 0  Moving slowly or fidgety/restless 0 0 0  Suicidal thoughts 0 0 0  PHQ-9 Score 1 1 0  Difficult doing work/chores Not difficult at all Not difficult at all Not difficult at all  Some recent data might be hidden   GAD 7 : Generalized Anxiety Score 02/17/2021 09/23/2020 08/27/2020 01/30/2020  Nervous, Anxious, on Edge 1 0 0 1  Control/stop worrying 0 1 0 0  Worry too much - different things 0 1 0 1  Trouble relaxing 0 0 0 2  Restless 0 0 0 0  Easily annoyed or irritable 2 1 0 0  Afraid - awful might happen 0 0 0 0  Total GAD 7 Score 3 3 0 4   Anxiety Difficulty Not difficult at all Not difficult at all Not difficult at all Not difficult at all     Social History   Tobacco Use   Smoking status: Former    Packs/day: 1.00    Years: 30.00    Pack years: 30.00    Types: Cigarettes    Quit date: 01/19/2014    Years since quitting: 7.0   Smokeless tobacco: Former  Scientific laboratory technician Use: Never used  Substance Use Topics   Alcohol use: No    Alcohol/week: 0.0 standard drinks   Drug use: No    Review of Systems Per HPI unless specifically indicated above     Objective:    BP 127/67   Pulse 65   Ht 5\' 4"  (1.626 m)   Wt 205 lb 3.2 oz (93.1 kg)   SpO2 98%   BMI 35.22 kg/m   Wt Readings from Last 3 Encounters:  02/17/21 205 lb 3.2 oz (93.1 kg)  09/23/20 212 lb 6.4 oz (96.3 kg)  08/27/20 214 lb (97.1 kg)    Physical Exam Vitals and nursing note reviewed.  Constitutional:      General: She is not in acute distress.    Appearance: Normal appearance. She is well-developed.  She is not diaphoretic.     Comments: Well-appearing, comfortable, cooperative  HENT:     Head: Normocephalic and atraumatic.     Right Ear: Tympanic membrane and ear canal normal. There is no impacted cerumen.     Ears:     Comments: R ear with some dry skin of ext ear canal with possible eczema appearance otitis externa Eyes:     General:        Right eye: No discharge.        Left eye: No discharge.     Conjunctiva/sclera: Conjunctivae normal.  Cardiovascular:     Rate and Rhythm: Normal rate.  Pulmonary:     Effort: Pulmonary effort is normal.  Skin:    General: Skin is warm and dry.     Findings: No erythema or rash.  Neurological:     Mental Status: She is alert and oriented to person, place, and time.  Psychiatric:        Mood and Affect: Mood normal.        Behavior: Behavior normal.        Thought Content: Thought content normal.     Comments: Well groomed, good eye contact, normal speech and thoughts     Results for  orders placed or performed in visit on 01/22/20  VITAMIN D 25 Hydroxy (Vit-D Deficiency, Fractures)  Result Value Ref Range   Vit D, 25-Hydroxy 30 30 - 100 ng/mL  TSH  Result Value Ref Range   TSH 2.57 0.40 - 4.50 mIU/L  Lipid panel  Result Value Ref Range   Cholesterol 185 <200 mg/dL   HDL 55 > OR = 50 mg/dL   Triglycerides 117 <150 mg/dL   LDL Cholesterol (Calc) 108 (H) mg/dL (calc)   Total CHOL/HDL Ratio 3.4 <5.0 (calc)   Non-HDL Cholesterol (Calc) 130 (H) <130 mg/dL (calc)  COMPLETE METABOLIC PANEL WITH GFR  Result Value Ref Range   Glucose, Bld 92 65 - 99 mg/dL   BUN 14 7 - 25 mg/dL   Creat 0.80 0.60 - 0.93 mg/dL   GFR, Est Non African American 73 > OR = 60 mL/min/1.43m2   GFR, Est African American 84 > OR = 60 mL/min/1.5m2   BUN/Creatinine Ratio NOT APPLICABLE 6 - 22 (calc)   Sodium 140 135 - 146 mmol/L   Potassium 4.1 3.5 - 5.3 mmol/L   Chloride 104 98 - 110 mmol/L   CO2 27 20 - 32 mmol/L   Calcium 8.6 8.6 - 10.4 mg/dL   Total Protein 6.2 6.1 - 8.1 g/dL   Albumin 3.7 3.6 - 5.1 g/dL   Globulin 2.5 1.9 - 3.7 g/dL (calc)   AG Ratio 1.5 1.0 - 2.5 (calc)   Total Bilirubin 0.5 0.2 - 1.2 mg/dL   Alkaline phosphatase (APISO) 84 37 - 153 U/L   AST 16 10 - 35 U/L   ALT 8 6 - 29 U/L  CBC with Differential/Platelet  Result Value Ref Range   WBC 4.6 3.8 - 10.8 Thousand/uL   RBC 4.28 3.80 - 5.10 Million/uL   Hemoglobin 11.6 (L) 11.7 - 15.5 g/dL   HCT 37.1 35.0 - 45.0 %   MCV 86.7 80.0 - 100.0 fL   MCH 27.1 27.0 - 33.0 pg   MCHC 31.3 (L) 32.0 - 36.0 g/dL   RDW 13.8 11.0 - 15.0 %   Platelets 275 140 - 400 Thousand/uL   MPV 10.4 7.5 - 12.5 fL   Neutro Abs 2,093 1,500 - 7,800 cells/uL  Lymphs Abs 1,978 850 - 3,900 cells/uL   Absolute Monocytes 382 200 - 950 cells/uL   Eosinophils Absolute 129 15 - 500 cells/uL   Basophils Absolute 18 0 - 200 cells/uL   Neutrophils Relative % 45.5 %   Total Lymphocyte 43.0 %   Monocytes Relative 8.3 %   Eosinophils Relative 2.8 %    Basophils Relative 0.4 %  Hemoglobin A1c  Result Value Ref Range   Hgb A1c MFr Bld 5.5 <5.7 % of total Hgb   Mean Plasma Glucose 111 (calc)   eAG (mmol/L) 6.2 (calc)      Assessment & Plan:   Problem List Items Addressed This Visit     Major depression, recurrent, full remission ([place])    In remission, controlled on SNRI Prior family stressors Seconary insomnia, chronic - improved Prior Meds: Sertraline 25mg , Escitalopram 20mg , Ambien, Trazodone 50mg  - No prior Psych / counseling, has church support and friends  Plan: 1. May taper off Venlafaxine 37.5mg  daily as planned, she was unsuccessful, can add new Buspar rx today for PRN use to help with tapering off SNRI - Offered counseling Follow-up      Relevant Medications   busPIRone (BUSPAR) 5 MG tablet   Centrilobular emphysema (HCC)    Stable chronic COPD mild emphysema without exacerbation today - Former smoker. No prior PFTs or maintenance therapy.  - Reviewed course if any worsening respiratory status or more frequent exac may need formal PFTs further eval and consider maintenance therapy      Atherosclerosis of aorta (HCC)    On prior imaging Not on statin      Other Visit Diagnoses     Right ear pain    -  Primary   Relevant Medications   acetic acid-hydrocortisone (VOSOL-HC) OTIC solution   Needs flu shot       Relevant Orders   Flu Vaccine QUAD High Dose(Fluad) (Completed)   Anxiety       Relevant Medications   busPIRone (BUSPAR) 5 MG tablet   Acute eczematoid otitis externa of right ear       Relevant Medications   acetic acid-hydrocortisone (VOSOL-HC) OTIC solution       R ear Otitis externa Clinically consistent with otitis externa with eczema/atopic dermatitis with thickening dry flaking skin extending into ear canal. - Uncomplicated without sign of secondary infection. No AOM. No significant hearing loss or change    Plan Trial on Acetic acid-hydrocortisone otc drops TID x 7-10 days Follow-up  within 1 week as needed - may request referral to ENT if not improving   Flu Shot today  Major Depression recurrent in remission Anxiety / irritability See above Use Buspar 5mg  BID PRN ONLY to help taper off SNRI  Meds ordered this encounter  Medications   busPIRone (BUSPAR) 5 MG tablet    Sig: Take 1 tablet (5 mg total) by mouth 2 (two) times daily as needed.    Dispense:  60 tablet    Refill:  2   acetic acid-hydrocortisone (VOSOL-HC) OTIC solution    Sig: Place 3 drops into the right ear 3 (three) times daily.    Dispense:  10 mL    Refill:  0      Follow up plan: Return if symptoms worsen or fail to improve.  Nobie Putnam, [facility] 02/17/2021, 2:44 PM

## 2021-02-17 NOTE — Assessment & Plan Note (Signed)
On prior imaging Not on statin

## 2021-02-17 NOTE — Assessment & Plan Note (Signed)
In remission, controlled on SNRI Prior family stressors Seconary insomnia, chronic - improved Prior Meds: Sertraline 25mg , Escitalopram 20mg , Ambien, Trazodone 50mg  - No prior Psych / counseling, has church support and friends  Plan: 1. May taper off Venlafaxine 37.5mg  daily as planned, she was unsuccessful, can add new Buspar rx today for PRN use to help with tapering off SNRI - Offered counseling Follow-up

## 2021-02-17 NOTE — Patient Instructions (Addendum)
Thank you for coming to the office today.  Taper off the Venlafaxine again, and now use the Buspar as needed for anxiety irritability as you are coming off the first medicine. May end up using Buspar whenever you need up to twice per day, if we need to adjust the dose we can. Hopefully this is a good back up plan med and not required long term.  Use ear drops for the dry itchy irritated skin in the R ear, 3 drops 3 times a day for 7 days approx  Flu shot today   Please schedule a Follow-up Appointment to: Return if symptoms worsen or fail to improve.  If you have any other questions or concerns, please feel free to call the office or send a message through [place]. You may also schedule an earlier appointment if necessary.  Additionally, you may be receiving a survey about your experience at our office within a few days to 1 week by e-mail or mail. We value your feedback.  Nobie Putnam, DO [place]

## 2021-04-12 HISTORY — PX: CATARACT EXTRACTION W/ INTRAOCULAR LENS IMPLANT: SHX1309

## 2021-06-04 ENCOUNTER — Ambulatory Visit (INDEPENDENT_AMBULATORY_CARE_PROVIDER_SITE_OTHER): Payer: Medicare HMO

## 2021-06-04 ENCOUNTER — Other Ambulatory Visit: Payer: Self-pay

## 2021-06-04 VITALS — Ht 64.0 in | Wt 199.0 lb

## 2021-06-04 DIAGNOSIS — Z0000 Encounter for general adult medical examination without abnormal findings: Secondary | ICD-10-CM

## 2021-06-04 NOTE — Progress Notes (Signed)
Virtual Visit via Telephone Note  I connected with Kelly Weber on 06/04/21 at 10:00 AM EST by telephone and verified that I am speaking with the correct person using two identifiers.  Location: Patient:  Kelly Weber Provider: Nobie Putnam, DO   I discussed the limitations, risks, security and privacy concerns of performing an evaluation and management service by telephone and the availability of in person appointments. I also discussed with the patient that there may be a patient responsible charge related to this service. The patient expressed understanding and agreed to proceed.    I discussed the assessment and treatment plan with the patient. The patient was provided an opportunity to ask questions and all were answered. The patient agreed with the plan and demonstrated an understanding of the instructions.    I provided 40 minutes of non-face-to-face time during this encounter.   Wilson Singer, CMA   HPI:  Patient presents to clinic today for their subsequent annual Medicare wellness exam.  Past Medical History:  Diagnosis Date   Allergies    Anxiety    Arthritis    fingers, left knee   Depression    Diverticulitis    Dyspnea    Fatigue    GERD (gastroesophageal reflux disease)    HLD (hyperlipidemia)    Insomnia 2006   Lower back pain    Skin cancer 2007   Basal Cell CA resected from Left middle finger    Current Outpatient Medications  Medication Sig Dispense Refill   albuterol (VENTOLIN HFA) 108 (90 Base) MCG/ACT inhaler Inhale 2 puffs into the lungs every 6 (six) hours as needed for wheezing or shortness of breath. 6.7 g 1   alendronate (FOSAMAX) 70 MG tablet Take 70 mg by mouth once a week.     azelastine (ASTELIN) 0.1 % nasal spray USE 1 2 SPRAYS IN EACH NOSTRIL TWICE DAILY     Calcium Carb-Cholecalciferol 600-800 MG-UNIT TABS Take 1 tablet by mouth daily.      ergocalciferol (VITAMIN D2) 1.25 MG (50000 UT) capsule Take 50,000 Units by mouth.      fluticasone (FLONASE) 50 MCG/ACT nasal spray PLACE 2 SPRAYS INTO BOTH NOSTRILS DAILY. USE FOR 4-6 WEEKS THEN STOP AND USE SEASONALLY OR AS NEEDED 48 g 1   valACYclovir (VALTREX) 1000 MG tablet Take 2,000 mg by mouth 2 (two) times daily as needed (fever blisters / rash).      venlafaxine XR (EFFEXOR-XR) 37.5 MG 24 hr capsule TAKE ONE CAPSULE BY MOUTH EVERY MORNING WITH BREAKFAST 90 capsule 3   acetic acid-hydrocortisone (VOSOL-HC) OTIC solution Place 3 drops into the right ear 3 (three) times daily. (Patient not taking: Reported on 06/04/2021) 10 mL 0   baclofen (LIORESAL) 10 MG tablet Take 0.5-1 tablets (5-10 mg total) by mouth 3 (three) times daily as needed for muscle spasms. (Patient not taking: Reported on 06/04/2021) 30 each 3   busPIRone (BUSPAR) 5 MG tablet Take 1 tablet (5 mg total) by mouth 2 (two) times daily as needed. (Patient not taking: Reported on 06/04/2021) 60 tablet 2   cetirizine (ZYRTEC) 10 MG chewable tablet Chew 10 mg by mouth daily. (Patient not taking: Reported on 06/04/2021)     dicyclomine (BENTYL) 10 MG capsule dicyclomine 10 mg capsule  TAKE 1 CAPSULE (10 MG TOTAL) BY MOUTH 4 (FOUR) TIMES DAILY - BEFORE MEALS AND AT BEDTIME. (Patient not taking: Reported on 06/04/2021)     Skin Protectants, Misc. (EUCERIN) cream Apply topically as needed for dry skin. (  Patient not taking: Reported on 06/04/2021)     No current facility-administered medications for this visit.    Allergies  Allergen Reactions   Penicillins Hives and Swelling    Family History  Problem Relation Age of Onset   Brain cancer Mother    Thyroid disease Mother    Hypertension Mother    Breast cancer Maternal Aunt 26   Breast cancer Cousin 30   Breast cancer Maternal Aunt     Social History   Socioeconomic History   Marital status: Married    Spouse name: Ahlaya Ende   Number of children: Not on file   Years of education: Not on file   Highest education level: Not on file  Occupational History    Occupation: Retired   Occupation: retired  Tobacco Use   Smoking status: Former    Packs/day: 1.00    Years: 30.00    Pack years: 30.00    Types: Cigarettes    Quit date: 01/19/2014    Years since quitting: 7.3   Smokeless tobacco: Former  Scientific laboratory technician Use: Never used  Substance and Sexual Activity   Alcohol use: No    Alcohol/week: 0.0 standard drinks   Drug use: No   Sexual activity: Yes  Other Topics Concern   Not on file  Social History Narrative   Not on file   Social Determinants of Health   Financial Resource Strain: Low Risk    Difficulty of Paying Living Expenses: Not hard at all  Food Insecurity: No Food Insecurity   Worried About Charity fundraiser in the Last Year: Never true   [place] in the Last Year: Never true  Transportation Needs: No Transportation Needs   Lack of Transportation (Medical): No   Lack of Transportation (Non-Medical): No  Physical Activity: Sufficiently Active   Days of Exercise per Week: 5 days   Minutes of Exercise per Session: 30 min  Stress: No Stress Concern Present   Feeling of Stress : Not at all  Social Connections: Moderately Integrated   Frequency of Communication with Friends and Family: More than three times a week   Frequency of Social Gatherings with Friends and Family: Twice a week   Attends Religious Services: 1 to 4 times per year   Active Member of [employer] or Organizations: No   Attends Archivist Meetings: Never   Marital Status: Married  Human resources officer Violence: Not At Risk   Fear of Current or Ex-Partner: No   Emotionally Abused: No   Physically Abused: No   Sexually Abused: No    Hospitiliaztions: No hospitalization in the past 12 mths.  Health Maintenance: Influenza: 02/17/21 Prevnar 13: 01/02/2014 Pneuococcal 23: 02/01/18 TDAP: 04/13/11, pt aware she can get her TDAP vaccine at her Pharmacy Shingrix: Pt declined, but aware she can get it at her pharmacy.  Providers: PCP:  Nobie Putnam, DO Endocrinology: Mee Hives, Kristine Linea    I have personally reviewed and have noted:  1. The patient's medical and social history 2. Their use of alcohol, tobacco or illicit drugs 3. Their current medications and supplements 4. The patient's functional ability including ADL's, fall risks, home safety risks and hearing or visual impairment. 5. Diet and physical activities 6. Evidence for depression or mood disorder  Subjective:   Review of Systems:   Constitutional: Denies fever, malaise, fatigue, headache or abrupt weight changes.  HEENT: Denies eye pain, eye redness, ear pain, ringing in the ears,  wax buildup, runny nose, nasal congestion, bloody nose, or sore throat. Respiratory: Denies difficulty breathing, shortness of breath, cough or sputum production.   Cardiovascular: Denies chest pain, chest tightness, palpitations or swelling in the hands or feet.  Gastrointestinal: Intermittent diarrhea. Denies abdominal pain, bloating, constipation, blood in the stool.  GU: Denies urgency, frequency, pain with urination, burning sensation, blood in urine, odor or discharge. Musculoskeletal: Denies decrease in range of motion, difficulty with gait, muscle pain or joint pain and swelling.  Skin: Denies redness, rashes, lesions or ulcercations.  Neurological: Denies dizziness, difficulty with memory, difficulty with speech or problems with balance and coordination.  Psych: Denies anxiety, depression, SI/HI.  No other specific complaints in a complete review of systems (except as listed in HPI above).  Objective:  PE:   Ht 5\' 4"  (1.626 m)    Wt 199 lb (90.3 kg) Comment: pt reported   BMI 34.16 kg/m  Wt Readings from Last 3 Encounters:  06/04/21 199 lb (90.3 kg)  02/17/21 205 lb 3.2 oz (93.1 kg)  09/23/20 212 lb 6.4 oz (96.3 kg)     BMET    Component Value Date/Time   NA 140 01/23/2020 0754   NA 141 05/29/2018 1125   NA 141 02/14/2014 0818    K 4.1 01/23/2020 0754   K 4.2 02/14/2014 0818   CL 104 01/23/2020 0754   CL 107 02/14/2014 0818   CO2 27 01/23/2020 0754   CO2 29 02/14/2014 0818   GLUCOSE 92 01/23/2020 0754   GLUCOSE 88 02/14/2014 0818   BUN 14 01/23/2020 0754   BUN 12 05/29/2018 1125   BUN 13 02/14/2014 0818   CREATININE 0.80 01/23/2020 0754   CALCIUM 8.6 01/23/2020 0754   CALCIUM 8.1 (L) 02/14/2014 0818   GFRNONAA 73 01/23/2020 0754   GFRAA 84 01/23/2020 0754    Lipid Panel     Component Value Date/Time   CHOL 185 01/23/2020 0754   CHOL 133 05/29/2018 1125   CHOL 188 02/14/2014 0818   TRIG 117 01/23/2020 0754   TRIG 101 02/14/2014 0818   HDL 55 01/23/2020 0754   HDL 60 05/29/2018 1125   HDL 55 02/14/2014 0818   CHOLHDL 3.4 01/23/2020 0754   VLDL 20 02/14/2014 0818   LDLCALC 108 (H) 01/23/2020 0754   LDLCALC 113 (H) 02/14/2014 0818    CBC    Component Value Date/Time   WBC 4.6 01/23/2020 0754   RBC 4.28 01/23/2020 0754   HGB 11.6 (L) 01/23/2020 0754   HGB 11.9 05/29/2018 1125   HCT 37.1 01/23/2020 0754   HCT 37.4 05/29/2018 1125   PLT 275 01/23/2020 0754   PLT 350 11/21/2015 0842   MCV 86.7 01/23/2020 0754   MCV 85 05/29/2018 1125   MCV 87 02/14/2014 0818   MCH 27.1 01/23/2020 0754   MCHC 31.3 (L) 01/23/2020 0754   RDW 13.8 01/23/2020 0754   RDW 14.5 05/29/2018 1125   RDW 14.4 02/14/2014 0818   LYMPHSABS 1,978 01/23/2020 0754   LYMPHSABS 2.4 05/29/2018 1125   LYMPHSABS 2.1 02/14/2014 0818   MONOABS 455 09/29/2015 1656   MONOABS 0.4 02/14/2014 0818   EOSABS 129 01/23/2020 0754   EOSABS 0.1 05/29/2018 1125   EOSABS 0.1 02/14/2014 0818   BASOSABS 18 01/23/2020 0754   BASOSABS 0.0 05/29/2018 1125   BASOSABS 0.0 02/14/2014 0818    Hgb A1C Lab Results  Component Value Date   HGBA1C 5.5 01/23/2020      Assessment and Plan:  Medicare Annual Wellness Visit:  Diet: Heart healthy  Physical activity:  Independent, Yoga x 5 days a week for about 20-30 minute Depression/mood  screen: Negative,  Flowsheet Row Clinical Support from 06/04/2021 in [facility]  PHQ-9 Total Score 1      Hearing: Intact to whispered voice Visual acuity: Grossly normal, performs annual eye exam  ADLs: Capable Fall risk: None Home safety: Good Cognitive evaluation:  6CIT Screen 06/04/2021  What Year? 0 points  What month? 0 points  What time? 0 points  Count back from 20 0 points  Months in reverse 0 points  Repeat phrase 0 points  Total Score 0   Clinical Intake: Denies any nausea, vomiting, but reports intermittent diarrhea when her diverticulitis flare up.  Pain score: 0 Activities of daily living: Independent Able to manage house, finances and medications.  Denies any fall risk pertaining to her house: House free of electrical cords, pet beds and loose throw rugs.   Assistive Devices: No life alert No use of cane, walker or wheelchair. No grab bars in her bathroom. Raised toilet seat   EOL planning: No Adv directives, full code/  Next appointment: No upcoming appt scheduled.   Wilson Singer, CMA

## 2021-08-03 DIAGNOSIS — Z08 Encounter for follow-up examination after completed treatment for malignant neoplasm: Secondary | ICD-10-CM | POA: Diagnosis not present

## 2021-08-03 DIAGNOSIS — D2272 Melanocytic nevi of left lower limb, including hip: Secondary | ICD-10-CM | POA: Diagnosis not present

## 2021-08-03 DIAGNOSIS — L820 Inflamed seborrheic keratosis: Secondary | ICD-10-CM | POA: Diagnosis not present

## 2021-08-03 DIAGNOSIS — D2271 Melanocytic nevi of right lower limb, including hip: Secondary | ICD-10-CM | POA: Diagnosis not present

## 2021-08-03 DIAGNOSIS — D2262 Melanocytic nevi of left upper limb, including shoulder: Secondary | ICD-10-CM | POA: Diagnosis not present

## 2021-08-03 DIAGNOSIS — D225 Melanocytic nevi of trunk: Secondary | ICD-10-CM | POA: Diagnosis not present

## 2021-08-03 DIAGNOSIS — L821 Other seborrheic keratosis: Secondary | ICD-10-CM | POA: Diagnosis not present

## 2021-08-03 DIAGNOSIS — L538 Other specified erythematous conditions: Secondary | ICD-10-CM | POA: Diagnosis not present

## 2021-08-03 DIAGNOSIS — D2261 Melanocytic nevi of right upper limb, including shoulder: Secondary | ICD-10-CM | POA: Diagnosis not present

## 2021-08-03 DIAGNOSIS — Z85828 Personal history of other malignant neoplasm of skin: Secondary | ICD-10-CM | POA: Diagnosis not present

## 2021-08-27 DIAGNOSIS — H2513 Age-related nuclear cataract, bilateral: Secondary | ICD-10-CM | POA: Diagnosis not present

## 2021-08-27 DIAGNOSIS — H524 Presbyopia: Secondary | ICD-10-CM | POA: Diagnosis not present

## 2021-08-27 DIAGNOSIS — H43813 Vitreous degeneration, bilateral: Secondary | ICD-10-CM | POA: Diagnosis not present

## 2021-09-08 ENCOUNTER — Other Ambulatory Visit: Payer: Self-pay | Admitting: Family Medicine

## 2021-09-08 DIAGNOSIS — Z1231 Encounter for screening mammogram for malignant neoplasm of breast: Secondary | ICD-10-CM

## 2021-09-11 DIAGNOSIS — H2511 Age-related nuclear cataract, right eye: Secondary | ICD-10-CM | POA: Diagnosis not present

## 2021-09-11 DIAGNOSIS — H2512 Age-related nuclear cataract, left eye: Secondary | ICD-10-CM | POA: Diagnosis not present

## 2021-09-11 DIAGNOSIS — Z01818 Encounter for other preprocedural examination: Secondary | ICD-10-CM | POA: Diagnosis not present

## 2021-09-17 DIAGNOSIS — H2512 Age-related nuclear cataract, left eye: Secondary | ICD-10-CM | POA: Diagnosis not present

## 2021-09-17 DIAGNOSIS — H269 Unspecified cataract: Secondary | ICD-10-CM | POA: Diagnosis not present

## 2021-10-01 DIAGNOSIS — H2511 Age-related nuclear cataract, right eye: Secondary | ICD-10-CM | POA: Diagnosis not present

## 2021-10-01 DIAGNOSIS — H269 Unspecified cataract: Secondary | ICD-10-CM | POA: Diagnosis not present

## 2021-10-12 ENCOUNTER — Ambulatory Visit
Admission: RE | Admit: 2021-10-12 | Discharge: 2021-10-12 | Disposition: A | Discharge status: Home / Self Care | Payer: Medicare HMO | Source: Ambulatory Visit | Attending: Family Medicine | Admitting: Family Medicine

## 2021-10-12 DIAGNOSIS — Z1231 Encounter for screening mammogram for malignant neoplasm of breast: Secondary | ICD-10-CM

## 2021-11-18 ENCOUNTER — Encounter (INDEPENDENT_AMBULATORY_CARE_PROVIDER_SITE_OTHER): Payer: Self-pay

## 2021-12-24 ENCOUNTER — Ambulatory Visit: Payer: Self-pay

## 2021-12-24 DIAGNOSIS — J069 Acute upper respiratory infection, unspecified: Secondary | ICD-10-CM | POA: Diagnosis not present

## 2021-12-24 DIAGNOSIS — Z20822 Contact with and (suspected) exposure to covid-19: Secondary | ICD-10-CM | POA: Diagnosis not present

## 2021-12-24 NOTE — Telephone Encounter (Signed)
  Chief Complaint: Cough with phlegm, runny nose Symptoms: ibid Frequency: 2 days Pertinent Negatives: Patient denies fever Disposition: '[]'$ ED /'[x]'$ Urgent Care (no appt availability in office) / '[]'$ Appointment(In office/virtual)/ '[]'$  [place] [facility]/ '[]'$ Home Care/ '[]'$ Refused Recommended Disposition /'[]'$ [place] [facility]/ '[]'$  Follow-up with PCP Additional Notes: PT has hx of emphysema. Pt has had cold s/s for 2 days. Pt has runny nose and cough with green phlegm. Pt states she has COPD, but per chart, hx of emphysema.  No fever. Pt has tried a few OTC medications with no relief.  Summary: runny nose/prod cough   Pt has runny nose, and coughing up green phlegm.  Pt requesting appt to see her dr.      Luiz Iron for Disposition  [1] Known COPD or other severe lung disease (i.e., bronchiectasis, cystic fibrosis, lung surgery) AND [2] worsening symptoms (i.e., increased sputum purulence or amount, increased breathing difficulty  Answer Assessment - Initial Assessment Questions 1. ONSET: "When did the cough begin?"      2 days ago 2. SEVERITY: "How bad is the cough today?"      Coughs when phlegm is there 3. SPUTUM: "Describe the color of your sputum" (none, dry cough; clear, white, yellow, green)     Green 4. HEMOPTYSIS: "Are you coughing up any blood?" If so ask: "How much?" (flecks, streaks, tablespoons, etc.)     no 5. DIFFICULTY BREATHING: "Are you having difficulty breathing?" If Yes, ask: "How bad is it?" (e.g., mild, moderate, severe)    - MILD: No SOB at rest, mild SOB with walking, speaks normally in sentences, can lie down, no retractions, pulse < 100.    - MODERATE: SOB at rest, SOB with minimal exertion and prefers to sit, cannot lie down flat, speaks in phrases, mild retractions, audible wheezing, pulse 100-120.    - SEVERE: Very SOB at rest, speaks in single words, struggling to breathe, sitting hunched forward, retractions, pulse > 120      Extra pillow 6. FEVER: "Do you  have a fever?" If Yes, ask: "What is your temperature, how was it measured, and when did it start?"     no 7. CARDIAC HISTORY: "Do you have any history of heart disease?" (e.g., heart attack, congestive heart failure)      no 8. LUNG HISTORY: "Do you have any history of lung disease?"  (e.g., pulmonary embolus, asthma, emphysema)     COPD 9. PE RISK FACTORS: "Do you have a history of blood clots?" (or: recent major surgery, recent prolonged travel, bedridden)      10. OTHER SYMPTOMS: "Do you have any other symptoms?" (e.g., runny nose, wheezing, chest pain)       Runny nose 11. PREGNANCY: "Is there any chance you are pregnant?" "When was your last menstrual period?"       no 12. TRAVEL: "Have you traveled out of the country in the last month?" (e.g., travel history, exposures)  Protocols used: Cough - Acute Productive-A-AH

## 2021-12-27 ENCOUNTER — Encounter: Payer: Self-pay | Admitting: Emergency Medicine

## 2021-12-27 ENCOUNTER — Emergency Department
Admission: EM | Admit: 2021-12-27 | Discharge: 2021-12-27 | Disposition: A | Discharge status: Home / Self Care | Payer: Medicare HMO | Attending: Student in an Organized Health Care Education/Training Program | Admitting: Student in an Organized Health Care Education/Training Program

## 2021-12-27 ENCOUNTER — Emergency Department: Payer: Medicare HMO

## 2021-12-27 ENCOUNTER — Other Ambulatory Visit: Payer: Self-pay

## 2021-12-27 DIAGNOSIS — J9801 Acute bronchospasm: Secondary | ICD-10-CM

## 2021-12-27 DIAGNOSIS — R079 Chest pain, unspecified: Secondary | ICD-10-CM | POA: Diagnosis not present

## 2021-12-27 DIAGNOSIS — Z20822 Contact with and (suspected) exposure to covid-19: Secondary | ICD-10-CM | POA: Diagnosis not present

## 2021-12-27 DIAGNOSIS — J069 Acute upper respiratory infection, unspecified: Secondary | ICD-10-CM | POA: Insufficient documentation

## 2021-12-27 DIAGNOSIS — Z28310 Unvaccinated for covid-19: Secondary | ICD-10-CM | POA: Diagnosis not present

## 2021-12-27 DIAGNOSIS — B9789 Other viral agents as the cause of diseases classified elsewhere: Secondary | ICD-10-CM | POA: Diagnosis not present

## 2021-12-27 DIAGNOSIS — J449 Chronic obstructive pulmonary disease, unspecified: Secondary | ICD-10-CM | POA: Insufficient documentation

## 2021-12-27 DIAGNOSIS — R059 Cough, unspecified: Secondary | ICD-10-CM | POA: Diagnosis not present

## 2021-12-27 LAB — SARS CORONAVIRUS 2 BY RT PCR: SARS Coronavirus 2 by RT PCR: NEGATIVE

## 2021-12-27 MED ORDER — PSEUDOEPH-BROMPHEN-DM 30-2-10 MG/5ML PO SYRP: 5.0000 mL | ORAL_SOLUTION | Freq: Four times a day (QID) | ORAL | 0 refills | Status: DC | PRN

## 2021-12-27 MED ORDER — IPRATROPIUM-ALBUTEROL 0.5-2.5 (3) MG/3ML IN SOLN
3.0000 mL | Freq: Once | RESPIRATORY_TRACT | Status: AC
  Administered 2021-12-27: 3 mL via RESPIRATORY_TRACT
  Filled 2021-12-27: qty 3

## 2021-12-27 MED ORDER — ALBUTEROL SULFATE HFA 108 (90 BASE) MCG/ACT IN AERS: 2.0000 | INHALATION_SPRAY | Freq: Four times a day (QID) | RESPIRATORY_TRACT | 1 refills | Status: DC | PRN

## 2021-12-27 NOTE — ED Notes (Signed)
See triage note  Presents with intermittent low grade temp and cough  Sx's started a few days ago  currently afebrile on arrival   states cough is productive

## 2021-12-27 NOTE — ED Provider Notes (Signed)
[facility] Provider Note    Event Date/Time   First MD Initiated Contact with Patient 12/27/21 1008     (approximate)   History   Cough   HPI  Kelly Weber is a 76 y.o. female presents to the ED by husband with complaints of cough, congestion and overall not feeling well for several days.  She reports a productive green cough and has been states that her temperature has been as high as 100 but does not last persistently.  Patient has a history of COPD but currently does not have any inhalers.  She was seen at an urgent care last week where a prescription for Tessalon was given to her which has not helped with her cough.  She reports that a COVID test at that time was negative.  Patient is not vaccinated against COVID.     Physical Exam   Triage Vital Signs: ED Triage Vitals  Enc Vitals Group     BP 12/27/21 0920 (!) 158/89     Pulse Rate 12/27/21 0920 88     Resp 12/27/21 0920 19     Temp 12/27/21 0920 98.1 F (36.7 C)     Temp Source 12/27/21 0920 Oral     SpO2 12/27/21 0920 98 %     Weight 12/27/21 0916 198 lb 6.6 oz (90 kg)     Height 12/27/21 0916 '5\' 4"'$  (1.626 m)     Head Circumference --      Peak Flow --      Pain Score 12/27/21 0916 0     Pain Loc --      Pain Edu? --      Excl. in [place]? --     Most recent vital signs: Vitals:   12/27/21 0920 12/27/21 1319  BP: (!) 158/89 (!) 150/80  Pulse: 88 80  Resp: 19 19  Temp: 98.1 F (36.7 C)   SpO2: 98% 98%     General: Awake, no distress.  Occasional cough noted during exam. CV:  Good peripheral perfusion.  Heart regular rate and rhythm. Resp:  Normal effort.  Lungs with congested cough but no wheezes rales or rhonchi noted. Abd:  No distention.  Other:     ED Results / Procedures / Treatments   Labs (all labs ordered are listed, but only abnormal results are displayed) Labs Reviewed  SARS CORONAVIRUS 2 BY RT PCR      RADIOLOGY X-ray per radiologist has signs suggestive  of COPD but no consolidation or pulmonary edema present.    PROCEDURES:  Critical Care performed:   Procedures   MEDICATIONS ORDERED IN ED: Medications  ipratropium-albuterol (DUONEB) 0.5-2.5 (3) MG/3ML nebulizer solution 3 mL (3 mLs Nebulization Given 12/27/21 1300)     IMPRESSION / MDM / ASSESSMENT AND PLAN / ED COURSE  I reviewed the triage vital signs and the nursing notes.   Differential diagnosis includes, but is not limited to, bronchitis, viral infection, COVID, COPD exacerbation.  76 year old female presents to the ED with complaint of several days not feeling well with a green productive cough which is unrelieved with Ladona Ridgel that she received at a previous visit.  Chest x-ray was reassuring and patient was made aware that she did not have pneumonia or bronchitis.  COVID test was negative.  Patient received a DuoNeb treatment while in the ED and a prescription for an albuterol inhaler was sent to the pharmacy as she has not had one in a long time.  Also Bromfed-DM as needed for cough and congestion and patient is encouraged to drink fluids to stay hydrated.  She will discontinue taking the [employer] as they have not worked for her cough.  Patient and husband plan on traveling to [facility] on Wednesday.  She was urged to take her medication with her and to follow-up at an urgent care there if any concerns.      Patient's presentation is most consistent with acute complicated illness / injury requiring diagnostic workup.  FINAL CLINICAL IMPRESSION(S) / ED DIAGNOSES   Final diagnoses:  Viral URI with cough     Rx / DC Orders   ED Discharge Orders          Ordered    albuterol (VENTOLIN HFA) 108 (90 Base) MCG/ACT inhaler  Every 6 hours PRN        12/27/21 1215    brompheniramine-pseudoephedrine-DM 30-2-10 MG/5ML syrup  4 times daily PRN        12/27/21 1215             Note:  This document was prepared using Dragon voice recognition software  and may include unintentional dictation errors.   Johnn Hai, PA-C 12/27/21 1320    Merlyn Lot, MD 12/27/21 [phone]

## 2021-12-27 NOTE — ED Triage Notes (Signed)
Pt reports had bronchitis and over the last few days feels like her symptoms have gotten worse. Pt reports cough is now productive with green sputum

## 2021-12-27 NOTE — Discharge Instructions (Signed)
Follow-up with your primary care provider if any continued problems or concerns.  Increase fluids to stay hydrated.  Discontinue taking the [employer] as they do not seem to be helping with your cough.  A prescription for Bromfed-DM was sent to the pharmacy to help with your cough and congestion.  Also an albuterol inhaler was sent to the pharmacy that you have had in the past to also help with your cough.  Tylenol or ibuprofen if needed for fever, body aches or headache.  Today your COVID test is negative

## 2021-12-28 ENCOUNTER — Ambulatory Visit: Payer: Self-pay

## 2021-12-28 ENCOUNTER — Ambulatory Visit (INDEPENDENT_AMBULATORY_CARE_PROVIDER_SITE_OTHER): Payer: Medicare HMO | Admitting: Physician Assistant

## 2021-12-28 VITALS — BP 144/72 | HR 90 | Ht 64.0 in | Wt 207.0 lb

## 2021-12-28 DIAGNOSIS — Z1211 Encounter for screening for malignant neoplasm of colon: Secondary | ICD-10-CM | POA: Diagnosis not present

## 2021-12-28 DIAGNOSIS — B309 Viral conjunctivitis, unspecified: Secondary | ICD-10-CM

## 2021-12-28 NOTE — Patient Instructions (Addendum)
  Based on your symptoms I believe that you have viral conjunctivitis   You can use sterile eye flushes and lubricating eye drops to assist with eye irritation and further resolution I recommend Blink tears or Rephresh eye drops   You can also use a warm compress over the eye to assist with swelling and matting - especially in the morning   If you have used makeup or mascara on that eye I recommend discarding it as this can cause recurrent infection.  Thoroughly wash any makeup brushes and avoid using makeup while recovering from the infection.   If you notice the following please return to the office: lack of improvement, eyelid swelling, increased eye irritation If you notice the following please go to the ED: eye pressure causing displacement of the eye, vision changes, increased eye pain or foreign body sensation, fever

## 2021-12-28 NOTE — Telephone Encounter (Signed)
  Chief Complaint: Green discharge from eyes Symptoms: Above Frequency: today Pertinent Negatives: Patient denies  Disposition: '[]'$ ED /'[]'$ Urgent Care (no appt availability in office) / '[x]'$ Appointment(In office/virtual)/ '[]'$  [place] [facility]/ '[]'$ Home Care/ '[]'$ Refused Recommended Disposition /'[]'$ [place] [facility]/ '[]'$  Follow-up with PCP Additional Notes: Pt was seen at ED for URI yesterday. Today pt has green eye discharge.   Summary: Eye infection   Patient was seen in ED yesterday for a uri and patient states it is now in her eyes. She has green discharge from her eyes and they are very itchy. Please advise patient.        Reason for Disposition  [1] Eye with yellow or green discharge, or eyelashes stick together AND [2] NO PCP standing order to call in antibiotic eye drops   (Exception: [place]; continue triage.)  Answer Assessment - Initial Assessment Questions 1. EYE DISCHARGE: "Is the discharge in one or both eyes?" "What color is it?" "How much is there?" "When did the discharge start?"      Both - green 2. REDNESS OF SCLERA: "Is the redness in one or both eyes?" "When did the redness start?"       3. EYELIDS: "Are the eyelids red or swollen?" If Yes, ask: "How much?"       4. VISION: "Is there any difficulty seeing clearly?"       5. PAIN: "Is there any pain? If Yes, ask: "How bad is it?" (Scale 1-10; or mild, moderate, severe)    - MILD (1-3): doesn't interfere with normal activities     - MODERATE (4-7): interferes with normal activities or awakens from sleep    - SEVERE (8-10): excruciating pain, unable to do any normal activities        6. CONTACT LENS: "Do you wear contacts?"      7. OTHER SYMPTOMS: "Do you have any other symptoms?" (e.g., fever, runny nose, cough)     URI 8. PREGNANCY: "Is there any chance you are pregnant?" "When was your last menstrual period?"     na  Protocols used: Eye - Pus or [place]

## 2021-12-28 NOTE — Progress Notes (Signed)
Established Patient Office Visit  Name: Kelly Weber   MRN: [id number]    DOB: 12/25/1945   Date:12/28/2021  Today's Provider: Talitha Givens, MHS, PA-C Introduced myself to the patient as a PA-C and provided education on APPs in clinical practice.         Subjective  Chief Complaint  Chief Complaint  Patient presents with   Cough   URI    HPI  Patient is currently being treated for likely viral URI and reports green discharge from her eyes She has had cataract surgery about 3 months ago Reports she has not been feeling well for almost a week Eye irritation  Location: bilateral  Onset: sudden Duration: yesterday  She does not wear contacts         Patient Active Problem List   Diagnosis Date Noted   Chronic bilateral low back pain without sciatica 06/13/2020   Osteoporosis of lumbar spine 01/30/2020   Atherosclerosis of aorta ([place]) 10/24/2019   Vitamin D deficiency 11/23/2018   Elevated hemoglobin A1c 01/29/2018   Hyperlipidemia 09/02/2016   GERD (gastroesophageal reflux disease) 09/01/2016   Chronic diarrhea 02/09/2016   Anemia 09/29/2015   Centrilobular emphysema ([place]) 08/27/2015   Syncope and collapse 08/26/2015   SOB (shortness of breath) on exertion 08/26/2015   Pityriasis rosea 03/12/2015   Seasonal allergies 02/27/2015   Hx of colonic polyps    Benign neoplasm of descending colon    Irritable bowel syndrome (IBS)    Hiatal hernia    Gonalgia 09/23/2014   Arthritis 09/23/2014   Major depression, recurrent, full remission ([place]) 09/23/2014   Dysesthesia 09/23/2014   Insomnia due to medical condition 09/23/2014   Acne 09/23/2014   H/O malignant neoplasm of skin 09/23/2014   Compulsive tobacco user syndrome 09/23/2014   Hematuria, microscopic 09/23/2014   Seborrheic keratoses, inflamed 09/23/2014    Past Surgical History:  Procedure Laterality Date   BREAST CYST ASPIRATION Bilateral    30 years ago   COLONOSCOPY WITH PROPOFOL N/A  11/20/2014   Procedure: COLONOSCOPY WITH PROPOFOL;  Surgeon: Lucilla Lame, MD;  Location: [place];  Service: Endoscopy;  Laterality: N/A;   ESOPHAGOGASTRODUODENOSCOPY (EGD) WITH PROPOFOL N/A 11/20/2014   Procedure: ESOPHAGOGASTRODUODENOSCOPY (EGD) WITH PROPOFOL;  Surgeon: Lucilla Lame, MD;  Location: [place];  Service: Endoscopy;  Laterality: N/A;   POLYPECTOMY  11/20/2014   Procedure: POLYPECTOMY;  Surgeon: Lucilla Lame, MD;  Location: [place];  Service: Endoscopy;;   SKIN CANCER EXCISION Left    finger   TUBAL LIGATION  1970    Family History  Problem Relation Age of Onset   Brain cancer Mother    Thyroid disease Mother    Hypertension Mother    Breast cancer Maternal Aunt 26   Breast cancer Cousin 35   Breast cancer Maternal Aunt     Social History   Tobacco Use   Smoking status: Former    Packs/day: 1.00    Years: 30.00    Total pack years: 30.00    Types: Cigarettes    Quit date: 01/19/2014    Years since quitting: 7.9   Smokeless tobacco: Former  Substance Use Topics   Alcohol use: No    Alcohol/week: 0.0 standard drinks of alcohol     Current Outpatient Medications:    acetic acid-hydrocortisone (VOSOL-HC) OTIC solution, Place 3 drops into the right ear 3 (three) times daily. (Patient not taking: Reported on 06/04/2021), Disp: 10 mL,  Rfl: 0   albuterol (VENTOLIN HFA) 108 (90 Base) MCG/ACT inhaler, Inhale 2 puffs into the lungs every 6 (six) hours as needed for wheezing or shortness of breath., Disp: 6.7 g, Rfl: 1   azelastine (ASTELIN) 0.1 % nasal spray, USE 1 2 SPRAYS IN EACH NOSTRIL TWICE DAILY, Disp: , Rfl:    baclofen (LIORESAL) 10 MG tablet, Take 0.5-1 tablets (5-10 mg total) by mouth 3 (three) times daily as needed for muscle spasms. (Patient not taking: Reported on 06/04/2021), Disp: 30 each, Rfl: 3   brompheniramine-pseudoephedrine-DM 30-2-10 MG/5ML syrup, Take 5 mLs by mouth 4 (four) times daily as needed., Disp: 120 mL, Rfl:  0   busPIRone (BUSPAR) 5 MG tablet, Take 1 tablet (5 mg total) by mouth 2 (two) times daily as needed. (Patient not taking: Reported on 06/04/2021), Disp: 60 tablet, Rfl: 2   Calcium Carb-Cholecalciferol 600-800 MG-UNIT TABS, Take 1 tablet by mouth daily. , Disp: , Rfl:    cetirizine (ZYRTEC) 10 MG chewable tablet, Chew 10 mg by mouth daily. (Patient not taking: Reported on 06/04/2021), Disp: , Rfl:    dicyclomine (BENTYL) 10 MG capsule, dicyclomine 10 mg capsule  TAKE 1 CAPSULE (10 MG TOTAL) BY MOUTH 4 (FOUR) TIMES DAILY - BEFORE MEALS AND AT BEDTIME. (Patient not taking: Reported on 06/04/2021), Disp: , Rfl:    ergocalciferol (VITAMIN D2) 1.25 MG (50000 UT) capsule, Take 50,000 Units by mouth., Disp: , Rfl:    fluticasone (FLONASE) 50 MCG/ACT nasal spray, PLACE 2 SPRAYS INTO BOTH NOSTRILS DAILY. USE FOR 4-6 WEEKS THEN STOP AND USE SEASONALLY OR AS NEEDED, Disp: 48 g, Rfl: 1   Skin Protectants, Misc. (EUCERIN) cream, Apply topically as needed for dry skin. (Patient not taking: Reported on 06/04/2021), Disp: , Rfl:    valACYclovir (VALTREX) 1000 MG tablet, Take 2,000 mg by mouth 2 (two) times daily as needed (fever blisters / rash). , Disp: , Rfl:    venlafaxine XR (EFFEXOR-XR) 37.5 MG 24 hr capsule, TAKE ONE CAPSULE BY MOUTH EVERY MORNING WITH BREAKFAST, Disp: 90 capsule, Rfl: 3  Allergies  Allergen Reactions   Penicillins Hives and Swelling    I personally reviewed active problem list, medication list, allergies, notes from last encounter, lab results with the patient/caregiver today.   Review of Systems  HENT:  Positive for congestion and sore throat.   Eyes:  Positive for discharge. Negative for blurred vision, double vision and redness.  Respiratory:  Positive for cough.       Objective  Vitals:   12/28/21 1544  BP: (!) 144/72  Pulse: 90  SpO2: 94%  Weight: 207 lb (93.9 kg)  Height: '5\' 4"'$  (1.626 m)    Body mass index is 35.53 kg/m.  Physical Exam Vitals reviewed.   Constitutional:      General: She is awake.     Appearance: Normal appearance. She is well-developed, well-groomed and overweight.  HENT:     Head: Normocephalic and atraumatic.     Mouth/Throat:     Mouth: Mucous membranes are moist.  Eyes:     General: Lids are normal. Gaze aligned appropriately. No allergic shiner or scleral icterus.       Right eye: Discharge present.        Left eye: Discharge present.    Extraocular Movements: Extraocular movements intact.     Right eye: Normal extraocular motion and no nystagmus.     Left eye: Normal extraocular motion and no nystagmus.     Conjunctiva/sclera: Conjunctivae normal.  Pupils: Pupils are equal, round, and reactive to light.     Comments: Scant watery discharge noted bilaterally   Cardiovascular:     Rate and Rhythm: Normal rate and regular rhythm.     Heart sounds: No murmur heard.    No friction rub. No gallop.  Pulmonary:     Effort: Pulmonary effort is normal.     Breath sounds: No stridor. No wheezing, rhonchi or rales.  Musculoskeletal:     Cervical back: Normal range of motion.  Neurological:     Mental Status: She is alert.  Psychiatric:        Attention and Perception: Attention normal.        Mood and Affect: Mood normal.        Speech: Speech normal.        Behavior: Behavior normal. Behavior is cooperative.      Recent Results (from the past 2160 hour(s))  SARS Coronavirus 2 by RT PCR (hospital order, performed in [name] hospital lab) *cepheid single result test* Anterior Nasal Swab     Status: None   Collection Time: 12/27/21 10:45 AM   Specimen: Anterior Nasal Swab  Result Value Ref Range   SARS Coronavirus 2 by RT PCR NEGATIVE NEGATIVE    Comment: (NOTE) SARS-CoV-2 target nucleic acids are NOT DETECTED.  The SARS-CoV-2 RNA is generally detectable in upper and lower respiratory specimens during the acute phase of infection. The lowest concentration of SARS-CoV-2 viral copies this assay can  detect is 250 copies / mL. A negative result does not preclude SARS-CoV-2 infection and should not be used as the sole basis for treatment or other patient management decisions.  A negative result may occur with improper specimen collection / handling, submission of specimen other than nasopharyngeal swab, presence of viral mutation(s) within the areas targeted by this assay, and inadequate number of viral copies (<250 copies / mL). A negative result must be combined with clinical observations, patient history, and epidemiological information.  Fact Sheet for Patients:   https://www.patel.info/  Fact Sheet for Healthcare Providers: https://hall.com/  This test is not yet approved or  cleared by the [place] FDA and has been authorized for detection and/or diagnosis of SARS-CoV-2 by FDA under an Emergency Use Authorization (EUA).  This EUA will remain in effect (meaning this test can be used) for the duration of the COVID-19 declaration under Section 564(b)(1) of the Act, 21 U.S.C. section 360bbb-3(b)(1), unless the authorization is terminated or revoked sooner.  Performed at [facility], [address] [place], [place] [address]      PHQ2/9:    06/04/2021   10:17 AM 06/04/2021    9:58 AM 02/17/2021    2:58 PM 09/23/2020    1:47 PM 08/27/2020    2:56 PM  Depression screen PHQ 2/9  Decreased Interest 0 0 0 0 0  Down, Depressed, Hopeless 0 0 0 0 0  PHQ - 2 Score 0 0 0 0 0  Altered sleeping 1 1 0 0 0  Tired, decreased energy 0 0 1 1 0  Change in appetite 0 0  0 0  Feeling bad or failure about yourself  0 0 0 0 0  Trouble concentrating 0 0 0 0 0  Moving slowly or fidgety/restless 0 0 0 0 0  Suicidal thoughts 0 0 0 0 0  PHQ-9 Score '1 1 1 1 '$ 0  Difficult doing work/chores Not difficult at all Not difficult at all Not difficult at all Not  difficult at all Not difficult at all      Fall Risk:    06/04/2021   10:00 AM  09/23/2020    1:47 PM 08/27/2020    2:56 PM 01/30/2020    1:56 PM 06/19/2018    2:23 PM  [place] in the past year? 0 0 0 0 0  Number falls in past yr: 0 0 0 0   Injury with Fall? 0 0 0 0   Risk for fall due to : No Fall Risks      Follow up Falls evaluation completed Falls evaluation completed Falls evaluation completed Falls evaluation completed Falls evaluation completed      Functional Status Survey:      Assessment & Plan  Problem List Items Addressed This Visit   None Visit Diagnoses     Viral conjunctivitis of both eyes    -  Primary Acute, new concern Patient reports watery discharge from both eyes along with mild gritty sensation Reviewed causes of conjunctivitis - viral, allergic and bacterial along with distinguishing features of each PE is most closely aligned with viral conjunctivitis at this time Recommend OTC interventions with sterile flushes, gentle facial cleansing and lid washes, lubricating eye drops as needed Follow up as needed for persistent or progressing symptoms     Screening for colon cancer       Relevant Orders   Ambulatory referral to Gastroenterology        No follow-ups on file.   I, Daleah Coulson E Jasper Ruminski, PA-C, have reviewed all documentation for this visit. The documentation on 12/28/21 for the exam, diagnosis, procedures, and orders are all accurate and complete.   Talitha Givens, MHS, PA-C [facility]

## 2022-01-06 ENCOUNTER — Ambulatory Visit: Payer: Self-pay | Admitting: *Deleted

## 2022-01-06 ENCOUNTER — Telehealth: Payer: Self-pay

## 2022-01-06 NOTE — Telephone Encounter (Signed)
Copied from [place] [id number]. Topic: Appointment Scheduling - Scheduling Inquiry for Clinic >> Jan 06, 2022  9:09 AM Leitha Schuller wrote: Pt needing hfu  Pt was seen at [facility] on 9-17 and states she is not feeling better  - She was seen for a viral URI.. Do you want me to make her a virtual appt? We will have to double book or she can have an appt with Erin on Friday.   Please advise

## 2022-01-06 NOTE — Telephone Encounter (Signed)
Any option would be fine. I see she is scheduled w/ [facility] for 9/29 that is fine too  Nobie Putnam, [facility] 01/06/2022, 1:56 PM

## 2022-01-06 NOTE — Telephone Encounter (Signed)
   Chief Complaint: cough, congestion Symptoms: runny nose, productive cough with white clear phlegm Frequency: 3 weeks, seen in office and ED already over the past 3 weeks Pertinent Negatives: Patient denies fever Disposition: '[]'$ ED /'[]'$ Urgent Care (no appt availability in office) / '[x]'$ Appointment(In office/virtual)/ '[]'$  [place] [facility]/ '[]'$ Home Care/ '[]'$ Refused Recommended Disposition /'[]'$ [place] [facility]/ '[]'$  Follow-up with PCP Additional Notes: OV Friday, home care discussed.  Reason for Disposition  Cough has been present for > 3 weeks  Answer Assessment - Initial Assessment Questions 1. ONSET: "When did the cough begin?"      3 weeks 2. SEVERITY: "How bad is the cough today?"      White clear phlegm 3. SPUTUM: "Describe the color of your sputum" (none, dry cough; clear, white, yellow, green)     Yes, states was green but now white, both from nose and cough 4. HEMOPTYSIS: "Are you coughing up any blood?" If so ask: "How much?" (flecks, streaks, tablespoons, etc.)     no 5. DIFFICULTY BREATHING: "Are you having difficulty breathing?" If Yes, ask: "How bad is it?" (e.g., mild, moderate, severe)    - MILD: No SOB at rest, mild SOB with walking, speaks normally in sentences, can lie down, no retractions, pulse < 100.    - MODERATE: SOB at rest, SOB with minimal exertion and prefers to sit, cannot lie down flat, speaks in phrases, mild retractions, audible wheezing, pulse 100-120.    - SEVERE: Very SOB at rest, speaks in single words, struggling to breathe, sitting hunched forward, retractions, pulse > 120      no 6. FEVER: "Do you have a fever?" If Yes, ask: "What is your temperature, how was it measured, and when did it start?"     Had earlier but not now 7. CARDIAC HISTORY: "Do you have any history of heart disease?" (e.g., heart attack, congestive heart failure)      no 8. LUNG HISTORY: "Do you have any history of lung disease?"  (e.g., pulmonary embolus, asthma, emphysema)      no 9. PE RISK FACTORS: "Do you have a history of blood clots?" (or: recent major surgery, recent prolonged travel, bedridden)     no 10. OTHER SYMPTOMS: "Do you have any other symptoms?" (e.g., runny nose, wheezing, chest pain)       Runny nose 11. PREGNANCY: "Is there any chance you are pregnant?" "When was your last menstrual period?"       no 12. TRAVEL: "Have you traveled out of the country in the last month?" (e.g., travel history, exposures)       no  Protocols used: Cough - Acute Productive-A-AH

## 2022-01-08 ENCOUNTER — Ambulatory Visit: Payer: Medicare HMO | Admitting: Physician Assistant

## 2022-01-08 ENCOUNTER — Encounter: Payer: Self-pay | Admitting: Physician Assistant

## 2022-01-08 VITALS — Wt 207.0 lb

## 2022-01-14 DIAGNOSIS — J310 Chronic rhinitis: Secondary | ICD-10-CM | POA: Diagnosis not present

## 2022-01-20 NOTE — Progress Notes (Signed)
Apt cancelled

## 2022-03-17 ENCOUNTER — Other Ambulatory Visit: Payer: Self-pay | Admitting: Family Medicine

## 2022-03-17 DIAGNOSIS — F3342 Major depressive disorder, recurrent, in full remission: Secondary | ICD-10-CM

## 2022-03-17 NOTE — Telephone Encounter (Signed)
Requested Prescriptions  Pending Prescriptions Disp Refills   venlafaxine XR (EFFEXOR-XR) 37.5 MG 24 hr capsule [Pharmacy Med Name: VENLAFAXINE HCL ER 37.5 MG CAP] 90 capsule 0    Sig: TAKE ONE CAPSULE BY MOUTH EVERY MORNING WITH BREAKFAST     Psychiatry: Antidepressants - SNRI - desvenlafaxine & venlafaxine Failed - 03/17/2022  6:22 AM      Failed - Cr in normal range and within 360 days    Creat  Date Value Ref Range Status  01/23/2020 0.80 0.60 - 0.93 mg/dL Final    Comment:    For patients >47 years of age, the reference limit for Creatinine is approximately 13% higher for people identified as African-American. .          Failed - Last BP in normal range    BP Readings from Last 1 Encounters:  12/28/21 (!) 144/72         Failed - Lipid Panel in normal range within the last 12 months    Cholesterol, Total  Date Value Ref Range Status  05/29/2018 133 100 - 199 mg/dL Final   Cholesterol  Date Value Ref Range Status  01/23/2020 185 <200 mg/dL Final  02/14/2014 188 0 - 200 mg/dL Final   Ldl Cholesterol, Calc  Date Value Ref Range Status  02/14/2014 113 (H) 0 - 100 mg/dL Final   LDL Cholesterol (Calc)  Date Value Ref Range Status  01/23/2020 108 (H) mg/dL (calc) Final    Comment:    Reference range: <100 . Desirable range <100 mg/dL for primary prevention;   <70 mg/dL for patients with CHD or diabetic patients  with > or = 2 CHD risk factors. [name] Kitchen LDL-C is now calculated using the Martin-Hopkins  calculation, which is a validated novel method providing  better accuracy than the Friedewald equation in the  estimation of LDL-C.  Cresenciano Genre et al. Annamaria Helling. 7867;672(09): 2061-2068  (http://education.[facility].com/faq/FAQ164)    HDL Cholesterol  Date Value Ref Range Status  02/14/2014 55 40 - 60 mg/dL Final   HDL  Date Value Ref Range Status  01/23/2020 55 > OR = 50 mg/dL Final  05/29/2018 60 >39 mg/dL Final   Triglycerides  Date Value Ref Range Status   01/23/2020 117 <150 mg/dL Final  02/14/2014 101 0 - 200 mg/dL Final         Passed - Completed PHQ-2 or PHQ-9 in the last 360 days      Passed - Valid encounter within last 6 months    Recent Outpatient Visits           2 months ago Appointment canceled by hospital   [facility], Dani Gobble, PA-C   2 months ago Viral conjunctivitis of both eyes   [facility] Mecum, Dani Gobble, [place]   1 year ago Right ear pain   [place], DO   1 year ago Chronic bilateral low back pain without sciatica   [place], DO   1 year ago Acute bronchitis with COPD [facility])   [facility], Devonne Doughty, DO

## 2022-04-23 ENCOUNTER — Other Ambulatory Visit: Payer: Self-pay | Admitting: *Deleted

## 2022-04-23 ENCOUNTER — Telehealth: Payer: Self-pay | Admitting: *Deleted

## 2022-04-23 DIAGNOSIS — Z86010 Personal history of colonic polyps: Secondary | ICD-10-CM

## 2022-04-23 MED ORDER — CLENPIQ 10-3.5-12 MG-GM -GM/175ML PO SOLN: 1.0000 | Freq: Once | ORAL | 0 refills | Status: AC

## 2022-04-23 NOTE — Telephone Encounter (Signed)
Gastroenterology Pre-Procedure Review  Request Date: 06/07/2022 Requesting Physician: Dr. Allen Norris  PATIENT REVIEW QUESTIONS: The patient responded to the following health history questions as indicated:    1. Are you having any GI issues? no 2. Do you have a personal history of Polyps? yes (11/20/2014) 3. Do you have a family history of Colon Cancer or Polyps? Yes 4. Diabetes Mellitus? no 5. Joint replacements in the past 12 months?no 6. Major health problems in the past 3 months?no 7. Any artificial heart valves, MVP, or defibrillator?no    MEDICATIONS & ALLERGIES:    Patient reports the following regarding taking any anticoagulation/antiplatelet therapy:   Plavix, Coumadin, Eliquis, Xarelto, Lovenox, Pradaxa, Brilinta, or Effient? no Aspirin? no  Patient confirms/reports the following medications:  Current Outpatient Medications  Medication Sig Dispense Refill   acetic acid-hydrocortisone (VOSOL-HC) OTIC solution Place 3 drops into the right ear 3 (three) times daily. (Patient not taking: Reported on 06/04/2021) 10 mL 0   albuterol (VENTOLIN HFA) 108 (90 Base) MCG/ACT inhaler Inhale 2 puffs into the lungs every 6 (six) hours as needed for wheezing or shortness of breath. 6.7 g 1   azelastine (ASTELIN) 0.1 % nasal spray USE 1 2 SPRAYS IN EACH NOSTRIL TWICE DAILY     baclofen (LIORESAL) 10 MG tablet Take 0.5-1 tablets (5-10 mg total) by mouth 3 (three) times daily as needed for muscle spasms. (Patient not taking: Reported on 06/04/2021) 30 each 3   brompheniramine-pseudoephedrine-DM 30-2-10 MG/5ML syrup Take 5 mLs by mouth 4 (four) times daily as needed. 120 mL 0   busPIRone (BUSPAR) 5 MG tablet Take 1 tablet (5 mg total) by mouth 2 (two) times daily as needed. (Patient not taking: Reported on 06/04/2021) 60 tablet 2   Calcium Carb-Cholecalciferol 600-800 MG-UNIT TABS Take 1 tablet by mouth daily.      cetirizine (ZYRTEC) 10 MG chewable tablet Chew 10 mg by mouth daily. (Patient not taking:  Reported on 06/04/2021)     dicyclomine (BENTYL) 10 MG capsule dicyclomine 10 mg capsule  TAKE 1 CAPSULE (10 MG TOTAL) BY MOUTH 4 (FOUR) TIMES DAILY - BEFORE MEALS AND AT BEDTIME. (Patient not taking: Reported on 06/04/2021)     ergocalciferol (VITAMIN D2) 1.25 MG (50000 UT) capsule Take 50,000 Units by mouth.     fluticasone (FLONASE) 50 MCG/ACT nasal spray PLACE 2 SPRAYS INTO BOTH NOSTRILS DAILY. USE FOR 4-6 WEEKS THEN STOP AND USE SEASONALLY OR AS NEEDED 48 g 1   Skin Protectants, Misc. (EUCERIN) cream Apply topically as needed for dry skin. (Patient not taking: Reported on 06/04/2021)     valACYclovir (VALTREX) 1000 MG tablet Take 2,000 mg by mouth 2 (two) times daily as needed (fever blisters / rash).      venlafaxine XR (EFFEXOR-XR) 37.5 MG 24 hr capsule TAKE ONE CAPSULE BY MOUTH EVERY MORNING WITH BREAKFAST 90 capsule 0   No current facility-administered medications for this visit.    Patient confirms/reports the following allergies:  Allergies  Allergen Reactions   Penicillins Hives and Swelling    No orders of the defined types were placed in this encounter.   AUTHORIZATION INFORMATION Primary Insurance: 1D#: Group #:  Secondary Insurance: 1D#: Group #:  SCHEDULE INFORMATION: Date:06/07/2022  Time: Location: [facility]

## 2022-04-27 ENCOUNTER — Encounter: Payer: Self-pay | Admitting: Family Medicine

## 2022-04-27 ENCOUNTER — Ambulatory Visit (INDEPENDENT_AMBULATORY_CARE_PROVIDER_SITE_OTHER): Payer: Medicare HMO | Admitting: Family Medicine

## 2022-04-27 VITALS — BP 128/74 | HR 70 | Ht 64.0 in | Wt 215.4 lb

## 2022-04-27 DIAGNOSIS — J3089 Other allergic rhinitis: Secondary | ICD-10-CM

## 2022-04-27 DIAGNOSIS — L219 Seborrheic dermatitis, unspecified: Secondary | ICD-10-CM | POA: Diagnosis not present

## 2022-04-27 DIAGNOSIS — Z23 Encounter for immunization: Secondary | ICD-10-CM

## 2022-04-27 MED ORDER — CICLOPIROX 1 % EX SHAM: 10.0000 mL | MEDICATED_SHAMPOO | CUTANEOUS | 1 refills | Status: DC

## 2022-04-27 MED ORDER — AZELASTINE HCL 0.1 % NA SOLN: 2.0000 | Freq: Two times a day (BID) | NASAL | 3 refills | Status: AC

## 2022-04-27 NOTE — Progress Notes (Signed)
Subjective:    Patient ID: Kelly Weber, female    DOB: March 01, 1946, 77 y.o.   MRN: [id number]  Kelly Weber is a 77 y.o. female presenting on 04/27/2022 for lump on scalp   HPI  Seborrheic Dermatitis Reports multiple spots on scalp that are slightly raised thickened, itching, and some flaking. She reports no scalp pain or other lesion. No infection or drainage of pus or fever. She has not tried any medicated shampoo or other treatment.  Seasonal Allergies Rhinosinusitis Refill Azelastine  Health Maintenance: Flu Shot today     06/04/2021   10:17 AM 06/04/2021    9:58 AM 02/17/2021    2:58 PM  Depression screen PHQ 2/9  Decreased Interest 0 0 0  Down, Depressed, Hopeless 0 0 0  PHQ - 2 Score 0 0 0  Altered sleeping 1 1 0  Tired, decreased energy 0 0 1  Change in appetite 0 0   Feeling bad or failure about yourself  0 0 0  Trouble concentrating 0 0 0  Moving slowly or fidgety/restless 0 0 0  Suicidal thoughts 0 0 0  PHQ-9 Score '1 1 1  '$ Difficult doing work/chores Not difficult at all Not difficult at all Not difficult at all    Social History   Tobacco Use   Smoking status: Former    Packs/day: 1.00    Years: 30.00    Total pack years: 30.00    Types: Cigarettes    Quit date: 01/19/2014    Years since quitting: 8.2   Smokeless tobacco: Former  Scientific laboratory technician Use: Never used  Substance Use Topics   Alcohol use: No    Alcohol/week: 0.0 standard drinks of alcohol   Drug use: No    Review of Systems Per HPI unless specifically indicated above     Objective:    BP 128/74   Pulse 70   Ht '5\' 4"'$  (1.626 m)   Wt 215 lb 6.4 oz (97.7 kg)   SpO2 97%   BMI 36.97 kg/m   Wt Readings from Last 3 Encounters:  04/27/22 215 lb 6.4 oz (97.7 kg)  01/08/22 207 lb (93.9 kg)  12/28/21 207 lb (93.9 kg)    Physical Exam Vitals and nursing note reviewed.  Constitutional:      General: She is not in acute distress.    Appearance: Normal appearance. She is  well-developed. She is not diaphoretic.     Comments: Well-appearing, comfortable, cooperative  HENT:     Head: Normocephalic and atraumatic.  Eyes:     General:        Right eye: No discharge.        Left eye: No discharge.     Conjunctiva/sclera: Conjunctivae normal.  Cardiovascular:     Rate and Rhythm: Normal rate.  Pulmonary:     Effort: Pulmonary effort is normal.  Skin:    General: Skin is warm and dry.     Findings: Lesion (multiple spots in scalp raised flaky irritated dry thickened) present. No erythema or rash.  Neurological:     Mental Status: She is alert and oriented to person, place, and time.  Psychiatric:        Mood and Affect: Mood normal.        Behavior: Behavior normal.        Thought Content: Thought content normal.     Comments: Well groomed, good eye contact, normal speech and thoughts    Results for orders  placed or performed during the hospital encounter of 12/27/21  SARS Coronavirus 2 by RT PCR (hospital order, performed in [facility] hospital lab) *cepheid single result test* Anterior Nasal Swab   Specimen: Anterior Nasal Swab  Result Value Ref Range   SARS Coronavirus 2 by RT PCR NEGATIVE NEGATIVE      Assessment & Plan:   Problem List Items Addressed This Visit   None Visit Diagnoses     Seborrheic dermatitis of scalp    -  Primary   Relevant Medications   Ciclopirox 1 % shampoo (Start on 04/29/2022)   Seasonal allergic rhinitis due to other allergic trigger       Relevant Medications   azelastine (ASTELIN) 0.1 % nasal spray   Needs flu shot       Relevant Orders   Flu Vaccine QUAD High Dose(Fluad) (Completed)       Clinically consistent with seborrheic dermatitis of scalp Rx Ciclopirox 1% shampoo topical steroid course as prescribed 2 x week x 4 week if resolved Consider anti fungal course after if indicated If this is unsuccessful  Seasonal Allergic Rhinitis Refill Azelastine nasal spray for chronic use.  Flu Shot  today   Orders Placed This Encounter  Procedures   Flu Vaccine QUAD High Dose(Fluad)     Meds ordered this encounter  Medications   Ciclopirox 1 % shampoo    Sig: Apply 10 mLs topically 2 (two) times a week. To scalp with wet hair. For total 4 weeks; allow a minimum of 3 days between applications    Dispense:  120 mL    Refill:  1   azelastine (ASTELIN) 0.1 % nasal spray    Sig: Place 2 sprays into both nostrils 2 (two) times daily. Use in each nostril as directed    Dispense:  90 mL    Refill:  3     Follow up plan: Return if symptoms worsen or fail to improve.   Kelly Weber, [facility] 04/27/2022, 2:25 PM

## 2022-04-27 NOTE — Patient Instructions (Addendum)
Thank you for coming to the office today.  Refilled Azelastine  Flu Shot today  Shampoo 1%: Apply ~5 mL to wet hair; lather, and leave on hair and scalp for ~3 minutes; rinse. May use up to 10 mL for longer hair. Repeat twice weekly for 4 weeks; allow a minimum of 3 days between applications; if no improvement after 4 weeks of treatment, re-evaluate diagnosis.    Ciclopirox 1 % shampoo         Apply 10 mLs topically 2 (two) times a week. To scalp with wet hair. For total 4 weeks; allow a minimum of 3 days between applications, Starting Thu 04/29/2022, Normal    Please schedule a Follow-up Appointment to: Return if symptoms worsen or fail to improve.  If you have any other questions or concerns, please feel free to call the office or send a message through [place]. You may also schedule an earlier appointment if necessary.  Additionally, you may be receiving a survey about your experience at our office within a few days to 1 week by e-mail or mail. We value your feedback.  Nobie Putnam, DO [place]

## 2022-05-04 ENCOUNTER — Telehealth: Payer: Self-pay

## 2022-05-04 MED ORDER — PEG 3350-KCL-NABCB-NACL-NASULF 236 G PO SOLR: 4000.0000 mL | Freq: Once | ORAL | 0 refills | Status: AC

## 2022-05-04 NOTE — Telephone Encounter (Signed)
Patient is calling because she wants a cheaper prep sent to the pharmacy. She states the current one is two expensive

## 2022-05-04 NOTE — Telephone Encounter (Signed)
Called patient back and inform her that the only other option would be to send Golytely. It will also be cheaper out of pocket if needed not $100 like Clenpiq.  Patient verbalized understanding.  Rx sent and new instructions will be sent.

## 2022-06-02 ENCOUNTER — Encounter: Payer: Self-pay | Admitting: Gastroenterology

## 2022-06-07 ENCOUNTER — Emergency Department: Payer: Medicare HMO

## 2022-06-07 ENCOUNTER — Ambulatory Visit: Payer: Medicare HMO | Admitting: Anesthesiology

## 2022-06-07 ENCOUNTER — Encounter: Payer: Self-pay | Admitting: Emergency Medicine

## 2022-06-07 ENCOUNTER — Emergency Department
Admission: EM | Admit: 2022-06-07 | Discharge: 2022-06-07 | Disposition: A | Discharge status: Home / Self Care | Payer: Medicare HMO | Source: Home / Self Care | Attending: Emergency Medicine | Admitting: Emergency Medicine

## 2022-06-07 ENCOUNTER — Encounter
Admission: RE | Disposition: A | Discharge status: Home / Self Care | Payer: Self-pay | Source: Home / Self Care | Attending: Gastroenterology

## 2022-06-07 ENCOUNTER — Other Ambulatory Visit: Payer: Self-pay

## 2022-06-07 ENCOUNTER — Ambulatory Visit
Admission: RE | Admit: 2022-06-07 | Discharge: 2022-06-07 | Disposition: A | Discharge status: Home / Self Care | Payer: Medicare HMO | Attending: Gastroenterology | Admitting: Gastroenterology

## 2022-06-07 DIAGNOSIS — Z1211 Encounter for screening for malignant neoplasm of colon: Secondary | ICD-10-CM | POA: Insufficient documentation

## 2022-06-07 DIAGNOSIS — Z860100 Personal history of colon polyps, unspecified: Secondary | ICD-10-CM

## 2022-06-07 DIAGNOSIS — J441 Chronic obstructive pulmonary disease with (acute) exacerbation: Secondary | ICD-10-CM | POA: Insufficient documentation

## 2022-06-07 DIAGNOSIS — R0602 Shortness of breath: Secondary | ICD-10-CM | POA: Diagnosis not present

## 2022-06-07 DIAGNOSIS — Z1152 Encounter for screening for COVID-19: Secondary | ICD-10-CM | POA: Insufficient documentation

## 2022-06-07 DIAGNOSIS — K5730 Diverticulosis of large intestine without perforation or abscess without bleeding: Secondary | ICD-10-CM | POA: Insufficient documentation

## 2022-06-07 DIAGNOSIS — J439 Emphysema, unspecified: Secondary | ICD-10-CM | POA: Diagnosis not present

## 2022-06-07 DIAGNOSIS — Z01810 Encounter for preprocedural cardiovascular examination: Secondary | ICD-10-CM | POA: Diagnosis not present

## 2022-06-07 DIAGNOSIS — Z20822 Contact with and (suspected) exposure to covid-19: Secondary | ICD-10-CM | POA: Insufficient documentation

## 2022-06-07 DIAGNOSIS — Z09 Encounter for follow-up examination after completed treatment for conditions other than malignant neoplasm: Secondary | ICD-10-CM | POA: Diagnosis not present

## 2022-06-07 DIAGNOSIS — K641 Second degree hemorrhoids: Secondary | ICD-10-CM | POA: Insufficient documentation

## 2022-06-07 DIAGNOSIS — J449 Chronic obstructive pulmonary disease, unspecified: Secondary | ICD-10-CM | POA: Insufficient documentation

## 2022-06-07 HISTORY — PX: COLONOSCOPY WITH PROPOFOL: SHX5780

## 2022-06-07 LAB — BASIC METABOLIC PANEL
Anion gap: 9 (ref 5–15)
BUN: 11 mg/dL (ref 8–23)
CO2: 27 mmol/L (ref 22–32)
Calcium: 8.5 mg/dL — ABNORMAL LOW (ref 8.9–10.3)
Chloride: 103 mmol/L (ref 98–111)
Creatinine, Ser: 0.87 mg/dL (ref 0.44–1.00)
GFR, Estimated: >60 mL/min (ref >60)
Glucose, Bld: 136 mg/dL — ABNORMAL HIGH (ref 70–99)
Potassium: 3.2 mmol/L — ABNORMAL LOW (ref 3.5–5.1)
Sodium: 139 mmol/L (ref 135–145)

## 2022-06-07 LAB — CBC WITH DIFFERENTIAL/PLATELET
Abs Immature Granulocytes: 0.05 10*3/uL (ref 0.00–0.07)
Basophils Absolute: 0 10*3/uL (ref 0.0–0.1)
Basophils Relative: 0 %
Eosinophils Absolute: 0.1 10*3/uL (ref 0.0–0.5)
Eosinophils Relative: 1 %
HCT: 39.2 % (ref 36.0–46.0)
Hemoglobin: 12 g/dL (ref 12.0–15.0)
Immature Granulocytes: 1 %
Lymphocytes Relative: 13 %
Lymphs Abs: 1.5 10*3/uL (ref 0.7–4.0)
MCH: 26.7 pg (ref 26.0–34.0)
MCHC: 30.6 g/dL (ref 30.0–36.0)
MCV: 87.3 fL (ref 80.0–100.0)
Monocytes Absolute: 0.5 10*3/uL (ref 0.1–1.0)
Monocytes Relative: 4 %
Neutro Abs: 9 10*3/uL — ABNORMAL HIGH (ref 1.7–7.7)
Neutrophils Relative %: 81 %
Platelets: 276 10*3/uL (ref 150–400)
RBC: 4.49 MIL/uL (ref 3.87–5.11)
RDW: 14.7 % (ref 11.5–15.5)
WBC: 11 10*3/uL — ABNORMAL HIGH (ref 4.0–10.5)
nRBC: 0 % (ref 0.0–0.2)

## 2022-06-07 LAB — RESP PANEL BY RT-PCR (RSV, FLU A&B, COVID)  RVPGX2
Influenza A by PCR: NEGATIVE
Influenza B by PCR: NEGATIVE
Resp Syncytial Virus by PCR: NEGATIVE
SARS Coronavirus 2 by RT PCR: NEGATIVE

## 2022-06-07 LAB — GROUP A STREP BY PCR: Group A Strep by PCR: NOT DETECTED

## 2022-06-07 SURGERY — COLONOSCOPY WITH PROPOFOL
Anesthesia: General | Site: Rectum

## 2022-06-07 MED ORDER — LACTATED RINGERS IV SOLN: INTRAVENOUS | Status: DC

## 2022-06-07 MED ORDER — STERILE WATER FOR IRRIGATION IR SOLN
Status: DC | PRN
  Administered 2022-06-07: 60 mL

## 2022-06-07 MED ORDER — METHYLPREDNISOLONE SODIUM SUCC 125 MG IJ SOLR
125.0000 mg | Freq: Once | INTRAMUSCULAR | Status: AC
  Administered 2022-06-07: 125 mg via INTRAVENOUS
  Filled 2022-06-07: qty 2

## 2022-06-07 MED ORDER — SODIUM CHLORIDE 0.9 % IV SOLN: INTRAVENOUS | Status: DC

## 2022-06-07 MED ORDER — PROPOFOL 10 MG/ML IV BOLUS
INTRAVENOUS | Status: DC | PRN
  Administered 2022-06-07: 80 mg via INTRAVENOUS
  Administered 2022-06-07: 40 mg via INTRAVENOUS

## 2022-06-07 MED ORDER — IPRATROPIUM-ALBUTEROL 0.5-2.5 (3) MG/3ML IN SOLN
3.0000 mL | Freq: Once | RESPIRATORY_TRACT | Status: AC
  Administered 2022-06-07: 3 mL via RESPIRATORY_TRACT
  Filled 2022-06-07: qty 3

## 2022-06-07 MED ORDER — STERILE WATER FOR IRRIGATION IR SOLN
Status: DC | PRN
  Administered 2022-06-07: 100 mL

## 2022-06-07 MED ORDER — PREDNISONE 20 MG PO TABS: 60.0000 mg | ORAL_TABLET | Freq: Every day | ORAL | 0 refills | Status: AC

## 2022-06-07 MED ORDER — ACETAMINOPHEN-CODEINE 300-30 MG PO TABS: 1.0000 | ORAL_TABLET | Freq: Four times a day (QID) | ORAL | 0 refills | Status: AC | PRN

## 2022-06-07 MED ORDER — LIDOCAINE HCL (CARDIAC) PF 100 MG/5ML IV SOSY
PREFILLED_SYRINGE | INTRAVENOUS | Status: DC | PRN
  Administered 2022-06-07: 40 mg via INTRAVENOUS

## 2022-06-07 SURGICAL SUPPLY — 5 items
GOWN CVR UNV OPN BCK APRN NK (MISCELLANEOUS) ×2 IMPLANT
GOWN ISOL THUMB LOOP REG UNIV (MISCELLANEOUS) ×2
KIT PRC NS LF DISP ENDO (KITS) ×1 IMPLANT
KIT PROCEDURE OLYMPUS (KITS) ×1
MANIFOLD NEPTUNE II (INSTRUMENTS) ×1 IMPLANT

## 2022-06-07 NOTE — ED Provider Notes (Signed)
[facility] Provider Note    Event Date/Time   First MD Initiated Contact with Patient 06/07/22 1502     (approximate)   History   Shortness of Breath   HPI  Kelly Weber is a 77 y.o. female with a history of of COPD, hyperlipidemia, and GERD who presents with shortness of breath, acutely worsened this afternoon shortly after the patient had a colonoscopy.  She states that over the last month she has had worsening gradual onset shortness of breath especially with exertion.  She is taking inhalers for COPD but states that they are not helping with this.  She denies fever.  She has a chronic cough.  She was feeling okay before starting the colonoscopy this morning, but then afterwards started to feel a sore throat, tightness in her lungs, and shortness of breath.  She has not had a nonproductive cough since the procedure.  She denies any vomiting or diarrhea and has had no fever or chest pain.  I reviewed the past medical records.  I confirmed that the patient had a colonoscopy this morning with Dr. Verl Blalock which was uncomplicated with no notable intraprocedure events.   Physical Exam   Triage Vital Signs: ED Triage Vitals  Enc Vitals Group     BP 06/07/22 1303 137/73     Pulse Rate 06/07/22 1303 79     Resp 06/07/22 1303 (!) 22     Temp 06/07/22 1300 97.8 F (36.6 C)     Temp Source 06/07/22 1300 Oral     SpO2 06/07/22 1303 94 %     Weight 06/07/22 1300 213 lb 13.5 oz (97 kg)     Height 06/07/22 1300 '5\' 4"'$  (1.626 m)     Head Circumference --      Peak Flow --      Pain Score 06/07/22 1259 0     Pain Loc --      Pain Edu? --      Excl. in [place]? --     Most recent vital signs: Vitals:   06/07/22 1512 06/07/22 1832  BP: (!) 144/102 (!) 140/99  Pulse: 89 80  Resp: 20 20  Temp: 99.3 F (37.4 C)   SpO2: 94% 95%     General: Awake, no distress.  CV:  Good peripheral perfusion.  Normal heart. Resp:  Normal effort.  Faint wheezes and decreased  breath sounds bilaterally. Abd:  No distention.  Other:  Oropharynx with slight erythema but no exudate.  Clear voice.   ED Results / Procedures / Treatments   Labs (all labs ordered are listed, but only abnormal results are displayed) Labs Reviewed  CBC WITH DIFFERENTIAL/PLATELET - Abnormal; Notable for the following components:      Result Value   WBC 11.0 (*)    Neutro Abs 9.0 (*)    All other components within normal limits  BASIC METABOLIC PANEL - Abnormal; Notable for the following components:   Potassium 3.2 (*)    Glucose, Bld 136 (*)    Calcium 8.5 (*)    All other components within normal limits  RESP PANEL BY RT-PCR (RSV, FLU A&B, COVID)  [id number]  GROUP A STREP BY PCR     EKG  ED ECG REPORT I, Arta Silence, the attending physician, personally viewed and interpreted this ECG.  Date: 06/07/2022 EKG Time: 1307 Rate: 83 Rhythm: normal sinus rhythm QRS Axis: normal Intervals: normal ST/T Wave abnormalities: Nonspecific T wave abnormalities Narrative Interpretation: no evidence  of acute ischemia    RADIOLOGY  Chest x-ray: I independently viewed and interpreted the images; there is no focal consolidation or edema   PROCEDURES:  Critical Care performed: No  Procedures   MEDICATIONS ORDERED IN ED: Medications  ipratropium-albuterol (DUONEB) 0.5-2.5 (3) MG/3ML nebulizer solution 3 mL (3 mLs Nebulization Given 06/07/22 1547)  ipratropium-albuterol (DUONEB) 0.5-2.5 (3) MG/3ML nebulizer solution 3 mL (3 mLs Nebulization Given 06/07/22 1547)  methylPREDNISolone sodium succinate (SOLU-MEDROL) 125 mg/2 mL injection 125 mg (125 mg Intravenous Given 06/07/22 1547)     IMPRESSION / MDM / ASSESSMENT AND PLAN / ED COURSE  I reviewed the triage vital signs and the nursing notes.  77 year old female with PMH as noted above presents with subacute worsening shortness of breath over the last month with acutely worsening shortness of breath and cough after a  colonoscopy this morning.  Exam reveals decreased breath sounds, some wheezing, and borderline elevated temperature.  The patient is not requiring any oxygen and does not demonstrate acute respiratory distress.  Differential diagnosis includes, but is not limited to, COPD exacerbation, pneumonia, influenza or other viral syndrome, anesthesia side effect.  We will obtain lab workup, chest x-ray, respiratory panel, strep swab, and reassess.  Patient's presentation is most consistent with acute presentation with potential threat to life or bodily function.  The patient is on the cardiac monitor to evaluate for evidence of arrhythmia and/or significant heart rate changes.  ----------------------------------------- 6:06 PM on 06/07/2022 -----------------------------------------  Basic labs are unremarkable with normal electrolytes and minimal leukocytosis.  Respiratory panel and strep swab are both negative.  On reassessment, the patient states she is feeling significantly better.  She appears well.  She has a normal O2 saturation on room air.  At this time, she is stable for discharge home with treatment for COPD exacerbation.  If she agrees to follow-up with her primary care provider.  Return precautions given, and she expresses understanding.  FINAL CLINICAL IMPRESSION(S) / ED DIAGNOSES   Final diagnoses:  COPD exacerbation ([place])     Rx / DC Orders   ED Discharge Orders          Ordered    predniSONE (DELTASONE) 20 MG tablet  Daily with breakfast        06/07/22 1805    acetaminophen-codeine (TYLENOL #3) 300-30 MG tablet  Every 6 hours PRN        06/07/22 1805             Note:  This document was prepared using Dragon voice recognition software and may include unintentional dictation errors.    Arta Silence, MD 06/07/22 2142

## 2022-06-07 NOTE — Op Note (Signed)
[facility] Gastroenterology Patient Name: Kelly Weber Procedure Date: 06/07/2022 10:00 AM MRN: [id number] Account #: [id number] Date of Birth: 1946-03-14 Admit Type: Outpatient Age: 77 Room: [facility] OR ROOM 01 Gender: Female Note Status: Finalized Instrument Name: [id number] Procedure:             Colonoscopy Indications:           High risk colon cancer surveillance: Personal history                         of colonic polyps Providers:             Lucilla Lame MD, MD Referring MD:          Olin Hauser (Referring MD) Medicines:             Propofol per Anesthesia Complications:         No immediate complications. Procedure:             Pre-Anesthesia Assessment:                        - Prior to the procedure, a History and Physical was                         performed, and patient medications and allergies were                         reviewed. The patient's tolerance of previous                         anesthesia was also reviewed. The risks and benefits                         of the procedure and the sedation options and risks                         were discussed with the patient. All questions were                         answered, and informed consent was obtained. Prior                         Anticoagulants: The patient has taken no anticoagulant                         or antiplatelet agents. ASA Grade Assessment: II - A                         patient with mild systemic disease. After reviewing                         the risks and benefits, the patient was deemed in                         satisfactory condition to undergo the procedure.                        After obtaining informed consent, the colonoscope was  passed under direct vision. Throughout the procedure,                         the patient's blood pressure, pulse, and oxygen                         saturations were monitored continuously. The                          Colonoscope was introduced through the anus and                         advanced to the the cecum, identified by appendiceal                         orifice and ileocecal valve. The colonoscopy was                         performed without difficulty. The patient tolerated                         the procedure well. The quality of the bowel                         preparation was excellent. Findings:      The perianal and digital rectal examinations were normal.      Non-bleeding internal hemorrhoids were found during retroflexion. The       hemorrhoids were Grade II (internal hemorrhoids that prolapse but reduce       spontaneously).      A few small-mouthed diverticula were found in the sigmoid colon. Impression:            - Non-bleeding internal hemorrhoids.                        - Diverticulosis in the sigmoid colon.                        - No specimens collected. Recommendation:        - Discharge patient to home.                        - Resume previous diet.                        - Continue present medications.                        - Repeat colonoscopy is not recommended for                         surveillance. Procedure Code(s):     --- Professional ---                        [phone], Colonoscopy, flexible; diagnostic, including                         collection of specimen(s) by brushing or washing, when  performed (separate procedure) Diagnosis Code(s):     --- Professional ---                        Z86.010, Personal history of colonic polyps CPT copyright 2022 American Medical Association. All rights reserved. The codes documented in this report are preliminary and upon coder review may  be revised to meet current compliance requirements. Lucilla Lame MD, MD 06/07/2022 10:21:56 AM This report has been signed electronically. Number of Addenda: 0 Note Initiated On: 06/07/2022 10:00 AM Scope Withdrawal Time: 0 hours 6 minutes 59 seconds   Total Procedure Duration: 0 hours 10 minutes 49 seconds  Estimated Blood Loss:  Estimated blood loss: none.      [facility]

## 2022-06-07 NOTE — Discharge Instructions (Addendum)
Use your albuterol inhaler every 4-6 hours for the next several days.  Take the prednisone as prescribed starting tomorrow.  Use the Tylenol with codeine as needed for cough or for sleep tonight.  Follow-up with your regular doctor.  Return to the ER for new, worsening, or persistent severe shortness of breath, fever, weakness, chest pain, or any other new or worsening symptoms that concern you.

## 2022-06-07 NOTE — ED Triage Notes (Signed)
Pt reports having a colonoscopy today and woke up from procedure feeling SOB. States she is coughing up phlegm and can't breath well.

## 2022-06-07 NOTE — Anesthesia Preprocedure Evaluation (Signed)
Anesthesia Evaluation  Patient identified by MRN, date of birth, ID band Patient awake    Reviewed: Allergy & Precautions, H&P , NPO status , Patient's Chart, lab work & pertinent test results, reviewed documented beta blocker date and time   Airway Mallampati: II   Neck ROM: full    Dental  (+) Poor Dentition   Pulmonary neg pulmonary ROS, shortness of breath, COPD,  COPD inhaler, former smoker   Pulmonary exam normal        Cardiovascular Exercise Tolerance: Good negative cardio ROS Normal cardiovascular exam Rhythm:regular Rate:Normal     Neuro/Psych  PSYCHIATRIC DISORDERS Anxiety Depression     Neuromuscular disease negative neurological ROS  negative psych ROS   GI/Hepatic negative GI ROS, Neg liver ROS, hiatal hernia,GERD  ,,  Endo/Other  negative endocrine ROS    Renal/GU negative Renal ROS  negative genitourinary   Musculoskeletal  (+) Arthritis ,    Abdominal   Peds  Hematology negative hematology ROS (+) Blood dyscrasia, anemia   Anesthesia Other Findings Past Medical History: No date: Allergies No date: Anxiety No date: Arthritis     Comment:  fingers, left knee No date: Depression No date: Diverticulitis No date: Dyspnea No date: Fatigue No date: GERD (gastroesophageal reflux disease) No date: HLD (hyperlipidemia) 2006: Insomnia No date: Lower back pain 2007: Skin cancer     Comment:  Basal Cell CA resected from Left middle finger Past Surgical History: No date: BREAST CYST ASPIRATION; Bilateral     Comment:  30 years ago 2023: CATARACT EXTRACTION W/ INTRAOCULAR LENS IMPLANT; Bilateral     Comment:  [place] 11/20/2014: COLONOSCOPY WITH PROPOFOL; N/A     Comment:  Procedure: COLONOSCOPY WITH PROPOFOL;  Surgeon: Lucilla Lame, MD;  Location: [facility];  Service:               Endoscopy;  Laterality: N/A; 11/20/2014: ESOPHAGOGASTRODUODENOSCOPY (EGD) WITH PROPOFOL;  N/A     Comment:  Procedure: ESOPHAGOGASTRODUODENOSCOPY (EGD) WITH               PROPOFOL;  Surgeon: Lucilla Lame, MD;  Location: [facility];  Service: Endoscopy;  Laterality: N/A; 11/20/2014: POLYPECTOMY     Comment:  Procedure: POLYPECTOMY;  Surgeon: Lucilla Lame, MD;                Location: [facility];  Service: Endoscopy;; No date: SKIN CANCER EXCISION; Left     Comment:  finger 1970: TUBAL LIGATION BMI    Body Mass Index: 36.90 kg/m     Reproductive/Obstetrics negative OB ROS                             Anesthesia Physical Anesthesia Plan  ASA: 3  Anesthesia Plan: General   Post-op Pain Management:    Induction:   PONV Risk Score and Plan: 3  Airway Management Planned:   Additional Equipment:   Intra-op Plan:   Post-operative Plan:   Informed Consent: I have reviewed the patients History and Physical, chart, labs and discussed the procedure including the risks, benefits and alternatives for the proposed anesthesia with the patient or authorized representative who has indicated his/her understanding and acceptance.     Dental Advisory Given  Plan Discussed with: CRNA  Anesthesia Plan Comments:  Anesthesia Quick Evaluation  

## 2022-06-07 NOTE — Transfer of Care (Signed)
Immediate Anesthesia Transfer of Care Note  Patient: Kelly Weber  Procedure(s) Performed: COLONOSCOPY WITH PROPOFOL (Rectum)  Patient Location: PACU  Anesthesia Type: General  Level of Consciousness: awake, alert  and patient cooperative  Airway and Oxygen Therapy: Patient Spontanous Breathing and Patient connected to supplemental oxygen  Post-op Assessment: Post-op Vital signs reviewed, Patient's Cardiovascular Status Stable, Respiratory Function Stable, Patent Airway and No signs of Nausea or vomiting  Post-op Vital Signs: Reviewed and stable  Complications: No notable events documented.

## 2022-06-07 NOTE — H&P (Signed)
Lucilla Lame, MD [facility] [address], [place], [place] [address] Phone:[phone] Fax : [phone]  Primary Care Physician:  Olin Hauser, DO Primary Gastroenterologist:  Dr. Allen Norris  Pre-Procedure History & Physical: HPI:  Kelly Weber is a 77 y.o. female is here for an colonoscopy.   Past Medical History:  Diagnosis Date   Allergies    Anxiety    Arthritis    fingers, left knee   Depression    Diverticulitis    Dyspnea    Fatigue    GERD (gastroesophageal reflux disease)    HLD (hyperlipidemia)    Insomnia 2006   Lower back pain    Skin cancer 2007   Basal Cell CA resected from Left middle finger    Past Surgical History:  Procedure Laterality Date   BREAST CYST ASPIRATION Bilateral    30 years ago   CATARACT EXTRACTION W/ INTRAOCULAR LENS IMPLANT Bilateral 2023   [place]   COLONOSCOPY WITH PROPOFOL N/A 11/20/2014   Procedure: COLONOSCOPY WITH PROPOFOL;  Surgeon: Lucilla Lame, MD;  Location: [facility];  Service: Endoscopy;  Laterality: N/A;   ESOPHAGOGASTRODUODENOSCOPY (EGD) WITH PROPOFOL N/A 11/20/2014   Procedure: ESOPHAGOGASTRODUODENOSCOPY (EGD) WITH PROPOFOL;  Surgeon: Lucilla Lame, MD;  Location: [facility];  Service: Endoscopy;  Laterality: N/A;   POLYPECTOMY  11/20/2014   Procedure: POLYPECTOMY;  Surgeon: Lucilla Lame, MD;  Location: [facility];  Service: Endoscopy;;   SKIN CANCER EXCISION Left    finger   TUBAL LIGATION  1970    Prior to Admission medications   Medication Sig Start Date End Date Taking? Authorizing Provider  albuterol (VENTOLIN HFA) 108 (90 Base) MCG/ACT inhaler Inhale 2 puffs into the lungs every 6 (six) hours as needed for wheezing or shortness of breath. 12/27/21  Yes Letitia Neri L, PA-C  azelastine (ASTELIN) 0.1 % nasal spray Place 2 sprays into both nostrils 2 (two) times daily. Use in each nostril as directed 04/27/22  Yes Karamalegos, Devonne Doughty, DO  Calcium Carbonate-Vit D-Min  (CALCIUM 1200 PO) Take by mouth daily.   Yes [provider]  Ciclopirox 1 % shampoo Apply 10 mLs topically 2 (two) times a week. To scalp with wet hair. For total 4 weeks; allow a minimum of 3 days between applications [id number]  Yes Karamalegos, Devonne Doughty, DO  Multiple Vitamins-Minerals (ALIVE WOMENS ENERGY PO) Take by mouth daily.   Yes [provider]  Multiple Vitamins-Minerals (ZINC PO) Take by mouth daily.   Yes [provider]  Skin Protectants, Misc. (EUCERIN) cream Apply topically as needed for dry skin.   Yes [provider]  TURMERIC PO Take by mouth daily.   Yes [provider]  valACYclovir (VALTREX) 1000 MG tablet Take 2,000 mg by mouth 2 (two) times daily as needed (fever blisters / rash).    Yes [provider]  venlafaxine XR (EFFEXOR-XR) 37.5 MG 24 hr capsule TAKE ONE CAPSULE BY MOUTH EVERY MORNING WITH BREAKFAST 03/17/22  Yes Karamalegos, Devonne Doughty, DO  acetic acid-hydrocortisone (VOSOL-HC) OTIC solution Place 3 drops into the right ear 3 (three) times daily. Patient not taking: Reported on 06/04/2021 02/17/21   Olin Hauser, DO  baclofen (LIORESAL) 10 MG tablet Take 0.5-1 tablets (5-10 mg total) by mouth 3 (three) times daily as needed for muscle spasms. Patient not taking: Reported on 06/04/2021 06/13/20   Olin Hauser, DO  CLENPIQ 10-3.5-12 MG-GM -GM/175ML SOLN Take by mouth. 04/26/22   [provider]  Allergies as of 04/23/2022 - Review Complete 12/27/2021  Allergen Reaction Noted   Penicillins Hives and Swelling 09/23/2014    Family History  Problem Relation Age of Onset   Brain cancer Mother    Thyroid disease Mother    Hypertension Mother    Breast cancer Maternal Aunt 26   Breast cancer Cousin 35   Breast cancer Maternal Aunt     Social History   Socioeconomic History   Marital status: Married    Spouse name: Josette Laursen   Number of children: Not on file   Years of  education: Not on file   Highest education level: Not on file  Occupational History   Occupation: Retired   Occupation: retired  Tobacco Use   Smoking status: Former    Packs/day: 1.00    Years: 30.00    Total pack years: 30.00    Types: Cigarettes    Quit date: 01/19/2014    Years since quitting: 8.3   Smokeless tobacco: Former  Scientific laboratory technician Use: Never used  Substance and Sexual Activity   Alcohol use: No    Alcohol/week: 0.0 standard drinks of alcohol   Drug use: No   Sexual activity: Not Currently  Other Topics Concern   Not on file  Social History Narrative   Not on file   Social Determinants of Health   Financial Resource Strain: Low Risk  (06/04/2021)   Overall Financial Resource Strain (CARDIA)    Difficulty of Paying Living Expenses: Not hard at all  Food Insecurity: No Food Insecurity (06/04/2021)   Hunger Vital Sign    Worried About Running Out of Food in the Last Year: Never true    [place] in the Last Year: Never true  Transportation Needs: No Transportation Needs (06/04/2021)   PRAPARE - Hydrologist (Medical): No    Lack of Transportation (Non-Medical): No  Physical Activity: Sufficiently Active (06/04/2021)   Exercise Vital Sign    Days of Exercise per Week: 5 days    Minutes of Exercise per Session: 30 min  Stress: No Stress Concern Present (06/04/2021)   [place]    Feeling of Stress : Not at all  Social Connections: Moderately Integrated (06/04/2021)   Social Connection and Isolation Panel [NHANES]    Frequency of Communication with Friends and Family: More than three times a week    Frequency of Social Gatherings with Friends and Family: Twice a week    Attends Religious Services: 1 to 4 times per year    Active Member of [employer] or Organizations: No    Attends Archivist Meetings: Never    Marital Status: Married  Human resources officer  Violence: Not At Risk (06/04/2021)   Humiliation, Afraid, Rape, and Kick questionnaire    Fear of Current or Ex-Partner: No    Emotionally Abused: No    Physically Abused: No    Sexually Abused: No    Review of Systems: See HPI, otherwise negative ROS  Physical Exam: BP 139/75   Pulse 69   Temp 98 F (36.7 C) (Temporal)   Resp (!) 21   Ht '5\' 4"'$  (1.626 m)   Wt 97.5 kg   SpO2 97%   BMI 36.90 kg/m  General:   Alert,  pleasant and cooperative in NAD Head:  Normocephalic and atraumatic. Neck:  Supple; no masses or thyromegaly. Lungs:  Clear throughout to auscultation.  Heart:  Regular rate and rhythm. Abdomen:  Soft, nontender and nondistended. Normal bowel sounds, without guarding, and without rebound.   Neurologic:  Alert and  oriented x4;  grossly normal neurologically.  Impression/Plan: Kelly Weber is here for an colonoscopy to be performed for a history of adenomatous polyps on 2016   Risks, benefits, limitations, and alternatives regarding  colonoscopy have been reviewed with the patient.  Questions have been answered.  All parties agreeable.   Lucilla Lame, MD  06/07/2022, 9:30 AM

## 2022-06-08 ENCOUNTER — Ambulatory Visit: Payer: Self-pay

## 2022-06-08 ENCOUNTER — Encounter: Payer: Self-pay | Admitting: Gastroenterology

## 2022-06-08 NOTE — Telephone Encounter (Signed)
     Chief Complaint: Cough, SOB with exertion. Requests appointment Friday. Symptoms: Above Frequency: Yesterday Pertinent Negatives: Patient denies fever or chest pain Disposition: []$ ED /[]$ Urgent Care (no appt availability in office) / [x]$ Appointment(In office/virtual)/ []$  [facility]/ []$ Home Care/ []$ Refused Recommended Disposition /[]$ [facility]/ []$  Follow-up with PCP Additional Notes: Call back if symptoms worsen.  Reason for Disposition  [1] MODERATE longstanding difficulty breathing (e.g., speaks in phrases, SOB even at rest, pulse 100-120) AND [2] SAME as normal  Answer Assessment - Initial Assessment Questions 1. RESPIRATORY STATUS: "Describe your breathing?" (e.g., wheezing, shortness of breath, unable to speak, severe coughing)      SOB 2. ONSET: "When did this breathing problem begin?"      Yesterday 3. PATTERN "Does the difficult breathing come and go, or has it been constant since it started?"      Comes and goes 4. SEVERITY: "How bad is your breathing?" (e.g., mild, moderate, severe)    - MILD: No SOB at rest, mild SOB with walking, speaks normally in sentences, can lie down, no retractions, pulse < 100.    - MODERATE: SOB at rest, SOB with minimal exertion and prefers to sit, cannot lie down flat, speaks in phrases, mild retractions, audible wheezing, pulse 100-120.    - SEVERE: Very SOB at rest, speaks in single words, struggling to breathe, sitting hunched forward, retractions, pulse > 120      Moderate 5. RECURRENT SYMPTOM: "Have you had difficulty breathing before?" If Yes, ask: "When was the last time?" and "What happened that time?"      Yes 6. CARDIAC HISTORY: "Do you have any history of heart disease?" (e.g., heart attack, angina, bypass surgery, angioplasty)      No 7. LUNG HISTORY: "Do you have any history of lung disease?"  (e.g., pulmonary embolus, asthma, emphysema)     COPD 8. CAUSE: "What do you think is causing the breathing  problem?"      Unsure 9. OTHER SYMPTOMS: "Do you have any other symptoms? (e.g., dizziness, runny nose, cough, chest pain, fever)     Cough 10. O2 SATURATION MONITOR:  "Do you use an oxygen saturation monitor (pulse oximeter) at home?" If Yes, ask: "What is your reading (oxygen level) today?" "What is your usual oxygen saturation reading?" (e.g., 95%)       No 11. PREGNANCY: "Is there any chance you are pregnant?" "When was your last menstrual period?"       No 12. TRAVEL: "Have you traveled out of the country in the last month?" (e.g., travel history, exposures)       No  Protocols used: Breathing Difficulty-A-AH

## 2022-06-11 ENCOUNTER — Ambulatory Visit (INDEPENDENT_AMBULATORY_CARE_PROVIDER_SITE_OTHER): Payer: Medicare HMO | Admitting: Family Medicine

## 2022-06-11 ENCOUNTER — Encounter: Payer: Self-pay | Admitting: Family Medicine

## 2022-06-11 VITALS — BP 116/78 | HR 69 | Temp 97.1°F | Wt 221.0 lb

## 2022-06-11 DIAGNOSIS — J432 Centrilobular emphysema: Secondary | ICD-10-CM

## 2022-06-11 MED ORDER — BREZTRI AEROSPHERE 160-9-4.8 MCG/ACT IN AERO: 2.0000 | INHALATION_SPRAY | Freq: Two times a day (BID) | RESPIRATORY_TRACT | 11 refills | Status: DC

## 2022-06-11 NOTE — Patient Instructions (Addendum)
Thank you for coming to the office today.  Breztri 2 puff twice a day, for daily maintenance of COPD Emphysema, should feel less shortness of breath and overall better  If not quite improved 2-4 weeks, let me know, we can refer to the Lung Doctors for further evaluation and testing.  At this point, I think the anesthesia made you nausea and sick but I think the lungs are the primary concern  Please schedule a Follow-up Appointment to: Return if symptoms worsen or fail to improve.  If you have any other questions or concerns, please feel free to call the office or send a message through [facility]. You may also schedule an earlier appointment if necessary.  Additionally, you may be receiving a survey about your experience at our office within a few days to 1 week by e-mail or mail. We value your feedback.  Nobie Putnam, DO [facility]

## 2022-06-11 NOTE — Progress Notes (Signed)
Subjective:    Patient ID: Kelly Weber, female    DOB: 1946/02/17, 77 y.o.   MRN: [id number]  Kelly Weber is a 77 y.o. female presenting on 06/11/2022 for Cough (Started 02/26 after colonoscopy.  Doctors states she "Threw up a little" during the procedure.  /Still coughing, short of breath. )   HPI   ED FOLLOW-UP VISIT  Hospital/Location: [facility] Date of ED Visit: 06/07/22  Reason for Presenting to ED: AECOPD  FOLLOW-UP  - ED provider note and record have been reviewed - Patient presents today about 4 days after recent ED visit. Brief summary of recent course, patient had symptoms of choking vomiting after anesthesia colonoscopy, and dyspnea, cough, presented to ED on 2/26, treated with prednisone burst for COPD and tylenol 3 w/ codeine. - Today reports overall has done well after discharge from ED. Symptoms of cough have mostly resolved, but dyspnea still present with exertion. She has albuterol AS NEEDED but not using as often. No maintenance therapy  - New medications on discharge: prednisone, codeine - Changes to current meds on discharge: none  I have reviewed the discharge medication list, and have reconciled the current and discharge medications today.       06/11/2022    3:28 PM 06/04/2021   10:17 AM 06/04/2021    9:58 AM  Depression screen PHQ 2/9  Decreased Interest 1 0 0  Down, Depressed, Hopeless 1 0 0  PHQ - 2 Score 2 0 0  Altered sleeping '1 1 1  '$ Tired, decreased energy 1 0 0  Change in appetite 1 0 0  Feeling bad or failure about yourself  0 0 0  Trouble concentrating 0 0 0  Moving slowly or fidgety/restless 0 0 0  Suicidal thoughts 0 0 0  PHQ-9 Score '5 1 1  '$ Difficult doing work/chores Somewhat difficult Not difficult at all Not difficult at all    Social History   Tobacco Use   Smoking status: Former    Packs/day: 1.00    Years: 30.00    Total pack years: 30.00    Types: Cigarettes    Quit date: 01/19/2014    Years since quitting: 8.3   Smokeless  tobacco: Former  Scientific laboratory technician Use: Never used  Substance Use Topics   Alcohol use: No    Alcohol/week: 0.0 standard drinks of alcohol   Drug use: No    Review of Systems Per HPI unless specifically indicated above     Objective:    BP 116/78 (BP Location: Left Arm, Patient Position: Sitting, Cuff Size: Large)   Pulse 69   Temp (!) 97.1 F (36.2 C) (Temporal)   Wt 221 lb (100.2 kg)   SpO2 97%   BMI 37.93 kg/m   Wt Readings from Last 3 Encounters:  06/11/22 221 lb (100.2 kg)  06/07/22 213 lb 13.5 oz (97 kg)  06/07/22 215 lb (97.5 kg)    Physical Exam Vitals and nursing note reviewed.  Constitutional:      General: She is not in acute distress.    Appearance: She is well-developed. She is not diaphoretic.     Comments: Well-appearing, comfortable, cooperative  HENT:     Head: Normocephalic and atraumatic.  Eyes:     General:        Right eye: No discharge.        Left eye: No discharge.     Conjunctiva/sclera: Conjunctivae normal.  Neck:     Thyroid: No thyromegaly.  Cardiovascular:     Rate and Rhythm: Normal rate and regular rhythm.     Heart sounds: Normal heart sounds. No murmur heard. Pulmonary:     Effort: Pulmonary effort is normal. No respiratory distress.     Breath sounds: Wheezing present. No rales.  Musculoskeletal:        General: Normal range of motion.     Cervical back: Normal range of motion and neck supple.  Lymphadenopathy:     Cervical: No cervical adenopathy.  Skin:    General: Skin is warm and dry.     Findings: No erythema or rash.  Neurological:     Mental Status: She is alert and oriented to person, place, and time.  Psychiatric:        Behavior: Behavior normal.     Comments: Well groomed, good eye contact, normal speech and thoughts     I have personally reviewed the radiology report from 06/07/22 on Chest X-ray.   Study Result CLINICAL DATA: Shortness of breath  EXAM: CHEST - 2 VIEW  COMPARISON:  12/27/2021  FINDINGS: Heart size is normal. Chronic aortic atherosclerotic calcification is noted. There is chronic emphysema and pulmonary scarring. Prominent pericardial fat on the left as seen chronically. No sign of acute infiltrate, effusion or collapse.  IMPRESSION: Chronic emphysema and pulmonary scarring. No acute finding by radiography.   Electronically Signed By: Nelson Chimes M.D. On: 06/07/2022 13:40  Results for orders placed or performed during the hospital encounter of 06/07/22  Resp panel by RT-PCR (RSV, Flu A&B, Covid) Anterior Nasal Swab   Specimen: Anterior Nasal Swab  Result Value Ref Range   SARS Coronavirus 2 by RT PCR NEGATIVE NEGATIVE   Influenza A by PCR NEGATIVE NEGATIVE   Influenza B by PCR NEGATIVE NEGATIVE   Resp Syncytial Virus by PCR NEGATIVE NEGATIVE  Group A Strep by PCR   Specimen: Throat; Sterile Swab  Result Value Ref Range   Group A Strep by PCR NOT DETECTED NOT DETECTED  CBC with Differential  Result Value Ref Range   WBC 11.0 (H) 4.0 - 10.5 K/uL   RBC 4.49 3.87 - 5.11 MIL/uL   Hemoglobin 12.0 12.0 - 15.0 g/dL   HCT 39.2 36.0 - 46.0 %   MCV 87.3 80.0 - 100.0 fL   MCH 26.7 26.0 - 34.0 pg   MCHC 30.6 30.0 - 36.0 g/dL   RDW 14.7 11.5 - 15.5 %   Platelets 276 150 - 400 K/uL   nRBC 0.0 0.0 - 0.2 %   Neutrophils Relative % 81 %   Neutro Abs 9.0 (H) 1.7 - 7.7 K/uL   Lymphocytes Relative 13 %   Lymphs Abs 1.5 0.7 - 4.0 K/uL   Monocytes Relative 4 %   Monocytes Absolute 0.5 0.1 - 1.0 K/uL   Eosinophils Relative 1 %   Eosinophils Absolute 0.1 0.0 - 0.5 K/uL   Basophils Relative 0 %   Basophils Absolute 0.0 0.0 - 0.1 K/uL   Immature Granulocytes 1 %   Abs Immature Granulocytes 0.05 0.00 - 0.07 K/uL  Basic metabolic panel  Result Value Ref Range   Sodium 139 135 - 145 mmol/L   Potassium 3.2 (L) 3.5 - 5.1 mmol/L   Chloride 103 98 - 111 mmol/L   CO2 27 22 - 32 mmol/L   Glucose, Bld 136 (H) 70 - 99 mg/dL   BUN 11 8 - 23 mg/dL    Creatinine, Ser 0.87 0.44 - 1.00 mg/dL   Calcium 8.5 (L)  8.9 - 10.3 mg/dL   GFR, Estimated >60 >60 mL/min   Anion gap 9 5 - 15      Assessment & Plan:   Problem List Items Addressed This Visit     Centrilobular emphysema ([place]) - Primary   Relevant Medications   BREZTRI AEROSPHERE 160-9-4.8 MCG/ACT AERO    Clinically consistent with chronic dyspnea on exertion and COPD flares of productive sputum coughing wheezing. Former smoker. No active maintenance therapy Recent AECOPD resolved w/ steroid course.  Last X-ray 06/07/22 showed emphysema  Trial on Breztri 2 puff TWICE A DAY, sample in office 1 week and new rx sent.  Has existing Albuterol PRN  Follow - up, if not improving, can refer to Pulmonology for further work up and management  Meds ordered this encounter  Medications   BREZTRI AEROSPHERE 160-9-4.8 MCG/ACT AERO    Sig: Inhale 2 puffs into the lungs 2 (two) times daily.    Dispense:  10.7 g    Refill:  11      Follow up plan: Return if symptoms worsen or fail to improve.   Nobie Putnam, [facility] 06/11/2022, 3:21 PM

## 2022-06-14 ENCOUNTER — Other Ambulatory Visit: Payer: Self-pay | Admitting: Family Medicine

## 2022-06-14 DIAGNOSIS — F3342 Major depressive disorder, recurrent, in full remission: Secondary | ICD-10-CM

## 2022-06-14 NOTE — Anesthesia Postprocedure Evaluation (Signed)
Anesthesia Post Note  Patient: Kelly Weber  Procedure(s) Performed: COLONOSCOPY WITH PROPOFOL (Rectum)  Patient location during evaluation: PACU Anesthesia Type: General Level of consciousness: awake and alert Pain management: pain level controlled Vital Signs Assessment: post-procedure vital signs reviewed and stable Respiratory status: spontaneous breathing, nonlabored ventilation, respiratory function stable and patient connected to nasal cannula oxygen Cardiovascular status: blood pressure returned to baseline and stable Postop Assessment: no apparent nausea or vomiting Anesthetic complications: no   No notable events documented.   Last Vitals:  Vitals:   06/07/22 1105 06/07/22 1110  BP:    Pulse: 70 71  Resp: 17 17  Temp:    SpO2: 94% 95%    Last Pain:  Vitals:   06/08/22 1210  TempSrc:   PainSc: 0-No pain                 Molli Barrows

## 2022-06-15 ENCOUNTER — Telehealth: Payer: Self-pay

## 2022-06-15 NOTE — Telephone Encounter (Signed)
     Patient  visit on 2/26  at [facility]    Have you been able to follow up with your primary care physician? Yes   The patient was or was not able to obtain any needed medicine or equipment. Yes   Are there diet recommendations that you are having difficulty following? Na   Patient expresses understanding of discharge instructions and education provided has no other needs at this time.  Yes     [facility] [phone] [address], [place], [place] [address] Phone: [phone] Email: [email or web address]

## 2022-06-15 NOTE — Telephone Encounter (Signed)
Requested Prescriptions  Pending Prescriptions Disp Refills   venlafaxine XR (EFFEXOR-XR) 37.5 MG 24 hr capsule [Pharmacy Med Name: VENLAFAXINE HCL ER 37.5 MG CAP] 90 capsule 0    Sig: TAKE ONE CAPSULE BY MOUTH EVERY MORNING WITH BREAKFAST     Psychiatry: Antidepressants - SNRI - desvenlafaxine & venlafaxine Failed - 06/14/2022  6:22 AM      Failed - Lipid Panel in normal range within the last 12 months    Cholesterol, Total  Date Value Ref Range Status  05/29/2018 133 100 - 199 mg/dL Final   Cholesterol  Date Value Ref Range Status  01/23/2020 185 <200 mg/dL Final  02/14/2014 188 0 - 200 mg/dL Final   Ldl Cholesterol, Calc  Date Value Ref Range Status  02/14/2014 113 (H) 0 - 100 mg/dL Final   LDL Cholesterol (Calc)  Date Value Ref Range Status  01/23/2020 108 (H) mg/dL (calc) Final    Comment:    Reference range: <100 . Desirable range <100 mg/dL for primary prevention;   <70 mg/dL for patients with CHD or diabetic patients  with > or = 2 CHD risk factors. [name] Kitchen LDL-C is now calculated using the Martin-Hopkins  calculation, which is a validated novel method providing  better accuracy than the Friedewald equation in the  estimation of LDL-C.  Cresenciano Genre et al. Annamaria Helling. [id number]): 2061-2068  (http://education.[facility].com/faq/FAQ164)    HDL Cholesterol  Date Value Ref Range Status  02/14/2014 55 40 - 60 mg/dL Final   HDL  Date Value Ref Range Status  01/23/2020 55 > OR = 50 mg/dL Final  05/29/2018 60 >39 mg/dL Final   Triglycerides  Date Value Ref Range Status  01/23/2020 117 <150 mg/dL Final  02/14/2014 101 0 - 200 mg/dL Final         Passed - Cr in normal range and within 360 days    Creat  Date Value Ref Range Status  01/23/2020 0.80 0.60 - 0.93 mg/dL Final    Comment:    For patients >91 years of age, the reference limit for Creatinine is approximately 13% higher for people identified as African-American. .    Creatinine, Ser  Date Value Ref  Range Status  06/07/2022 0.87 0.44 - 1.00 mg/dL Final         Passed - Completed PHQ-2 or PHQ-9 in the last 360 days      Passed - Last BP in normal range    BP Readings from Last 1 Encounters:  06/11/22 116/78         Passed - Valid encounter within last 6 months    Recent Outpatient Visits           4 days ago Centrilobular emphysema [facility])   [place], DO   1 month ago Seborrheic dermatitis of scalp   [place], DO   5 months ago Appointment canceled by hospital   [facility] Mecum, Dani Gobble, PA-C   5 months ago Viral conjunctivitis of both eyes   [facility] Mecum, Dani Gobble, [place]   1 year ago Right ear pain   [place], DO       Future Appointments             In 3 months Brendolyn Patty, MD [place]

## 2022-06-17 ENCOUNTER — Ambulatory Visit (INDEPENDENT_AMBULATORY_CARE_PROVIDER_SITE_OTHER): Payer: Medicare HMO

## 2022-06-17 VITALS — Ht 64.0 in | Wt 221.0 lb

## 2022-06-17 DIAGNOSIS — Z0000 Encounter for general adult medical examination without abnormal findings: Secondary | ICD-10-CM | POA: Diagnosis not present

## 2022-06-17 NOTE — Progress Notes (Signed)
I connected with  [name] on 06/17/22 by a audio enabled telemedicine application and verified that I am speaking with the correct person using two identifiers.  Patient Location: Home  Provider Location: Office/Clinic  I discussed the limitations of evaluation and management by telemedicine. The patient expressed understanding and agreed to proceed.  Subjective:   ELVINA RAN is a 77 y.o. female who presents for Medicare Annual (Subsequent) preventive examination.  Review of Systems     Cardiac Risk Factors include: advanced age (>4mn, >>48women);obesity (BMI >30kg/m2)     Objective:    Today's Vitals   06/17/22 1429  PainSc: 0-No pain   There is no height or weight on file to calculate BMI.     06/17/2022    2:36 PM 06/07/2022    2:44 PM 06/07/2022    9:19 AM 12/27/2021   10:09 AM 08/22/2020    9:20 AM 11/26/2016    7:39 PM 11/03/2014    1:45 PM  Advanced Directives  Does Patient Have a Medical Advance Directive? No No No No No No No  Would patient like information on creating a medical advance directive? No - Patient declined No - Patient declined Yes (MAU/Ambulatory/Procedural Areas - Information given) No - Patient declined       Current Medications (verified) Outpatient Encounter Medications as of 06/17/2022  Medication Sig   albuterol (VENTOLIN HFA) 108 (90 Base) MCG/ACT inhaler Inhale 2 puffs into the lungs every 6 (six) hours as needed for wheezing or shortness of breath.   azelastine (ASTELIN) 0.1 % nasal spray Place 2 sprays into both nostrils 2 (two) times daily. Use in each nostril as directed   BREZTRI AEROSPHERE 160-9-4.8 MCG/ACT AERO Inhale 2 puffs into the lungs 2 (two) times daily.   Calcium Carbonate-Vit D-Min (CALCIUM 1200 PO) Take by mouth daily.   Ciclopirox 1 % shampoo Apply 10 mLs topically 2 (two) times a week. To scalp with wet hair. For total 4 weeks; allow a minimum of 3 days between applications   Multiple Vitamins-Minerals (ALIVE WOMENS  ENERGY PO) Take by mouth daily.   Multiple Vitamins-Minerals (ZINC PO) Take by mouth daily.   Skin Protectants, Misc. (EUCERIN) cream Apply topically as needed for dry skin.   TURMERIC PO Take by mouth daily.   valACYclovir (VALTREX) 1000 MG tablet Take 2,000 mg by mouth 2 (two) times daily as needed (fever blisters / rash).    venlafaxine XR (EFFEXOR-XR) 37.5 MG 24 hr capsule TAKE ONE CAPSULE BY MOUTH EVERY MORNING WITH BREAKFAST   acetic acid-hydrocortisone (VOSOL-HC) OTIC solution Place 3 drops into the right ear 3 (three) times daily. (Patient not taking: Reported on 06/04/2021)   baclofen (LIORESAL) 10 MG tablet Take 0.5-1 tablets (5-10 mg total) by mouth 3 (three) times daily as needed for muscle spasms. (Patient not taking: Reported on 06/17/2022)   CLENPIQ 10-3.5-12 MG-GM -GM/175ML SOLN Take by mouth. (Patient not taking: Reported on 06/17/2022)   No facility-administered encounter medications on file as of 06/17/2022.    Allergies (verified) Penicillins   History: Past Medical History:  Diagnosis Date   Allergies    Anxiety    Arthritis    fingers, left knee   Depression    Diverticulitis    Dyspnea    Fatigue    GERD (gastroesophageal reflux disease)    HLD (hyperlipidemia)    Insomnia 2006   Lower back pain    Skin cancer 2007   Basal Cell CA resected from Left middle  finger   Past Surgical History:  Procedure Laterality Date   BREAST CYST ASPIRATION Bilateral    30 years ago   CATARACT EXTRACTION W/ INTRAOCULAR LENS IMPLANT Bilateral 2023   [place]   COLONOSCOPY WITH PROPOFOL N/A 11/20/2014   Procedure: COLONOSCOPY WITH PROPOFOL;  Surgeon: Lucilla Lame, MD;  Location: [place];  Service: Endoscopy;  Laterality: N/A;   COLONOSCOPY WITH PROPOFOL N/A 06/07/2022   Procedure: COLONOSCOPY WITH PROPOFOL;  Surgeon: Lucilla Lame, MD;  Location: [place];  Service: Endoscopy;  Laterality: N/A;   ESOPHAGOGASTRODUODENOSCOPY (EGD) WITH PROPOFOL N/A  11/20/2014   Procedure: ESOPHAGOGASTRODUODENOSCOPY (EGD) WITH PROPOFOL;  Surgeon: Lucilla Lame, MD;  Location: [place];  Service: Endoscopy;  Laterality: N/A;   POLYPECTOMY  11/20/2014   Procedure: POLYPECTOMY;  Surgeon: Lucilla Lame, MD;  Location: [place];  Service: Endoscopy;;   SKIN CANCER EXCISION Left    finger   TUBAL LIGATION  1970   Family History  Problem Relation Age of Onset   Brain cancer Mother    Thyroid disease Mother    Hypertension Mother    Breast cancer Maternal Aunt 26   Breast cancer Cousin 35   Breast cancer Maternal Aunt    Social History   Socioeconomic History   Marital status: Married    Spouse name: Shanty Sica   Number of children: Not on file   Years of education: Not on file   Highest education level: Not on file  Occupational History   Occupation: Retired   Occupation: retired  Tobacco Use   Smoking status: Former    Packs/day: 1.00    Years: 30.00    Total pack years: 30.00    Types: Cigarettes    Quit date: 01/19/2014    Years since quitting: 8.4   Smokeless tobacco: Former  Scientific laboratory technician Use: Never used  Substance and Sexual Activity   Alcohol use: No    Alcohol/week: 0.0 standard drinks of alcohol   Drug use: No   Sexual activity: Not Currently  Other Topics Concern   Not on file  Social History Narrative   Not on file   Social Determinants of Health   Financial Resource Strain: Low Risk  (06/17/2022)   Overall Financial Resource Strain (CARDIA)    Difficulty of Paying Living Expenses: Not hard at all  Food Insecurity: No Food Insecurity (06/17/2022)   Hunger Vital Sign    Worried About Running Out of Food in the Last Year: Never true    [place] in the Last Year: Never true  Transportation Needs: No Transportation Needs (06/17/2022)   PRAPARE - Hydrologist (Medical): No    Lack of Transportation (Non-Medical): No  Physical Activity: Inactive (06/17/2022)    Exercise Vital Sign    Days of Exercise per Week: 0 days    Minutes of Exercise per Session: 0 min  Stress: No Stress Concern Present (06/17/2022)   [place]    Feeling of Stress : Only a little  Social Connections: Moderately Isolated (06/17/2022)   Social Connection and Isolation Panel [NHANES]    Frequency of Communication with Friends and Family: More than three times a week    Frequency of Social Gatherings with Friends and Family: Never    Attends Religious Services: Never    Marine scientist or Organizations: No    Attends Archivist Meetings: Never  Marital Status: Married    Tobacco Counseling Counseling given: Not Answered   Clinical Intake:  Pre-visit preparation completed: Yes  Pain : No/denies pain Pain Score: 0-No pain     Nutritional Risks: None Diabetes: No  How often do you need to have someone help you when you read instructions, pamphlets, or other written materials from your doctor or pharmacy?: 1 - Never  Diabetic?no  Interpreter Needed?: No  Information entered by :: Kirke Shaggy, LPN   Activities of Daily Living    06/17/2022    2:37 PM 06/11/2022    3:40 PM  In your present state of health, do you have any difficulty performing the following activities:  Hearing? 0 0  Vision? 0 0  Difficulty concentrating or making decisions? 0 0  Walking or climbing stairs? 1 1  Dressing or bathing? 0 0  Doing errands, shopping? 0 0  Preparing Food and eating ? N   Using the Toilet? N   In the past six months, have you accidently leaked urine? N   Do you have problems with loss of bowel control? N   Managing your Medications? N   Managing your Finances? N   Housekeeping or managing your Housekeeping? N     Patient Care Team: Olin Hauser, DO as PCP - General (Family Medicine) Minna Merritts, MD as Consulting Physician (Cardiology)  Indicate any recent  Medical Services you may have received from other than [facility] providers in the past year (date may be approximate).     Assessment:   This is a routine wellness examination for [facility].  Hearing/Vision screen Hearing Screening - Comments:: No aids Vision Screening - Comments:: Readers- [employer]  Dietary issues and exercise activities discussed: Current Exercise Habits: The patient does not participate in regular exercise at present   Goals Addressed             This Visit's Progress    DIET - INCREASE WATER INTAKE         Depression Screen    06/17/2022    2:33 PM 06/11/2022    3:28 PM 06/04/2021   10:17 AM 06/04/2021    9:58 AM 02/17/2021    2:58 PM 09/23/2020    1:47 PM 08/27/2020    2:56 PM  PHQ 2/9 Scores  PHQ - 2 Score 1 2 0 0 0 0 0  PHQ- 9 Score '2 5 1 1 1 1 '$ 0    Fall Risk    06/17/2022    2:36 PM 06/11/2022    3:40 PM 06/04/2021   10:00 AM 09/23/2020    1:47 PM 08/27/2020    2:56 PM  Fall Risk   Falls in the past year? 0 0 0 0 0  Number falls in past yr: 0 0 0 0 0  Injury with Fall? 0 0 0 0 0  Risk for fall due to : No Fall Risks No Fall Risks No Fall Risks    Follow up Falls prevention discussed;Falls evaluation completed Falls evaluation completed Falls evaluation completed Falls evaluation completed Falls evaluation completed    FALL RISK PREVENTION PERTAINING TO THE HOME:  Any stairs in or around the home? Yes  If so, are there any without handrails? No  Home free of loose throw rugs in walkways, pet beds, electrical cords, etc? Yes  Adequate lighting in your home to reduce risk of falls? Yes   ASSISTIVE DEVICES UTILIZED TO PREVENT FALLS:  Life alert? No  Use of a cane,  walker or w/c? No  Grab bars in the bathroom? No  Shower chair or bench in shower? No  Elevated toilet seat or a handicapped toilet? No   Cognitive Function:        06/17/2022    2:41 PM 06/04/2021   10:27 AM  6CIT Screen  What Year? 0 points 0 points  What month? 0 points 0 points   What time? 0 points 0 points  Count back from 20 0 points 0 points  Months in reverse 2 points 0 points  Repeat phrase 0 points 0 points  Total Score 2 points 0 points    Immunizations Immunization History  Administered Date(s) Administered   Fluad Quad(high Dose 65+) 01/29/2019, 01/30/2020, 02/17/2021, 04/27/2022   Influenza, High Dose Seasonal PF 01/18/2014, 02/01/2018   Influenza,inj,Quad PF,6+ Mos 02/06/2013   Influenza-Unspecified 01/18/2014   Pneumococcal Conjugate-13 01/02/2014   Pneumococcal Polysaccharide-23 02/01/2018   Tdap 04/13/2011    TDAP status: Due, Education has been provided regarding the importance of this vaccine. Advised may receive this vaccine at local pharmacy or Health Dept. Aware to provide a copy of the vaccination record if obtained from local pharmacy or Health Dept. Verbalized acceptance and understanding.  Flu Vaccine status: Up to date  Pneumococcal vaccine status: Up to date  Covid-19 vaccine status: Declined, Education has been provided regarding the importance of this vaccine but patient still declined. Advised may receive this vaccine at local pharmacy or Health Dept.or vaccine clinic. Aware to provide a copy of the vaccination record if obtained from local pharmacy or Health Dept. Verbalized acceptance and understanding.  Qualifies for Shingles Vaccine? Yes   Zostavax completed No   Shingrix Completed?: No.    Education has been provided regarding the importance of this vaccine. Patient has been advised to call insurance company to determine out of pocket expense if they have not yet received this vaccine. Advised may also receive vaccine at local pharmacy or Health Dept. Verbalized acceptance and understanding.  Screening Tests Health Maintenance  Topic Date Due   COVID-19 Vaccine (1) Never done   Zoster Vaccines- Shingrix (1 of 2) Never done   Lung Cancer Screening  01/19/2015   DTaP/Tdap/Td (2 - Td or Tdap) 04/12/2021   Medicare Annual  Wellness (AWV)  06/17/2023   COLONOSCOPY (Pts 45-17yr Insurance coverage will need to be confirmed)  06/08/2027   Pneumonia Vaccine 77 Years old  Completed   INFLUENZA VACCINE  Completed   DEXA SCAN  Completed   Hepatitis C Screening  Completed   HPV VACCINES  Aged Out    Health Maintenance  Health Maintenance Due  Topic Date Due   COVID-19 Vaccine (1) Never done   Zoster Vaccines- Shingrix (1 of 2) Never done   Lung Cancer Screening  01/19/2015   DTaP/Tdap/Td (2 - Td or Tdap) 04/12/2021    Colorectal cancer screening: No longer required.   Mammogram status: No longer required due to age.  Bone Density status: Completed 03/18/20. Results reflect: Bone density results: OSTEOPENIA. Repeat every 5 years.  Lung Cancer Screening: (Low Dose CT Chest recommended if Age 44-80years, 30 pack-year currently smoking OR have quit w/in 15years.) does not qualify.    Additional Screening:  Hepatitis C Screening: does qualify; Completed 01/17/15  Vision Screening: Recommended annual ophthalmology exams for early detection of glaucoma and other disorders of the eye. Is the patient up to date with their annual eye exam?  Yes  Who is the provider or what is the name  of the office in which the patient attends annual eye exams? Dr.Ritter If pt is not established with a provider, would they like to be referred to a provider to establish care? No .   Dental Screening: Recommended annual dental exams for proper oral hygiene  Community Resource Referral / Chronic Care Management: CRR required this visit?  No   CCM required this visit?  No      Plan:     I have personally reviewed and noted the following in the patient's chart:   Medical and social history Use of alcohol, tobacco or illicit drugs  Current medications and supplements including opioid prescriptions. Patient is not currently taking opioid prescriptions. Functional ability and status Nutritional status Physical  activity Advanced directives List of other physicians Hospitalizations, surgeries, and ER visits in previous 12 months Vitals Screenings to include cognitive, depression, and falls Referrals and appointments  In addition, I have reviewed and discussed with patient certain preventive protocols, quality metrics, and best practice recommendations. A written personalized care plan for preventive services as well as general preventive health recommendations were provided to patient.     Dionisio David, LPN   [phone]   Nurse Notes: none

## 2022-06-17 NOTE — Patient Instructions (Signed)
Kelly Weber , Thank you for taking time to come for your Medicare Wellness Visit. I appreciate your ongoing commitment to your health goals. Please review the following plan we discussed and let me know if I can assist you in the future.   These are the goals we discussed:  Goals      DIET - EAT MORE FRUITS AND VEGETABLES     DIET - INCREASE WATER INTAKE        This is a list of the screening recommended for you and due dates:  Health Maintenance  Topic Date Due   COVID-19 Vaccine (1) Never done   Zoster (Shingles) Vaccine (1 of 2) Never done   Screening for Lung Cancer  01/19/2015   DTaP/Tdap/Td vaccine (2 - Td or Tdap) 04/12/2021   Medicare Annual Wellness Visit  06/17/2023   Colon Cancer Screening  06/08/2027   Pneumonia Vaccine  Completed   Flu Shot  Completed   DEXA scan (bone density measurement)  Completed   Hepatitis C Screening: USPSTF Recommendation to screen - Ages 44-79 yo.  Completed   HPV Vaccine  Aged Out    Advanced directives: no  Conditions/risks identified: none  Next appointment: Follow up in one year for your annual wellness visit 06/23/23 @ 2:00 pm by phone   Preventive Care 65 Years and Older, Female Preventive care refers to lifestyle choices and visits with your health care provider that can promote health and wellness. What does preventive care include? A yearly physical exam. This is also called an annual well check. Dental exams once or twice a year. Routine eye exams. Ask your health care provider how often you should have your eyes checked. Personal lifestyle choices, including: Daily care of your teeth and gums. Regular physical activity. Eating a healthy diet. Avoiding tobacco and drug use. Limiting alcohol use. Practicing safe sex. Taking low-dose aspirin every day. Taking vitamin and mineral supplements as recommended by your health care provider. What happens during an annual well check? The services and screenings done by your  health care provider during your annual well check will depend on your age, overall health, lifestyle risk factors, and family history of disease. Counseling  Your health care provider may ask you questions about your: Alcohol use. Tobacco use. Drug use. Emotional well-being. Home and relationship well-being. Sexual activity. Eating habits. History of falls. Memory and ability to understand (cognition). Work and work Statistician. Reproductive health. Screening  You may have the following tests or measurements: Height, weight, and BMI. Blood pressure. Lipid and cholesterol levels. These may be checked every 5 years, or more frequently if you are over 78 years old. Skin check. Lung cancer screening. You may have this screening every year starting at age 55 if you have a 30-pack-year history of smoking and currently smoke or have quit within the past 15 years. Fecal occult blood test (FOBT) of the stool. You may have this test every year starting at age 71. Flexible sigmoidoscopy or colonoscopy. You may have a sigmoidoscopy every 5 years or a colonoscopy every 10 years starting at age 57. Hepatitis C blood test. Hepatitis B blood test. Sexually transmitted disease (STD) testing. Diabetes screening. This is done by checking your blood sugar (glucose) after you have not eaten for a while (fasting). You may have this done every 1-3 years. Bone density scan. This is done to screen for osteoporosis. You may have this done starting at age 83. Mammogram. This may be done every 1-2 years. Talk  to your health care provider about how often you should have regular mammograms. Talk with your health care provider about your test results, treatment options, and if necessary, the need for more tests. Vaccines  Your health care provider may recommend certain vaccines, such as: Influenza vaccine. This is recommended every year. Tetanus, diphtheria, and acellular pertussis (Tdap, Td) vaccine. You may  need a Td booster every 10 years. Zoster vaccine. You may need this after age 65. Pneumococcal 13-valent conjugate (PCV13) vaccine. One dose is recommended after age 88. Pneumococcal polysaccharide (PPSV23) vaccine. One dose is recommended after age 50. Talk to your health care provider about which screenings and vaccines you need and how often you need them. This information is not intended to replace advice given to you by your health care provider. Make sure you discuss any questions you have with your health care provider. Document Released: 04/25/2015 Document Revised: 12/17/2015 Document Reviewed: 01/28/2015 Elsevier Interactive Patient Education  2017 [place] Prevention in the Home Falls can cause injuries. They can happen to people of all ages. There are many things you can do to make your home safe and to help prevent falls. What can I do on the outside of my home? Regularly fix the edges of walkways and driveways and fix any cracks. Remove anything that might make you trip as you walk through a door, such as a raised step or threshold. Trim any bushes or trees on the path to your home. Use bright outdoor lighting. Clear any walking paths of anything that might make someone trip, such as rocks or tools. Regularly check to see if handrails are loose or broken. Make sure that both sides of any steps have handrails. Any raised decks and porches should have guardrails on the edges. Have any leaves, snow, or ice cleared regularly. Use sand or salt on walking paths during winter. Clean up any spills in your garage right away. This includes oil or grease spills. What can I do in the bathroom? Use night lights. Install grab bars by the toilet and in the tub and shower. Do not use towel bars as grab bars. Use non-skid mats or decals in the tub or shower. If you need to sit down in the shower, use a plastic, non-slip stool. Keep the floor dry. Clean up any water that spills on  the floor as soon as it happens. Remove soap buildup in the tub or shower regularly. Attach bath mats securely with double-sided non-slip rug tape. Do not have throw rugs and other things on the floor that can make you trip. What can I do in the bedroom? Use night lights. Make sure that you have a light by your bed that is easy to reach. Do not use any sheets or blankets that are too big for your bed. They should not hang down onto the floor. Have a firm chair that has side arms. You can use this for support while you get dressed. Do not have throw rugs and other things on the floor that can make you trip. What can I do in the kitchen? Clean up any spills right away. Avoid walking on wet floors. Keep items that you use a lot in easy-to-reach places. If you need to reach something above you, use a strong step stool that has a grab bar. Keep electrical cords out of the way. Do not use floor polish or wax that makes floors slippery. If you must use wax, use non-skid floor wax. Do  not have throw rugs and other things on the floor that can make you trip. What can I do with my stairs? Do not leave any items on the stairs. Make sure that there are handrails on both sides of the stairs and use them. Fix handrails that are broken or loose. Make sure that handrails are as long as the stairways. Check any carpeting to make sure that it is firmly attached to the stairs. Fix any carpet that is loose or worn. Avoid having throw rugs at the top or bottom of the stairs. If you do have throw rugs, attach them to the floor with carpet tape. Make sure that you have a light switch at the top of the stairs and the bottom of the stairs. If you do not have them, ask someone to add them for you. What else can I do to help prevent falls? Wear shoes that: Do not have high heels. Have rubber bottoms. Are comfortable and fit you well. Are closed at the toe. Do not wear sandals. If you use a stepladder: Make sure  that it is fully opened. Do not climb a closed stepladder. Make sure that both sides of the stepladder are locked into place. Ask someone to hold it for you, if possible. Clearly mark and make sure that you can see: Any grab bars or handrails. First and last steps. Where the edge of each step is. Use tools that help you move around (mobility aids) if they are needed. These include: Canes. Walkers. Scooters. Crutches. Turn on the lights when you go into a dark area. Replace any light bulbs as soon as they burn out. Set up your furniture so you have a clear path. Avoid moving your furniture around. If any of your floors are uneven, fix them. If there are any pets around you, be aware of where they are. Review your medicines with your doctor. Some medicines can make you feel dizzy. This can increase your chance of falling. Ask your doctor what other things that you can do to help prevent falls. This information is not intended to replace advice given to you by your health care provider. Make sure you discuss any questions you have with your health care provider. Document Released: 01/23/2009 Document Revised: 09/04/2015 Document Reviewed: 05/03/2014 Elsevier Interactive Patient Education  2017 [employer].

## 2022-07-27 ENCOUNTER — Emergency Department
Admission: EM | Admit: 2022-07-27 | Discharge: 2022-07-28 | Disposition: A | Discharge status: Home / Self Care | Payer: Medicare HMO | Attending: Emergency Medicine | Admitting: Emergency Medicine

## 2022-07-27 ENCOUNTER — Ambulatory Visit: Payer: Self-pay

## 2022-07-27 ENCOUNTER — Emergency Department: Payer: Medicare HMO

## 2022-07-27 ENCOUNTER — Other Ambulatory Visit: Payer: Self-pay

## 2022-07-27 DIAGNOSIS — U071 COVID-19: Secondary | ICD-10-CM | POA: Insufficient documentation

## 2022-07-27 DIAGNOSIS — J441 Chronic obstructive pulmonary disease with (acute) exacerbation: Secondary | ICD-10-CM | POA: Diagnosis not present

## 2022-07-27 DIAGNOSIS — E876 Hypokalemia: Secondary | ICD-10-CM

## 2022-07-27 DIAGNOSIS — N390 Urinary tract infection, site not specified: Secondary | ICD-10-CM

## 2022-07-27 DIAGNOSIS — Z85828 Personal history of other malignant neoplasm of skin: Secondary | ICD-10-CM | POA: Diagnosis not present

## 2022-07-27 DIAGNOSIS — R0602 Shortness of breath: Secondary | ICD-10-CM | POA: Diagnosis not present

## 2022-07-27 LAB — URINALYSIS, ROUTINE W REFLEX MICROSCOPIC
Bacteria, UA: NONE SEEN
Bilirubin Urine: NEGATIVE
Glucose, UA: NEGATIVE mg/dL
Hgb urine dipstick: NEGATIVE
Ketones, ur: NEGATIVE mg/dL
Nitrite: NEGATIVE
Protein, ur: 30 mg/dL — AB
Specific Gravity, Urine: 1.027 (ref 1.005–1.030)
WBC, UA: >50 WBC/hpf (ref 0–5)
pH: 5 (ref 5.0–8.0)

## 2022-07-27 LAB — RESP PANEL BY RT-PCR (RSV, FLU A&B, COVID)  RVPGX2
Influenza A by PCR: NEGATIVE
Influenza B by PCR: NEGATIVE
Resp Syncytial Virus by PCR: NEGATIVE
SARS Coronavirus 2 by RT PCR: POSITIVE — AB

## 2022-07-27 LAB — CBC
HCT: 36.6 % (ref 36.0–46.0)
Hemoglobin: 11.2 g/dL — ABNORMAL LOW (ref 12.0–15.0)
MCH: 27 pg (ref 26.0–34.0)
MCHC: 30.6 g/dL (ref 30.0–36.0)
MCV: 88.2 fL (ref 80.0–100.0)
Platelets: 220 10*3/uL (ref 150–400)
RBC: 4.15 MIL/uL (ref 3.87–5.11)
RDW: 15.1 % (ref 11.5–15.5)
WBC: 4.2 10*3/uL (ref 4.0–10.5)
nRBC: 0 % (ref 0.0–0.2)

## 2022-07-27 LAB — BASIC METABOLIC PANEL
Anion gap: 9 (ref 5–15)
BUN: 9 mg/dL (ref 8–23)
CO2: 21 mmol/L — ABNORMAL LOW (ref 22–32)
Calcium: 8.3 mg/dL — ABNORMAL LOW (ref 8.9–10.3)
Chloride: 106 mmol/L (ref 98–111)
Creatinine, Ser: 0.9 mg/dL (ref 0.44–1.00)
GFR, Estimated: >60 mL/min (ref >60)
Glucose, Bld: 150 mg/dL — ABNORMAL HIGH (ref 70–99)
Potassium: 3.2 mmol/L — ABNORMAL LOW (ref 3.5–5.1)
Sodium: 136 mmol/L (ref 135–145)

## 2022-07-27 LAB — LACTIC ACID, PLASMA: Lactic Acid, Venous: 1.4 mmol/L (ref 0.5–1.9)

## 2022-07-27 LAB — TROPONIN I (HIGH SENSITIVITY): Troponin I (High Sensitivity): 6 ng/L (ref <18)

## 2022-07-27 MED ORDER — POTASSIUM CHLORIDE CRYS ER 20 MEQ PO TBCR
40.0000 meq | EXTENDED_RELEASE_TABLET | Freq: Once | ORAL | Status: AC
  Administered 2022-07-27: 40 meq via ORAL
  Filled 2022-07-27: qty 2

## 2022-07-27 MED ORDER — IPRATROPIUM-ALBUTEROL 0.5-2.5 (3) MG/3ML IN SOLN
3.0000 mL | Freq: Once | RESPIRATORY_TRACT | Status: AC
  Administered 2022-07-27: 3 mL via RESPIRATORY_TRACT
  Filled 2022-07-27: qty 3

## 2022-07-27 MED ORDER — SODIUM CHLORIDE 0.9 % IV SOLN
100.0000 mg | Freq: Once | INTRAVENOUS | Status: AC
  Administered 2022-07-28: 100 mg via INTRAVENOUS
  Filled 2022-07-27: qty 100

## 2022-07-27 MED ORDER — ACETAMINOPHEN 325 MG PO TABS
650.0000 mg | ORAL_TABLET | Freq: Once | ORAL | Status: AC
  Administered 2022-07-27: 650 mg via ORAL
  Filled 2022-07-27: qty 2

## 2022-07-27 MED ORDER — METHYLPREDNISOLONE SODIUM SUCC 125 MG IJ SOLR
125.0000 mg | Freq: Once | INTRAMUSCULAR | Status: AC
  Administered 2022-07-27: 125 mg via INTRAVENOUS
  Filled 2022-07-27: qty 2

## 2022-07-27 NOTE — ED Provider Triage Note (Signed)
Emergency Medicine Provider Triage Evaluation Note  Kelly Weber , a 77 y.o. female  was evaluated in triage.  Pt complains of shortness of breath and fever x 3 days. No relief with inhalers.  Physical Exam  BP 134/75   Pulse 79   Temp (!) 100.4 F (38 C)   Resp (!) 24   Wt 95.3 kg   SpO2 95%   BMI 36.05 kg/m  Gen:   Awake, no distress   Resp:  Normal effort  MSK:   Moves extremities without difficulty  Other:    Medical Decision Making  Medically screening exam initiated at 6:33 PM.  Appropriate orders placed.  [name] was informed that the remainder of the evaluation will be completed by another provider, this initial triage assessment does not replace that evaluation, and the importance of remaining in the ED until their evaluation is complete.  Febrile and tachypnea noted. Protocols and lactic ordered.   Chinita Pester, FNP 07/27/22 [phone]

## 2022-07-27 NOTE — ED Provider Notes (Signed)
[facility] Provider Note    Event Date/Time   First MD Initiated Contact with Patient 07/27/22 2334     (approximate)   History   Shortness of Breath   HPI  Kelly Weber is a 77 y.o. female with a history of COPD who presents to the ED from home with a chief complaint of shortness of breath and dry cough for the past 3 days.  Endorses subjective fevers.  Denies chest pain, abdominal pain, nausea, vomiting or dizziness.  No sick contacts.     Past Medical History   Past Medical History:  Diagnosis Date   Allergies    Anxiety    Arthritis    fingers, left knee   Depression    Diverticulitis    Dyspnea    Fatigue    GERD (gastroesophageal reflux disease)    HLD (hyperlipidemia)    Insomnia 2006   Lower back pain    Skin cancer 2007   Basal Cell CA resected from Left middle finger     Active Problem List   Patient Active Problem List   Diagnosis Date Noted   Personal history of colonic polyps 06/07/2022   Chronic bilateral low back pain without sciatica 06/13/2020   Osteoporosis of lumbar spine 01/30/2020   Atherosclerosis of aorta 10/24/2019   Vitamin D deficiency 11/23/2018   Elevated hemoglobin A1c 01/29/2018   Hyperlipidemia 09/02/2016   GERD (gastroesophageal reflux disease) 09/01/2016   Chronic diarrhea 02/09/2016   Anemia 09/29/2015   Centrilobular emphysema 08/27/2015   Syncope and collapse 08/26/2015   SOB (shortness of breath) on exertion 08/26/2015   Pityriasis rosea 03/12/2015   Seasonal allergies 02/27/2015   Hx of colonic polyps    Benign neoplasm of descending colon    Irritable bowel syndrome (IBS)    Hiatal hernia    Gonalgia 09/23/2014   Arthritis 09/23/2014   Major depression, recurrent, full remission 09/23/2014   Dysesthesia 09/23/2014   Insomnia due to medical condition 09/23/2014   Acne 09/23/2014   H/O malignant neoplasm of skin 09/23/2014   Compulsive tobacco user syndrome 09/23/2014   Hematuria,  microscopic 09/23/2014   Seborrheic keratoses, inflamed 09/23/2014     Past Surgical History   Past Surgical History:  Procedure Laterality Date   BREAST CYST ASPIRATION Bilateral    30 years ago   CATARACT EXTRACTION W/ INTRAOCULAR LENS IMPLANT Bilateral 2023   [place]   COLONOSCOPY WITH PROPOFOL N/A 11/20/2014   Procedure: COLONOSCOPY WITH PROPOFOL;  Surgeon: Midge Minium, MD;  Location: [facility];  Service: Endoscopy;  Laterality: N/A;   COLONOSCOPY WITH PROPOFOL N/A 06/07/2022   Procedure: COLONOSCOPY WITH PROPOFOL;  Surgeon: Midge Minium, MD;  Location: [facility];  Service: Endoscopy;  Laterality: N/A;   ESOPHAGOGASTRODUODENOSCOPY (EGD) WITH PROPOFOL N/A 11/20/2014   Procedure: ESOPHAGOGASTRODUODENOSCOPY (EGD) WITH PROPOFOL;  Surgeon: Midge Minium, MD;  Location: [facility];  Service: Endoscopy;  Laterality: N/A;   POLYPECTOMY  11/20/2014   Procedure: POLYPECTOMY;  Surgeon: Midge Minium, MD;  Location: [facility];  Service: Endoscopy;;   SKIN CANCER EXCISION Left    finger   TUBAL LIGATION  1970     Home Medications   Prior to Admission medications   Medication Sig Start Date End Date Taking? Authorizing Provider  albuterol (PROVENTIL) (2.5 MG/3ML) 0.083% nebulizer solution Take 3 mLs (2.5 mg total) by nebulization every 4 (four) hours as needed for wheezing or shortness of breath. 07/28/22  Yes Irean Hong,  MD  chlorpheniramine-HYDROcodone (TUSSIONEX) 10-8 MG/5ML Take 5 mLs by mouth every 12 (twelve) hours as needed for cough. 07/28/22  Yes Irean Hong, MD  doxycycline (VIBRAMYCIN) 50 MG capsule Take 2 capsules (100 mg total) by mouth 2 (two) times daily. 07/28/22  Yes Irean Hong, MD  Nebulizers (COMPRESSOR/NEBULIZER) MISC 1 Units by Does not apply route every 4 (four) hours as needed. 07/28/22  Yes Irean Hong, MD  predniSONE (DELTASONE) 20 MG tablet 3 tablets daily x 4 days 07/28/22  Yes Irean Hong, MD  acetic acid-hydrocortisone  (VOSOL-HC) OTIC solution Place 3 drops into the right ear 3 (three) times daily. Patient not taking: Reported on 06/04/2021 02/17/21   Smitty Cords, DO  albuterol (VENTOLIN HFA) 108 (90 Base) MCG/ACT inhaler Inhale 2 puffs into the lungs every 6 (six) hours as needed for wheezing or shortness of breath. 12/27/21   Tommi Rumps, PA-C  azelastine (ASTELIN) 0.1 % nasal spray Place 2 sprays into both nostrils 2 (two) times daily. Use in each nostril as directed 04/27/22   Karamalegos, Netta Neat, DO  baclofen (LIORESAL) 10 MG tablet Take 0.5-1 tablets (5-10 mg total) by mouth 3 (three) times daily as needed for muscle spasms. Patient not taking: Reported on 06/17/2022 06/13/20   Smitty Cords, DO  BREZTRI AEROSPHERE 160-9-4.8 MCG/ACT AERO Inhale 2 puffs into the lungs 2 (two) times daily. 06/11/22   Karamalegos, Netta Neat, DO  Calcium Carbonate-Vit D-Min (CALCIUM 1200 PO) Take by mouth daily.    [provider]  Ciclopirox 1 % shampoo Apply 10 mLs topically 2 (two) times a week. To scalp with wet hair. For total 4 weeks; allow a minimum of 3 days between applications 04/29/22   Karamalegos, Alexander J, DO  CLENPIQ 10-3.5-12 MG-GM -GM/175ML SOLN Take by mouth. Patient not taking: Reported on 06/17/2022 04/26/22   [provider]  Multiple Vitamins-Minerals (ALIVE WOMENS ENERGY PO) Take by mouth daily.    [provider]  Multiple Vitamins-Minerals (ZINC PO) Take by mouth daily.    [provider]  Skin Protectants, Misc. (EUCERIN) cream Apply topically as needed for dry skin.    [provider]  TURMERIC PO Take by mouth daily.    [provider]  valACYclovir (VALTREX) 1000 MG tablet Take 2,000 mg by mouth 2 (two) times daily as needed (fever blisters / rash).     [provider]  venlafaxine XR (EFFEXOR-XR) 37.5 MG 24 hr capsule TAKE ONE CAPSULE BY MOUTH EVERY MORNING WITH BREAKFAST 06/15/22   Althea Charon, Netta Neat, DO      Allergies  Penicillins   Family History   Family History  Problem Relation Age of Onset   Brain cancer Mother    Thyroid disease Mother    Hypertension Mother    Breast cancer Maternal Aunt 17   Breast cancer Cousin 43   Breast cancer Maternal Aunt      Physical Exam  Triage Vital Signs: ED Triage Vitals  Enc Vitals Group     BP 07/27/22 1830 134/75     Pulse Rate 07/27/22 1830 79     Resp 07/27/22 1829 (!) 24     Temp 07/27/22 1829 (!) 100.4 F (38 C)     Temp Source 07/27/22 2307 Oral     SpO2 07/27/22 1829 95 %     Weight 07/27/22 1830 210 lb (95.3 kg)     Height 07/27/22 2318 5\' 4"  (1.626 m)     Head  Circumference --      Peak Flow --      Pain Score 07/27/22 1829 0     Pain Loc --      Pain Edu? --      Excl. in GC? --     Updated Vital Signs: BP 125/73   Pulse 77   Temp 97.8 F (36.6 C) (Oral)   Resp 13   Ht  (1.626 m)   Wt 95.3 kg   SpO2 95%   BMI 36.05 kg/m    General: Awake, mild distress.  CV:  RRR.  Good peripheral perfusion.  Resp:  Normal effort.  Diminished, otherwise CTAB. Abd:  Nontender.  No truncal vesicles.  No distention.  Other:  Bilateral calves are nontender and supple.   ED Results / Procedures / Treatments  Labs (all labs ordered are listed, but only abnormal results are displayed) Labs Reviewed  RESP PANEL BY RT-PCR (RSV, FLU A&B, COVID)  RVPGX2 - Abnormal; Notable for the following components:      Result Value   SARS Coronavirus 2 by RT PCR POSITIVE (*)    All other components within normal limits  CBC - Abnormal; Notable for the following components:   Hemoglobin 11.2 (*)    All other components within normal limits  BASIC METABOLIC PANEL - Abnormal; Notable for the following components:   Potassium 3.2 (*)    CO2 21 (*)    Glucose, Bld 150 (*)    Calcium 8.3 (*)    All other components within normal limits  URINALYSIS, ROUTINE W REFLEX MICROSCOPIC - Abnormal; Notable for the following components:    Color, Urine YELLOW (*)    APPearance HAZY (*)    Protein, ur 30 (*)    Leukocytes,Ua LARGE (*)    All other components within normal limits  URINE CULTURE  LACTIC ACID, PLASMA  TROPONIN I (HIGH SENSITIVITY)     EKG  ED ECG REPORT I, [place] J, the attending physician, personally viewed and interpreted this ECG.   Date: 07/28/2022  EKG Time: 1833  Rate: 81  Rhythm: normal sinus rhythm  Axis: Normal  Intervals:none  ST&T Change: Nonspecific    RADIOLOGY I have independently visualized and interpreted patient's x-ray as well as noted the radiology interpretation:  Chest x-ray: Chronic appearing increased interstitial lung markings  Official radiology report(s): DG Chest 2 View  Result Date: 07/27/2022 CLINICAL DATA:  Shortness of breath. EXAM: CHEST - 2 VIEW COMPARISON:  June 07, 2022 FINDINGS: The heart size and mediastinal contours are within normal limits. The lungs are hyperinflated. Mild, diffuse, chronic appearing increased interstitial lung markings are noted. There is no evidence of focal consolidation, pleural effusion or pneumothorax. Multilevel degenerative changes are noted throughout the thoracic spine. IMPRESSION: Chronic appearing increased interstitial lung markings without evidence of acute or active cardiopulmonary disease. Electronically Signed   By: Aram Candela M.D.   On: 07/27/2022 19:11     PROCEDURES:  Critical Care performed: No  .1-3 Lead EKG Interpretation  Performed by: Irean Hong, MD Authorized by: Irean Hong, MD     Interpretation: normal     ECG rate:  80   ECG rate assessment: normal     Rhythm: sinus rhythm     Ectopy: none     Conduction: normal   Comments:     Patient placed on cardiac monitor to evaluate for arrhythmias    MEDICATIONS ORDERED IN ED: Medications  acetaminophen (TYLENOL) tablet 650 mg (  650 mg Oral Given 07/27/22 1837)  potassium chloride SA (KLOR-CON M) CR tablet 40 mEq (40 mEq Oral Given 07/27/22  2352)  methylPREDNISolone sodium succinate (SOLU-MEDROL) 125 mg/2 mL injection 125 mg (125 mg Intravenous Given 07/27/22 2351)  ipratropium-albuterol (DUONEB) 0.5-2.5 (3) MG/3ML nebulizer solution 3 mL (3 mLs Nebulization Given 07/27/22 2352)  doxycycline (VIBRAMYCIN) 100 mg in sodium chloride 0.9 % 250 mL IVPB (0 mg Intravenous Stopped 07/28/22 0220)     IMPRESSION / MDM / ASSESSMENT AND PLAN / ED COURSE  I reviewed the triage vital signs and the nursing notes.                             77 year old female presenting with shortness of breath. Differential includes, but is not limited to, viral syndrome, bronchitis including COPD exacerbation, pneumonia, reactive airway disease including asthma, CHF including exacerbation with or without pulmonary/interstitial edema, pneumothorax, ACS, thoracic trauma, and pulmonary embolism.  I personally reviewed patient's records and note her PCP office visit on 06/11/2022 for COPD.  Patient's presentation is most consistent with acute presentation with potential threat to life or bodily function.  The patient is on the cardiac monitor to evaluate for evidence of arrhythmia and/or significant heart rate changes.  Laboratory results demonstrate normal WBC 4.2, mild hypokalemia with potassium 3.2, troponin is negative.  Lactic acid negative.  She is positive for COVID-19 and has large leukocytes with greater than 50 WBC in her urine.  Patient was never vaccinated against COVID-19 and is not interested in taking Paxlovid.  Will initiate IV Solu-Medrol, DuoNeb, oral potassium.  Administer IV doxycycline to cover both UTI and potential developing lung infection.  Patient currently afebrile after Tylenol administration in triage.  Will reassess.  Clinical Course as of 07/28/22 0445  Wed Jul 28, 2022  0115 Patient feeling much better.  Aeration improved.  Doxycycline still infusing.  Will continue to monitor and care for patient and reassess. [JS]  0224 IV antibiotic  completed.  Patient is feeling significantly better and eager for discharge home.  Will continue doxycycline x 1 week, prednisone burst and prescribe as needed albuterol nebulizer.  Strict return precautions given.  Patient and spouse verbalized understanding and agree with plan of care. [JS]    Clinical Course User Index [JS] Irean Hong, MD     FINAL CLINICAL IMPRESSION(S) / ED DIAGNOSES   Final diagnoses:  COPD exacerbation  COVID-19  Urinary tract infection without hematuria, site unspecified  Hypokalemia     Rx / DC Orders   ED Discharge Orders          Ordered    doxycycline (VIBRAMYCIN) 50 MG capsule  2 times daily        07/28/22 0225    predniSONE (DELTASONE) 20 MG tablet        07/28/22 0225    chlorpheniramine-HYDROcodone (TUSSIONEX) 10-8 MG/5ML  Every 12 hours PRN        07/28/22 0225    albuterol (PROVENTIL) (2.5 MG/3ML) 0.083% nebulizer solution  Every 4 hours PRN        07/28/22 0226    Nebulizers (COMPRESSOR/NEBULIZER) MISC  Every 4 hours PRN        07/28/22 0226             Note:  This document was prepared using Dragon voice recognition software and may include unintentional dictation errors.   Irean Hong, MD 07/28/22 [phone]

## 2022-07-27 NOTE — ED Triage Notes (Addendum)
Pt reports shob past 3 days. Hx emphysema. +fevers.  Labored breathing noted, unable to speak in complete sentences. Last tylenol at 1200.

## 2022-07-27 NOTE — Telephone Encounter (Addendum)
     Chief Complaint: Cough with yellow-green mucus, fever, COPD Symptoms: Above Frequency: 3 days ago Pertinent Negatives: Patient denies  Disposition: ED /[x] Urgent Care (no appt availability in office) / Appointment(In office/virtual)/  [facility]/ Home Care/ Refused Recommended Disposition /[] [facility]/  Follow-up with PCP Additional Notes: No availability in the practice per Fleet Contras. Reason for Disposition  [1] MILD difficulty breathing (e.g., minimal/no SOB at rest, SOB with walking, pulse <100) AND [2] still present when not coughing  Answer Assessment - Initial Assessment Questions 1. ONSET: "When did the cough begin?"      3 days ago 2. SEVERITY: "How bad is the cough today?"      Severe 3. SPUTUM: "Describe the color of your sputum" (none, dry cough; clear, white, yellow, green)     Yellow green 4. HEMOPTYSIS: "Are you coughing up any blood?" If so ask: "How much?" (flecks, streaks, tablespoons, etc.)     No 5. DIFFICULTY BREATHING: "Are you having difficulty breathing?" If Yes, ask: "How bad is it?" (e.g., mild, moderate, severe)    - MILD: No SOB at rest, mild SOB with walking, speaks normally in sentences, can lie down, no retractions, pulse < 100.    - MODERATE: SOB at rest, SOB with minimal exertion and prefers to sit, cannot lie down flat, speaks in phrases, mild retractions, audible wheezing, pulse 100-120.    - SEVERE: Very SOB at rest, speaks in single words, struggling to breathe, sitting hunched forward, retractions, pulse > 120      Mild-moderate 6. FEVER: "Do you have a fever?" If Yes, ask: "What is your temperature, how was it measured, and when did it start?"     100 7. CARDIAC HISTORY: "Do you have any history of heart disease?" (e.g., heart attack, congestive heart failure)      No 8. LUNG HISTORY: "Do you have any history of lung disease?"  (e.g., pulmonary embolus, asthma, emphysema)     COPD 9. PE RISK FACTORS: "Do  you have a history of blood clots?" (or: recent major surgery, recent prolonged travel, bedridden)     No 10. OTHER SYMPTOMS: "Do you have any other symptoms?" (e.g., runny nose, wheezing, chest pain)       SOB 11. PREGNANCY: "Is there any chance you are pregnant?" "When was your last menstrual period?"       no 12. TRAVEL: "Have you traveled out of the country in the last month?" (e.g., travel history, exposures)       No  Protocols used: Cough - Acute Productive-A-AH

## 2022-07-28 MED ORDER — ALBUTEROL SULFATE (2.5 MG/3ML) 0.083% IN NEBU: 2.5000 mg | INHALATION_SOLUTION | RESPIRATORY_TRACT | 0 refills | Status: DC | PRN

## 2022-07-28 MED ORDER — COMPRESSOR/NEBULIZER MISC: 1.0000 [IU] | 0 refills | Status: DC | PRN

## 2022-07-28 MED ORDER — HYDROCOD POLI-CHLORPHE POLI ER 10-8 MG/5ML PO SUER: 5.0000 mL | Freq: Two times a day (BID) | ORAL | 0 refills | Status: DC | PRN

## 2022-07-28 MED ORDER — DOXYCYCLINE HYCLATE 50 MG PO CAPS: 100.0000 mg | ORAL_CAPSULE | Freq: Two times a day (BID) | ORAL | 0 refills | Status: DC

## 2022-07-28 MED ORDER — PREDNISONE 20 MG PO TABS: ORAL_TABLET | ORAL | 0 refills | Status: DC

## 2022-07-28 NOTE — Discharge Instructions (Signed)
1.  Continue to finish antibiotic as prescribed (Doxycycline  twice daily x 7 days). 2.  Take and finish steroid as prescribed (Prednisone  daily x 4 days). 3.  You may use Albuterol nebulizer every 4 hours as needed for difficulty breathing. 4.  You may take Tussionex as needed for cough. 5.  Return to the ER for worsening symptoms, persistent vomiting, difficulty breathing or other concerns.

## 2022-07-29 LAB — URINE CULTURE

## 2022-08-02 ENCOUNTER — Telehealth: Payer: Self-pay | Admitting: *Deleted

## 2022-08-02 NOTE — Telephone Encounter (Signed)
.      Patient  visit on 07/28/2022  at [facility] was for treatment  Have you been able to follow up with your primary care physician? Was feeling better now she feels like she has the flu but does not want to see her PCP they wont see me if I'm sick  The patient was able to obtain any needed medicine or equipment.  Are there diet recommendations that you are having difficulty following?  Patient expresses understanding of discharge instructions and education provided has no other needs at this time.  Yes   Alois Cliche -Berneda [facility] [place], Population Health [phone] [address] [place] , [place] [address] Email : [email or web address]

## 2022-08-05 DIAGNOSIS — L821 Other seborrheic keratosis: Secondary | ICD-10-CM | POA: Diagnosis not present

## 2022-08-05 DIAGNOSIS — D2261 Melanocytic nevi of right upper limb, including shoulder: Secondary | ICD-10-CM | POA: Diagnosis not present

## 2022-08-05 DIAGNOSIS — Z85828 Personal history of other malignant neoplasm of skin: Secondary | ICD-10-CM | POA: Diagnosis not present

## 2022-08-05 DIAGNOSIS — L650 Telogen effluvium: Secondary | ICD-10-CM | POA: Diagnosis not present

## 2022-08-05 DIAGNOSIS — L538 Other specified erythematous conditions: Secondary | ICD-10-CM | POA: Diagnosis not present

## 2022-08-05 DIAGNOSIS — D2271 Melanocytic nevi of right lower limb, including hip: Secondary | ICD-10-CM | POA: Diagnosis not present

## 2022-08-05 DIAGNOSIS — L820 Inflamed seborrheic keratosis: Secondary | ICD-10-CM | POA: Diagnosis not present

## 2022-08-05 DIAGNOSIS — D2262 Melanocytic nevi of left upper limb, including shoulder: Secondary | ICD-10-CM | POA: Diagnosis not present

## 2022-08-23 ENCOUNTER — Ambulatory Visit: Payer: Medicare HMO | Admitting: Dermatology

## 2022-08-26 ENCOUNTER — Encounter: Payer: Self-pay | Admitting: Emergency Medicine

## 2022-08-26 ENCOUNTER — Emergency Department: Payer: Medicare HMO

## 2022-08-26 ENCOUNTER — Other Ambulatory Visit: Payer: Self-pay

## 2022-08-26 ENCOUNTER — Emergency Department
Admission: EM | Admit: 2022-08-26 | Discharge: 2022-08-26 | Disposition: A | Discharge status: Home / Self Care | Payer: Medicare HMO | Attending: Emergency Medicine | Admitting: Emergency Medicine

## 2022-08-26 DIAGNOSIS — S92902A Unspecified fracture of left foot, initial encounter for closed fracture: Secondary | ICD-10-CM | POA: Insufficient documentation

## 2022-08-26 DIAGNOSIS — M25572 Pain in left ankle and joints of left foot: Secondary | ICD-10-CM | POA: Diagnosis present

## 2022-08-26 DIAGNOSIS — J449 Chronic obstructive pulmonary disease, unspecified: Secondary | ICD-10-CM | POA: Insufficient documentation

## 2022-08-26 DIAGNOSIS — W108XXA Fall (on) (from) other stairs and steps, initial encounter: Secondary | ICD-10-CM | POA: Diagnosis not present

## 2022-08-26 DIAGNOSIS — S92812A Other fracture of left foot, initial encounter for closed fracture: Secondary | ICD-10-CM | POA: Diagnosis not present

## 2022-08-26 DIAGNOSIS — S93402A Sprain of unspecified ligament of left ankle, initial encounter: Secondary | ICD-10-CM | POA: Diagnosis not present

## 2022-08-26 DIAGNOSIS — M79673 Pain in unspecified foot: Secondary | ICD-10-CM | POA: Diagnosis not present

## 2022-08-26 DIAGNOSIS — W19XXXA Unspecified fall, initial encounter: Secondary | ICD-10-CM

## 2022-08-26 DIAGNOSIS — M7732 Calcaneal spur, left foot: Secondary | ICD-10-CM | POA: Diagnosis not present

## 2022-08-26 DIAGNOSIS — S99922A Unspecified injury of left foot, initial encounter: Secondary | ICD-10-CM | POA: Diagnosis not present

## 2022-08-26 DIAGNOSIS — S93602A Unspecified sprain of left foot, initial encounter: Secondary | ICD-10-CM | POA: Diagnosis not present

## 2022-08-26 DIAGNOSIS — R609 Edema, unspecified: Secondary | ICD-10-CM | POA: Diagnosis not present

## 2022-08-26 MED ORDER — HYDROCODONE-ACETAMINOPHEN 5-325 MG PO TABS: 1.0000 | ORAL_TABLET | Freq: Four times a day (QID) | ORAL | 0 refills | Status: DC | PRN

## 2022-08-26 MED ORDER — HYDROCODONE-ACETAMINOPHEN 5-325 MG PO TABS
1.0000 | ORAL_TABLET | Freq: Once | ORAL | Status: AC
  Administered 2022-08-26: 1 via ORAL
  Filled 2022-08-26: qty 1

## 2022-08-26 NOTE — Discharge Instructions (Addendum)
Call make an appoint with Dr. Allena Katz who is on-call for podiatry.  Ice and elevation.  Wear cam walker when up walking and use the walker for added support and protection.  A prescription for hydrocodone was sent to the pharmacy for you to take as needed for pain.  This medication could cause drowsiness and increase your risk for falling.

## 2022-08-26 NOTE — ED Triage Notes (Signed)
Patient to ED via [facility] from home after a mechanical fall going down 3 stairs. Denies hitting head or LOC. C/o left ankle pain with bruising and swelling. Ambulatory with assistance per EMS.

## 2022-08-26 NOTE — ED Provider Notes (Signed)
[facility] Provider Note    Event Date/Time   First MD Initiated Contact with Patient 08/26/22 1243     (approximate)   History   Fall   HPI  Kelly Weber is a 77 y.o. female   presents to the ED with complaint of left foot and ankle pain after a mechanical fall that occurred this morning when she tripped going out the door this morning.  She denies any head injury or loss of consciousness and the only injury she has this to her foot.  Patient has been ambulatory since the accident.  Patient has a history of COPD, fatigue, dyspnea, GERD, low back pain.      Physical Exam   Triage Vital Signs: ED Triage Vitals [08/26/22 1225]  Enc Vitals Group     BP (!) 148/72     Pulse Rate 76     Resp 18     Temp 98.3 F (36.8 C)     Temp Source Oral     SpO2 95 %     Weight      Height      Head Circumference      Peak Flow      Pain Score 10     Pain Loc      Pain Edu?      Excl. in GC?     Most recent vital signs: Vitals:   08/26/22 1225  BP: (!) 148/72  Pulse: 76  Resp: 18  Temp: 98.3 F (36.8 C)  SpO2: 95%     General: Awake, no distress.  Alert, talkative, answering questions appropriately. CV:  Good peripheral perfusion.  Heart regular rate and rhythm. Resp:  Normal effort.  Clear bilaterally. Abd:  No distention.  Other:  Examination of the left ankle and foot there is moderate amount of soft tissue edema along with ecchymosis.  Moderate tenderness on palpation of the lateral distal fibula and along the fifth metatarsal approximately.  Skin is intact.  Motor or sensory function intact.  Pulses are present.   ED Results / Procedures / Treatments   Labs (all labs ordered are listed, but only abnormal results are displayed) Labs Reviewed - No data to display   RADIOLOGY X-ray images of the left ankle was reviewed and interpreted by myself independent of the radiologist.  Moderate soft tissue edema present.  No obvious fracture.   Radiology report suggest there could possibly be a small avulsion fracture of the talar neck.    PROCEDURES:  Critical Care performed:   Procedures   MEDICATIONS ORDERED IN ED: Medications  HYDROcodone-acetaminophen (NORCO/VICODIN) 5-325 MG per tablet 1 tablet (1 tablet Oral Given 08/26/22 1345)     IMPRESSION / MDM / ASSESSMENT AND PLAN / ED COURSE  I reviewed the triage vital signs and the nursing notes.   Differential diagnosis includes, but is not limited to, left ankle sprain, fracture, this location, contusion, foot fracture, foot strain secondary to fall.  77 year old female presents to the ED after a mechanical fall when she missed a couple of steps and landed twisting her ankle.  Patient denies any other injuries.  Ankle x-ray and physical exam is consistent with a sprain.  Radiology suggest there is possibly could be a small avulsion fracture off the talar neck.  Patient was made aware that she should follow-up with Dr. Allena Katz who is on-call for podiatry.  Ice and elevation was discussed.  Patient was placed in a cam walker and also  given a walker for added support.  A prescription for hydrocodone was sent to her pharmacy to take as needed for pain.  She is aware that this could cause drowsiness and increase her risk for injury.      Patient's presentation is most consistent with acute complicated illness / injury requiring diagnostic workup.  FINAL CLINICAL IMPRESSION(S) / ED DIAGNOSES   Final diagnoses:  Closed fracture of left foot, initial encounter  Sprain of left ankle, unspecified ligament, initial encounter  Fall, initial encounter     Rx / DC Orders   ED Discharge Orders          Ordered    HYDROcodone-acetaminophen (NORCO/VICODIN) 5-325 MG tablet  Every 6 hours PRN        08/26/22 1339             Note:  This document was prepared using Dragon voice recognition software and may include unintentional dictation errors.   Tommi Rumps,  PA-C 08/26/22 1351    Pilar Jarvis, MD 08/26/22 [phone]

## 2022-08-30 ENCOUNTER — Telehealth: Payer: Self-pay | Admitting: *Deleted

## 2022-08-30 NOTE — Telephone Encounter (Signed)
Transition Care Management Unsuccessful Follow-up Telephone Call  Date of discharge and from where:  [facility] 08/26/2022  Attempts:  1st Attempt  Reason for unsuccessful TCM follow-up call:  Left voice message

## 2022-09-01 ENCOUNTER — Telehealth: Payer: Self-pay | Admitting: *Deleted

## 2022-09-01 NOTE — Telephone Encounter (Signed)
Transition Care Management Follow-up Telephone Call Date of discharge and from where: [facility] 08/26/2022 How have you been since you were released from the hospital? 7 Any questions or concerns? No  Items Reviewed: Did the pt receive and understand the discharge instructions provided? Yes  Medications obtained and verified? Yes  Other? No  Any new allergies since your discharge? No  Dietary orders reviewed? No Do you have support at home? Yes   Follow up appointments reviewed:   Specialist Hospital f/u appt confirmed? Yes  Scheduled to see on  09/02/2022 Are transportation arrangements needed? No  If their condition worsens, is the pt aware to call PCP or go to the Emergency Dept.? Yes Was the patient provided with contact information for the PCP's office or ED? Yes Was to pt encouraged to call back with questions or concerns? Yes  Alois Cliche -Berneda Rose [facility] [place], Population Health [phone] [address] [place] , [place] [address] Email : [email or web address]

## 2022-09-02 ENCOUNTER — Other Ambulatory Visit: Payer: Self-pay | Admitting: Family Medicine

## 2022-09-02 DIAGNOSIS — S93492A Sprain of other ligament of left ankle, initial encounter: Secondary | ICD-10-CM | POA: Diagnosis not present

## 2022-09-02 DIAGNOSIS — Z1231 Encounter for screening mammogram for malignant neoplasm of breast: Secondary | ICD-10-CM

## 2022-09-02 DIAGNOSIS — M79672 Pain in left foot: Secondary | ICD-10-CM | POA: Diagnosis not present

## 2022-09-02 DIAGNOSIS — S93602A Unspecified sprain of left foot, initial encounter: Secondary | ICD-10-CM | POA: Diagnosis not present

## 2022-09-07 ENCOUNTER — Ambulatory Visit: Payer: Medicare HMO | Admitting: Podiatry

## 2022-09-10 ENCOUNTER — Other Ambulatory Visit: Payer: Self-pay | Admitting: Family Medicine

## 2022-09-10 DIAGNOSIS — F3342 Major depressive disorder, recurrent, in full remission: Secondary | ICD-10-CM

## 2022-09-10 NOTE — Telephone Encounter (Signed)
Requested Prescriptions  Pending Prescriptions Disp Refills   venlafaxine XR (EFFEXOR-XR) 37.5 MG 24 hr capsule [Pharmacy Med Name: VENLAFAXINE HCL ER 37.5 MG CAP] 90 capsule 0    Sig: TAKE 1 CAPSULE BY MOUTH EVERY MORNING WITH BREAKFAST     Psychiatry: Antidepressants - SNRI - desvenlafaxine & venlafaxine Failed - 09/10/2022  6:22 AM      Failed - Last BP in normal range    BP Readings from Last 1 Encounters:  08/26/22 (!) 148/72         Failed - Lipid Panel in normal range within the last 12 months    Cholesterol, Total  Date Value Ref Range Status  05/29/2018 133 100 - 199 mg/dL Final   Cholesterol  Date Value Ref Range Status  01/23/2020 185 <200 mg/dL Final  16/01/9603 540 0 - 200 mg/dL Final   Ldl Cholesterol, Calc  Date Value Ref Range Status  02/14/2014 113 (H) 0 - 100 mg/dL Final   LDL Cholesterol (Calc)  Date Value Ref Range Status  01/23/2020 108 (H) mg/dL (calc) Final    Comment:    Reference range: <100 . Desirable range <100 mg/dL for primary prevention;   <70 mg/dL for patients with CHD or diabetic patients  with > or = 2 CHD risk factors. [name] Kitchen LDL-C is now calculated using the Martin-Hopkins  calculation, which is a validated novel method providing  better accuracy than the Friedewald equation in the  estimation of LDL-C.  Horald Pollen et al. Lenox Ahr. 9811;914(78): 2061-2068  (http://education.[employer].com/faq/FAQ164)    HDL Cholesterol  Date Value Ref Range Status  02/14/2014 55 40 - 60 mg/dL Final   HDL  Date Value Ref Range Status  01/23/2020 55 > OR = 50 mg/dL Final  29/56/2130 60 >86 mg/dL Final   Triglycerides  Date Value Ref Range Status  01/23/2020 117 <150 mg/dL Final  57/84/6962 952 0 - 200 mg/dL Final         Passed - Cr in normal range and within 360 days    Creat  Date Value Ref Range Status  01/23/2020 0.80 0.60 - 0.93 mg/dL Final    Comment:    For patients >55 years of age, the reference limit for Creatinine is  approximately 13% higher for people identified as African-American. .    Creatinine, Ser  Date Value Ref Range Status  07/27/2022 0.90 0.44 - 1.00 mg/dL Final         Passed - Completed PHQ-2 or PHQ-9 in the last 360 days      Passed - Valid encounter within last 6 months    Recent Outpatient Visits           3 months ago Centrilobular emphysema [facility])   [place] [facility] [place], Netta Neat, DO   4 months ago Seborrheic dermatitis of scalp   [place] [facility] - [place] Smitty Cords, DO   8 months ago Appointment canceled by hospital   [place] [facility] Mecum, Oswaldo Conroy, PA-C   8 months ago Viral conjunctivitis of both eyes   [place] [facility] Mecum, Oswaldo Conroy, [place]   1 year ago Right ear pain   [place] [facility] Smitty Cords, DO       Future Appointments             In 3 weeks Willeen Niece, MD [facility]

## 2022-09-21 ENCOUNTER — Ambulatory Visit
Admission: RE | Admit: 2022-09-21 | Discharge: 2022-09-21 | Disposition: A | Discharge status: Home / Self Care | Payer: Medicare HMO | Source: Ambulatory Visit | Attending: Podiatry | Admitting: Podiatry

## 2022-09-21 ENCOUNTER — Other Ambulatory Visit: Payer: Self-pay | Admitting: Podiatry

## 2022-09-21 DIAGNOSIS — M25475 Effusion, left foot: Secondary | ICD-10-CM | POA: Diagnosis not present

## 2022-09-21 DIAGNOSIS — M7989 Other specified soft tissue disorders: Secondary | ICD-10-CM | POA: Diagnosis not present

## 2022-09-21 DIAGNOSIS — R600 Localized edema: Secondary | ICD-10-CM | POA: Diagnosis not present

## 2022-09-21 DIAGNOSIS — M79672 Pain in left foot: Secondary | ICD-10-CM | POA: Diagnosis not present

## 2022-09-28 ENCOUNTER — Telehealth: Payer: Self-pay

## 2022-09-28 DIAGNOSIS — Z8781 Personal history of (healed) traumatic fracture: Secondary | ICD-10-CM

## 2022-09-28 DIAGNOSIS — M810 Age-related osteoporosis without current pathological fracture: Secondary | ICD-10-CM

## 2022-09-28 DIAGNOSIS — Z780 Asymptomatic menopausal state: Secondary | ICD-10-CM

## 2022-09-28 NOTE — Telephone Encounter (Signed)
Please notify patient:  Ordered DEXA Bone density at [place]. If she wants to proceed w./ scheduling  Call the Imaging Center below anytime to schedule your own appointment now that order has been placed.  [facility] [address] [place], [place] [address] Phone: [phone]  Saralyn Pilar, DO [facility] 09/28/2022, 4:58 PM

## 2022-09-28 NOTE — Telephone Encounter (Signed)
Copied from CRM [id number]. Topic: General - Other >> Sep 28, 2022  3:17 PM Dondra Prader A wrote: Reason for CRM: Nicholos Johns with [employer] states that the pt fractured her foot in May and is wanting to see if the pt PCP would order her a  bone density test and the insurance will cover it at 100%.  Please advise.

## 2022-09-29 NOTE — Telephone Encounter (Signed)
Spoke with patient regarding test.  Gave her the information to schedule bone density.

## 2022-10-05 ENCOUNTER — Encounter: Payer: Self-pay | Admitting: Dermatology

## 2022-10-05 ENCOUNTER — Ambulatory Visit: Payer: Medicare HMO | Admitting: Dermatology

## 2022-10-05 VITALS — BP 116/68 | HR 76

## 2022-10-05 DIAGNOSIS — L821 Other seborrheic keratosis: Secondary | ICD-10-CM

## 2022-10-05 DIAGNOSIS — L820 Inflamed seborrheic keratosis: Secondary | ICD-10-CM | POA: Diagnosis not present

## 2022-10-05 DIAGNOSIS — L814 Other melanin hyperpigmentation: Secondary | ICD-10-CM

## 2022-10-05 MED ORDER — FLUOCINONIDE 0.05 % EX SOLN: CUTANEOUS | 2 refills | Status: DC

## 2022-10-05 NOTE — Progress Notes (Signed)
   New Patient Visit   Subjective  Kelly Weber is a 77 y.o. female who presents for the following: Spot on back. Itching, burning. Irritated at times. Rubbed by clothing.   Check scalp. C/O raised areas. Itching, irritated. Has used Ciclopirox shampoo in the past. No help. Increasing in number, worsening.   The patient has spots, moles and lesions to be evaluated, some may be new or changing and the patient has concerns that these could be cancer.    The following portions of the chart were reviewed this encounter and updated as appropriate: medications, allergies, medical history  Review of Systems:  No other skin or systemic complaints except as noted in HPI or Assessment and Plan.  Objective  Well appearing patient in no apparent distress; mood and affect are within normal limits.  A focused examination was performed of the following areas: Scalp, face, back  Relevant physical exam findings are noted in the Assessment and Plan.  Occipital Scalp x4, left flank x1 (5) Erythematous keratotic or waxy stuck-on papule    Assessment & Plan   LENTIGINES Exam: scattered tan macules Due to sun exposure Treatment Plan: Benign-appearing, observe. Recommend daily broad spectrum sunscreen SPF 30+ to sun-exposed areas, reapply every 2 hours as needed.  Call for any changes   SEBORRHEIC KERATOSIS - Stuck-on, waxy, tan-brown papules and/or plaques  - Benign-appearing - Discussed benign etiology and prognosis. - Observe - Call for any changes - Recommend starting moisturizer with exfoliant (Urea, Salicylic acid, or Lactic acid) one to two times daily to help smooth rough and bumpy skin.  OTC options include Cetaphil Rough and Bumpy lotion (Urea), Eucerin Roughness Relief lotion or spot treatment cream (Urea), CeraVe SA lotion/cream for Rough and Bumpy skin (Sal Acid), Gold Bond Rough and Bumpy cream (Sal Acid), and AmLactin 12% lotion/cream (Lactic Acid).  If applying in morning, also  apply sunscreen to sun-exposed areas, since these exfoliating moisturizers can increase sensitivity to sun.  Inflamed seborrheic keratosis (5) Occipital Scalp x4, left flank x1  Symptomatic, irritating, patient would like treated.   Start Fluocinonide scalp solution 1-2 times daily to affected areas on scalp as needed for itching.   Topical steroids (such as triamcinolone, fluocinolone, fluocinonide, mometasone, clobetasol, halobetasol, betamethasone, hydrocortisone) can cause thinning and lightening of the skin if they are used for too long in the same area. Your physician has selected the right strength medicine for your problem and area affected on the body. Please use your medication only as directed by your physician to prevent side effects.    Avoid picking/scratching areas.   Destruction of lesion - Occipital Scalp x4, left flank x1  Destruction method: cryotherapy   Informed consent: discussed and consent obtained   Lesion destroyed using liquid nitrogen: Yes   Region frozen until ice ball extended beyond lesion: Yes   Outcome: patient tolerated procedure well with no complications   Post-procedure details: wound care instructions given   Additional details:  Prior to procedure, discussed risks of blister formation, small wound, skin dyspigmentation, or rare scar following cryotherapy. Recommend Vaseline ointment to treated areas while healing.      Return in about 1 year (around 10/05/2023) for TBSE.  I, Lawson Radar, CMA, am acting as scribe for Willeen Niece, MD.   Documentation: I have reviewed the above documentation for accuracy and completeness, and I agree with the above.  Willeen Niece, MD

## 2022-10-05 NOTE — Patient Instructions (Addendum)
Cryotherapy Aftercare  Wash gently with soap and water everyday.   Apply Vaseline Jelly daily until healed.    Start Fluocinonide scalp solution 1-2 times daily to affected areas on scalp as needed for itching.   Topical steroids (such as triamcinolone, fluocinolone, fluocinonide, mometasone, clobetasol, halobetasol, betamethasone, hydrocortisone) can cause thinning and lightening of the skin if they are used for too long in the same area. Your physician has selected the right strength medicine for your problem and area affected on the body. Please use your medication only as directed by your physician to prevent side effects.     For rough tag-like areas on neck/chest, body areas: Recommend starting moisturizer with exfoliant (Urea, Salicylic acid, or Lactic acid) one to two times daily to help smooth rough and bumpy skin.  OTC options include Cetaphil Rough and Bumpy lotion (Urea), Eucerin Roughness Relief lotion or spot treatment cream (Urea), CeraVe SA lotion/cream for Rough and Bumpy skin (Sal Acid), Gold Bond Rough and Bumpy cream (Sal Acid), and AmLactin 12% lotion/cream (Lactic Acid).  If applying in morning, also apply sunscreen to sun-exposed areas, since these exfoliating moisturizers can increase sensitivity to sun.

## 2022-10-07 DIAGNOSIS — S93602D Unspecified sprain of left foot, subsequent encounter: Secondary | ICD-10-CM | POA: Diagnosis not present

## 2022-10-07 DIAGNOSIS — M25475 Effusion, left foot: Secondary | ICD-10-CM | POA: Diagnosis not present

## 2022-10-08 ENCOUNTER — Other Ambulatory Visit: Payer: Self-pay | Admitting: Podiatry

## 2022-10-08 DIAGNOSIS — M25475 Effusion, left foot: Secondary | ICD-10-CM

## 2022-10-11 ENCOUNTER — Encounter: Payer: Self-pay | Admitting: Podiatry

## 2022-10-15 ENCOUNTER — Ambulatory Visit
Admission: RE | Admit: 2022-10-15 | Discharge: 2022-10-15 | Disposition: A | Discharge status: Home / Self Care | Payer: Medicare HMO | Source: Ambulatory Visit | Attending: Family Medicine | Admitting: Family Medicine

## 2022-10-15 ENCOUNTER — Ambulatory Visit
Admission: RE | Admit: 2022-10-15 | Discharge: 2022-10-15 | Disposition: A | Discharge status: Home / Self Care | Payer: Medicare HMO | Source: Ambulatory Visit | Attending: Podiatry | Admitting: Podiatry

## 2022-10-15 DIAGNOSIS — M25475 Effusion, left foot: Secondary | ICD-10-CM

## 2022-10-15 DIAGNOSIS — M25572 Pain in left ankle and joints of left foot: Secondary | ICD-10-CM | POA: Diagnosis not present

## 2022-10-15 DIAGNOSIS — Z1231 Encounter for screening mammogram for malignant neoplasm of breast: Secondary | ICD-10-CM | POA: Diagnosis not present

## 2022-10-15 DIAGNOSIS — R600 Localized edema: Secondary | ICD-10-CM | POA: Diagnosis not present

## 2022-10-15 DIAGNOSIS — M19072 Primary osteoarthritis, left ankle and foot: Secondary | ICD-10-CM | POA: Diagnosis not present

## 2022-10-19 ENCOUNTER — Ambulatory Visit
Admission: RE | Admit: 2022-10-19 | Discharge: 2022-10-19 | Disposition: A | Discharge status: Home / Self Care | Payer: Medicare HMO | Source: Ambulatory Visit | Attending: Family Medicine | Admitting: Family Medicine

## 2022-10-19 DIAGNOSIS — Z8781 Personal history of (healed) traumatic fracture: Secondary | ICD-10-CM | POA: Insufficient documentation

## 2022-10-19 DIAGNOSIS — Z780 Asymptomatic menopausal state: Secondary | ICD-10-CM | POA: Insufficient documentation

## 2022-10-19 DIAGNOSIS — M810 Age-related osteoporosis without current pathological fracture: Secondary | ICD-10-CM | POA: Diagnosis not present

## 2022-10-20 ENCOUNTER — Other Ambulatory Visit: Payer: Medicare HMO

## 2022-11-04 DIAGNOSIS — H524 Presbyopia: Secondary | ICD-10-CM | POA: Diagnosis not present

## 2022-11-25 ENCOUNTER — Other Ambulatory Visit: Payer: Medicare HMO

## 2022-12-03 ENCOUNTER — Encounter: Payer: Self-pay | Admitting: Family Medicine

## 2022-12-03 ENCOUNTER — Ambulatory Visit (INDEPENDENT_AMBULATORY_CARE_PROVIDER_SITE_OTHER): Payer: Medicare HMO | Admitting: Family Medicine

## 2022-12-03 DIAGNOSIS — F3342 Major depressive disorder, recurrent, in full remission: Secondary | ICD-10-CM

## 2022-12-03 DIAGNOSIS — I700 Atherosclerosis of aorta: Secondary | ICD-10-CM

## 2022-12-03 DIAGNOSIS — F419 Anxiety disorder, unspecified: Secondary | ICD-10-CM

## 2022-12-03 DIAGNOSIS — J432 Centrilobular emphysema: Secondary | ICD-10-CM | POA: Diagnosis not present

## 2022-12-03 DIAGNOSIS — M810 Age-related osteoporosis without current pathological fracture: Secondary | ICD-10-CM

## 2022-12-03 NOTE — Progress Notes (Signed)
Subjective:    Patient ID: Kelly Weber, female    DOB: 1945/11/07, 77 y.o.   MRN: [id number]  Kelly Weber is a 77 y.o. female presenting on 12/03/2022 for Osteoporosis   HPI  Left Foot Pain / Swelling Followed by Dr Ether Griffins [facility] L [facility]  Osteoporosis Followed by Dr Gershon Crane for Endocrinology She will return for evaluation, reconsider oral bisphosphonate,   Centrilobular Emphysema (COPD) Improved on therapy.  Right Hip Pain, limited her mobility  Aortic Atherosclerosis On imaging  BMI >37       12/03/2022    1:59 PM 06/17/2022    2:33 PM 06/11/2022    3:28 PM  Depression screen PHQ 2/9  Decreased Interest 2 0 1  Down, Depressed, Hopeless 2 1 1   PHQ - 2 Score 4 1 2   Altered sleeping 1 1 1   Tired, decreased energy 2 0 1  Change in appetite 0 0 1  Feeling bad or failure about yourself  0 0 0  Trouble concentrating 1 0 0  Moving slowly or fidgety/restless 0 0 0  Suicidal thoughts 0 0 0  PHQ-9 Score 8 2 5   Difficult doing work/chores Not difficult at all Somewhat difficult Somewhat difficult    Social History   Tobacco Use   Smoking status: Former    Current packs/day: 0.00    Average packs/day: 1 pack/day for 30.0 years (30.0 ttl pk-yrs)    Types: Cigarettes    Start date: 01/20/1984    Quit date: 01/19/2014    Years since quitting: 8.8   Smokeless tobacco: Former  Building services engineer status: Never Used  Substance Use Topics   Alcohol use: No    Alcohol/week: 0.0 standard drinks of alcohol   Drug use: No    Review of Systems Per HPI unless specifically indicated above     Objective:    BP 136/71   Pulse 80   Ht 5\' 4"  (1.626 m)   Wt 220 lb (99.8 kg)   SpO2 96%   BMI 37.76 kg/m   Wt Readings from Last 3 Encounters:  12/03/22 220 lb (99.8 kg)  08/26/22 211 lb 10.3 oz (96 kg)  07/27/22 210 lb (95.3 kg)    Physical Exam Vitals and nursing note reviewed.  Constitutional:      General: She is not in acute distress.     Appearance: Normal appearance. She is well-developed. She is not diaphoretic.     Comments: Well-appearing, comfortable, cooperative  HENT:     Head: Normocephalic and atraumatic.  Eyes:     General:        Right eye: No discharge.        Left eye: No discharge.     Conjunctiva/sclera: Conjunctivae normal.  Cardiovascular:     Rate and Rhythm: Normal rate.  Pulmonary:     Effort: Pulmonary effort is normal.  Skin:    General: Skin is warm and dry.     Findings: No erythema or rash.  Neurological:     Mental Status: She is alert and oriented to person, place, and time.  Psychiatric:        Mood and Affect: Mood normal.        Behavior: Behavior normal.        Thought Content: Thought content normal.     Comments: Well groomed, good eye contact, normal speech and thoughts     I have personally reviewed the radiology report from 10/21/22 on MRI.  MR ANKLE LEFT WO CONTRAST [[id number]] Resulted: 10/21/22 1557  Order Status: Completed Updated: 10/21/22 1559  Narrative:    CLINICAL DATA:  Left foot and ankle pain and swelling after fall 6 weeks ago  EXAM: MRI OF THE LEFT ANKLE WITHOUT CONTRAST  TECHNIQUE: Multiplanar, multisequence MR imaging of the ankle was performed. No intravenous contrast was administered.  COMPARISON:  X-ray 08/26/2022  FINDINGS: Technical Note: Despite efforts by the technologist and patient, motion artifact is present on today's exam and could not be eliminated. This reduces exam sensitivity and specificity.  TENDONS  Peroneal: Peroneus longus and brevis tendons are intact and normally positioned.  Posteromedial: Tibialis posterior, flexor hallucis longus, and flexor digitorum longus tendons are intact and normally positioned.  Anterior: Tibialis anterior, extensor hallucis longus, and extensor digitorum longus tendons are intact and normally positioned.  Achilles: Intact.  Plantar Fascia: Intact.  LIGAMENTS  Lateral: Intact  tibiofibular ligaments. The anterior and posterior talofibular ligaments are intact. Intact calcaneofibular ligament.  Medial: The deltoid and visualized portions of the spring ligament appear intact.  Dorsal calcaneocuboid ligament appears avulsed from its distal attachment (series 4, image 17).  Dorsal talonavicular ligament appears thickened.  CARTILAGE AND BONES  Ankle Joint: Chondral thinning most pronounced along the medial talar shoulder. No ankle joint effusion.  Subtalar Joints/Sinus Tarsi: Mild subtalar osteoarthritis. No significant effusion. Preservation of the anatomic fat within the sinus tarsi.  Bones: Prominent bone marrow edema within the talar head and neck and within the navicular. Tiny cortical avulsion fracture from the dorsal margin of the talar head was better seen radiographically. Focal bone marrow edema along the lateral aspect of the calcaneocuboid joint. Bony alignment is maintained without dislocation.  Other: Soft tissue edema of the ankle and hindfoot. No organized fluid collection.  IMPRESSION: 1. Motion degraded exam. 2. Prominent bone marrow edema within the talar head and neck and within the navicular. Tiny cortical avulsion fracture from the dorsal margin of the talar head was better seen radiographically. 3. Dorsal calcaneocuboid ligament appears avulsed from its distal attachment. Prominent bone marrow edema within the adjacent calcaneus and cuboid. 4. Mild tibiotalar and subtalar osteoarthritis. 5. Soft tissue edema of the ankle and hindfoot.   Electronically Signed   By: Duanne Guess D.O.   On: 10/21/2022 15:57    Results for orders placed or performed during the hospital encounter of 07/27/22  Resp panel by RT-PCR (RSV, Flu A&B, Covid) Anterior Nasal Swab   Specimen: Anterior Nasal Swab  Result Value Ref Range   SARS Coronavirus 2 by RT PCR POSITIVE (A) NEGATIVE   Influenza A by PCR NEGATIVE NEGATIVE   Influenza B by  PCR NEGATIVE NEGATIVE   Resp Syncytial Virus by PCR NEGATIVE NEGATIVE  Urine Culture   Specimen: Urine, Random  Result Value Ref Range   Specimen Description      URINE, RANDOM Performed at [facility], [address], [place], [place] [address]    Special Requests      NONE Performed at [facility], [address] [place], [place] [address]    Culture MULTIPLE SPECIES PRESENT, SUGGEST RECOLLECTION (A)    Report Status 07/29/2022 FINAL   CBC  Result Value Ref Range   WBC 4.2 4.0 - 10.5 K/uL   RBC 4.15 3.87 - 5.11 MIL/uL   Hemoglobin 11.2 (L) 12.0 - 15.0 g/dL   HCT 78.4 69.6 - 29.5 %   MCV 88.2 80.0 - 100.0 fL   MCH 27.0 26.0 - 34.0  pg   MCHC 30.6 30.0 - 36.0 g/dL   RDW 40.3 47.4 - 25.9 %   Platelets 220 150 - 400 K/uL   nRBC 0.0 0.0 - 0.2 %  Basic metabolic panel  Result Value Ref Range   Sodium 136 135 - 145 mmol/L   Potassium 3.2 (L) 3.5 - 5.1 mmol/L   Chloride 106 98 - 111 mmol/L   CO2 21 (L) 22 - 32 mmol/L   Glucose, Bld 150 (H) 70 - 99 mg/dL   BUN 9 8 - 23 mg/dL   Creatinine, Ser 5.63 0.44 - 1.00 mg/dL   Calcium 8.3 (L) 8.9 - 10.3 mg/dL   GFR, Estimated >87 >56 mL/min   Anion gap 9 5 - 15  Lactic acid, plasma  Result Value Ref Range   Lactic Acid, Venous 1.4 0.5 - 1.9 mmol/L  Urinalysis, Routine w reflex microscopic -Urine, Clean Catch  Result Value Ref Range   Color, Urine YELLOW (A) YELLOW   APPearance HAZY (A) CLEAR   Specific Gravity, Urine 1.027 1.005 - 1.030   pH 5.0 5.0 - 8.0   Glucose, UA NEGATIVE NEGATIVE mg/dL   Hgb urine dipstick NEGATIVE NEGATIVE   Bilirubin Urine NEGATIVE NEGATIVE   Ketones, ur NEGATIVE NEGATIVE mg/dL   Protein, ur 30 (A) NEGATIVE mg/dL   Nitrite NEGATIVE NEGATIVE   Leukocytes,Ua LARGE (A) NEGATIVE   RBC / HPF 6-10 0 - 5 RBC/hpf   WBC, UA >50 0 - 5 WBC/hpf   Bacteria, UA NONE SEEN NONE SEEN   Squamous Epithelial / HPF 0-5 0 - 5 /HPF   Mucus PRESENT    Hyaline Casts, UA PRESENT   Troponin I (High  Sensitivity)  Result Value Ref Range   Troponin I (High Sensitivity) 6 <18 ng/L      Assessment & Plan:   Problem List Items Addressed This Visit     Atherosclerosis of aorta (HCC)   Centrilobular emphysema (HCC)   Major depression, recurrent, full remission (HCC)   Morbid obesity (HCC) - Primary   Osteoporosis of lumbar spine   Other Visit Diagnoses     Anxiety           No change to current therapy Agree return to Endocrine to discuss OP management  COPD On Breztri  Major depression recurrent in remission On Venlafaxine  No orders of the defined types were placed in this encounter.     Follow up plan: Return if symptoms worsen or fail to improve.   Saralyn Pilar, DO [facility] 12/03/2022, 2:05 PM

## 2022-12-03 NOTE — Patient Instructions (Addendum)
Thank you for coming to the office today.    For Weight Loss / Obesity only  Contrave - oral medication, appetite suppression has wellbutrin/bupropion and naltrexone in it and it can also help with appetite, it is ordered through a speciality pharmacy. - $99 per month  Free sample 7 day, 1 pill per day for 1 week  ----  Call insurance find cost and coverage of the following - check the following: - Drug Tier, Preferred List, On Formulary - All will require a "Prior Authorization" from [place] first, before you can find out the cost - Find out if there is "Step Therapy" (other medicines required before you can try these)  Injectable weight loss meds  Wegovy (weekly) Zepbound (weekly) Saxenda (daily)  ------------------------------------  Alternative options  Semaglutide injection (mixed Ozempic) from [facility] 0.25mg  weekly for 4 weeks then increase to 0.5mg  weekly It comes in a vial and a needle syringe, you need to draw up the shot and self admin it weekly Cost is about $200 per month Call them to check pricing and availability  [facility] Address: [address] [place], [place], [place] [address] Phone: [phone]  --------------------------------  Doctors office in [place], does not accept insurance. Call to schedule / Walk in to schedule They can do the weekly weight loss injections (same as Ozempic) Pay per visit and per shot. It may be approximately $60-75+ per visit/shot, approximately $200-300 range per month depending  [facility] Address: [address] [place], [place] [address] Phone: [phone]    Please schedule a Follow-up Appointment to: Return if symptoms worsen or fail to improve.  If you have any other questions or concerns, please feel free to call the office or send a message through MyChart. You may also schedule an earlier appointment if necessary.  Additionally, you may be receiving a survey about your experience  at our office within a few days to 1 week by e-mail or mail. We value your feedback.  Saralyn Pilar, DO [facility], [place]

## 2022-12-06 ENCOUNTER — Other Ambulatory Visit: Payer: Medicare HMO

## 2022-12-07 ENCOUNTER — Other Ambulatory Visit: Payer: Self-pay | Admitting: Family Medicine

## 2022-12-07 DIAGNOSIS — F3342 Major depressive disorder, recurrent, in full remission: Secondary | ICD-10-CM

## 2022-12-08 NOTE — Telephone Encounter (Signed)
Requested medication (s) are due for refill today:   Yes  Requested medication (s) are on the active medication list:   Yes  Future visit scheduled:   No    Seen 5 days ago   Last ordered: 09/10/2022 #90, 0 refills  Returned because a lipid panel is due per protocol so unable to refill.      Requested Prescriptions  Pending Prescriptions Disp Refills   venlafaxine XR (EFFEXOR-XR) 37.5 MG 24 hr capsule [Pharmacy Med Name: VENLAFAXINE HCL ER 37.5 MG CAP] 90 capsule 0    Sig: TAKE 1 CAPSULE BY MOUTH EVERY MORNING WITH BREAKFAST     Psychiatry: Antidepressants - SNRI - desvenlafaxine & venlafaxine Failed - 12/07/2022  6:21 AM      Failed - Lipid Panel in normal range within the last 12 months    Cholesterol, Total  Date Value Ref Range Status  05/29/2018 133 100 - 199 mg/dL Final   Cholesterol  Date Value Ref Range Status  01/23/2020 185 <200 mg/dL Final  32/95/1884 166 0 - 200 mg/dL Final   Ldl Cholesterol, Calc  Date Value Ref Range Status  02/14/2014 113 (H) 0 - 100 mg/dL Final   LDL Cholesterol (Calc)  Date Value Ref Range Status  01/23/2020 108 (H) mg/dL (calc) Final    Comment:    Reference range: <100 . Desirable range <100 mg/dL for primary prevention;   <70 mg/dL for patients with CHD or diabetic patients  with > or = 2 CHD risk factors. [name] Kitchen LDL-C is now calculated using the Martin-Hopkins  calculation, which is a validated novel method providing  better accuracy than the Friedewald equation in the  estimation of LDL-C.  Horald Pollen et al. Lenox Ahr. 0630;160(10): 2061-2068  (http://education.[facility].com/faq/FAQ164)    HDL Cholesterol  Date Value Ref Range Status  02/14/2014 55 40 - 60 mg/dL Final   HDL  Date Value Ref Range Status  01/23/2020 55 > OR = 50 mg/dL Final  93/23/5573 60 >22 mg/dL Final   Triglycerides  Date Value Ref Range Status  01/23/2020 117 <150 mg/dL Final  02/54/2706 237 0 - 200 mg/dL Final         Passed - Cr in normal range  and within 360 days    Creat  Date Value Ref Range Status  01/23/2020 0.80 0.60 - 0.93 mg/dL Final    Comment:    For patients >87 years of age, the reference limit for Creatinine is approximately 13% higher for people identified as African-American. .    Creatinine, Ser  Date Value Ref Range Status  07/27/2022 0.90 0.44 - 1.00 mg/dL Final         Passed - Completed PHQ-2 or PHQ-9 in the last 360 days      Passed - Last BP in normal range    BP Readings from Last 1 Encounters:  12/03/22 136/71         Passed - Valid encounter within last 6 months    Recent Outpatient Visits           5 days ago Morbid obesity [facility])   [facility] [place], Netta Neat, DO   6 months ago Centrilobular emphysema [facility])   [facility] Smitty Cords, DO   7 months ago Seborrheic dermatitis of scalp   [facility] Smitty Cords, DO   11 months ago Appointment canceled by hospital   [facility] M[place], Oswaldo [place], PA-C   11 months ago Viral conjunctivitis of both eyes   [place] [facility] Mecum, Oswaldo [place], [place]

## 2023-01-05 ENCOUNTER — Other Ambulatory Visit: Payer: Medicare HMO

## 2023-01-06 ENCOUNTER — Other Ambulatory Visit: Payer: Medicare HMO

## 2023-01-06 DIAGNOSIS — Z0000 Encounter for general adult medical examination without abnormal findings: Secondary | ICD-10-CM | POA: Diagnosis not present

## 2023-01-06 DIAGNOSIS — R7309 Other abnormal glucose: Secondary | ICD-10-CM | POA: Diagnosis not present

## 2023-01-06 DIAGNOSIS — E7800 Pure hypercholesterolemia, unspecified: Secondary | ICD-10-CM

## 2023-01-06 DIAGNOSIS — E559 Vitamin D deficiency, unspecified: Secondary | ICD-10-CM

## 2023-01-06 LAB — CBC WITH DIFFERENTIAL/PLATELET
Absolute Monocytes: 373 cells/uL (ref 200–950)
Basophils Absolute: 32 cells/uL (ref 0–200)
Basophils Relative: 0.7 %
Eosinophils Absolute: 110 cells/uL (ref 15–500)
Eosinophils Relative: 2.4 %
HCT: 37.2 % (ref 35.0–45.0)
Hemoglobin: 11.4 g/dL — ABNORMAL LOW (ref 11.7–15.5)
Lymphs Abs: 1651 cells/uL (ref 850–3900)
MCH: 26.3 pg — ABNORMAL LOW (ref 27.0–33.0)
MCHC: 30.6 g/dL — ABNORMAL LOW (ref 32.0–36.0)
MCV: 85.7 fL (ref 80.0–100.0)
MPV: 10.4 fL (ref 7.5–12.5)
Monocytes Relative: 8.1 %
Neutro Abs: 2433 cells/uL (ref 1500–7800)
Neutrophils Relative %: 52.9 %
Platelets: 260 10*3/uL (ref 140–400)
RBC: 4.34 10*6/uL (ref 3.80–5.10)
RDW: 14.5 % (ref 11.0–15.0)
Total Lymphocyte: 35.9 %
WBC: 4.6 10*3/uL (ref 3.8–10.8)

## 2023-01-07 LAB — LIPID PANEL
Cholesterol: 194 mg/dL (ref <200)
HDL: 57 mg/dL (ref >=50)
LDL Cholesterol (Calc): 113 mg/dL — ABNORMAL HIGH
Non-HDL Cholesterol (Calc): 137 mg/dL — ABNORMAL HIGH (ref <130)
Total CHOL/HDL Ratio: 3.4 (calc) (ref <5.0)
Triglycerides: 127 mg/dL (ref <150)

## 2023-01-07 LAB — COMPREHENSIVE METABOLIC PANEL
AG Ratio: 1.9 (calc) (ref 1.0–2.5)
ALT: 9 U/L (ref 6–29)
AST: 14 U/L (ref 10–35)
Albumin: 3.8 g/dL (ref 3.6–5.1)
Alkaline phosphatase (APISO): 73 U/L (ref 37–153)
BUN: 11 mg/dL (ref 7–25)
CO2: 28 mmol/L (ref 20–32)
Calcium: 8.5 mg/dL — ABNORMAL LOW (ref 8.6–10.4)
Chloride: 106 mmol/L (ref 98–110)
Creat: 0.84 mg/dL (ref 0.60–1.00)
Globulin: 2 g/dL (ref 1.9–3.7)
Glucose, Bld: 98 mg/dL (ref 65–99)
Potassium: 4.4 mmol/L (ref 3.5–5.3)
Sodium: 141 mmol/L (ref 135–146)
Total Bilirubin: 0.5 mg/dL (ref 0.2–1.2)
Total Protein: 5.8 g/dL — ABNORMAL LOW (ref 6.1–8.1)

## 2023-01-07 LAB — VITAMIN D 25 HYDROXY (VIT D DEFICIENCY, FRACTURES): Vit D, 25-Hydroxy: 30 ng/mL (ref 30–100)

## 2023-01-07 LAB — HEMOGLOBIN A1C
Hgb A1c MFr Bld: 5.8 %{Hb} — ABNORMAL HIGH (ref <5.7)
Mean Plasma Glucose: 120 mg/dL
eAG (mmol/L): 6.6 mmol/L

## 2023-01-07 LAB — TSH: TSH: 2.38 m[IU]/L (ref 0.40–4.50)

## 2023-01-10 DIAGNOSIS — E559 Vitamin D deficiency, unspecified: Secondary | ICD-10-CM | POA: Diagnosis not present

## 2023-01-10 DIAGNOSIS — M810 Age-related osteoporosis without current pathological fracture: Secondary | ICD-10-CM | POA: Diagnosis not present

## 2023-01-12 ENCOUNTER — Ambulatory Visit (INDEPENDENT_AMBULATORY_CARE_PROVIDER_SITE_OTHER): Payer: Medicare HMO | Admitting: Family Medicine

## 2023-01-12 ENCOUNTER — Encounter: Payer: Self-pay | Admitting: Family Medicine

## 2023-01-12 ENCOUNTER — Other Ambulatory Visit: Payer: Self-pay | Admitting: Family Medicine

## 2023-01-12 VITALS — BP 124/80 | HR 83 | Ht 64.0 in | Wt 218.0 lb

## 2023-01-12 DIAGNOSIS — M810 Age-related osteoporosis without current pathological fracture: Secondary | ICD-10-CM | POA: Diagnosis not present

## 2023-01-12 DIAGNOSIS — I700 Atherosclerosis of aorta: Secondary | ICD-10-CM

## 2023-01-12 DIAGNOSIS — E7800 Pure hypercholesterolemia, unspecified: Secondary | ICD-10-CM

## 2023-01-12 DIAGNOSIS — B001 Herpesviral vesicular dermatitis: Secondary | ICD-10-CM

## 2023-01-12 DIAGNOSIS — E559 Vitamin D deficiency, unspecified: Secondary | ICD-10-CM

## 2023-01-12 DIAGNOSIS — F3342 Major depressive disorder, recurrent, in full remission: Secondary | ICD-10-CM | POA: Diagnosis not present

## 2023-01-12 DIAGNOSIS — R7309 Other abnormal glucose: Secondary | ICD-10-CM

## 2023-01-12 DIAGNOSIS — Z23 Encounter for immunization: Secondary | ICD-10-CM

## 2023-01-12 DIAGNOSIS — Z0000 Encounter for general adult medical examination without abnormal findings: Secondary | ICD-10-CM

## 2023-01-12 MED ORDER — VALACYCLOVIR HCL 1 G PO TABS
ORAL_TABLET | ORAL | 2 refills | Status: AC
Start: 2023-01-12 — End: ?

## 2023-01-12 NOTE — Progress Notes (Signed)
Subjective:    Patient ID: Kelly Weber, female    DOB: May 12, 1945, 77 y.o.   MRN: [id number]  Kelly Weber is a 77 y.o. female presenting on 01/12/2023 for Follow-up   HPI  Discussed the use of AI scribe software for clinical note transcription with the patient, who gave verbal consent to proceed.     The patient, with a history of prediabetes, bone density issues, and weight concerns, was seen for a follow-up visit. She reported being prescribed phentermine by an endocrinologist for weight loss, and have already noticed a decrease of four pounds. However, she were unaware of the potential for elevated blood pressure as a side effect of this medication. However, Her BP has been controlled.  The patient also reported an upcoming Reclast infusion treatment per Endocrine for Osteoporosis. although she were still awaiting a call to schedule the appointment.  In addition to these primary concerns, the patient mentioned a recurring issue with breakouts on her skin, for which she had previously been prescribed Valtrex. She requested a refill of this medication, as her supply was running low.  The patient's blood work results were discussed, revealing slightly low iron levels and a slightly elevated A1c, indicating mild prediabetes. Her cholesterol levels were also slightly above the desired range, but the patient expressed reluctance to take statins due to previous negative experiences with these medications.     01/12/2023    3:32 PM 12/03/2022    1:59 PM 06/17/2022    2:33 PM  Depression screen PHQ 2/9  Decreased Interest 0 2 0  Down, Depressed, Hopeless 0 2 1  PHQ - 2 Score 0 4 1  Altered sleeping 0 1 1  Tired, decreased energy 1 2 0  Change in appetite 1 0 0  Feeling bad or failure about yourself  0 0 0  Trouble concentrating 0 1 0  Moving slowly or fidgety/restless 0 0 0  Suicidal thoughts 0 0 0  PHQ-9 Score 2 8 2   Difficult doing work/chores Not difficult at all Not difficult at  all Somewhat difficult    Social History   Tobacco Use   Smoking status: Former    Current packs/day: 0.00    Average packs/day: 1 pack/day for 30.0 years (30.0 ttl pk-yrs)    Types: Cigarettes    Start date: 01/20/1984    Quit date: 01/19/2014    Years since quitting: 8.9   Smokeless tobacco: Former  Building services engineer status: Never Used  Substance Use Topics   Alcohol use: No    Alcohol/week: 0.0 standard drinks of alcohol   Drug use: No    Review of Systems Per HPI unless specifically indicated above     Objective:    BP 124/80   Pulse 83   Ht 5\' 4"  (1.626 m)   Wt 218 lb (98.9 kg)   SpO2 97%   BMI 37.42 kg/m   Wt Readings from Last 3 Encounters:  01/12/23 218 lb (98.9 kg)  12/03/22 220 lb (99.8 kg)  08/26/22 211 lb 10.3 oz (96 kg)    Physical Exam Vitals and nursing note reviewed.  Constitutional:      General: She is not in acute distress.    Appearance: She is well-developed. She is not diaphoretic.     Comments: Well-appearing, comfortable, cooperative  HENT:     Head: Normocephalic and atraumatic.  Eyes:     General:        Right eye: No discharge.  Left eye: No discharge.     Conjunctiva/sclera: Conjunctivae normal.  Neck:     Thyroid: No thyromegaly.  Cardiovascular:     Rate and Rhythm: Normal rate and regular rhythm.     Heart sounds: Normal heart sounds. No murmur heard. Pulmonary:     Effort: Pulmonary effort is normal. No respiratory distress.     Breath sounds: Normal breath sounds. No wheezing or rales.  Musculoskeletal:        General: Normal range of motion.     Cervical back: Normal range of motion and neck supple.  Lymphadenopathy:     Cervical: No cervical adenopathy.  Skin:    General: Skin is warm and dry.     Findings: No erythema or rash.  Neurological:     Mental Status: She is alert and oriented to person, place, and time.  Psychiatric:        Behavior: Behavior normal.     Comments: Well groomed, good eye  contact, normal speech and thoughts      Results for orders placed or performed in visit on 01/06/23  CBC with Differential/Platelet  Result Value Ref Range   WBC 4.6 3.8 - 10.8 Thousand/uL   RBC 4.34 3.80 - 5.10 Million/uL   Hemoglobin 11.4 (L) 11.7 - 15.5 g/dL   HCT 84.1 32.4 - 40.1 %   MCV 85.7 80.0 - 100.0 fL   MCH 26.3 (L) 27.0 - 33.0 pg   MCHC 30.6 (L) 32.0 - 36.0 g/dL   RDW 02.7 25.3 - 66.4 %   Platelets 260 140 - 400 Thousand/uL   MPV 10.4 7.5 - 12.5 fL   Neutro Abs 2,433 1,500 - 7,800 cells/uL   Lymphs Abs 1,651 850 - 3,900 cells/uL   Absolute Monocytes 373 200 - 950 cells/uL   Eosinophils Absolute 110 15 - 500 cells/uL   Basophils Absolute 32 0 - 200 cells/uL   Neutrophils Relative % 52.9 %   Total Lymphocyte 35.9 %   Monocytes Relative 8.1 %   Eosinophils Relative 2.4 %   Basophils Relative 0.7 %  Comprehensive metabolic panel  Result Value Ref Range   Glucose, Bld 98 65 - 99 mg/dL   BUN 11 7 - 25 mg/dL   Creat 4.03 4.74 - 2.59 mg/dL   BUN/Creatinine Ratio SEE NOTE: 6 - 22 (calc)   Sodium 141 135 - 146 mmol/L   Potassium 4.4 3.5 - 5.3 mmol/L   Chloride 106 98 - 110 mmol/L   CO2 28 20 - 32 mmol/L   Calcium 8.5 (L) 8.6 - 10.4 mg/dL   Total Protein 5.8 (L) 6.1 - 8.1 g/dL   Albumin 3.8 3.6 - 5.1 g/dL   Globulin 2.0 1.9 - 3.7 g/dL (calc)   AG Ratio 1.9 1.0 - 2.5 (calc)   Total Bilirubin 0.5 0.2 - 1.2 mg/dL   Alkaline phosphatase (APISO) 73 37 - 153 U/L   AST 14 10 - 35 U/L   ALT 9 6 - 29 U/L  Lipid panel  Result Value Ref Range   Cholesterol 194 <200 mg/dL   HDL 57 > OR = 50 mg/dL   Triglycerides 563 <875 mg/dL   LDL Cholesterol (Calc) 113 (H) mg/dL (calc)   Total CHOL/HDL Ratio 3.4 <5.0 (calc)   Non-HDL Cholesterol (Calc) 137 (H) <130 mg/dL (calc)  Hemoglobin I4P  Result Value Ref Range   Hgb A1c MFr Bld 5.8 (H) <5.7 % of total Hgb   Mean Plasma Glucose 120 mg/dL   eAG (  mmol/L) 6.6 mmol/L  TSH  Result Value Ref Range   TSH 2.38 0.40 - 4.50 mIU/L   VITAMIN D 25 Hydroxy (Vit-D Deficiency, Fractures)  Result Value Ref Range   Vit D, 25-Hydroxy 30 30 - 100 ng/mL      Assessment & Plan:   Problem List Items Addressed This Visit     Atherosclerosis of aorta (HCC)   Major depression, recurrent, full remission (HCC)   Osteoporosis of lumbar spine - Primary   Other Visit Diagnoses     Need for influenza vaccination       Fever blister       Relevant Medications   valACYclovir (VALTREX) 1000 MG tablet       Assessment and Plan    Prediabetes A1c mildly elevated at 5.8, consistent with previous readings. Discussed the importance of diet modification and weight loss. -Continue monitoring A1c annually. -Encouraged to limit carbs, starches, and sugars in diet.  Hyperlipidemia LDL slightly elevated at 113. Discussed the potential benefits of statin therapy, but patient declined due to previous side effects. -Monitor cholesterol levels annually. -Encouraged to continue dietary modifications and weight loss efforts.  Weight Management Patient has started Phentermine for weight loss. Discussed potential side effects, including elevated blood pressure. -Monitor blood pressure regularly due to potential side effects of Phentermine. -Encouraged to continue dietary modifications and exercise.  HSV flare recurrence Patient experiences occasional outbreaks and has been prescribed Valtrex previously for AS NEEDED abortive therapy I have called pharmacy to confirm her previous and agreed to refill Valtrex 1g tabs, take 2 for dose 2g TWICE A DAY x 1 day only  Vitamin D Deficiency Vitamin D levels are normal, indicating that current supplementation is effective. -Continue current Vitamin D supplementation.  Iron Deficiency Iron levels are slightly low, but patient only takes supplements occasionally. -Encouraged to continue occasional iron supplementation.  Osteoporosis Followed by Endocrine On upcoming Reclast infusion  General  Health Maintenance -Declined flu shot. -Plan to have infusion for bone density, awaiting appointment scheduling. -Continue monitoring blood work annually. -Follow-up appointment scheduled for next year.        Meds ordered this encounter  Medications   valACYclovir (VALTREX) 1000 MG tablet    Sig: Take 2 tablets (2000mg ) twice a day for one day for fever blister flare. May repeat course for future flares.    Dispense:  12 tablet    Refill:  2      Follow up plan: Return in about 1 year (around 01/12/2024) for 1 year fasting lab only then 1 week later Annual Physical.  Future labs ordered for 01/12/24   Saralyn Pilar, DO [facility] 01/12/2023, 3:34 PM

## 2023-01-12 NOTE — Patient Instructions (Addendum)
Thank you for coming to the office today.  Recent Labs    01/06/23 0801  HGBA1C 5.8*   Slightly elevated A1c, goal to keep improving on the diet limit carb starch sugars.  Caution on Phentermine for wt loss - caution with elevated BP, monitor blood pressure  We will re order the Valtrex 500 vs 1000mg  tablets, I will check with the pharmacy.  DUE for FASTING BLOOD WORK (no food or drink after midnight before the lab appointment, only water or coffee without cream/sugar on the morning of)  SCHEDULE "Lab Only" visit in the morning at the clinic for lab draw in 1 YEAR  - Make sure Lab Only appointment is at about 1 week before your next appointment, so that results will be available  For Lab Results, once available within 2-3 days of blood draw, you can can log in to MyChart online to view your results and a brief explanation. Also, we can discuss results at next follow-up visit.   Please schedule a Follow-up Appointment to: Return in about 1 year (around 01/12/2024) for 1 year fasting lab only then 1 week later Annual Physical.  If you have any other questions or concerns, please feel free to call the office or send a message through MyChart. You may also schedule an earlier appointment if necessary.  Additionally, you may be receiving a survey about your experience at our office within a few days to 1 week by e-mail or mail. We value your feedback.  Saralyn Pilar, DO [facility], [place]

## 2023-02-02 DIAGNOSIS — M810 Age-related osteoporosis without current pathological fracture: Secondary | ICD-10-CM | POA: Diagnosis not present

## 2023-04-26 ENCOUNTER — Encounter: Payer: Self-pay | Admitting: Family Medicine

## 2023-04-26 ENCOUNTER — Ambulatory Visit (INDEPENDENT_AMBULATORY_CARE_PROVIDER_SITE_OTHER): Payer: Medicare HMO | Admitting: Family Medicine

## 2023-04-26 VITALS — BP 140/88 | HR 73 | Ht 64.0 in | Wt 214.0 lb

## 2023-04-26 DIAGNOSIS — R030 Elevated blood-pressure reading, without diagnosis of hypertension: Secondary | ICD-10-CM

## 2023-04-26 DIAGNOSIS — M62830 Muscle spasm of back: Secondary | ICD-10-CM

## 2023-04-26 DIAGNOSIS — M545 Low back pain, unspecified: Secondary | ICD-10-CM | POA: Diagnosis not present

## 2023-04-26 MED ORDER — AMLODIPINE BESYLATE 5 MG PO TABS
5.0000 mg | ORAL_TABLET | Freq: Every day | ORAL | 0 refills | Status: DC
Start: 2023-04-26 — End: 2023-07-21

## 2023-04-26 MED ORDER — HYDROCODONE-ACETAMINOPHEN 5-325 MG PO TABS
1.0000 | ORAL_TABLET | Freq: Four times a day (QID) | ORAL | 0 refills | Status: DC | PRN
Start: 2023-04-26 — End: 2023-10-25

## 2023-04-26 MED ORDER — BACLOFEN 10 MG PO TABS
5.0000 mg | ORAL_TABLET | Freq: Three times a day (TID) | ORAL | 1 refills | Status: AC | PRN
Start: 2023-04-26 — End: ?

## 2023-04-26 NOTE — Patient Instructions (Addendum)
 Thank you for coming to the office today.  Likely Muscle spasm / strain  Start taking Baclofen  (Lioresal ) 10mg  (muscle relaxant) - start with half (cut) to one whole pill at night as needed for next 1-3 nights (may make you drowsy, caution with driving) see how it affects you, then if tolerated increase to one pill 2 to 3 times a day or (every 8 hours as needed)  Rx Hydrocodone  stronger pain medication as needed.  Take Amlodipine  5mg  daily with the Phentermine for blood pressure control.   Please schedule a Follow-up Appointment to: Return if symptoms worsen or fail to improve.  If you have any other questions or concerns, please feel free to call the office or send a message through MyChart. You may also schedule an earlier appointment if necessary.  Additionally, you may be receiving a survey about your experience at our office within a few days to 1 week by e-mail or mail. We value your feedback.  Marsa Officer, DO [facility], [place]
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
## 2023-05-12 ENCOUNTER — Telehealth: Payer: Self-pay | Admitting: Family Medicine

## 2023-05-12 NOTE — Telephone Encounter (Signed)
LVM 05/12/23 to r/s AWV due [facility] not working [employer]. New AWV app t3/14/25 at 320pm. Please confirm date change [facility]   Verlee Rossetti; Care Guide Ambulatory Clinical Support [place] [facility] Direct Dial: [phone]

## 2023-06-08 ENCOUNTER — Other Ambulatory Visit: Payer: Self-pay | Admitting: Family Medicine

## 2023-06-08 DIAGNOSIS — F3342 Major depressive disorder, recurrent, in full remission: Secondary | ICD-10-CM

## 2023-06-22 ENCOUNTER — Encounter: Payer: Self-pay | Admitting: Dermatology

## 2023-06-22 ENCOUNTER — Ambulatory Visit: Payer: Medicare HMO | Admitting: Dermatology

## 2023-06-22 DIAGNOSIS — L739 Follicular disorder, unspecified: Secondary | ICD-10-CM | POA: Diagnosis not present

## 2023-06-22 DIAGNOSIS — L821 Other seborrheic keratosis: Secondary | ICD-10-CM

## 2023-06-22 DIAGNOSIS — L820 Inflamed seborrheic keratosis: Secondary | ICD-10-CM

## 2023-06-22 MED ORDER — DOXYCYCLINE MONOHYDRATE 100 MG PO CAPS: 100.0000 mg | ORAL_CAPSULE | Freq: Every day | ORAL | 1 refills | Status: DC

## 2023-06-22 MED ORDER — FLUOCINONIDE 0.05 % EX SOLN: CUTANEOUS | 2 refills | Status: AC

## 2023-06-22 MED ORDER — CICLOPIROX 1 % EX SHAM: MEDICATED_SHAMPOO | CUTANEOUS | 3 refills | Status: DC

## 2023-06-22 NOTE — Progress Notes (Signed)
 Follow-Up Visit   Subjective  Kelly Weber is a 78 y.o. female who presents for the following: Spots on back and sides of abdomen that are Raised, rubbed, irritated. Dark in color.  Lesions in scalp.  Spots frozen last visit which didn't help.  Topical liquid doesn't clear up spots in scalp. She tends to pick at them.  The patient has spots, moles and lesions to be evaluated, some may be new or changing and the patient may have concern these could be cancer.    The following portions of the chart were reviewed this encounter and updated as appropriate: medications, allergies, medical history  Review of Systems:  No other skin or systemic complaints except as noted in HPI or Assessment and Plan.  Objective  Well appearing patient in no apparent distress; mood and affect are within normal limits.  A focused examination was performed of the following areas: Back   Relevant physical exam findings are noted in the Assessment and Plan.  L flank at waist line x1, L flank at bra line x2, R flank at bra line x1, R upper back x2 (6) Erythematous keratotic or waxy stuck-on papule or plaque.   Assessment & Plan   SEBORRHEIC KERATOSIS - Stuck-on, waxy, tan-brown papules and/or plaques  - Benign-appearing - Discussed benign etiology and prognosis. - Observe - Call for any changes  FOLLICULITIS vs Prurigo Nodules vs ISKs Exam: crusted firm light tan papules at occipital scalp  Folliculitis occurs due to inflammation of the superficial hair follicle (pore), resulting in acne-like lesions (pus bumps). It can be infectious (bacterial, fungal) or noninfectious (shaving, tight clothing, heat/sweat, medications).  Folliculitis can be acute or chronic and recommended treatment depends on the underlying cause of folliculitis.  Treatment Plan: Continue Fluocinonide solution 1-2 times daily to affected areas on scalp as needed for itching. Avoid applying to face, groin, and axilla. Use as  directed. Long-term use can cause thinning of the skin.  Continue Ciclopirox shampoo 2-3 times at week, lather on scalp, leave on 5-10 minutes, rinse out.   Start Doxycycline 100 mg once daily with food.   Doxycycline should be taken with food to prevent nausea. Do not lay down for 30 minutes after taking. Be cautious with sun exposure and use good sun protection while on this medication. Pregnant women should not take this medication.   Topical steroids (such as triamcinolone, fluocinolone, fluocinonide, mometasone, clobetasol, halobetasol, betamethasone, hydrocortisone) can cause thinning and lightening of the skin if they are used for too long in the same area. Your physician has selected the right strength medicine for your problem and area affected on the body. Please use your medication only as directed by your physician to prevent side effects.      INFLAMED SEBORRHEIC KERATOSIS (6) L flank at waist line x1, L flank at bra line x2, R flank at bra line x1, R upper back x2 (6) Symptomatic, irritating, patient would like treated. Destruction of lesion - L flank at waist line x1, L flank at bra line x2, R flank at bra line x1, R upper back x2 (6)  Destruction method: cryotherapy   Informed consent: discussed and consent obtained   Lesion destroyed using liquid nitrogen: Yes   Region frozen until ice ball extended beyond lesion: Yes   Outcome: patient tolerated procedure well with no complications   Post-procedure details: wound care instructions given   Additional details:  Prior to procedure, discussed risks of blister formation, small wound, skin dyspigmentation, or  rare scar following cryotherapy. Recommend Vaseline ointment to treated areas while healing.    Return for ISK Follow Up, Folliculitis follow up in 2-3 months.  I, Lawson Radar, CMA, am acting as scribe for Willeen Niece, MD.   Documentation: I have reviewed the above documentation for accuracy and completeness, and I  agree with the above.  Willeen Niece, MD

## 2023-06-22 NOTE — Patient Instructions (Signed)
 Cryotherapy Aftercare  Wash gently with soap and water everyday.   Apply Vaseline and Band-Aid daily until healed.      Continue Fluocinonide solution 1-2 times daily to affected areas on scalp as needed for itching. Avoid applying to face, groin, and axilla. Use as directed. Long-term use can cause thinning of the skin.   Continue Ciclopirox shampoo 2-3 times at week, lather on scalp, leave on 5-10 minutes, rinse out.   Start Doxycycline 100 mg once daily with food.   Doxycycline should be taken with food to prevent nausea. Do not lay down for 30 minutes after taking. Be cautious with sun exposure and use good sun protection while on this medication. Pregnant women should not take this medication.   Topical steroids (such as triamcinolone, fluocinolone, fluocinonide, mometasone, clobetasol, halobetasol, betamethasone, hydrocortisone) can cause thinning and lightening of the skin if they are used for too long in the same area. Your physician has selected the right strength medicine for your problem and area affected on the body. Please use your medication only as directed by your physician to prevent side effects.    Recommend daily broad spectrum sunscreen SPF 30+ to sun-exposed areas, reapply every 2 hours as needed. Call for new or changing lesions.  Staying in the shade or wearing long sleeves, sun glasses (UVA+UVB protection) and wide brim hats (4-inch brim around the entire circumference of the hat) are also recommended for sun protection.     Seborrheic Keratosis  What causes seborrheic keratoses? Seborrheic keratoses are harmless, common skin growths that first appear during adult life.  As time goes by, more growths appear.  Some people may develop a large number of them.  Seborrheic keratoses appear on both covered and uncovered body parts.  They are not caused by sunlight.  The tendency to develop seborrheic keratoses can be inherited.  They vary in color from skin-colored to  gray, brown, or even black.  They can be either smooth or have a rough, warty surface.   Seborrheic keratoses are superficial and look as if they were stuck on the skin.  Under the microscope this type of keratosis looks like layers upon layers of skin.  That is why at times the top layer may seem to fall off, but the rest of the growth remains and re-grows.    Treatment Seborrheic keratoses do not need to be treated, but can easily be removed in the office.  Seborrheic keratoses often cause symptoms when they rub on clothing or jewelry.  Lesions can be in the way of shaving.  If they become inflamed, they can cause itching, soreness, or burning.  Removal of a seborrheic keratosis can be accomplished by freezing, burning, or surgery. If any spot bleeds, scabs, or grows rapidly, please return to have it checked, as these can be an indication of a skin cancer.     Due to recent changes in healthcare laws, you may see results of your pathology and/or laboratory studies on MyChart before the doctors have had a chance to review them. We understand that in some cases there may be results that are confusing or concerning to you. Please understand that not all results are received at the same time and often the doctors may need to interpret multiple results in order to provide you with the best plan of care or course of treatment. Therefore, we ask that you please give [place] 2 business days to thoroughly review all your results before contacting the office for clarification. Should  we see a critical lab result, you will be contacted sooner.   If You Need Anything After Your Visit  If you have any questions or concerns for your doctor, please call our main line at [phone] and press option 4 to reach your doctor's medical assistant. If no one answers, please leave a voicemail as directed and we will return your call as soon as possible. Messages left after 4 pm will be answered the following business day.    You may also send [place] a message via MyChart. We typically respond to MyChart messages within 1-2 business days.  For prescription refills, please ask your pharmacy to contact our office. Our fax number is [phone].  If you have an urgent issue when the clinic is closed that cannot wait until the next business day, you can page your doctor at the number below.    Please note that while we do our best to be available for urgent issues outside of office hours, we are not available 24/7.   If you have an urgent issue and are unable to reach [place], you may choose to seek medical care at your doctor's office, retail clinic, urgent care center, or emergency room.  If you have a medical emergency, please immediately call 911 or go to the emergency department.  Pager Numbers  - Dr. Gwen Pounds: [phone]  - Dr. Roseanne Reno: [phone]  - Dr. Katrinka Blazing: [phone]   In the event of inclement weather, please call our main line at [phone] for an update on the status of any delays or closures.  Dermatology Medication Tips: Please keep the boxes that topical medications come in in order to help keep track of the instructions about where and how to use these. Pharmacies typically print the medication instructions only on the boxes and not directly on the medication tubes.   If your medication is too expensive, please contact our office at [phone] option 4 or send [place] a message through MyChart.   We are unable to tell what your co-pay for medications will be in advance as this is different depending on your insurance coverage. However, we may be able to find a substitute medication at lower cost or fill out paperwork to get insurance to cover a needed medication.   If a prior authorization is required to get your medication covered by your insurance company, please allow [place] 1-2 business days to complete this process.  Drug prices often vary depending on where the prescription is filled and  some pharmacies may offer cheaper prices.  The website www.goodrx.com contains coupons for medications through different pharmacies. The prices here do not account for what the cost may be with help from insurance (it may be cheaper with your insurance), but the website can give you the price if you did not use any insurance.  - You can print the associated coupon and take it with your prescription to the pharmacy.  - You may also stop by our office during regular business hours and pick up a GoodRx coupon card.  - If you need your prescription sent electronically to a different pharmacy, notify our office through [facility] or by phone at [phone] option 4.     Si Usted Necesita Algo Despus de Su Visita  Tambin puede enviarnos un mensaje a travs de Clinical cytogeneticist. Por lo general respondemos a los mensajes de MyChart en el transcurso de 1 a 2 das hbiles.  Para renovar recetas, por favor pida a su farmacia que se ponga  en contacto con nuestra oficina. Annie Sable de fax es [place] [phone].  Si tiene un asunto urgente cuando la clnica est cerrada y que no puede esperar hasta el siguiente da hbil, puede llamar/localizar a su doctor(a) al nmero que aparece a continuacin.   Por favor, tenga en cuenta que aunque hacemos todo lo posible para estar disponibles para asuntos urgentes fuera del horario de [place], no estamos disponibles las 24 horas del da, los 7 [address]  [address] de la [place].   Si tiene un problema urgente y no puede comunicarse con nosotros, puede optar por buscar atencin mdica  en el consultorio de su doctor(a), en una clnica privada, en un centro de atencin urgente o en una sala de emergencias.  Si tiene Engineer, drilling, por favor llame inmediatamente al 911 o vaya a la sala de emergencias.  Nmeros de bper  - Dr. Gwen Pounds: [phone]  - Dra. Roseanne Reno: [phone]  - Dr. Katrinka Blazing: [phone]   En caso de inclemencias del tiempo, por favor llame a Lacy Duverney principal al [phone] para una actualizacin sobre el [employer] de cualquier retraso o cierre.  Consejos para la medicacin en dermatologa: Por favor, guarde las cajas en las que vienen los medicamentos de uso tpico para ayudarle a seguir las instrucciones sobre dnde y cmo usarlos. Las farmacias generalmente imprimen las instrucciones del medicamento slo en las cajas y no directamente en los tubos del [place].   Si su medicamento es muy caro, por favor, pngase en contacto con Rolm Gala llamando al [phone] y presione la opcin 4 o envenos un mensaje a travs de Clinical cytogeneticist.   No podemos decirle cul ser su copago por los medicamentos por adelantado ya que esto es diferente dependiendo de la cobertura de su seguro. Sin embargo, es posible que podamos encontrar un medicamento sustituto a Audiological scientist un formulario para que el seguro cubra el medicamento que se considera necesario.   Si se requiere una autorizacin previa para que su compaa de seguros [place] su medicamento, por favor permtanos de 1 a 2 das hbiles para completar [address]  Los precios de los medicamentos varan con frecuencia dependiendo del Environmental consultant de dnde se surte la receta y alguna farmacias pueden ofrecer precios ms baratos.  El sitio web www.goodrx.com tiene cupones para medicamentos de Health and safety inspector. Los precios aqu no tienen en cuenta lo que podra costar con la ayuda del seguro (puede ser ms barato con su seguro), pero el sitio web puede darle el precio si no utiliz Tourist information centre manager.  - Puede imprimir el cupn correspondiente y llevarlo con su receta a la farmacia.  - Tambin puede pasar por nuestra oficina durante el horario de atencin regular y Education officer, museum una tarjeta de cupones de GoodRx.  - Si necesita que su receta se enve electrnicamente a una farmacia diferente, informe a nuestra oficina a travs de MyChart de [place] o por telfono llamando al [phone] y presione la  opcin 4.

## 2023-06-24 ENCOUNTER — Ambulatory Visit: Payer: Self-pay

## 2023-06-24 DIAGNOSIS — Z0000 Encounter for general adult medical examination without abnormal findings: Secondary | ICD-10-CM

## 2023-06-24 NOTE — Progress Notes (Signed)
 Subjective:   Kelly Weber is a 78 y.o. who presents for a Medicare Wellness preventive visit.  Visit Complete: Virtual I connected with  Kelly Weber on 06/24/23 by a audio enabled telemedicine application and verified that I am speaking with the correct person using two identifiers.  Patient Location: Home  Provider Location: Office/Clinic  I discussed the limitations of evaluation and management by telemedicine. The patient expressed understanding and agreed to proceed.  Vital Signs: Because this visit was a virtual/telehealth visit, some criteria may be missing or patient reported. Any vitals not documented were not able to be obtained and vitals that have been documented are patient reported.  VideoDeclined- This patient declined Librarian, academic. Therefore the visit was completed with audio only.  Persons Participating in Visit: Patient.  AWV Questionnaire: No: Patient Medicare AWV questionnaire was not completed prior to this visit.  Cardiac Risk Factors include: advanced age (>71men, >13 women);dyslipidemia;obesity (BMI >30kg/m2)     Objective:    There were no vitals filed for this visit. There is no height or weight on file to calculate BMI.     06/24/2023    3:27 PM 08/26/2022   12:26 PM 07/27/2022    6:31 PM 06/17/2022    2:36 PM 06/07/2022    2:44 PM 06/07/2022    9:19 AM 12/27/2021   10:09 AM  Advanced Directives  Does Patient Have a Medical Advance Directive? No No No No No No No  Would patient like information on creating a medical advance directive? No - Patient declined   No - Patient declined No - Patient declined Yes (MAU/Ambulatory/Procedural Areas - Information given) No - Patient declined    Current Medications (verified) Outpatient Encounter Medications as of 06/24/2023  Medication Sig   albuterol (PROVENTIL) (2.5 MG/3ML) 0.083% nebulizer solution Take 3 mLs (2.5 mg total) by nebulization every 4 (four) hours as needed  for wheezing or shortness of breath.   albuterol (VENTOLIN HFA) 108 (90 Base) MCG/ACT inhaler Inhale 2 puffs into the lungs every 6 (six) hours as needed for wheezing or shortness of breath.   amLODipine (NORVASC) 5 MG tablet Take 1 tablet (5 mg total) by mouth daily.   azelastine (ASTELIN) 0.1 % nasal spray Place 2 sprays into both nostrils 2 (two) times daily. Use in each nostril as directed   baclofen (LIORESAL) 10 MG tablet Take 0.5-1 tablets (5-10 mg total) by mouth 3 (three) times daily as needed for muscle spasms.   BREZTRI AEROSPHERE 160-9-4.8 MCG/ACT AERO Inhale 2 puffs into the lungs 2 (two) times daily.   Calcium Carbonate-Vit D-Min (CALCIUM 1200 PO) Take by mouth daily.   Ciclopirox 1 % shampoo 2-3 times per week lather on scalp, leave on 5-10 minutes, rinse out   doxycycline (MONODOX) 100 MG capsule Take 1 capsule (100 mg total) by mouth daily. Take with food   fluocinonide (LIDEX) 0.05 % external solution Apply 1-2 times daily to affected areas on scalp as needed for itching.   HYDROcodone-acetaminophen (NORCO/VICODIN) 5-325 MG tablet Take 1 tablet by mouth every 6 (six) hours as needed for moderate pain (pain score 4-6).   phentermine (ADIPEX-P) 37.5 MG tablet Take 37.5 mg by mouth daily before breakfast.   TURMERIC PO Take by mouth daily.   valACYclovir (VALTREX) 1000 MG tablet Take 2 tablets (2000mg ) twice a day for one day for fever blister flare. May repeat course for future flares.   venlafaxine XR (EFFEXOR-XR) 37.5 MG 24 hr capsule TAKE  1 CAPSULE BY MOUTH DAILY WITH BREAKFAST   No facility-administered encounter medications on file as of 06/24/2023.    Allergies (verified) Penicillins   History: Past Medical History:  Diagnosis Date   Allergies    Anxiety    Arthritis    fingers, left knee   COPD (chronic obstructive pulmonary disease) (HCC)    Depression    Diverticulitis    Dyspnea    Fatigue    GERD (gastroesophageal reflux disease)    HLD (hyperlipidemia)     Insomnia 2006   Lower back pain    Skin cancer 2007   Basal Cell CA resected from Left middle finger   Past Surgical History:  Procedure Laterality Date   BREAST CYST ASPIRATION Bilateral    30 years ago   CATARACT EXTRACTION W/ INTRAOCULAR LENS IMPLANT Bilateral 2023   [name]   COLONOSCOPY WITH PROPOFOL N/A 11/20/2014   Procedure: COLONOSCOPY WITH PROPOFOL;  Surgeon: Midge Minium, MD;  Location[facility];  Service: Endoscopy;  Laterality: N/A;   COLONOSCOPY WITH PROPOFOL N/A 06/07/2022   Procedure: COLONOSCOPY WITH PROPOFOL;  Surgeon: Midge Minium, MD;  Location: [facility];  Service: Endoscopy;  Laterality: N/A;   ESOPHAGOGASTRODUODENOSCOPY (EGD) WITH PROPOFOL N/A 11/20/2014   Procedure: ESOPHAGOGASTRODUODENOSCOPY (EGD) WITH PROPOFOL;  Surgeon: Midge Minium, MD;  Location: [facility];  Service: Endoscopy;  Laterality: N/A;   POLYPECTOMY  11/20/2014   Procedure: POLYPECTOMY;  Surgeon: Midge Minium, MD;  Location: [facility];  Service: Endoscopy;;   SKIN CANCER EXCISION Left    finger   TUBAL LIGATION  1970   Family History  Problem Relation Age of Onset   Brain cancer Mother    Thyroid disease Mother    Hypertension Mother    Breast cancer Maternal Aunt 26   Breast cancer Cousin 35   Breast cancer Maternal Aunt    Social History   Socioeconomic History   Marital status: Married    Spouse name: Kelly Weber   Number of children: Not on file   Years of education: Not on file   Highest education level: Not on file  Occupational History   Occupation: Retired   Occupation: retired  Tobacco Use   Smoking status: Former    Current packs/day: 0.00    Average packs/day: 1 pack/day for 30.0 years (30.0 ttl pk-yrs)    Types: Cigarettes    Start date: 01/20/1984    Quit date: 01/19/2014    Years since quitting: 9.4   Smokeless tobacco: Former  Building services engineer status: Never Used  Substance and Sexual Activity   Alcohol use: No     Alcohol/week: 0.0 standard drinks of alcohol   Drug use: No   Sexual activity: Not Currently  Other Topics Concern   Not on file  Social History Narrative   Not on file   Social Drivers of Health   Financial Resource Strain: Low Risk  (06/24/2023)   Overall Financial Resource Strain (CARDIA)    Difficulty of Paying Living Expenses: Not hard at all  Food Insecurity: No Food Insecurity (06/24/2023)   Hunger Vital Sign    Worried About Running Out of Food in the Last Year: Never true    Ran Out of Food in the Last Year: Never true  Transportation Needs: No Transportation Needs (06/24/2023)   PRAPARE - Administrator, Civil Service (Medical): No    Lack of Transportation (Non-Medical): No  Physical Activity: Insufficiently Active (06/24/2023)  Exercise Vital Sign    Days of Exercise per Week: 3 days    Minutes of Exercise per Session: 20 min  Stress: No Stress Concern Present (06/24/2023)   [employer] of Occupational Health - Occupational Stress Questionnaire    Feeling of Stress : Only a little  Social Connections: Moderately Integrated (06/24/2023)   Social Connection and Isolation Panel [NHANES]    Frequency of Communication with Friends and Family: More than three times a week    Frequency of Social Gatherings with Friends and Family: Never    Attends Religious Services: More than 4 times per year    Active Member of [employer] or Organizations: No    Attends Engineer, structural: Never    Marital Status: Married    Tobacco Counseling Counseling given: Not Answered    Clinical Intake:  Pre-visit preparation completed: Yes  Pain : No/denies pain     BMI - recorded: 36.7 Nutritional Status: BMI > 30  Obese Nutritional Risks: None Diabetes: No  How often do you need to have someone help you when you read instructions, pamphlets, or other written materials from your doctor or pharmacy?: 1 - Never  Interpreter Needed?: No  Information  entered by :: Kennedy Bucker, LPN   Activities of Daily Living     06/24/2023    3:28 PM 01/12/2023    3:33 PM  In your present state of health, do you have any difficulty performing the following activities:  Hearing? 0 0  Vision? 0 0  Difficulty concentrating or making decisions? 0 0  Walking or climbing stairs? 0 1  Dressing or bathing? 0   Doing errands, shopping? 0   Preparing Food and eating ? N   Using the Toilet? N   In the past six months, have you accidently leaked urine? N   Do you have problems with loss of bowel control? N   Managing your Medications? N   Managing your Finances? N   Housekeeping or managing your Housekeeping? N     Patient Care Team: Smitty Cords, DO as PCP - General (Family Medicine) Antonieta Iba, MD as Consulting Physician (Cardiology)  Indicate any recent Medical Services you may have received from other than [facility] providers in the past year (date may be approximate).     Assessment:   This is a routine wellness examination for [facility].  Hearing/Vision screen Hearing Screening - Comments:: NO AIDS Vision Screening - Comments:: READERS, HAD CATARACT SGY- DR.BENTON   Goals Addressed             This Visit's Progress    Cut out extra servings         Depression Screen     06/24/2023    3:25 PM 04/26/2023    2:09 PM 01/12/2023    3:32 PM 12/03/2022    1:59 PM 06/17/2022    2:33 PM 06/11/2022    3:28 PM 06/04/2021   10:17 AM  PHQ 2/9 Scores  PHQ - 2 Score 0 4 0 4 1 2  0  PHQ- 9 Score 0 6 2 8 2 5 1     Fall Risk     06/24/2023    3:28 PM 04/26/2023    2:09 PM 12/03/2022    1:59 PM 06/17/2022    2:36 PM 06/11/2022    3:40 PM  Fall Risk   Falls in the past year? 1 0 0 0 0  Number falls in past yr: 0  0 0 0  Injury with Fall? 1  0 0 0  Risk for fall due to :   No Fall Risks No Fall Risks No Fall Risks  Follow up Falls prevention discussed;Falls evaluation completed  Falls evaluation completed Falls prevention discussed;Falls  evaluation completed Falls evaluation completed    MEDICARE RISK AT HOME:  Medicare Risk at Home Any stairs in or around the home?: Yes If so, are there any without handrails?: No Home free of loose throw rugs in walkways, pet beds, electrical cords, etc?: Yes Adequate lighting in your home to reduce risk of falls?: Yes Life alert?: No Use of a cane, walker or w/c?: No Grab bars in the bathroom?: No Shower chair or bench in shower?: No Elevated toilet seat or a handicapped toilet?: Yes  TIMED UP AND GO:  Was the test performed?  No  Cognitive Function: 6CIT completed        06/24/2023    3:30 PM 06/17/2022    2:41 PM 06/04/2021   10:27 AM  6CIT Screen  What Year? 0 points 0 points 0 points  What month? 0 points 0 points 0 points  What time? 0 points 0 points 0 points  Count back from 20 0 points 0 points 0 points  Months in reverse 4 points 2 points 0 points  Repeat phrase 2 points 0 points 0 points  Total Score 6 points 2 points 0 points    Immunizations Immunization History  Administered Date(s) Administered   Fluad Quad(high Dose 65+) 01/29/2019, 01/30/2020, 02/17/2021, 04/27/2022   Influenza, High Dose Seasonal PF 01/18/2014, 02/01/2018   Influenza,inj,Quad PF,6+ Mos 02/06/2013   Influenza-Unspecified 01/18/2014   Pneumococcal Conjugate-13 01/02/2014   Pneumococcal Polysaccharide-23 02/01/2018   Tdap 04/13/2011    Screening Tests Health Maintenance  Topic Date Due   COVID-19 Vaccine (1) Never done   Zoster Vaccines- Shingrix (1 of 2) Never done   Lung Cancer Screening  01/19/2015   DTaP/Tdap/Td (2 - Td or Tdap) 04/12/2021   INFLUENZA VACCINE  07/11/2023 (Originally 11/11/2022)   MAMMOGRAM  10/15/2023   Medicare Annual Wellness (AWV)  06/23/2024   DEXA SCAN  10/18/2024   Colonoscopy  06/08/2027   Pneumonia Vaccine 33+ Years old  Completed   Hepatitis C Screening  Completed   HPV VACCINES  Aged Out    Health Maintenance  Health Maintenance Due  Topic  Date Due   COVID-19 Vaccine (1) Never done   Zoster Vaccines- Shingrix (1 of 2) Never done   Lung Cancer Screening  01/19/2015   DTaP/Tdap/Td (2 - Td or Tdap) 04/12/2021   Health Maintenance Items Addressed: UP TO DATE, NO SHINGRIX SHOTS OR COVID SHOTS  Additional Screening:  Vision Screening: Recommended annual ophthalmology exams for early detection of glaucoma and other disorders of the eye.  Dental Screening: Recommended annual dental exams for proper oral hygiene  Community Resource Referral / Chronic Care Management: CRR required this visit?  No   CCM required this visit?  No     Plan:     I have personally reviewed and noted the following in the patient's chart:   Medical and social history Use of alcohol, tobacco or illicit drugs  Current medications and supplements including opioid prescriptions. Patient is currently taking opioid prescriptions. Information provided to patient regarding non-opioid alternatives. Patient advised to discuss non-opioid treatment plan with their provider. Functional ability and status Nutritional status Physical activity Advanced directives List of other physicians Hospitalizations, surgeries, and ER visits in previous 12 months Vitals Screenings  to include cognitive, depression, and falls Referrals and appointments  In addition, I have reviewed and discussed with patient certain preventive protocols, quality metrics, and best practice recommendations. A written personalized care plan for preventive services as well as general preventive health recommendations were provided to patient.     Hal Hope, LPN   10/09/5282   After Visit Summary: (MyChart) Due to this being a telephonic visit, the after visit summary with patients personalized plan was offered to patient via MyChart   Notes: Nothing significant to report at this time.

## 2023-06-24 NOTE — Patient Instructions (Addendum)
 Kelly Weber , Thank you for taking time to come for your Medicare Wellness Visit. I appreciate your ongoing commitment to your health goals. Please review the following plan we discussed and let me know if I can assist you in the future.   Referrals/Orders/Follow-Ups/Clinician Recommendations: NONE  This is a list of the screening recommended for you and due dates:  Health Maintenance  Topic Date Due   COVID-19 Vaccine (1) Never done   Zoster (Shingles) Vaccine (1 of 2) Never done   Screening for Lung Cancer  01/19/2015   DTaP/Tdap/Td vaccine (2 - Td or Tdap) 04/12/2021   Flu Shot  07/11/2023*   Mammogram  10/15/2023   Medicare Annual Wellness Visit  06/23/2024   DEXA scan (bone density measurement)  10/18/2024   Colon Cancer Screening  06/08/2027   Pneumonia Vaccine  Completed   Hepatitis C Screening  Completed   HPV Vaccine  Aged Out  *Topic was postponed. The date shown is not the original due date.    Advanced directives: (ACP Link)Information on Advanced Care Planning can be found at [facility] [place] Advance Health Care Directives Advance Health Care Directives. [email or web address]   Next Medicare Annual Wellness Visit scheduled for next year: Yes  06/29/24 @ 2:00 PM BY PHONE

## 2023-07-21 ENCOUNTER — Other Ambulatory Visit: Payer: Self-pay | Admitting: Family Medicine

## 2023-07-21 DIAGNOSIS — R030 Elevated blood-pressure reading, without diagnosis of hypertension: Secondary | ICD-10-CM

## 2023-07-21 NOTE — Telephone Encounter (Signed)
 Requested Prescriptions  Pending Prescriptions Disp Refills   amLODipine (NORVASC) 5 MG tablet [Pharmacy Med Name: amLODIPine BESYLATE 5 MG TAB] 90 tablet 0    Sig: TAKE 1 TABLET BY MOUTH DAILY     Cardiovascular: Calcium Channel Blockers 2 Failed - 07/21/2023  3:42 PM      Failed - Last BP in normal range    BP Readings from Last 1 Encounters:  04/26/23 (!) 140/88         Passed - Last Heart Rate in normal range    Pulse Readings from Last 1 Encounters:  04/26/23 73         Passed - Valid encounter within last 6 months    Recent Outpatient Visits   None     Future Appointments             In 1 month Willeen Niece, MD [facility]   In 6 months Althea Charon, Netta Neat, DO [place] [facility], [facility]

## 2023-08-04 ENCOUNTER — Telehealth: Payer: Self-pay | Admitting: Family Medicine

## 2023-08-04 DIAGNOSIS — J432 Centrilobular emphysema: Secondary | ICD-10-CM

## 2023-08-04 MED ORDER — ALBUTEROL SULFATE (2.5 MG/3ML) 0.083% IN NEBU: 2.5000 mg | INHALATION_SOLUTION | RESPIRATORY_TRACT | 3 refills | Status: AC | PRN

## 2023-08-04 NOTE — Telephone Encounter (Signed)
 [facility] Clinical Team - Can you notify patient that we have samples of Breztri  she can pick up at least 2 (1 week per sample) while we try to work with clinical pharmacist to find out a cost effective option. She contacted us  with report that Breztri  was too high cost.  Referral sent to Clinical Pharmacy. They will contact her to arrange apt.  Domingo Friend, DO [facility] 08/04/2023, 11:46 AM

## 2023-08-04 NOTE — Telephone Encounter (Signed)
 Patient advised and samples placed up front for pick up.

## 2023-08-04 NOTE — Telephone Encounter (Signed)
 APrescription Request  08/04/2023  LOV: 04/26/2023  What is the name of the medication or equipment? ALBUTEROL   Have you contacted your pharmacy to request a refill? No   Which pharmacy would you like this sent to?  [facility] [id number] [place], [place] - [address] [place] [address] Phone: [phone] Fax: [phone]    Patient notified that their request is being sent to the clinical staff for review and that they should receive a response within 2 business days.   Please advise at [facility] [phone]

## 2023-08-05 ENCOUNTER — Encounter: Payer: Self-pay | Admitting: Pharmacist

## 2023-08-05 ENCOUNTER — Other Ambulatory Visit: Admitting: Pharmacist

## 2023-08-05 ENCOUNTER — Other Ambulatory Visit (HOSPITAL_COMMUNITY): Payer: Self-pay

## 2023-08-05 ENCOUNTER — Other Ambulatory Visit: Payer: Self-pay | Admitting: Family Medicine

## 2023-08-05 ENCOUNTER — Telehealth: Payer: Self-pay

## 2023-08-05 DIAGNOSIS — J432 Centrilobular emphysema: Secondary | ICD-10-CM

## 2023-08-05 DIAGNOSIS — J9801 Acute bronchospasm: Secondary | ICD-10-CM

## 2023-08-05 MED ORDER — ALBUTEROL SULFATE HFA 108 (90 BASE) MCG/ACT IN AERS: 2.0000 | INHALATION_SPRAY | Freq: Four times a day (QID) | RESPIRATORY_TRACT | 1 refills | Status: AC | PRN

## 2023-08-05 MED ORDER — FLUTICASONE-SALMETEROL 250-50 MCG/ACT IN AEPB: 1.0000 | INHALATION_SPRAY | Freq: Two times a day (BID) | RESPIRATORY_TRACT | 12 refills | Status: DC

## 2023-08-05 NOTE — Patient Instructions (Signed)
 Goals Addressed             This Visit's Progress    Pharmacy Goals       Once your have used up the sample of the Breztri  inhaler, you will SWITCH to using the generic Advair 250-50 mcg/act inhaler - Inhale 1 puff into the lungs in the morning and at bedtime as your maintenance inhaler    The following is the "how to use" video for this inhaler:   [email or web address]   Please remember to rinse and spit out after each use of this inhaler   Thank you!   Arthur Lash, PharmD, Becky Bowels, CPP Clinical Pharmacist [facility] [phone]

## 2023-08-05 NOTE — Progress Notes (Signed)
 08/05/2023 Name: Kelly Weber MRN: [id number] DOB: [birth year]  Chief Complaint  Patient presents with   Medication Assistance    Kelly Weber is a 78 y.o. year old female who presented for a telephone visit.   They were referred to the pharmacist by their PCP for assistance in managing medication access.    Subjective:  Care Team: Primary Care Provider: Raina Bunting, DO ; Next Scheduled Visit: 01/20/2024 Endocrinologist Tyron Gallon, MD; Next Scheduled Visit: 01/16/2024   Medication Access/Adherence  Current Pharmacy:  [facility] [id number] [place], [place] - [address] [place] [address] Phone: [phone] Fax: [phone]   Patient reports affordability concerns with their medications: Yes  Patient reports access/transportation concerns to their pharmacy: No  Patient reports adherence concerns with their medications:  No     COPD:  Current medications: - Breztri  inhaler - 2 puffs twice daily (taking from sample) - albuterol  nebulizer solution - 1 vial via nebulizer every 4 hours as needed for wheezing/shortness of breath - albuterol  HFA inhaler - 2 puffs every 6 hours as needed for wheezing or shortness of breath  Reports that she has been out of her maintenance inhaler (Breztri ) due to cost. Restarted today from sample from office, but requesting an affordable alternative as Breztri  inhaler cost is ~$140/month through her insurance plan  Patient also request renewal of her albuterol  MDI rescue inhaler    Objective:   Lab Results  Component Value Date   CREATININE 0.84 01/06/2023   BUN 11 01/06/2023   NA 141 01/06/2023   K 4.4 01/06/2023   CL 106 01/06/2023   CO2 28 01/06/2023    Current Outpatient Medications on File Prior to Visit  Medication Sig Dispense Refill   albuterol  (PROVENTIL ) (2.5 MG/3ML) 0.083% nebulizer solution Take 3 mLs (2.5 mg total) by nebulization every 4 (four) hours as  needed for wheezing or shortness of breath. 75 mL 3   amLODipine  (NORVASC ) 5 MG tablet TAKE 1 TABLET BY MOUTH DAILY 90 tablet 0   azelastine  (ASTELIN ) 0.1 % nasal spray Place 2 sprays into both nostrils 2 (two) times daily. Use in each nostril as directed 90 mL 3   baclofen  (LIORESAL ) 10 MG tablet Take 0.5-1 tablets (5-10 mg total) by mouth 3 (three) times daily as needed for muscle spasms. 30 each 1   Calcium  Carbonate-Vit D-Min (CALCIUM  1200 PO) Take by mouth daily.     Ciclopirox  1 % shampoo 2-3 times per week lather on scalp, leave on 5-10 minutes, rinse out 120 mL 3   doxycycline  (MONODOX ) 100 MG capsule Take 1 capsule (100 mg total) by mouth daily. Take with food 30 capsule 1   fluocinonide  (LIDEX ) 0.05 % external solution Apply 1-2 times daily to affected areas on scalp as needed for itching. 60 mL 2   HYDROcodone -acetaminophen  (NORCO/VICODIN) 5-325 MG tablet Take 1 tablet by mouth every 6 (six) hours as needed for moderate pain (pain score 4-6). 20 tablet 0   phentermine (ADIPEX-P) 37.5 MG tablet Take 37.5 mg by mouth daily before breakfast.     TURMERIC PO Take by mouth daily.     valACYclovir  (VALTREX ) 1000 MG tablet Take 2 tablets (2000mg ) twice a day for one day for fever blister flare. May repeat course for future flares. 12 tablet 2   venlafaxine  XR (EFFEXOR -XR) 37.5 MG 24 hr capsule TAKE 1 CAPSULE BY MOUTH DAILY WITH BREAKFAST 90 capsule 0   No current facility-administered  medications on file prior to visit.      Assessment/Plan:   Per review of [employer], for her insurance plan, tier 3 medications (preferred brand) are 25% coinsurance   Based on reported income, patient does not meet criteria for patient assistance for Breztri  from manufacturer  Collaborate with CPhT Trenton Frock to run test claims for alternative inhaler and nebulizer treatment options for patient. Follow up with patient and PCP to review options.  - Generic Advair Diskus inhaler is covered  as a tier 2 option for patient. PCP sends prescription to pharmacy  CPP sends renewal of albuterol  MDI inhaler to pharmacy for patient as requested  COPD: - Reviewed appropriate inhaler technique, including importance of rinsing and spitting out after each use of her maintenance inhaler (Breztri  or generic Advair) - Counsel patient on how to use generic Advair Diskus and also send link for "how to use" video for this inhaler to patient via MyChart as requested    Follow Up Plan: Clinical Pharmacist will follow up with patient by telephone on 08/29/2023 at 2:30 PM   Arthur Lash, PharmD, Becky Bowels, CPP Clinical Pharmacist [facility] [phone]

## 2023-08-05 NOTE — Telephone Encounter (Signed)
 Pharmacy Patient Advocate Encounter  Insurance verification completed.   The patient is insured through [employer] claim for [employer] . Currently a quantity of 60 is a 30 day supply and the co-pay is $109 .   This test claim was processed through [facility]- copay amounts may vary at other pharmacies due to pharmacy/plan contracts, or as the patient moves through the different stages of their insurance plan.

## 2023-08-09 ENCOUNTER — Other Ambulatory Visit: Payer: Self-pay | Admitting: Family Medicine

## 2023-08-09 DIAGNOSIS — Z1231 Encounter for screening mammogram for malignant neoplasm of breast: Secondary | ICD-10-CM

## 2023-08-10 ENCOUNTER — Telehealth: Payer: Self-pay

## 2023-08-10 NOTE — Telephone Encounter (Signed)
 RN called [phone]. Kelly Weber states they see nothing on their end regarding Bevespi and Anoro.   Copied from CRM [id number]. Topic: Clinical - Prescription Issue >> Aug 10, 2023  1:23 PM Kelly Weber wrote: Reason for CRM: Kelly Weber is calling waiting to get patient on bebespi or  anoro please call [employer] back [phone]

## 2023-08-11 NOTE — Telephone Encounter (Signed)
 Spoke with patient, she will finish her sample of Breztri  soon and start the generic Advair. She is happy with current treatment plans.  Didn't call [employer] back

## 2023-08-11 NOTE — Telephone Encounter (Signed)
 Could you call [employer] back as requested to follow up on this issue?  She was working with Timoteo Force on checking cost of various inhalers such as Bevespi / Anoro / Breztri  etc. From the notes, all of the test claims that our pharmacy team checked on were too high price for her.  Instead the patient agreed to trial generic Advair inhaler. This one was ordered.  So I don't think we need to order these other inhalers anymore, unless the patient is requesting a new order. Next we would get [employer]ote[employer] Force involved again if needed.  Kelly Friend, DO [facility] 08/11/2023, 2:05 PM

## 2023-08-29 ENCOUNTER — Encounter: Payer: Self-pay | Admitting: Pharmacist

## 2023-08-29 ENCOUNTER — Other Ambulatory Visit: Admitting: Pharmacist

## 2023-08-29 DIAGNOSIS — J432 Centrilobular emphysema: Secondary | ICD-10-CM

## 2023-08-29 NOTE — Progress Notes (Signed)
 08/29/2023 Name: Kelly Weber MRN: [id number] DOB: [birth year]  Chief Complaint  Patient presents with   Medication Assistance    Kelly Weber is a 78 y.o. year old female who presented for a telephone visit.   They were referred to the pharmacist by their PCP for assistance in managing medication access.      Subjective:   Care Team: Primary Care Provider: Raina Bunting, DO ; Next Scheduled Visit: 01/20/2024 Endocrinologist Tyron Gallon, MD; Next Scheduled Visit: 01/16/2024   Medication Access/Adherence  Current Pharmacy:  [facility] [id number] [place], [place] - [address] [place] [address] Phone: [phone] Fax: [phone]   Patient reports affordability concerns with their medications: Yes  Patient reports access/transportation concerns to their pharmacy: No  Patient reports adherence concerns with their medications:  No       COPD:   Current medications: - Breztri  inhaler - 2 puffs twice daily (taking from sample/previous supply) - albuterol  nebulizer solution - 1 vial via nebulizer every 4 hours as needed for wheezing/shortness of breath - albuterol  HFA inhaler - 2 puffs every 6 hours as needed for wheezing or shortness of breath   Reports picked up generic fluticasone -salmeterol inhaler and reviewed "how to use" video, but has not yet started using as still had a supply of Breztri  remaining     Objective:  Lab Results  Component Value Date   CREATININE 0.84 01/06/2023   BUN 11 01/06/2023   NA 141 01/06/2023   K 4.4 01/06/2023   CL 106 01/06/2023   CO2 28 01/06/2023   Current Outpatient Medications on File Prior to Visit  Medication Sig Dispense Refill   albuterol  (PROVENTIL ) (2.5 MG/3ML) 0.083% nebulizer solution Take 3 mLs (2.5 mg total) by nebulization every 4 (four) hours as needed for wheezing or shortness of breath. 75 mL 3   albuterol  (VENTOLIN  HFA) 108 (90 Base) MCG/ACT inhaler  Inhale 2 puffs into the lungs every 6 (six) hours as needed for wheezing or shortness of breath. 6.7 g 1   amLODipine  (NORVASC ) 5 MG tablet TAKE 1 TABLET BY MOUTH DAILY 90 tablet 0   azelastine  (ASTELIN ) 0.1 % nasal spray Place 2 sprays into both nostrils 2 (two) times daily. Use in each nostril as directed 90 mL 3   baclofen  (LIORESAL ) 10 MG tablet Take 0.5-1 tablets (5-10 mg total) by mouth 3 (three) times daily as needed for muscle spasms. 30 each 1   Calcium  Carbonate-Vit D-Min (CALCIUM  1200 PO) Take by mouth daily.     Ciclopirox  1 % shampoo 2-3 times per week lather on scalp, leave on 5-10 minutes, rinse out 120 mL 3   doxycycline  (MONODOX ) 100 MG capsule Take 1 capsule (100 mg total) by mouth daily. Take with food 30 capsule 1   fluocinonide  (LIDEX ) 0.05 % external solution Apply 1-2 times daily to affected areas on scalp as needed for itching. 60 mL 2   fluticasone -salmeterol (ADVAIR) 250-50 MCG/ACT AEPB Inhale 1 puff into the lungs in the morning and at bedtime. 60 each 12   HYDROcodone -acetaminophen  (NORCO/VICODIN) 5-325 MG tablet Take 1 tablet by mouth every 6 (six) hours as needed for moderate pain (pain score 4-6). 20 tablet 0   phentermine (ADIPEX-P) 37.5 MG tablet Take 37.5 mg by mouth daily before breakfast.     TURMERIC PO Take by mouth daily.     valACYclovir  (VALTREX ) 1000 MG tablet Take 2 tablets (2000mg ) twice a day for one  day for fever blister flare. May repeat course for future flares. 12 tablet 2   venlafaxine  XR (EFFEXOR -XR) 37.5 MG 24 hr capsule TAKE 1 CAPSULE BY MOUTH DAILY WITH BREAKFAST 90 capsule 0   No current facility-administered medications on file prior to visit.       Assessment/Plan:   Provide patient with contact information for Medicare and Seniors' [employer] Information Program [facility]) as requests support with selecting an insurance plan for next year  COPD: - Reviewed appropriate inhaler technique, including importance of rinsing and spitting  out after each use of her maintenance inhaler (Breztri  or generic Advair) - Have counseled patient on how to use generic Advair Diskus. Patient plans to make this change once uses up current supply of Breztri        Follow Up Plan:   Patient denies further medication questions or concerns today Provide patient with contact information for clinic pharmacist to contact if needed in future for medication questions/concerns    Arthur Lash, PharmD, Becky Bowels, CPP Clinical Pharmacist [facility] [phone]

## 2023-08-31 NOTE — Patient Instructions (Signed)
 You can find more information about the [employer] and Seniors' [employer] Information Program [facility]) here:   [email or web address]   You can reach the [employer] for [facility] by calling:   Idella Major [facility] [address]     [place]  [id number] [phone]     Ask for help with [facility]    Thank you!   Arthur Lash, PharmD, Becky Bowels, CPP Clinical Pharmacist [facility] [phone]

## 2023-09-06 ENCOUNTER — Other Ambulatory Visit: Payer: Self-pay | Admitting: Family Medicine

## 2023-09-06 DIAGNOSIS — F3342 Major depressive disorder, recurrent, in full remission: Secondary | ICD-10-CM

## 2023-09-08 ENCOUNTER — Other Ambulatory Visit: Payer: Self-pay

## 2023-09-08 DIAGNOSIS — F3342 Major depressive disorder, recurrent, in full remission: Secondary | ICD-10-CM

## 2023-09-08 MED ORDER — VENLAFAXINE HCL ER 37.5 MG PO CP24: 37.5000 mg | ORAL_CAPSULE | Freq: Every day | ORAL | 0 refills | Status: DC

## 2023-09-08 NOTE — Telephone Encounter (Signed)
 Refilled 09/08/23 # 90. Requested Prescriptions  Refused Prescriptions Disp Refills   venlafaxine  XR (EFFEXOR -XR) 37.5 MG 24 hr capsule [Pharmacy Med Name: VENLAFAXINE  HCL ER 37.5 MG CAP] 90 capsule 0    Sig: TAKE 1 CAPSULE BY MOUTH DAILY WITH BREAKFAST     Psychiatry: Antidepressants - SNRI - desvenlafaxine & venlafaxine  Failed - 09/08/2023  1:40 PM      Failed - Last BP in normal range    BP Readings from Last 1 Encounters:  04/26/23 (!) 140/88         Failed - Lipid Panel in normal range within the last 12 months    Cholesterol, Total  Date Value Ref Range Status  05/29/2018 133 100 - 199 mg/dL Final   Cholesterol  Date Value Ref Range Status  01/06/2023 194 <200 mg/dL Final  50/12/3816 299 0 - 200 mg/dL Final   Ldl Cholesterol, Calc  Date Value Ref Range Status  02/14/2014 113 (H) 0 - 100 mg/dL Final   LDL Cholesterol (Calc)  Date Value Ref Range Status  01/06/2023 113 (H) mg/dL (calc) Final    Comment:    Reference range: <100 . Desirable range <100 mg/dL for primary prevention;   <70 mg/dL for patients with CHD or diabetic patients  with > or = 2 CHD risk factors. Aaron Aas LDL-C is now calculated using the Martin-Hopkins  calculation, which is a validated novel method providing  better accuracy than the Friedewald equation in the  estimation of LDL-C.  Melinda Sprawls et al. Erroll Heard. 3716;967(89): 2061-2068  (http://education.QuestDiagnostics.com/faq/FAQ164)    HDL Cholesterol  Date Value Ref Range Status  02/14/2014 55 40 - 60 mg/dL Final   HDL  Date Value Ref Range Status  01/06/2023 57 > OR = 50 mg/dL Final  38/01/1750 60 >02 mg/dL Final   Triglycerides  Date Value Ref Range Status  01/06/2023 127 <150 mg/dL Final  58/52/7782 423 0 - 200 mg/dL Final         Passed - Cr in normal range and within 360 days    Creat  Date Value Ref Range Status  01/06/2023 0.84 0.60 - 1.00 mg/dL Final         Passed - Completed PHQ-2 or PHQ-9 in the last 360 days      Passed -  Valid encounter within last 6 months    Recent Outpatient Visits   None     Future Appointments             In 5 days Artemio Larry, MD [facility]   In 4 months Romeo Co, Kayleen Party, DO [facility], [facility]

## 2023-09-13 ENCOUNTER — Ambulatory Visit: Admitting: Dermatology

## 2023-09-18 ENCOUNTER — Other Ambulatory Visit: Payer: Self-pay

## 2023-09-18 ENCOUNTER — Emergency Department

## 2023-09-18 ENCOUNTER — Emergency Department
Admission: EM | Admit: 2023-09-18 | Discharge: 2023-09-18 | Disposition: A | Discharge status: Home / Self Care | Attending: Emergency Medicine | Admitting: Emergency Medicine

## 2023-09-18 DIAGNOSIS — R058 Other specified cough: Secondary | ICD-10-CM | POA: Diagnosis not present

## 2023-09-18 DIAGNOSIS — J40 Bronchitis, not specified as acute or chronic: Secondary | ICD-10-CM | POA: Diagnosis not present

## 2023-09-18 DIAGNOSIS — R0602 Shortness of breath: Secondary | ICD-10-CM | POA: Diagnosis not present

## 2023-09-18 DIAGNOSIS — J449 Chronic obstructive pulmonary disease, unspecified: Secondary | ICD-10-CM | POA: Insufficient documentation

## 2023-09-18 DIAGNOSIS — R059 Cough, unspecified: Secondary | ICD-10-CM | POA: Diagnosis present

## 2023-09-18 LAB — BASIC METABOLIC PANEL WITH GFR
Anion gap: 9 (ref 5–15)
BUN: 11 mg/dL (ref 8–23)
CO2: 23 mmol/L (ref 22–32)
Calcium: 8.4 mg/dL — ABNORMAL LOW (ref 8.9–10.3)
Chloride: 106 mmol/L (ref 98–111)
Creatinine, Ser: 0.82 mg/dL (ref 0.44–1.00)
GFR, Estimated: >60 mL/min (ref >60)
Glucose, Bld: 94 mg/dL (ref 70–99)
Potassium: 3.8 mmol/L (ref 3.5–5.1)
Sodium: 138 mmol/L (ref 135–145)

## 2023-09-18 LAB — CBC
HCT: 34.9 % — ABNORMAL LOW (ref 36.0–46.0)
Hemoglobin: 11.2 g/dL — ABNORMAL LOW (ref 12.0–15.0)
MCH: 28.4 pg (ref 26.0–34.0)
MCHC: 32.1 g/dL (ref 30.0–36.0)
MCV: 88.4 fL (ref 80.0–100.0)
Platelets: 263 10*3/uL (ref 150–400)
RBC: 3.95 MIL/uL (ref 3.87–5.11)
RDW: 14.9 % (ref 11.5–15.5)
WBC: 5.9 10*3/uL (ref 4.0–10.5)
nRBC: 0 % (ref 0.0–0.2)

## 2023-09-18 LAB — RESP PANEL BY RT-PCR (RSV, FLU A&B, COVID)  RVPGX2
Influenza A by PCR: NEGATIVE
Influenza B by PCR: NEGATIVE
Resp Syncytial Virus by PCR: NEGATIVE
SARS Coronavirus 2 by RT PCR: NEGATIVE

## 2023-09-18 MED ORDER — IPRATROPIUM-ALBUTEROL 0.5-2.5 (3) MG/3ML IN SOLN: 3.0000 mL | Freq: Four times a day (QID) | RESPIRATORY_TRACT | 0 refills | Status: DC | PRN

## 2023-09-18 MED ORDER — IPRATROPIUM-ALBUTEROL 0.5-2.5 (3) MG/3ML IN SOLN
3.0000 mL | Freq: Once | RESPIRATORY_TRACT | Status: AC
  Administered 2023-09-18: 3 mL via RESPIRATORY_TRACT
  Filled 2023-09-18: qty 3

## 2023-09-18 MED ORDER — PREDNISONE 20 MG PO TABS
60.0000 mg | ORAL_TABLET | Freq: Once | ORAL | Status: AC
  Administered 2023-09-18: 60 mg via ORAL
  Filled 2023-09-18: qty 3

## 2023-09-18 MED ORDER — PREDNISONE 50 MG PO TABS: 50.0000 mg | ORAL_TABLET | Freq: Every day | ORAL | 0 refills | Status: DC

## 2023-09-18 NOTE — ED Provider Notes (Signed)
 [facility] Provider Note    Event Date/Time   First MD Initiated Contact with Patient 09/18/23 1202     (approximate)   History   Cough   HPI  CARSYNN BETHUNE is a 78 y.o. female with a history of COPD who presents with complaints of cough for nearly 2 weeks.  Nonproductive.  No chest pain or pleurisy.  No fevers reported.  No calf pain or swelling     Physical Exam   Triage Vital Signs: ED Triage Vitals  Encounter Vitals Group     BP 09/18/23 1131 (!) 141/83     Systolic BP Percentile --      Diastolic BP Percentile --      Pulse Rate 09/18/23 1131 77     Resp 09/18/23 1131 16     Temp 09/18/23 1131 98 F (36.7 C)     Temp Source 09/18/23 1131 Oral     SpO2 09/18/23 1131 100 %     Weight 09/18/23 1129 98.9 kg (218 lb)     Height 09/18/23 1129 1.626 m (5\' 4" )     Head Circumference --      Peak Flow --      Pain Score 09/18/23 1126 0     Pain Loc --      Pain Education --      Exclude from Growth Chart --     Most recent vital signs: Vitals:   09/18/23 1131  BP: (!) 141/83  Pulse: 77  Resp: 16  Temp: 98 F (36.7 C)  SpO2: 100%     General: Awake, no distress.  CV:  Good peripheral perfusion.  Resp:  Normal effort.  Scattered mild wheezes, overall reassuring exam Abd:  No distention.  Other:  No calf pain swelling or edema   ED Results / Procedures / Treatments   Labs (all labs ordered are listed, but only abnormal results are displayed) Labs Reviewed  BASIC METABOLIC PANEL WITH GFR - Abnormal; Notable for the following components:      Result Value   Calcium  8.4 (*)    All other components within normal limits  CBC - Abnormal; Notable for the following components:   Hemoglobin 11.2 (*)    HCT 34.9 (*)    All other components within normal limits  RESP PANEL BY RT-PCR (RSV, FLU A&B, COVID)  [id number]     EKG     RADIOLOGY Chest x-ray viewed to read by me, no acute abnormality    PROCEDURES:  Critical Care  performed:   Procedures   MEDICATIONS ORDERED IN ED: Medications  ipratropium-albuterol  (DUONEB) 0.5-2.5 (3) MG/3ML nebulizer solution 3 mL (3 mLs Nebulization Given 09/18/23 1246)  ipratropium-albuterol  (DUONEB) 0.5-2.5 (3) MG/3ML nebulizer solution 3 mL (3 mLs Nebulization Given 09/18/23 1246)  predniSONE  (DELTASONE ) tablet 60 mg (60 mg Oral Given 09/18/23 1246)     IMPRESSION / MDM / ASSESSMENT AND PLAN / ED COURSE  I reviewed the triage vital signs and the nursing notes. Patient's presentation is most consistent with acute illness / injury with system symptoms.  Patient presents with cough as above, differential includes pneumonia, bronchitis, COPD exacerbation  Chest x-ray is negative for pneumonia, lab work is reassuring, exam is most consistent with bronchitis, will treat with DuoNebs, p.o. prednisone   Patient felt better after treatment, appropriate for discharge at this time, no indication for admission, close outpatient follow-up recommended, she agrees to this plan.      FINAL CLINICAL IMPRESSION(S) /  ED DIAGNOSES   Final diagnoses:  Bronchitis     Rx / DC Orders   ED Discharge Orders          Ordered    predniSONE  (DELTASONE ) 50 MG tablet  Daily with breakfast        09/18/23 1305    ipratropium-albuterol  (DUONEB) 0.5-2.5 (3) MG/3ML SOLN  Every 6 hours PRN        09/18/23 1305             Note:  This document was prepared using Dragon voice recognition software and may include unintentional dictation errors.   Bryson Carbine, MD 09/18/23 1332

## 2023-09-18 NOTE — ED Triage Notes (Signed)
 To ED POV for cough since 1.5 weeks. Hx COPD and bronchitis. States mucous is yellow and green. Respirations are unlabored, skin dry.

## 2023-09-20 ENCOUNTER — Telehealth: Payer: Self-pay

## 2023-09-20 NOTE — Telephone Encounter (Signed)
 Copied from CRM [id number]. Topic: Clinical - Medication Question >> Sep 20, 2023  8:52 AM Carlatta H wrote: Reason for CRM: Please call the patient to advise if she can take Nyquil at night with other medications

## 2023-09-20 NOTE — Telephone Encounter (Signed)
 Patient notified, ok to take nyquil

## 2023-09-20 NOTE — Telephone Encounter (Signed)
 Yes it is okay. I see no interactions if she took NyQuil

## 2023-09-22 ENCOUNTER — Telehealth: Payer: Self-pay

## 2023-09-22 ENCOUNTER — Ambulatory Visit: Payer: Self-pay

## 2023-09-22 DIAGNOSIS — J441 Chronic obstructive pulmonary disease with (acute) exacerbation: Secondary | ICD-10-CM

## 2023-09-22 MED ORDER — HYDROCOD POLI-CHLORPHE POLI ER 10-8 MG/5ML PO SUER
5.0000 mL | Freq: Two times a day (BID) | ORAL | 0 refills | Status: AC | PRN
Start: 2023-09-22 — End: ?

## 2023-09-22 MED ORDER — DOXYCYCLINE HYCLATE 100 MG PO TABS: 100.0000 mg | ORAL_TABLET | Freq: Two times a day (BID) | ORAL | 0 refills | Status: DC

## 2023-09-22 NOTE — Telephone Encounter (Signed)
 Spoke with patient, offered her an appointment for Monday. She declined. She stated that if Dr. Linnell Richardson can't see her when she's sick she doesn't need him and to cancel all of her appointments. I have not cancelled her. I tried again to schedule her and she declined. She was very upset. Kept stating that she is not going on every street to be seen.    This copied from a secure chat with Dr. Romeo Co Do you know if this situation is still just FYI or is she still needing our attention today? I see the phone calls she asked to be seen ASAP today tomorrow. We have nothing available. She was just in ED 09/18/23. It does not sound urgent. it is reasonable to wait to Monday. I know that is not what she wants to hear, but it is the best we can do.    Okay thank you. let me know. we only have the one same day acute apt for Monday. I hate to use it for a cough that will probably be better. Only other scenario I can think of is find out what she is requesting or needing. I assume she is asking for antibiotics. I would consider treating and she can follow-up next week to prevent her from going back to urgent care. That is about the only other way to do it.

## 2023-09-22 NOTE — Telephone Encounter (Signed)
 Copied from CRM [id number]. Topic: Clinical - Medical Advice >> Sep 22, 2023 10:15 AM Carlatta H wrote: Reason for CRM: Patient called very upset and would like a call from PCP [facility] [place]os//

## 2023-09-22 NOTE — Addendum Note (Signed)
 Addended by: Raina Bunting on: 09/22/2023 05:22 PM   Modules accepted: Orders

## 2023-09-22 NOTE — Telephone Encounter (Signed)
 Spoke with patient, explained to her that we have no openings today. Dr Romeo Co is not in the office tomorrow. Offered an appointment for Monday. Patient declined. She stated she would figure something else out.

## 2023-09-22 NOTE — Telephone Encounter (Signed)
 FYI Only or Action Required?: Action required by provider  Patient was last seen in primary care on 04/26/2023 by Raina Bunting, DO. Called Nurse Triage reporting Nasal Congestion and Cough. Symptoms began several weeks ago. Interventions attempted: Prescription medications: prednisone  and albuterol . Symptoms are: stable.  Triage Disposition: See Physician Within 24 Hours  Patient/caregiver understands and will follow disposition?:                                   Copied From CRM [phone]. Reason for Triage: Sinus drainage.Aaron Aas getting choked up on it .Aaron Aas Coughing up mucus.Aaron Aas looking to be seen tomorrow?  Reason for Disposition  [1] Continuous (nonstop) coughing interferes with work or school AND [2] no improvement using cough treatment per Care Advice  Answer Assessment - Initial Assessment Questions 1. ONSET: When did the cough begin?      1-2 weeks ago, went to hospital for bronchitis on 09/18/23 2. SEVERITY: How bad is the cough today?      States cough is keeping her from sleeping  3. SPUTUM: Describe the color of your sputum (none, dry cough; clear, white, yellow, green)     Green 5. DIFFICULTY BREATHING: Are you having difficulty breathing? If Yes, ask: How bad is it? (e.g., mild, moderate, severe)    - MILD: No SOB at rest, mild SOB with walking, speaks normally in sentences, can lie down, no retractions, pulse < 100.    - MODERATE: SOB at rest, SOB with minimal exertion and prefers to sit, cannot lie down flat, speaks in phrases, mild retractions, audible wheezing, pulse 100-120.    - SEVERE: Very SOB at rest, speaks in single words, struggling to breathe, sitting hunched forward, retractions, pulse > 120      Denies difficulty breathing 6. FEVER: Do you have a fever? If Yes, ask: What is your temperature, how was it measured, and when did it start?     Denies 7. CARDIAC HISTORY: Do you have any history of heart disease? (e.g.,  heart attack, congestive heart failure)      Denies 8. LUNG HISTORY: Do you have any history of lung disease?  (e.g., pulmonary embolus, asthma, emphysema)     Denies 9. PE RISK FACTORS: Do you have a history of blood clots? (or: recent major surgery, recent prolonged travel, bedridden)     Denies 10. OTHER SYMPTOMS: Do you have any other symptoms? (e.g., runny nose, wheezing, chest pain)     Sinus congestion and drainage, denies sore throat  Protocols used: Cough - Acute Productive-A-AH

## 2023-09-22 NOTE — Telephone Encounter (Signed)
 Patient has been contacted back and I reviewed her concerns with her after she spoke with our clinical staff.  I offered to order medication given she was already seen in ED and could not access other methods of care and our next soonest was Monday after I am not in office tomorrow.  Sent rx Doxycycline  TWICE A DAY x 10 day and Cough Syrup.  She was instructed to be seen sooner again at ED UC if not improve or severe changes or follow up with us  next week.  Domingo Friend, DO [facility] 09/22/2023, 5:22 PM

## 2023-09-24 ENCOUNTER — Emergency Department: Admission: EM | Admit: 2023-09-24 | Discharge: 2023-09-24 | Disposition: A | Discharge status: Home / Self Care

## 2023-09-24 ENCOUNTER — Emergency Department

## 2023-09-24 ENCOUNTER — Other Ambulatory Visit: Payer: Self-pay

## 2023-09-24 DIAGNOSIS — R059 Cough, unspecified: Secondary | ICD-10-CM | POA: Diagnosis not present

## 2023-09-24 DIAGNOSIS — R600 Localized edema: Secondary | ICD-10-CM

## 2023-09-24 DIAGNOSIS — R2243 Localized swelling, mass and lump, lower limb, bilateral: Secondary | ICD-10-CM | POA: Diagnosis not present

## 2023-09-24 DIAGNOSIS — R0602 Shortness of breath: Secondary | ICD-10-CM | POA: Diagnosis not present

## 2023-09-24 DIAGNOSIS — J441 Chronic obstructive pulmonary disease with (acute) exacerbation: Secondary | ICD-10-CM | POA: Diagnosis not present

## 2023-09-24 DIAGNOSIS — R918 Other nonspecific abnormal finding of lung field: Secondary | ICD-10-CM | POA: Diagnosis not present

## 2023-09-24 LAB — BASIC METABOLIC PANEL WITH GFR
Anion gap: 7 (ref 5–15)
BUN: 18 mg/dL (ref 8–23)
CO2: 28 mmol/L (ref 22–32)
Calcium: 8 mg/dL — ABNORMAL LOW (ref 8.9–10.3)
Chloride: 106 mmol/L (ref 98–111)
Creatinine, Ser: 0.97 mg/dL (ref 0.44–1.00)
GFR, Estimated: 60 mL/min — ABNORMAL LOW (ref >60)
Glucose, Bld: 85 mg/dL (ref 70–99)
Potassium: 3.5 mmol/L (ref 3.5–5.1)
Sodium: 141 mmol/L (ref 135–145)

## 2023-09-24 LAB — CBC WITH DIFFERENTIAL/PLATELET
Abs Immature Granulocytes: 0.06 10*3/uL (ref 0.00–0.07)
Basophils Absolute: 0 10*3/uL (ref 0.0–0.1)
Basophils Relative: 1 %
Eosinophils Absolute: 0.4 10*3/uL (ref 0.0–0.5)
Eosinophils Relative: 6 %
HCT: 35.6 % — ABNORMAL LOW (ref 36.0–46.0)
Hemoglobin: 11 g/dL — ABNORMAL LOW (ref 12.0–15.0)
Immature Granulocytes: 1 %
Lymphocytes Relative: 30 %
Lymphs Abs: 2 10*3/uL (ref 0.7–4.0)
MCH: 27.6 pg (ref 26.0–34.0)
MCHC: 30.9 g/dL (ref 30.0–36.0)
MCV: 89.2 fL (ref 80.0–100.0)
Monocytes Absolute: 0.6 10*3/uL (ref 0.1–1.0)
Monocytes Relative: 9 %
Neutro Abs: 3.5 10*3/uL (ref 1.7–7.7)
Neutrophils Relative %: 53 %
Platelets: 275 10*3/uL (ref 150–400)
RBC: 3.99 MIL/uL (ref 3.87–5.11)
RDW: 15.6 % — ABNORMAL HIGH (ref 11.5–15.5)
WBC: 6.6 10*3/uL (ref 4.0–10.5)
nRBC: 0 % (ref 0.0–0.2)

## 2023-09-24 LAB — RESP PANEL BY RT-PCR (RSV, FLU A&B, COVID)  RVPGX2
Influenza A by PCR: NEGATIVE
Influenza B by PCR: NEGATIVE
Resp Syncytial Virus by PCR: NEGATIVE
SARS Coronavirus 2 by RT PCR: NEGATIVE

## 2023-09-24 LAB — BRAIN NATRIURETIC PEPTIDE: B Natriuretic Peptide: 31.5 pg/mL (ref 0.0–100.0)

## 2023-09-24 MED ORDER — PREDNISONE 20 MG PO TABS: 40.0000 mg | ORAL_TABLET | Freq: Every day | ORAL | 0 refills | Status: AC

## 2023-09-24 MED ORDER — FUROSEMIDE 20 MG PO TABS: 20.0000 mg | ORAL_TABLET | Freq: Every day | ORAL | 0 refills | Status: DC

## 2023-09-24 MED ORDER — MELATONIN 1 MG PO TABS: 1.0000 mg | ORAL_TABLET | Freq: Every day | ORAL | 0 refills | Status: AC

## 2023-09-24 MED ORDER — IPRATROPIUM-ALBUTEROL 0.5-2.5 (3) MG/3ML IN SOLN
3.0000 mL | Freq: Once | RESPIRATORY_TRACT | Status: AC
  Administered 2023-09-24: 3 mL via RESPIRATORY_TRACT
  Filled 2023-09-24: qty 3

## 2023-09-24 NOTE — ED Triage Notes (Signed)
 Recent diagnosed with bronchitis. Arrives today c/o increased sinus congestion, cough, SOB and bilateral leg swelling today.  Patient is AAOx3.  Skin warm and dry.  DOE appreciated.

## 2023-09-24 NOTE — ED Provider Notes (Signed)
 [facility] Provider Note    Event Date/Time   First MD Initiated Contact with Patient 09/24/23 [phone]     (approximate)   History   Shortness of Breath  Recent diagnosed with bronchitis. Arrives today c/o increased sinus congestion, cough, SOB and bilateral leg swelling today.  Patient is AAOx3.  Skin warm and dry.  DOE appreciated.   HPI Kelly Weber is a 78 y.o. female PMH COPD/emphysema, GERD, hyperlipidemia, anxiety presents for evaluation of shortness of breath, cough, congestion - On my evaluation, patient tells me she is having worsened nasal congestion with green mucousy output and mild sensation of forehead pressure.  Also feels she is somewhat more short of breath with more cough this morning.  Says she woke up this morning and both of her ankles were much more swollen than usual, swelling is significantly improved since she woke up though - No clear orthopnea - No chest pain, pleuritic discomfort - Completed previously prescribed course of steroids, and is taking doxycycline  and using home nebulizer treatments recently  Per chart review, last seen in emergency department on 09/18/2022 for similar symptoms.  Respiratory panel negative.  CBC with no leukocytosis.  Chest x-ray negative.  Felt to have bronchitis, Rx prednisone , duo nebulizer treatment.  Subsequently had telephone encounter with her primary care provider on 09/22/2023 with similar symptoms, Rx doxycycline  and cough syrup.  Last TTE 2017: - Left ventricle: The cavity size was normal. Wall thickness was    normal. Systolic function was mildly reduced. The estimated    ejection fraction was in the range of 45% to 50%. Images were    inadequate for LV wall motion assessment. Doppler parameters are    consistent with abnormal left ventricular relaxation (grade 1    diastolic dysfunction).      Physical Exam   Triage Vital Signs: ED Triage Vitals  Encounter Vitals Group     BP 09/24/23  0750 (!) 120/104     Girls Systolic BP Percentile --      Girls Diastolic BP Percentile --      Boys Systolic BP Percentile --      Boys Diastolic BP Percentile --      Pulse Rate 09/24/23 0750 79     Resp 09/24/23 0750 17     Temp 09/24/23 0750 98.1 F (36.7 C)     Temp Source 09/24/23 0750 Oral     SpO2 09/24/23 0750 96 %     Weight 09/24/23 0739 221 lb 12.8 oz (100.6 kg)     Height --      Head Circumference --      Peak Flow --      Pain Score 09/24/23 0736 0     Pain Loc --      Pain Education --      Exclude from Growth Chart --     Most recent vital signs: Vitals:   09/24/23 0750 09/24/23 0845  BP: (!) 120/104 126/61  Pulse: 79 62  Resp: 17 11  Temp: 98.1 F (36.7 C)   SpO2: 96% 100%     General: Awake, no distress.  CV:  Good peripheral perfusion. RRR, RP 2+, mild bilateral leg edema of lower extremities to lower shin Resp:  Normal effort.  Expiratory expiratory wheezing throughout, good airflow, mildly coarse breath sounds right lower lung field Abd:  No distention. Nontender to deep palpation throughout  Bedside ultrasound with possibly mildly reduced EF, no B-lines bilateral lung fields,  appropriately variable IVC.   ED Results / Procedures / Treatments   Labs (all labs ordered are listed, but only abnormal results are displayed) Labs Reviewed  CBC WITH DIFFERENTIAL/PLATELET - Abnormal; Notable for the following components:      Result Value   Hemoglobin 11.0 (*)    HCT 35.6 (*)    RDW 15.6 (*)    All other components within normal limits  BASIC METABOLIC PANEL WITH GFR - Abnormal; Notable for the following components:   Calcium  8.0 (*)    GFR, Estimated 60 (*)    All other components within normal limits  RESP PANEL BY RT-PCR (RSV, FLU A&B, COVID)  RVPGX2  BRAIN NATRIURETIC PEPTIDE     EKG  Ecg = sinus rhythm, rate 65, no gross ST elevation or depression, no significant repolarization abnormality, normal axis, normal intervals.  No clear  evidence of ischemia nor arrhythmia on my interpretation.   RADIOLOGY Radiology interpreted by myself and radiology report reviewed.  No acute pathology.    PROCEDURES:  Critical Care performed: No  Procedures   MEDICATIONS ORDERED IN ED: Medications  ipratropium-albuterol  (DUONEB) 0.5-2.5 (3) MG/3ML nebulizer solution 3 mL (3 mLs Nebulization Given 09/24/23 0834)     IMPRESSION / MDM / ASSESSMENT AND PLAN / ED COURSE  I reviewed the triage vital signs and the nursing notes.                              DDX/MDM/AP: Differential diagnosis includes, but is not limited to, COPD exacerbation, bronchitis, considered but doubt mild CHF given reassuring ultrasound findings here and very minimal edema on exam, consider underlying pneumonia or recurrent viral illness.  Do not clinically suspect ACS, PE at this time.  Plan: - EKG - Chest x-ray - DuoNeb - Labs - Reassess  Patient's presentation is most consistent with acute presentation with potential threat to life or bodily function.  The patient is on the cardiac monitor to evaluate for evidence of arrhythmia and/or significant heart rate changes.  ED course below.  Patient feeling much better after single DuoNeb treatment.  On reevaluation, she is now much more concerned about her bilateral lower extremity edema.  Appears symmetric on my eval, mild edema, no clinical concern for DVT.  No evidence of pulmonary edema at this time.  Given prior history of reduced EF though no formal history of CHF in her chart, will start her on short course of 3 days low-dose Lasix but also recommend compression stockings and leg elevation.  Will restart her on prednisone .  Is already on doxycycline  recently prescribed by her primary care doctor--treating COPD exacerbation.  Continue DuoNebs.  Also requesting sleep aid, melatonin prescribed.  Plan for close PMD follow-up.  ED return precautions in place.  Patient and family agree with plan.  Clinical  Course as of 09/24/23 1035  Sat Sep 24, 2023  0856 CXR: IMPRESSION: Probable COPD. No active cardiopulmonary disease.     [MM]  0856 Cbc unremarkable [MM]  0956 Viral swab neg Bnp wnl [MM]    Clinical Course User Index [MM] Collis Deaner, MD     FINAL CLINICAL IMPRESSION(S) / ED DIAGNOSES   Final diagnoses:  COPD exacerbation (HCC)  Bilateral leg edema     Rx / DC Orders   ED Discharge Orders          Ordered    predniSONE  (DELTASONE ) 20 MG tablet  Daily with breakfast  09/24/23 1032    furosemide (LASIX) 20 MG tablet  Daily        09/24/23 1032    melatonin 1 MG TABS tablet  Daily at bedtime        09/24/23 1033             Note:  This document was prepared using Dragon voice recognition software and may include unintentional dictation errors.   Collis Deaner, MD 09/24/23 [phone]

## 2023-09-24 NOTE — Discharge Instructions (Addendum)
 Your evaluation in the emergency department was overall reassuring.  I do suspect you have a flare of your COPD, and I recommend you continue the doxycycline  as already prescribed by your primary care doctor as well as your nebulizer treatments.  I have restarted you on a course of steroids to take over the next 5 days.  I also started you on a pill to take off some extra fluid over the next 3 days.  I additionally prescribed you a medication to help you sleep.  Please follow-up closely with your primary care doctor for reevaluation, and return to the emergency department with any new or worsening symptoms.  I recommend wearing compressing stockings in the meantime and elevating your legs when you are sitting or laying down.

## 2023-09-27 ENCOUNTER — Encounter: Payer: Self-pay | Admitting: Family Medicine

## 2023-09-27 ENCOUNTER — Ambulatory Visit: Payer: Self-pay

## 2023-09-27 ENCOUNTER — Ambulatory Visit (INDEPENDENT_AMBULATORY_CARE_PROVIDER_SITE_OTHER): Admitting: Family Medicine

## 2023-09-27 VITALS — BP 122/64 | HR 74 | Ht 64.0 in | Wt 223.0 lb

## 2023-09-27 DIAGNOSIS — R600 Localized edema: Secondary | ICD-10-CM | POA: Diagnosis not present

## 2023-09-27 DIAGNOSIS — J441 Chronic obstructive pulmonary disease with (acute) exacerbation: Secondary | ICD-10-CM | POA: Diagnosis not present

## 2023-09-27 DIAGNOSIS — R0601 Orthopnea: Secondary | ICD-10-CM

## 2023-09-27 DIAGNOSIS — J432 Centrilobular emphysema: Secondary | ICD-10-CM | POA: Diagnosis not present

## 2023-09-27 MED ORDER — AZITHROMYCIN 250 MG PO TABS: ORAL_TABLET | ORAL | 0 refills | Status: DC

## 2023-09-27 MED ORDER — FUROSEMIDE 40 MG PO TABS: 40.0000 mg | ORAL_TABLET | Freq: Every day | ORAL | 2 refills | Status: DC | PRN

## 2023-09-27 MED ORDER — HYDROCOD POLI-CHLORPHE POLI ER 10-8 MG/5ML PO SUER: 5.0000 mL | Freq: Two times a day (BID) | ORAL | 0 refills | Status: DC | PRN

## 2023-09-27 NOTE — Progress Notes (Signed)
 Subjective:    Patient ID: Kelly Weber, female    DOB: 09/14/1945, 78 y.o.   MRN: [id number]  Kelly Weber is a 78 y.o. female presenting on 09/27/2023 for Leg Swelling and COPD   HPI  Discussed the use of AI scribe software for clinical note transcription with the patient, who gave verbal consent to proceed.  History of Present Illness   Kelly Weber is a 78 year old female with COPD who presents with worsening breathing difficulties and leg swelling.  On June 8th, she visited the ER where she received a steroid and breathing treatments. She did not improve so there was a phone call on 6/12 and we prescribed doxycycline  and cough syrup, but not a repeat of prednisone  as she had just completed a course. The doxycycline  has been helpful in reducing her symptoms, although she still experiences sinus drainage and occasional productive coughs. She also uses a nasal spray to help clear her sinuses.  She experiences persistent breathing difficulties, with a sensation of congestion in her airways due to sinus drainage. She uses a nasal spray to alleviate these symptoms, which helps clear her sinuses. Despite using a DuoNebulizer for breathing treatments, last used four hours prior to the visit, wheezing continues.  Significant swelling in her feet and ankles prompted a visit to the ER on June 14th. She was prescribed a diuretic for three days Lasix 20mg  daily, which she completed, but she did not notice an increase in urination or a reduction in swelling. She has been applying ice and elevating her legs to manage the swelling, which is uncomfortable but not painful.  She has difficulty sleeping due to coughing and breathing issues, often sleeping in a recliner to alleviate symptoms. The cough syrup, which contains codeine , helps her sleep for three to four hours at a time. She has noticed changes around her eyes, describing them as 'dented in'.          06/24/2023    3:25 PM 04/26/2023     2:09 PM 01/12/2023    3:32 PM  Depression screen PHQ 2/9  Decreased Interest 0 2 0  Down, Depressed, Hopeless 0 2 0  PHQ - 2 Score 0 4 0  Altered sleeping 0 0 0  Tired, decreased energy 0 1 1  Change in appetite 0 0 1  Feeling bad or failure about yourself  0 0 0  Trouble concentrating 0 0 0  Moving slowly or fidgety/restless 0 1 0  Suicidal thoughts 0 0 0  PHQ-9 Score 0 6 2  Difficult doing work/chores   Not difficult at all       04/26/2023    2:09 PM 01/12/2023    3:32 PM 12/03/2022    1:59 PM 06/11/2022    3:40 PM  GAD 7 : Generalized Anxiety Score  Nervous, Anxious, on Edge 0 1 1 1   Control/stop worrying 1 0 1 2  Worry too much - different things 1 1 2 2   Trouble relaxing 1 1 1 1   Restless 0 1 1 0  Easily annoyed or irritable 1 0 1 1  Afraid - awful might happen 0 0 1 1  Total GAD 7 Score 4 4 8 8   Anxiety Difficulty   Somewhat difficult     Social History   Tobacco Use   Smoking status: Former    Current packs/day: 0.00    Average packs/day: 1 pack/day for 30.0 years (30.0 ttl pk-yrs)    Types:  Cigarettes    Start date: 01/20/1984    Quit date: 01/19/2014    Years since quitting: 9.6   Smokeless tobacco: Former  Building services engineer status: Never Used  Substance Use Topics   Alcohol use: No    Alcohol/week: 0.0 standard drinks of alcohol   Drug use: No    Review of Systems Per HPI unless specifically indicated above     Objective:    BP 122/64 (BP Location: Left Arm, Patient Position: Sitting, Cuff Size: Large)   Pulse 74   Ht 5' 4 (1.626 m)   Wt 223 lb (101.2 kg)   SpO2 95%   BMI 38.28 kg/m   Wt Readings from Last 3 Encounters:  09/27/23 223 lb (101.2 kg)  09/24/23 221 lb 12.8 oz (100.6 kg)  09/18/23 218 lb (98.9 kg)    Physical Exam Vitals and nursing note reviewed.  Constitutional:      General: She is not in acute distress.    Appearance: She is well-developed. She is not diaphoretic.     Comments: Well-appearing, comfortable,  cooperative  HENT:     Head: Normocephalic and atraumatic.   Eyes:     General:        Right eye: No discharge.        Left eye: No discharge.     Conjunctiva/sclera: Conjunctivae normal.   Neck:     Thyroid : No thyromegaly.   Cardiovascular:     Rate and Rhythm: Normal rate and regular rhythm.     Heart sounds: Normal heart sounds. No murmur heard. Pulmonary:     Effort: Pulmonary effort is normal. No respiratory distress.     Breath sounds: Wheezing present. No rales.   Musculoskeletal:        General: Normal range of motion.     Cervical back: Normal range of motion and neck supple.     Right lower leg: Edema (+1 mild bilateral symmetrical, non tender) present.     Left lower leg: Edema present.  Lymphadenopathy:     Cervical: No cervical adenopathy.   Skin:    General: Skin is warm and dry.     Findings: No erythema or rash.   Neurological:     Mental Status: She is alert and oriented to person, place, and time.   Psychiatric:        Behavior: Behavior normal.     Comments: Well groomed, good eye contact, normal speech and thoughts     I have personally reviewed the radiology report from 09/24/23 CXR.  CLINICAL DATA:  Shortness of breath. Cough. Lower extremity swelling.   EXAM: CHEST - 2 VIEW   COMPARISON:  09/18/2023   FINDINGS: The heart size and mediastinal contours are within normal limits. Pulmonary hyperinflation again seen, suspicious for COPD. Both lungs are clear. The visualized skeletal structures are unremarkable.   IMPRESSION: Probable COPD. No active cardiopulmonary disease.     Electronically Signed   By: Marlyce Sine M.D.   On: 09/24/2023 08:36  Results for orders placed or performed during the hospital encounter of 09/24/23  Resp panel by RT-PCR (RSV, Flu A&B, Covid) Anterior Nasal Swab   Collection Time: 09/24/23  8:27 AM   Specimen: Anterior Nasal Swab  Result Value Ref Range   SARS Coronavirus 2 by RT PCR NEGATIVE NEGATIVE    Influenza A by PCR NEGATIVE NEGATIVE   Influenza B by PCR NEGATIVE NEGATIVE   Resp Syncytial Virus by PCR NEGATIVE NEGATIVE  CBC  with Differential   Collection Time: 09/24/23  8:27 AM  Result Value Ref Range   WBC 6.6 4.0 - 10.5 K/uL   RBC 3.99 3.87 - 5.11 MIL/uL   Hemoglobin 11.0 (L) 12.0 - 15.0 g/dL   HCT 29.5 (L) 62.1 - 30.8 %   MCV 89.2 80.0 - 100.0 fL   MCH 27.6 26.0 - 34.0 pg   MCHC 30.9 30.0 - 36.0 g/dL   RDW 65.7 (H) 84.6 - 96.2 %   Platelets 275 150 - 400 K/uL   nRBC 0.0 0.0 - 0.2 %   Neutrophils Relative % 53 %   Neutro Abs 3.5 1.7 - 7.7 K/uL   Lymphocytes Relative 30 %   Lymphs Abs 2.0 0.7 - 4.0 K/uL   Monocytes Relative 9 %   Monocytes Absolute 0.6 0.1 - 1.0 K/uL   Eosinophils Relative 6 %   Eosinophils Absolute 0.4 0.0 - 0.5 K/uL   Basophils Relative 1 %   Basophils Absolute 0.0 0.0 - 0.1 K/uL   Immature Granulocytes 1 %   Abs Immature Granulocytes 0.06 0.00 - 0.07 K/uL  Basic metabolic panel   Collection Time: 09/24/23  8:27 AM  Result Value Ref Range   Sodium 141 135 - 145 mmol/L   Potassium 3.5 3.5 - 5.1 mmol/L   Chloride 106 98 - 111 mmol/L   CO2 28 22 - 32 mmol/L   Glucose, Bld 85 70 - 99 mg/dL   BUN 18 8 - 23 mg/dL   Creatinine, Ser 9.52 0.44 - 1.00 mg/dL   Calcium  8.0 (L) 8.9 - 10.3 mg/dL   GFR, Estimated 60 (L) >60 mL/min   Anion gap 7 5 - 15  Brain natriuretic peptide   Collection Time: 09/24/23  8:27 AM  Result Value Ref Range   B Natriuretic Peptide 31.5 0.0 - 100.0 pg/mL      Assessment & Plan:   Problem List Items Addressed This Visit     Centrilobular emphysema (HCC)   Relevant Medications   chlorpheniramine-HYDROcodone  (TUSSIONEX) 10-8 MG/5ML (Start on 09/29/2023)   azithromycin  (ZITHROMAX  Z-PAK) 250 MG tablet   Other Relevant Orders   Ambulatory referral to Pulmonology   Other Visit Diagnoses       Bilateral lower extremity edema    -  Primary   Relevant Medications   furosemide (LASIX) 40 MG tablet   Other Relevant Orders    ECHOCARDIOGRAM COMPLETE     COPD with acute exacerbation (HCC)       Relevant Medications   chlorpheniramine-HYDROcodone  (TUSSIONEX) 10-8 MG/5ML (Start on 09/29/2023)   azithromycin  (ZITHROMAX  Z-PAK) 250 MG tablet   Other Relevant Orders   Ambulatory referral to Pulmonology     Orthopnea       Relevant Medications   furosemide (LASIX) 40 MG tablet   Other Relevant Orders   ECHOCARDIOGRAM COMPLETE         COPD exacerbation, persistent vs recurrent Recent course with 2 weeks with bronchitis / COPD flares, has had multiple treatments and evaluations already with ED visits, antibiotic courses and steroids.  Improved on Doxycycline  and Prednisone , Duonebs, and Cough syrup, but still wheezing significantly and now worse leg swelling.  ED visit 6/14 mostly negative work up, including BNP and labs and imaging CXR, consistent with COPD, they gave her more Prednisone  taper.  Experiencing exacerbation with wheezing and dyspnea. Sinus drainage and fluid overload may contribute. Steroids beneficial but may worsen fluid retention.  - Continue DuoNebulizer treatments. - Refer to pulmonology given history  of COPD with recurrent flares. We have attempted to maintain her treatment with Advair and other inhalers in past.  - For Acute symptoms, unresolved today - Start Azithromycin  Z pak (antibiotic) 2 tabs day 1, then 1 tab x 4 days, complete entire course even if improved - Reorder cough syrup with codeine  pick up 6/19 - Finish prednisone  course, no repeat at this time.    Peripheral edema Significant lower ext edema without pain. Suspected fluid overload possibly due to cardiac or venous issues. Previous low-dose diuretic ineffective. Work up at hospital unlikely DVT or other etiology. No sign cellulitis.  Reviewed CXR without edema, 6/14 from ED. Today has wheezing no crackles.  - Increase diuretic to 40 mg daily for 3-7 days. - Order echocardiogram for update, last was 2017 approx - RICE  therapy. Encourage compression stockings post-swelling control.         Orders Placed This Encounter  Procedures   Ambulatory referral to Pulmonology    Referral Priority:   Routine    Referral Type:   Consultation    Referral Reason:   Specialty Services Required    Requested Specialty:   Pulmonary Disease    Number of Visits Requested:   1   ECHOCARDIOGRAM COMPLETE    Standing Status:   Future    Expiration Date:   09/26/2024    Where should this test be performed:   [facility]    Please indicate who you request to read the nuc med / echo results.:   [facility]    Perflutren DEFINITY (image enhancing agent) should be administered unless hypersensitivity or allergy exist:   Administer Perflutren    Reason for exam-Echo:   Dyspnea  R06.00    Meds ordered this encounter  Medications   chlorpheniramine-HYDROcodone  (TUSSIONEX) 10-8 MG/5ML    Sig: Take 5 mLs by mouth every 12 (twelve) hours as needed.    Dispense:  115 mL    Refill:  0    First fill 09/29/23   furosemide (LASIX) 40 MG tablet    Sig: Take 1 tablet (40 mg total) by mouth daily as needed for fluid.    Dispense:  30 tablet    Refill:  2   azithromycin  (ZITHROMAX  Z-PAK) 250 MG tablet    Sig: Take 2 tabs (500mg  total) on Day 1. Take 1 tab (250mg ) daily for next 4 days.    Dispense:  6 tablet    Refill:  0    Follow up plan: Return if symptoms worsen or fail to improve.  Domingo Friend, DO [facility] 09/27/2023, 10:59 AM

## 2023-09-27 NOTE — Patient Instructions (Addendum)
 Thank you for coming to the office today.  Swelling today it can be from vein insufficiency pooling of fluid.  Try the stronger furosemide fluid pill diuretic 40mg  daily for 3-7 days to help urinate out extra fluid. This may help the breathing  We will order a new ECHO heart scan to check heart function  Start Azithromycin  Z pak (antibiotic) 2 tabs day 1, then 1 tab x 4 days, complete entire course even if improved  No more prednisone  at this time.  Referral to [facility] Doctors for COPD  [facility] [address] [place],  [place]  [address] Main: [phone]  Re order cough syrup pick up on Thursday 6/19  Keep using Duoneb breathing treatment.  Please schedule a Follow-up Appointment to: Return if symptoms worsen or fail to improve.  If you have any other questions or concerns, please feel free to call the office or send a message through MyChart. You may also schedule an earlier appointment if necessary.  Additionally, you may be receiving a survey about your experience at our office within a few days to 1 week by e-mail or mail. We value your feedback.  Domingo Friend, DO [facility] [place]

## 2023-09-27 NOTE — Telephone Encounter (Signed)
 FYI Only or Action Required?: FYI only for provider  Patient was last seen in primary care on 04/26/2023 by Raina Bunting, DO. Called Nurse Triage reporting Leg Swelling. Symptoms began chronic. Interventions attempted: Rest, hydration, or home remedies. Symptoms are: unchanged.  Triage Disposition: See Physician Within 24 Hours  Patient/caregiver understands and will follow disposition?: Yes, but will wait   Copied from CRM [id number]. Topic: Clinical - Red Word Triage >> Sep 27, 2023  9:40 AM Kelly Weber T wrote: Red Word that prompted transfer to Nurse Triage: patient stated both her legs and feet are swollen double in size. She says they feel like they could explode. Reason for Disposition  [1] MODERATE leg swelling (e.g., swelling extends up to knees) AND [2] new-onset or worsening  Answer Assessment - Initial Assessment Questions 1. ONSET: When did the swelling start? (e.g., minutes, hours, days)     Chronic 2. LOCATION: What part of the leg is swollen?  Are both legs swollen or just one leg?     Bilateral legs and feet  3. SEVERITY: How bad is the swelling? (e.g., localized; mild, moderate, severe)   - Localized: Small area of swelling localized to one leg.   - MILD pedal edema: Swelling limited to foot and ankle, pitting edema < 1/4 inch (6 mm) deep, rest and elevation eliminate most or all swelling.   - MODERATE edema: Swelling of lower leg to knee, pitting edema > 1/4 inch (6 mm) deep, rest and elevation only partially reduce swelling.   - SEVERE edema: Swelling extends above knee, facial or hand swelling present.      Moderate to knees  4. REDNESS: Does the swelling look red or infected?     Denies  5. PAIN: Is the swelling painful to touch? If Yes, ask: How painful is it?   (Scale 1-10; mild, moderate or severe)     denies 6. FEVER: Do you have a fever? If Yes, ask: What is it, how was it measured, and when did it start?      denies 7. CAUSE: What do  you think is causing the leg swelling?     ongoing 8. MEDICAL HISTORY: Do you have a history of blood clots (e.g., DVT), cancer, heart failure, kidney disease, or liver failure?     COPD, Bronchitis 9. RECURRENT SYMPTOM: Have you had leg swelling before? If Yes, ask: When was the last time? What happened that time?     Yes 10. OTHER SYMPTOMS: Do you have any other symptoms? (e.g., chest pain, difficulty breathing)       None at this time  Protocols used: Leg Swelling and Edema-A-AH

## 2023-09-27 NOTE — Telephone Encounter (Signed)
 Appt made with Dr. Romeo Co for today

## 2023-09-29 ENCOUNTER — Ambulatory Visit: Admitting: Internal Medicine

## 2023-10-03 ENCOUNTER — Encounter: Payer: Self-pay | Admitting: Internal Medicine

## 2023-10-03 ENCOUNTER — Ambulatory Visit (INDEPENDENT_AMBULATORY_CARE_PROVIDER_SITE_OTHER): Admitting: Internal Medicine

## 2023-10-03 ENCOUNTER — Ambulatory Visit: Payer: Self-pay

## 2023-10-03 VITALS — BP 124/78 | HR 92 | Ht 64.0 in | Wt 223.0 lb

## 2023-10-03 DIAGNOSIS — R600 Localized edema: Secondary | ICD-10-CM

## 2023-10-03 DIAGNOSIS — J432 Centrilobular emphysema: Secondary | ICD-10-CM

## 2023-10-03 NOTE — Patient Instructions (Signed)
 Peripheral Edema  Peripheral edema is swelling that is caused by a buildup of fluid. Peripheral edema most often affects the lower legs, ankles, and feet. It can also develop in the arms, hands, and face. The area of the body that has peripheral edema will look swollen. It may also feel heavy or warm. Your clothes may start to feel tight. Pressing on the area may make a temporary dent in your skin (pitting edema). You may not be able to move your swollen arm or leg as much as usual. There are many causes of peripheral edema. It can happen because of a complication of other conditions such as heart failure, kidney disease, or a problem with your circulation. It also can be a side effect of certain medicines or happen because of an infection. It often happens to women during pregnancy. Sometimes, the cause is not known. Follow these instructions at home: Managing pain, stiffness, and swelling  Raise (elevate) your legs while you are sitting or lying down. Move around often to prevent stiffness and to reduce swelling. Do not sit or stand for long periods of time. Do not wear tight clothing. Do not wear garters on your upper legs. Exercise your legs to get your circulation going. This helps to move the fluid back into your blood vessels, and it may help the swelling go down. Wear compression stockings as told by your health care provider. These stockings help to prevent blood clots and reduce swelling in your legs. It is important that these are the correct size. These stockings should be prescribed by your doctor to prevent possible injuries. If elastic bandages or wraps are recommended, use them as told by your health care provider. Medicines Take over-the-counter and prescription medicines only as told by your health care provider. Your health care provider may prescribe medicine to help your body get rid of excess water (diuretic). Take this medicine if you are told to take it. General  instructions Eat a low-salt (low-sodium) diet as told by your health care provider. Sometimes, eating less salt may reduce swelling. Pay attention to any changes in your symptoms. Moisturize your skin daily to help prevent skin from cracking and draining. Keep all follow-up visits. This is important. Contact a health care provider if: You have a fever. You have swelling in only one leg. You have increased swelling, redness, or pain in one or both of your legs. You have drainage or sores at the area where you have edema. Get help right away if: You have edema that starts suddenly or is getting worse, especially if you are pregnant or have a medical condition. You develop shortness of breath, especially when you are lying down. You have pain in your chest or abdomen. You feel weak. You feel like you will faint. These symptoms may be an emergency. Get help right away. Call 911. Do not wait to see if the symptoms will go away. Do not drive yourself to the hospital. Summary Peripheral edema is swelling that is caused by a buildup of fluid. Peripheral edema most often affects the lower legs, ankles, and feet. Move around often to prevent stiffness and to reduce swelling. Do not sit or stand for long periods of time. Pay attention to any changes in your symptoms. Contact a health care provider if you have edema that starts suddenly or is getting worse, especially if you are pregnant or have a medical condition. Get help right away if you develop shortness of breath, especially when lying down.  This information is not intended to replace advice given to you by your health care provider. Make sure you discuss any questions you have with your health care provider. Document Revised: 12/01/2020 Document Reviewed: 12/01/2020 Elsevier Patient Education  2024 [employer].

## 2023-10-03 NOTE — Telephone Encounter (Signed)
 Copied from CRM [id number]. Topic: Clinical - Red Word Triage >> Oct 03, 2023  8:10 AM Silvana PARAS wrote: Red Word that prompted transfer to Nurse Triage: 6/21 d/c from hospital, feet swelling due to eating fish and ice cream, chest pain. Answer Assessment - Initial Assessment Questions 1. LOCATION: Where does it hurt?       Chest hurts from cough  Answer Assessment - Initial Assessment Questions 1. ONSET: When did the swelling start? (e.g., minutes, hours, days)     This morning 2. LOCATION: What part of the leg is swollen?  Are both legs swollen or just one leg?     both 3. SEVERITY: How bad is the swelling? (e.g., localized; mild, moderate, severe)   - Localized: Small area of swelling localized to one leg.   - MILD pedal edema: Swelling limited to foot and ankle, pitting edema < 1/4 inch (6 mm) deep, rest and elevation eliminate most or all swelling.   - MODERATE edema: Swelling of lower leg to knee, pitting edema > 1/4 inch (6 mm) deep, rest and elevation only partially reduce swelling.   - SEVERE edema: Swelling extends above knee, facial or hand swelling present.      moderate 4. REDNESS: Does the swelling look red or infected?     no 5. PAIN: Is the swelling painful to touch? If Yes, ask: How painful is it?   (Scale 1-10; mild, moderate or severe)      6. FEVER: Do you have a fever? If Yes, ask: What is it, how was it measured, and when did it start?      no 7. CAUSE: What do you think is causing the leg swelling?     Unsure 8. MEDICAL HISTORY: Do you have a history of blood clots (e.g., DVT), cancer, heart failure, kidney disease, or liver failure?     Lung 9. RECURRENT SYMPTOM: Have you had leg swelling before? If Yes, ask: When was the last time? What happened that time?      10. OTHER SYMPTOMS: Do you have any other symptoms? (e.g., chest pain, difficulty breathing)        11. PREGNANCY: Is there any chance you are pregnant? When was your last  menstrual period?  Protocols used: Chest Pain-A-AH, Leg Swelling and Edema-A-AH

## 2023-10-03 NOTE — Progress Notes (Signed)
 Subjective:    Patient ID: Kelly Weber, female    DOB: 1946-01-03, 78 y.o.   MRN: [id number]  HPI  Discussed the use of AI scribe software for clinical note transcription with the patient, who gave verbal consent to proceed.   Kelly Weber is a 78 year old female with COPD who presents with persistent cough and swollen feet.  She has been hospitalized two times in the last month due to exacerbations of her COPD. During these hospitalizations, she received antibiotics, steroids and cough medicine. She uses a nebulizer every four to five hours as needed and albuterol  daily. She has not been using her daily Advair inhaler as prescribed, misunderstanding it to be for use only when needed.  She experiences a cough with sputum production but no significant shortness of breath at rest. Her feet have been swollen, and she saw her PCP 1 week ago for the same. Her Lasix  dosage was doubled for seven days, which led to some reduction in swelling. She is scheduled for an echocardiogram next month to further investigate the cause of the swelling, specifically rule out heart failure. She denies using any salt in her diet and keeps her feet elevated at home. She has tried compression hose in the past but found them uncomfortable. She is currently taking furosemide  daily to manage the swelling.   Review of Systems   Past Medical History:  Diagnosis Date   Allergies    Anxiety    Arthritis    fingers, left knee   COPD (chronic obstructive pulmonary disease) (HCC)    Depression    Diverticulitis    Dyspnea    Fatigue    GERD (gastroesophageal reflux disease)    HLD (hyperlipidemia)    Insomnia 2006   Lower back pain    Skin cancer 2007   Basal Cell CA resected from Left middle finger    Current Outpatient Medications  Medication Sig Dispense Refill   albuterol  (PROVENTIL ) (2.5 MG/3ML) 0.083% nebulizer solution Take 3 mLs (2.5 mg total) by nebulization every 4 (four) hours as needed for  wheezing or shortness of breath. 75 mL 3   albuterol  (VENTOLIN  HFA) 108 (90 Base) MCG/ACT inhaler Inhale 2 puffs into the lungs every 6 (six) hours as needed for wheezing or shortness of breath. 6.7 g 1   amLODipine  (NORVASC ) 5 MG tablet TAKE 1 TABLET BY MOUTH DAILY 90 tablet 0   azelastine  (ASTELIN ) 0.1 % nasal spray Place 2 sprays into both nostrils 2 (two) times daily. Use in each nostril as directed 90 mL 3   azithromycin  (ZITHROMAX  Z-PAK) 250 MG tablet Take 2 tabs (500mg  total) on Day 1. Take 1 tab (250mg ) daily for next 4 days. 6 tablet 0   baclofen  (LIORESAL ) 10 MG tablet Take 0.5-1 tablets (5-10 mg total) by mouth 3 (three) times daily as needed for muscle spasms. 30 each 1   Calcium  Carbonate-Vit D-Min (CALCIUM  1200 PO) Take by mouth daily.     chlorpheniramine-HYDROcodone  (TUSSIONEX) 10-8 MG/5ML Take 5 mLs by mouth every 12 (twelve) hours as needed. 115 mL 0   Ciclopirox  1 % shampoo 2-3 times per week lather on scalp, leave on 5-10 minutes, rinse out 120 mL 3   fluocinonide  (LIDEX ) 0.05 % external solution Apply 1-2 times daily to affected areas on scalp as needed for itching. 60 mL 2   fluticasone -salmeterol (ADVAIR) 250-50 MCG/ACT AEPB Inhale 1 puff into the lungs in the morning and at bedtime. 60 each 12  furosemide  (LASIX ) 40 MG tablet Take 1 tablet (40 mg total) by mouth daily as needed for fluid. 30 tablet 2   HYDROcodone -acetaminophen  (NORCO/VICODIN) 5-325 MG tablet Take 1 tablet by mouth every 6 (six) hours as needed for moderate pain (pain score 4-6). 20 tablet 0   ipratropium-albuterol  (DUONEB) 0.5-2.5 (3) MG/3ML SOLN Take 3 mLs by nebulization every 6 (six) hours as needed for up to 4 days. 360 mL 0   melatonin 1 MG TABS tablet Take 1 tablet (1 mg total) by mouth at bedtime for 14 days. 14 tablet 0   phentermine (ADIPEX-P) 37.5 MG tablet Take 37.5 mg by mouth daily before breakfast.     TURMERIC PO Take by mouth daily.     valACYclovir  (VALTREX ) 1000 MG tablet Take 2 tablets  (2000mg ) twice a day for one day for fever blister flare. May repeat course for future flares. 12 tablet 2   venlafaxine  XR (EFFEXOR -XR) 37.5 MG 24 hr capsule Take 1 capsule (37.5 mg total) by mouth daily with breakfast. 90 capsule 0   No current facility-administered medications for this visit.    Allergies  Allergen Reactions   Penicillins Hives and Swelling    Family History  Problem Relation Age of Onset   Brain cancer Mother    Thyroid  disease Mother    Hypertension Mother    Breast cancer Maternal Aunt 67   Breast cancer Cousin 11   Breast cancer Maternal Aunt     Social History   Socioeconomic History   Marital status: Married    Spouse name: Berklee Battey   Number of children: Not on file   Years of education: Not on file   Highest education level: Not on file  Occupational History   Occupation: Retired   Occupation: retired  Tobacco Use   Smoking status: Former    Current packs/day: 0.00    Average packs/day: 1 pack/day for 30.0 years (30.0 ttl pk-yrs)    Types: Cigarettes    Start date: 01/20/1984    Quit date: 01/19/2014    Years since quitting: 9.7   Smokeless tobacco: Former  Building services engineer status: Never Used  Substance and Sexual Activity   Alcohol use: No    Alcohol/week: 0.0 standard drinks of alcohol   Drug use: No   Sexual activity: Not Currently  Other Topics Concern   Not on file  Social History Narrative   Not on file   Social Drivers of Health   Financial Resource Strain: Low Risk  (06/24/2023)   Overall Financial Resource Strain (CARDIA)    Difficulty of Paying Living Expenses: Not hard at all  Food Insecurity: No Food Insecurity (06/24/2023)   Hunger Vital Sign    Worried About Running Out of Food in the Last Year: Never true    Ran Out of Food in the Last Year: Never true  Transportation Needs: No Transportation Needs (06/24/2023)   PRAPARE - Administrator, Civil Service (Medical): No    Lack of Transportation  (Non-Medical): No  Physical Activity: Insufficiently Active (06/24/2023)   Exercise Vital Sign    Days of Exercise per Week: 3 days    Minutes of Exercise per Session: 20 min  Stress: No Stress Concern Present (06/24/2023)   [employer] of Occupational Health - Occupational Stress Questionnaire    Feeling of Stress : Only a little  Social Connections: Moderately Integrated (06/24/2023)   Social Connection and Isolation Panel    Frequency of Communication  with Friends and Family: More than three times a week    Frequency of Social Gatherings with Friends and Family: Never    Attends Religious Services: More than 4 times per year    Active Member of [employer] or Organizations: No    Attends Banker Meetings: Never    Marital Status: Married  Catering manager Violence: Not At Risk (06/24/2023)   Humiliation, Afraid, Rape, and Kick questionnaire    Fear of Current or Ex-Partner: No    Emotionally Abused: No    Physically Abused: No    Sexually Abused: No     Constitutional: Denies fever, malaise, fatigue, headache or abrupt weight changes.  HEENT: Denies eye pain, eye redness, ear pain, ringing in the ears, wax buildup, runny nose, nasal congestion, bloody nose, or sore throat. Respiratory: Patient reports cough.  Denies difficulty breathing, shortness of breath.   Cardiovascular: Patient report swelling in feet.  Denies chest pain, chest tightness, palpitations or swelling in the hands.  GU: Denies urgency, frequency, pain with urination, burning sensation, blood in urine, odor or discharge. Musculoskeletal: Patient reports chronic joint pain.  Denies decrease in range of motion, difficulty with gait, muscle pain or joint swelling.  Neurological: Denies dizziness, difficulty with memory, difficulty with speech or problems with balance and coordination.    No other specific complaints in a complete review of systems (except as listed in HPI above).      Objective:    Physical Exam  BP 124/78 (BP Location: Right Arm, Patient Position: Sitting, Cuff Size: Normal)   Pulse 92   Ht 5' 4 (1.626 m)   Wt 223 lb (101.2 kg)   SpO2 95%   BMI 38.28 kg/m   Wt Readings from Last 3 Encounters:  09/27/23 223 lb (101.2 kg)  09/24/23 221 lb 12.8 oz (100.6 kg)  09/18/23 218 lb (98.9 kg)    General: Appears her stated age, obese, in NAD. Skin: Warm, dry and intact.  Cardiovascular: Normal rate and rhythm. S1,S2 noted.  No murmur, rubs or gallops noted. No JVD.  Trace pitting BLE edema around the tops of the feet and ankles.  Pulmonary/Chest: Normal effort and positive vesicular breath sounds. No respiratory distress. No wheezes, rales or ronchi noted.  Musculoskeletal: Gait slow and steady without device. Neurological: Alert.  She seems to have difficulty with recall, unable to tell me what medications she is taking.   BMET    Component Value Date/Time   NA 141 09/24/2023 0827   NA 141 05/29/2018 1125   NA 141 02/14/2014 0818   K 3.5 09/24/2023 0827   K 4.2 02/14/2014 0818   CL 106 09/24/2023 0827   CL 107 02/14/2014 0818   CO2 28 09/24/2023 0827   CO2 29 02/14/2014 0818   GLUCOSE 85 09/24/2023 0827   GLUCOSE 88 02/14/2014 0818   BUN 18 09/24/2023 0827   BUN 12 05/29/2018 1125   BUN 13 02/14/2014 0818   CREATININE 0.97 09/24/2023 0827   CREATININE 0.84 01/06/2023 0801   CALCIUM  8.0 (L) 09/24/2023 0827   CALCIUM  8.1 (L) 02/14/2014 0818   GFRNONAA 60 (L) 09/24/2023 0827   GFRNONAA 73 01/23/2020 0754   GFRAA 84 01/23/2020 0754    Lipid Panel     Component Value Date/Time   CHOL 194 01/06/2023 0801   CHOL 133 05/29/2018 1125   CHOL 188 02/14/2014 0818   TRIG 127 01/06/2023 0801   TRIG 101 02/14/2014 0818   HDL 57 01/06/2023 0801  HDL 60 05/29/2018 1125   HDL 55 02/14/2014 0818   CHOLHDL 3.4 01/06/2023 0801   VLDL 20 02/14/2014 0818   LDLCALC 113 (H) 01/06/2023 0801   LDLCALC 113 (H) 02/14/2014 0818    CBC    Component Value  Date/Time   WBC 6.6 09/24/2023 0827   RBC 3.99 09/24/2023 0827   HGB 11.0 (L) 09/24/2023 0827   HGB 11.9 05/29/2018 1125   HCT 35.6 (L) 09/24/2023 0827   HCT 37.4 05/29/2018 1125   PLT 275 09/24/2023 0827   PLT 350 11/21/2015 0842   MCV 89.2 09/24/2023 0827   MCV 85 05/29/2018 1125   MCV 87 02/14/2014 0818   MCH 27.6 09/24/2023 0827   MCHC 30.9 09/24/2023 0827   RDW 15.6 (H) 09/24/2023 0827   RDW 14.5 05/29/2018 1125   RDW 14.4 02/14/2014 0818   LYMPHSABS 2.0 09/24/2023 0827   LYMPHSABS 2.4 05/29/2018 1125   LYMPHSABS 2.1 02/14/2014 0818   MONOABS 0.6 09/24/2023 0827   MONOABS 0.4 02/14/2014 0818   EOSABS 0.4 09/24/2023 0827   EOSABS 0.1 05/29/2018 1125   EOSABS 0.1 02/14/2014 0818   BASOSABS 0.0 09/24/2023 0827   BASOSABS 0.0 05/29/2018 1125   BASOSABS 0.0 02/14/2014 0818    Hgb A1C Lab Results  Component Value Date   HGBA1C 5.8 (H) 01/06/2023            Assessment & Plan:   Assessment and Plan    Chronic Obstructive Pulmonary Disease (COPD)  Exacerbation likely due to non-compliance with Advair inhaler. Clear lung sounds with congestion on deep breaths. No wheezing. - Resume Advair 250/50 inhaler twice daily. - Continue nebulizer as needed for acute symptoms.  Peripheral edema Persistent leg swelling. Previous Lasix  increase showed improvement. Echocardiogram scheduled to assess for heart failure. Medications and ultrasound ruled out other causes. Possible heart failure pending echocardiogram. - Maintain current Lasix  dosage until consultation with PCP. - Elevate feet and monitor fluid intake. - Avoid excessive salt intake.  Follow-up with your PCP as previously scheduled Angeline Laura, NP

## 2023-10-03 NOTE — Telephone Encounter (Signed)
Will discuss at upcoming appointment today 

## 2023-10-05 ENCOUNTER — Telehealth: Payer: Self-pay | Admitting: Family Medicine

## 2023-10-05 NOTE — Telephone Encounter (Signed)
 Can you call patient to check on her swelling? I would like to clarify if she is still taking Amlodipine  or if she is still on it.  At this point, her BP seems controlled but still having swelling. It could be side effect of Amlodipine  5mg  daily. She should discontinue this med for now and see if swelling improves.  Otherwise, she can maintain Furosemide  as needed, and elevation / compression for swelling.  Based on my evaluation and Regina's visit with her on 10/03/23 I don't see any additional options at this time. We already have ECHOcardiogram upcoming in August and Pulmonologist for her breathing.  Her labs and prior imaging were reassuring I don't see other explanation for swelling.  Marsa Officer, DO [facility] 10/05/2023, 6:38 PM

## 2023-10-06 NOTE — Telephone Encounter (Signed)
 Left message for patient to return call OK to advise

## 2023-10-17 ENCOUNTER — Ambulatory Visit

## 2023-10-18 ENCOUNTER — Other Ambulatory Visit: Payer: Self-pay | Admitting: Family Medicine

## 2023-10-18 DIAGNOSIS — R030 Elevated blood-pressure reading, without diagnosis of hypertension: Secondary | ICD-10-CM

## 2023-10-19 ENCOUNTER — Ambulatory Visit
Admission: RE | Admit: 2023-10-19 | Discharge: 2023-10-19 | Disposition: A | Discharge status: Home / Self Care | Source: Ambulatory Visit | Attending: Family Medicine | Admitting: Family Medicine

## 2023-10-19 DIAGNOSIS — Z1231 Encounter for screening mammogram for malignant neoplasm of breast: Secondary | ICD-10-CM | POA: Diagnosis not present

## 2023-10-20 NOTE — Telephone Encounter (Signed)
 Requested Prescriptions  Pending Prescriptions Disp Refills   amLODipine  (NORVASC ) 5 MG tablet [Pharmacy Med Name: amLODIPine  BESYLATE 5 MG TAB] 90 tablet 0    Sig: TAKE 1 TABLET BY MOUTH DAILY     Cardiovascular: Calcium  Channel Blockers 2 Passed - 10/20/2023 11:23 AM      Passed - Last BP in normal range    BP Readings from Last 1 Encounters:  10/03/23 124/78         Passed - Last Heart Rate in normal range    Pulse Readings from Last 1 Encounters:  10/03/23 92         Passed - Valid encounter within last 6 months    Recent Outpatient Visits           2 weeks ago Peripheral edema   [facility] [place], Angeline ORN, NP   3 weeks ago Bilateral lower extremity edema   [facility] [place], Marsa PARAS, DO       Future Appointments             In 3 months Edman, Marsa PARAS, DO  [facility], [facility]

## 2023-10-25 ENCOUNTER — Other Ambulatory Visit: Payer: Self-pay | Admitting: Family Medicine

## 2023-10-25 ENCOUNTER — Ambulatory Visit: Payer: Medicare HMO | Admitting: Dermatology

## 2023-10-25 DIAGNOSIS — Z1283 Encounter for screening for malignant neoplasm of skin: Secondary | ICD-10-CM

## 2023-10-25 DIAGNOSIS — L814 Other melanin hyperpigmentation: Secondary | ICD-10-CM

## 2023-10-25 DIAGNOSIS — L578 Other skin changes due to chronic exposure to nonionizing radiation: Secondary | ICD-10-CM

## 2023-10-25 DIAGNOSIS — D229 Melanocytic nevi, unspecified: Secondary | ICD-10-CM

## 2023-10-25 DIAGNOSIS — R928 Other abnormal and inconclusive findings on diagnostic imaging of breast: Secondary | ICD-10-CM

## 2023-10-25 DIAGNOSIS — W908XXA Exposure to other nonionizing radiation, initial encounter: Secondary | ICD-10-CM | POA: Diagnosis not present

## 2023-10-25 DIAGNOSIS — Z85828 Personal history of other malignant neoplasm of skin: Secondary | ICD-10-CM | POA: Diagnosis not present

## 2023-10-25 DIAGNOSIS — D1801 Hemangioma of skin and subcutaneous tissue: Secondary | ICD-10-CM

## 2023-10-25 DIAGNOSIS — L820 Inflamed seborrheic keratosis: Secondary | ICD-10-CM | POA: Diagnosis not present

## 2023-10-25 DIAGNOSIS — L821 Other seborrheic keratosis: Secondary | ICD-10-CM | POA: Diagnosis not present

## 2023-10-25 MED ORDER — CICLOPIROX 1 % EX SHAM: MEDICATED_SHAMPOO | CUTANEOUS | 5 refills | Status: AC

## 2023-10-25 NOTE — Patient Instructions (Addendum)

## 2023-10-25 NOTE — Progress Notes (Signed)
 Follow-Up Visit   Subjective  Kelly YARBOUGH is a 78 y.o. female who presents for the following: Skin Cancer Screening and Full Body Skin Exam  The patient presents for Total-Body Skin Exam (TBSE) for skin cancer screening and mole check. The patient has spots, moles and lesions to be evaluated, some may be new or changing. She has folliculitis vs  prurigo nodules vs ISKs of the occipital scalp. She is using ciclopirox  shampoo and fluocinonide  solution. She completed 2 mos of doxycycline  100 mg, with no improvement. No itching and patient doesn't scratch/pick at.  History of BCC of the left middle finger (2007). She has a crusted spot on the right forehead.    The following portions of the chart were reviewed this encounter and updated as appropriate: medications, allergies, medical history  Review of Systems:  No other skin or systemic complaints except as noted in HPI or Assessment and Plan.  Objective  Well appearing patient in no apparent distress; mood and affect are within normal limits.  A full examination was performed including scalp, head, eyes, ears, nose, lips, neck, chest, axillae, abdomen, back, buttocks, bilateral upper extremities, bilateral lower extremities, hands, feet, fingers, toes, fingernails, and toenails. All findings within normal limits unless otherwise noted below.   Relevant physical exam findings are noted in the Assessment and Plan.  occipital scalp x 3, R temporal scalp x 1, R hip x 1 (5) Firm flesh tan papules at occipital scalp; erythematous stuck-on, waxy papules    Assessment & Plan   SKIN CANCER SCREENING PERFORMED TODAY.  ACTINIC DAMAGE - Chronic condition, secondary to cumulative UV/sun exposure - diffuse scaly erythematous macules with underlying dyspigmentation - Recommend daily broad spectrum sunscreen SPF 30+ to sun-exposed areas, reapply every 2 hours as needed.  - Staying in the shade or wearing long sleeves, sun glasses (UVA+UVB  protection) and wide brim hats (4-inch brim around the entire circumference of the hat) are also recommended for sun protection.  - Call for new or changing lesions.  LENTIGINES, SEBORRHEIC KERATOSES, HEMANGIOMAS - Benign normal skin lesions - Benign-appearing - Call for any changes  MELANOCYTIC NEVI - Tan-brown and/or pink-flesh-colored symmetric macules and papules - Benign appearing on exam today - Observation - Call clinic for new or changing moles - Recommend daily use of broad spectrum spf 30+ sunscreen to sun-exposed areas.   HISTORY OF BASAL CELL CARCINOMA OF THE SKIN Left middle finger, 2007 - No evidence of recurrence today - Recommend regular full body skin exams - Recommend daily broad spectrum sunscreen SPF 30+ to sun-exposed areas, reapply every 2 hours as needed.  - Call if any new or changing lesions are noted between office visits    INFLAMED SEBORRHEIC KERATOSIS (5) occipital scalp x 3, R temporal scalp x 1, R hip x 1 (5) vs Prurigo Nodules (occipital scalp)  Symptomatic, irritating, patient would like treated. Avoid picking/scratching.  Continue Ciclopirox  shampoo 2-3x week, let sit 5-10 minutes before rinsing.  Continue Fluocinonide  solution 1-2 times daily to affected areas on scalp as needed for itching. Avoid applying to face, groin, and axilla. Use as directed. Long-term use can cause thinning of the skin.   Consider ILK injections to aas scalp if not improving with cryotherapy.  Destruction of lesion - occipital scalp x 3, R temporal scalp x 1, R hip x 1 (5)  Destruction method: cryotherapy   Informed consent: discussed and consent obtained   Lesion destroyed using liquid nitrogen: Yes   Region frozen until  ice ball extended beyond lesion: Yes   Outcome: patient tolerated procedure well with no complications   Post-procedure details: wound care instructions given   Additional details:  Prior to procedure, discussed risks of blister formation, small  wound, skin dyspigmentation, or rare scar following cryotherapy. Recommend Vaseline ointment to treated areas while healing.   Return in about 6 months (around 04/26/2024) for recheck scalp. Also 1 year TBSE, hx BCC.  IAndrea Kerns, CMA, am acting as scribe for Rexene Rattler, MD .   Documentation: I have reviewed the above documentation for accuracy and completeness, and I agree with the above.  Rexene Rattler, MD

## 2023-11-03 ENCOUNTER — Telehealth: Payer: Self-pay

## 2023-11-03 DIAGNOSIS — J432 Centrilobular emphysema: Secondary | ICD-10-CM

## 2023-11-03 MED ORDER — BUDESONIDE-FORMOTEROL FUMARATE 160-4.5 MCG/ACT IN AERO
2.0000 | INHALATION_SPRAY | Freq: Two times a day (BID) | RESPIRATORY_TRACT | 12 refills | Status: AC
Start: 2023-11-03 — End: ?

## 2023-11-03 NOTE — Addendum Note (Signed)
 Addended by: EDMAN MARSA PARAS on: 11/03/2023 01:21 PM   Modules accepted: Orders

## 2023-11-03 NOTE — Telephone Encounter (Signed)
 Copied from CRM [id number]. Topic: Clinical - Prescription Issue >> Nov 03, 2023  8:44 AM Avram MATSU wrote: Reason for CRM: pt stated she does not like this medication fluticasone -salmeterol (ADVAIR) 250-50 MCG/ACT AEPB [[id number]] it gives her a bad reaction. She stated she had really bad sweats from it.

## 2023-11-03 NOTE — Telephone Encounter (Signed)
 The Advair is for Emphysema COPD maintenance and symptom management. I looked it up there is a very rare chance of this side effect. But if it is bothering her she can stop it.  We can consider switching to a Symbicort  option. It should not cause this potential side effect but Symbicort  may have higher cost.  Marsa Officer, DO [facility] 11/03/2023, 10:14 AM

## 2023-11-03 NOTE — Telephone Encounter (Signed)
 Agreeable to stopping advair and switching symbicort 

## 2023-11-03 NOTE — Telephone Encounter (Signed)
 Copied from CRM [id number]. Topic: Clinical - Prescription Issue >> Nov 03, 2023 10:38 AM Kelly Weber wrote: Patient called.. said insurance will not cover symbicort  .. Is their alternative?

## 2023-11-09 ENCOUNTER — Other Ambulatory Visit: Payer: Self-pay

## 2023-11-09 ENCOUNTER — Emergency Department (HOSPITAL_COMMUNITY)

## 2023-11-09 ENCOUNTER — Observation Stay (HOSPITAL_COMMUNITY)
Admission: EM | Admit: 2023-11-09 | Discharge: 2023-11-11 | Disposition: A | Discharge status: Home / Self Care | Attending: Internal Medicine | Admitting: Internal Medicine

## 2023-11-09 ENCOUNTER — Encounter (HOSPITAL_COMMUNITY): Payer: Self-pay | Admitting: Emergency Medicine

## 2023-11-09 DIAGNOSIS — R000 Tachycardia, unspecified: Secondary | ICD-10-CM | POA: Insufficient documentation

## 2023-11-09 DIAGNOSIS — J439 Emphysema, unspecified: Secondary | ICD-10-CM | POA: Diagnosis not present

## 2023-11-09 DIAGNOSIS — R918 Other nonspecific abnormal finding of lung field: Secondary | ICD-10-CM | POA: Diagnosis not present

## 2023-11-09 DIAGNOSIS — R001 Bradycardia, unspecified: Secondary | ICD-10-CM | POA: Diagnosis not present

## 2023-11-09 DIAGNOSIS — R079 Chest pain, unspecified: Secondary | ICD-10-CM | POA: Diagnosis not present

## 2023-11-09 DIAGNOSIS — Z85828 Personal history of other malignant neoplasm of skin: Secondary | ICD-10-CM | POA: Insufficient documentation

## 2023-11-09 DIAGNOSIS — I5020 Unspecified systolic (congestive) heart failure: Secondary | ICD-10-CM | POA: Diagnosis not present

## 2023-11-09 DIAGNOSIS — Z6836 Body mass index (BMI) 36.0-36.9, adult: Secondary | ICD-10-CM | POA: Insufficient documentation

## 2023-11-09 DIAGNOSIS — R0789 Other chest pain: Secondary | ICD-10-CM | POA: Diagnosis not present

## 2023-11-09 DIAGNOSIS — I10 Essential (primary) hypertension: Secondary | ICD-10-CM | POA: Diagnosis not present

## 2023-11-09 DIAGNOSIS — R2243 Localized swelling, mass and lump, lower limb, bilateral: Secondary | ICD-10-CM | POA: Diagnosis not present

## 2023-11-09 DIAGNOSIS — E66812 Obesity, class 2: Secondary | ICD-10-CM | POA: Diagnosis not present

## 2023-11-09 DIAGNOSIS — I700 Atherosclerosis of aorta: Secondary | ICD-10-CM | POA: Diagnosis not present

## 2023-11-09 DIAGNOSIS — J449 Chronic obstructive pulmonary disease, unspecified: Secondary | ICD-10-CM | POA: Diagnosis not present

## 2023-11-09 DIAGNOSIS — Z87891 Personal history of nicotine dependence: Secondary | ICD-10-CM | POA: Insufficient documentation

## 2023-11-09 DIAGNOSIS — R42 Dizziness and giddiness: Secondary | ICD-10-CM | POA: Diagnosis not present

## 2023-11-09 DIAGNOSIS — R112 Nausea with vomiting, unspecified: Secondary | ICD-10-CM | POA: Diagnosis not present

## 2023-11-09 DIAGNOSIS — E785 Hyperlipidemia, unspecified: Secondary | ICD-10-CM | POA: Diagnosis not present

## 2023-11-09 DIAGNOSIS — R55 Syncope and collapse: Secondary | ICD-10-CM | POA: Diagnosis not present

## 2023-11-09 DIAGNOSIS — I429 Cardiomyopathy, unspecified: Secondary | ICD-10-CM | POA: Diagnosis not present

## 2023-11-09 LAB — TSH: TSH: 2.202 u[IU]/mL (ref 0.350–4.500)

## 2023-11-09 LAB — BRAIN NATRIURETIC PEPTIDE: B Natriuretic Peptide: 64.1 pg/mL (ref 0.0–100.0)

## 2023-11-09 LAB — COMPREHENSIVE METABOLIC PANEL WITH GFR
ALT: 17 U/L (ref 0–44)
AST: 26 U/L (ref 15–41)
Albumin: 3.5 g/dL (ref 3.5–5.0)
Alkaline Phosphatase: 56 U/L (ref 38–126)
Anion gap: 10 (ref 5–15)
BUN: 14 mg/dL (ref 8–23)
CO2: 24 mmol/L (ref 22–32)
Calcium: 8.2 mg/dL — ABNORMAL LOW (ref 8.9–10.3)
Chloride: 108 mmol/L (ref 98–111)
Creatinine, Ser: 1.04 mg/dL — ABNORMAL HIGH (ref 0.44–1.00)
GFR, Estimated: 55 mL/min — ABNORMAL LOW (ref >60)
Glucose, Bld: 87 mg/dL (ref 70–99)
Potassium: 3.5 mmol/L (ref 3.5–5.1)
Sodium: 142 mmol/L (ref 135–145)
Total Bilirubin: 0.4 mg/dL (ref 0.0–1.2)
Total Protein: 6.2 g/dL — ABNORMAL LOW (ref 6.5–8.1)

## 2023-11-09 LAB — CBC WITH DIFFERENTIAL/PLATELET
Abs Immature Granulocytes: 0.02 K/uL (ref 0.00–0.07)
Basophils Absolute: 0 K/uL (ref 0.0–0.1)
Basophils Relative: 0 %
Eosinophils Absolute: 0.1 K/uL (ref 0.0–0.5)
Eosinophils Relative: 2 %
HCT: 37.1 % (ref 36.0–46.0)
Hemoglobin: 11.4 g/dL — ABNORMAL LOW (ref 12.0–15.0)
Immature Granulocytes: 0 %
Lymphocytes Relative: 35 %
Lymphs Abs: 2.2 K/uL (ref 0.7–4.0)
MCH: 27 pg (ref 26.0–34.0)
MCHC: 30.7 g/dL (ref 30.0–36.0)
MCV: 87.9 fL (ref 80.0–100.0)
Monocytes Absolute: 0.4 K/uL (ref 0.1–1.0)
Monocytes Relative: 7 %
Neutro Abs: 3.5 K/uL (ref 1.7–7.7)
Neutrophils Relative %: 56 %
Platelets: 266 K/uL (ref 150–400)
RBC: 4.22 MIL/uL (ref 3.87–5.11)
RDW: 15.4 % (ref 11.5–15.5)
WBC: 6.3 K/uL (ref 4.0–10.5)
nRBC: 0 % (ref 0.0–0.2)

## 2023-11-09 LAB — TROPONIN I (HIGH SENSITIVITY)
Troponin I (High Sensitivity): 5 ng/L (ref <18)
Troponin I (High Sensitivity): 7 ng/L (ref <18)

## 2023-11-09 LAB — T4, FREE: Free T4: 0.92 ng/dL (ref 0.61–1.12)

## 2023-11-09 MED ORDER — SODIUM CHLORIDE 0.9 % IV BOLUS
1000.0000 mL | Freq: Once | INTRAVENOUS | Status: AC
  Administered 2023-11-09: 1000 mL via INTRAVENOUS

## 2023-11-09 NOTE — H&P (Signed)
 History and Physical    Kelly Weber FMW:[id number] DOB: 1945-06-11 DOA: 11/09/2023  I have briefly reviewed the patient's prior medical records in [name]  PCP: Edman Marsa PARAS, DO  Patient coming from: home  Chief Complaint: chest pain  HPI: Kelly Weber is a 78 y.o. female with medical history significant of COPD, obesity, HTN, HLD, anxiety, recent lower extremity swelling started on diuretics who comes to the hospital for chest pain.  She was at the dermatologist office with her husband as he had an appointment, and while she was waiting for him started experiencing significant chest pain, palpitations, and feeling that she was about to pass out.  EMS was called, and when they arrived her heart rate was in the 180s-190s, and while being transported to the ER she was actually bradycardic in the 30s.  There is no recorded strip but she was brought to the hospital.  She denies any recent fever, chills, chest pains or any other issues.  She does complain of chronic shortness of breath with activities, and she has underlying COPD.  She was recently placed on a fluid pill for lower extremity swelling about a month ago, and her inhaler was changed to albuterol  few weeks ago.  ED Course: In the ED she is afebrile, normotensive, blood work is essentially unremarkable.  Cardiology was consulted and we are asked to admit  Review of Systems: All systems reviewed, and apart from HPI, all negative  Past Medical History:  Diagnosis Date   Allergies    Anxiety    Arthritis    fingers, left knee   COPD (chronic obstructive pulmonary disease) (HCC)    Depression    Diverticulitis    Dyspnea    Fatigue    GERD (gastroesophageal reflux disease)    HLD (hyperlipidemia)    Insomnia 2006   Lower back pain    Skin cancer 2007   Basal Cell CA resected from Left middle finger    Past Surgical History:  Procedure Laterality Date   BREAST CYST ASPIRATION Bilateral    30 years ago    CATARACT EXTRACTION W/ INTRAOCULAR LENS IMPLANT Bilateral 2023   [place]   COLONOSCOPY WITH PROPOFOL  N/A 11/20/2014   Procedure: COLONOSCOPY WITH PROPOFOL ;  Surgeon: Rogelia Copping, MD;  Location: [facility];  Service: Endoscopy;  Laterality: N/A;   COLONOSCOPY WITH PROPOFOL  N/A 06/07/2022   Procedure: COLONOSCOPY WITH PROPOFOL ;  Surgeon: Copping Rogelia, MD;  Location: [facility];  Service: Endoscopy;  Laterality: N/A;   ESOPHAGOGASTRODUODENOSCOPY (EGD) WITH PROPOFOL  N/A 11/20/2014   Procedure: ESOPHAGOGASTRODUODENOSCOPY (EGD) WITH PROPOFOL ;  Surgeon: Rogelia Copping, MD;  Location: [facility];  Service: Endoscopy;  Laterality: N/A;   POLYPECTOMY  11/20/2014   Procedure: POLYPECTOMY;  Surgeon: Rogelia Copping, MD;  Location: [facility];  Service: Endoscopy;;   SKIN CANCER EXCISION Left    finger   TUBAL LIGATION  1970     reports that she quit smoking about 9 years ago. Her smoking use included cigarettes. She started smoking about 39 years ago. She has a 30 pack-year smoking history. She has quit using smokeless tobacco. She reports that she does not drink alcohol and does not use drugs.  Allergies  Allergen Reactions   Penicillins Hives and Swelling    Family History  Problem Relation Age of Onset   Brain cancer Mother    Thyroid  disease Mother    Hypertension Mother    Breast cancer Maternal Aunt 40  Breast cancer Cousin 35   Breast cancer Maternal Aunt     Prior to Admission medications   Medication Sig Start Date End Date Taking? Authorizing Provider  albuterol  (PROVENTIL ) (2.5 MG/3ML) 0.083% nebulizer solution Take 3 mLs (2.5 mg total) by nebulization every 4 (four) hours as needed for wheezing or shortness of breath. 08/04/23   Karamalegos, Marsa PARAS, DO  albuterol  (VENTOLIN  HFA) 108 (90 Base) MCG/ACT inhaler Inhale 2 puffs into the lungs every 6 (six) hours as needed for wheezing or shortness of breath. 08/05/23   Karamalegos, Marsa PARAS, DO   amLODipine  (NORVASC ) 5 MG tablet TAKE 1 TABLET BY MOUTH DAILY 10/20/23   Karamalegos, Marsa PARAS, DO  azelastine  (ASTELIN ) 0.1 % nasal spray Place 2 sprays into both nostrils 2 (two) times daily. Use in each nostril as directed 04/27/22   Edman, Marsa PARAS, DO  baclofen  (LIORESAL ) 10 MG tablet Take 0.5-1 tablets (5-10 mg total) by mouth 3 (three) times daily as needed for muscle spasms. 04/26/23   Karamalegos, Marsa PARAS, DO  budesonide -formoterol  (SYMBICORT ) 160-4.5 MCG/ACT inhaler Inhale 2 puffs into the lungs 2 (two) times daily. 11/03/23   Karamalegos, Marsa PARAS, DO  Calcium  Carbonate-Vit D-Min (CALCIUM  1200 PO) Take by mouth daily.    [provider]  Ciclopirox  1 % shampoo 2-3 times per week lather on scalp, leave on 5-10 minutes, rinse out 10/25/23   Jackquline Sawyer, MD  fluocinonide  (LIDEX ) 0.05 % external solution Apply 1-2 times daily to affected areas on scalp as needed for itching. 06/22/23   Jackquline Sawyer, MD  furosemide  (LASIX ) 40 MG tablet Take 1 tablet (40 mg total) by mouth daily as needed for fluid. 09/27/23   Karamalegos, Marsa PARAS, DO  TURMERIC PO Take by mouth daily.    [provider]  valACYclovir  (VALTREX ) 1000 MG tablet Take 2 tablets (2000mg ) twice a day for one day for fever blister flare. May repeat course for future flares. 01/12/23   Edman Marsa PARAS, DO    Physical Exam: Vitals:   11/09/23 1415 11/09/23 1545 11/09/23 1615 11/09/23 1615  BP: (!) 117/56 114/62 (!) 143/62   Pulse: 85 61 64   Resp: 16 11 16    Temp:    98.2 F (36.8 C)  TempSrc:    Oral  SpO2: 100% 98% 100%   Weight:      Height:          Constitutional: NAD, calm, comfortable Eyes: PERRL, lids and conjunctivae normal ENMT: Mucous membranes are moist.  Neck: normal, supple Respiratory: clear to auscultation bilaterally, no wheezing, no crackles. Normal respiratory effort. No accessory muscle use.  Cardiovascular: Regular rate and rhythm, no murmurs / rubs /  gallops. No extremity edema. 2+ pedal pulses.  Abdomen: no tenderness, no masses palpated. Bowel sounds positive.  Musculoskeletal: no clubbing / cyanosis. Normal muscle tone.  Skin: no rashes, lesions, ulcers. No induration Neurologic: Nonfocal  Labs on Admission: I have personally reviewed following labs and imaging studies  CBC: Recent Labs  Lab 11/09/23 1229  WBC 6.3  NEUTROABS 3.5  HGB 11.4*  HCT 37.1  MCV 87.9  PLT 266   Basic Metabolic Panel: Recent Labs  Lab 11/09/23 1229  NA 142  K 3.5  CL 108  CO2 24  GLUCOSE 87  BUN 14  CREATININE 1.04*  CALCIUM  8.2*   Liver Function Tests: Recent Labs  Lab 11/09/23 1229  AST 26  ALT 17  ALKPHOS 56  BILITOT 0.4  PROT 6.2*  ALBUMIN 3.5  Coagulation Profile: No results for input(s): INR, PROTIME in the last 168 hours. BNP (last 3 results) No results for input(s): PROBNP in the last 8760 hours. CBG: No results for input(s): GLUCAP in the last 168 hours. Thyroid  Function Tests: Recent Labs    11/09/23 1229  TSH 2.202  FREET4 0.92   Urine analysis:    Component Value Date/Time   COLORURINE YELLOW (A) 07/27/2022 2152   APPEARANCEUR HAZY (A) 07/27/2022 2152   APPEARANCEUR Cloudy (A) 10/10/2014 1119   LABSPEC 1.027 07/27/2022 2152   PHURINE 5.0 07/27/2022 2152   GLUCOSEU NEGATIVE 07/27/2022 2152   HGBUR NEGATIVE 07/27/2022 2152   BILIRUBINUR NEGATIVE 07/27/2022 2152   BILIRUBINUR neg 04/20/2016 1035   BILIRUBINUR Negative 10/10/2014 1119   KETONESUR NEGATIVE 07/27/2022 2152   PROTEINUR 30 (A) 07/27/2022 2152   UROBILINOGEN 0.2 04/20/2016 1035   NITRITE NEGATIVE 07/27/2022 2152   LEUKOCYTESUR LARGE (A) 07/27/2022 2152     Radiological Exams on Admission: DG Chest Port 1 View Result Date: 11/09/2023 CLINICAL DATA:  Chest pain. EXAM: PORTABLE CHEST 1 VIEW COMPARISON:  09/24/2023. FINDINGS: The heart size and mediastinal contours are unchanged. Mild diffuse chronic appearing increased interstitial  lung markings. No focal consolidation, pleural effusion, or pneumothorax. No acute osseous abnormality. IMPRESSION: 1. No acute cardiopulmonary findings. 2. Chronic increased interstitial lung markings. Electronically Signed   By: Harrietta Sherry M.D.   On: 11/09/2023 13:00    EKG: Independently reviewed. Sinus rhythm   Assessment/Plan Principal problem Chest pain, tachycardia, bradycardia -chest pain is likely induced due to arrhythmia, high-sensitivity troponin negative x 2.  It is unclear what rhythm she had with a heart rate of 180 or when she was bradycardic.  Appreciate cardiology input, obtain 2D echo, admit, telemetry -TSH unremarkable  Active problems History of COPD -continue home daily medications, hold albuterol  due to tachycardia.  She used albuterol  inhaler about 5 to 6 hours prior to this episode   Essential hypertension-med rec pending  Lower extremity swelling-hold furosemide  now, 2D echo pending  Obesity, class II-BMI 36  DVT prophylaxis: Lovenox   Code Status: Full code  Family Communication: Husband at bedside Disposition Plan: Home when ready Bed Type: Cardiac telemetry Consults called: Cardiology Obs/Inp: Observation   Nilda Fendt, MD, PhD [facility]  Contact via [email or web address]  11/09/2023, 6:17 PM

## 2023-11-09 NOTE — ED Notes (Signed)
 Pt provided dinner tray - no additional concerns at this time, call bell within reach.

## 2023-11-09 NOTE — Consult Note (Addendum)
 Cardiology Consultation   Patient ID: Kelly Weber MRN: [id number]; DOB: [birth year]  Admit date: 11/09/2023 Date of Consult: 11/09/2023  PCP:  Edman Marsa PARAS, DO   [facility] Providers Cardiologist: Changed to Dr. Okey as patient wants to follow-up with cardiology in [place].   Patient Profile: Kelly Weber is a 78 y.o. female with a hx of prior tobacco use, COPD, emphysema, cardiomyopathy, hyperlipidemia, hypertension, and arthritis who is being seen 11/09/2023 for the evaluation of near syncope and chest pain at the request of Jayson Pereyra MD.  History of Present Illness: Kelly Weber is a 78 year old female with prior cardiac history listed below.  On 08/2015 she was seen by cardiology for shortness of breath, weakness, dizziness, and near syncope.  An echocardiogram was ordered and showed mildly reduced systolic function of 45 to 50%, normal wall thickness, the study was a poor study and was not able to assess wall motion. It was suspected that deconditioning was the primary cause of these symptoms.  Per chart review patient was seen at the emergency department at [facility] for shortness of breath, lower extremity edema, and a worsening cough.  It was suspected the patient had a COPD exacerbation and was treated with DuoNebs.  The patient was started on Lasix  and the lower extremity edema resolved. At a follow-up office visit and Echocardiogram was ordered but has not been completed yet.    The patient was at a dermatology office and had sudden onset of chest pain and pallor.  Heart rates were reportedly in the 180s to 190s.  EMS arrived heart rates have normalized.  While being transported by EMS heart rates were in the 30s. I am unable to see an EMS run sheet in the chart. On interview patient reported that she had 10/10 chest tightness that started while sitting in the lobby at the dermatology office.  This pain radiated to her right shoulder.  The pain was helped by  nitroglycerin.  Denies any current ongoing chest pain or pleuritic chest pain.  Denies any vomiting, orthopnea, lower extremity edema, fever, chills, and diaphoresis. has had worsening dyspnea on exertion over the past 3 to 4 years.  Gets short of breath walking around stores and carrying in groceries.  Is unable to do more than 4 metabolic equivalents of exertion. Smoked tobacco for 40 years. Quit about 10 years ago.  Denies illicit substance use.  Labs showed negative high-sensitivity troponin of 5 (GFR 55), normal BNP of 64, normocytic anemia with a hemoglobin of 11.4, potassium 3.5, TSH normal at 2.2.  Chest x-ray showed No acute cardiopulmonary findings, and chronic increased interstitial lung markings.  EKG showed normal sinus rhythm with a rate of 67, prolonged QTcB of 460.  Past Medical History:  Diagnosis Date   Allergies    Anxiety    Arthritis    fingers, left knee   COPD (chronic obstructive pulmonary disease) (HCC)    Depression    Diverticulitis    Dyspnea    Fatigue    GERD (gastroesophageal reflux disease)    HLD (hyperlipidemia)    Insomnia 2006   Lower back pain    Skin cancer 2007   Basal Cell CA resected from Left middle finger    Past Surgical History:  Procedure Laterality Date   BREAST CYST ASPIRATION Bilateral    30 years ago   CATARACT EXTRACTION W/ INTRAOCULAR LENS IMPLANT Bilateral 2023   [place]   COLONOSCOPY WITH PROPOFOL  N/A 11/20/2014   Procedure: COLONOSCOPY  WITH PROPOFOL ;  Surgeon: Rogelia Copping, MD;  Location: [facility];  Service: Endoscopy;  Laterality: N/A;   COLONOSCOPY WITH PROPOFOL  N/A 06/07/2022   Procedure: COLONOSCOPY WITH PROPOFOL ;  Surgeon: Copping Rogelia, MD;  Location: [facility];  Service: Endoscopy;  Laterality: N/A;   ESOPHAGOGASTRODUODENOSCOPY (EGD) WITH PROPOFOL  N/A 11/20/2014   Procedure: ESOPHAGOGASTRODUODENOSCOPY (EGD) WITH PROPOFOL ;  Surgeon: Rogelia Copping, MD;  Location: [facility];  Service:  Endoscopy;  Laterality: N/A;   POLYPECTOMY  11/20/2014   Procedure: POLYPECTOMY;  Surgeon: Rogelia Copping, MD;  Location: [facility];  Service: Endoscopy;;   SKIN CANCER EXCISION Left    finger   TUBAL LIGATION  1970     Home Medications:  Prior to Admission medications   Medication Sig Start Date End Date Taking? Authorizing Provider  albuterol  (PROVENTIL ) (2.5 MG/3ML) 0.083% nebulizer solution Take 3 mLs (2.5 mg total) by nebulization every 4 (four) hours as needed for wheezing or shortness of breath. 08/04/23   Karamalegos, Marsa PARAS, DO  albuterol  (VENTOLIN  HFA) 108 (90 Base) MCG/ACT inhaler Inhale 2 puffs into the lungs every 6 (six) hours as needed for wheezing or shortness of breath. 08/05/23   Karamalegos, Marsa PARAS, DO  amLODipine  (NORVASC ) 5 MG tablet TAKE 1 TABLET BY MOUTH DAILY 10/20/23   Karamalegos, Marsa PARAS, DO  azelastine  (ASTELIN ) 0.1 % nasal spray Place 2 sprays into both nostrils 2 (two) times daily. Use in each nostril as directed 04/27/22   Karamalegos, Marsa PARAS, DO  baclofen  (LIORESAL ) 10 MG tablet Take 0.5-1 tablets (5-10 mg total) by mouth 3 (three) times daily as needed for muscle spasms. 04/26/23   Karamalegos, Marsa PARAS, DO  budesonide -formoterol  (SYMBICORT ) 160-4.5 MCG/ACT inhaler Inhale 2 puffs into the lungs 2 (two) times daily. 11/03/23   Karamalegos, Marsa PARAS, DO  Calcium  Carbonate-Vit D-Min (CALCIUM  1200 PO) Take by mouth daily.    [provider]  Ciclopirox  1 % shampoo 2-3 times per week lather on scalp, leave on 5-10 minutes, rinse out 10/25/23   Jackquline Sawyer, MD  fluocinonide  (LIDEX ) 0.05 % external solution Apply 1-2 times daily to affected areas on scalp as needed for itching. 06/22/23   Jackquline Sawyer, MD  furosemide  (LASIX ) 40 MG tablet Take 1 tablet (40 mg total) by mouth daily as needed for fluid. 09/27/23   Karamalegos, Marsa PARAS, DO  TURMERIC PO Take by mouth daily.    [provider]  valACYclovir  (VALTREX ) 1000 MG  tablet Take 2 tablets (2000mg ) twice a day for one day for fever blister flare. May repeat course for future flares. 01/12/23   Karamalegos, Marsa PARAS, DO    Scheduled Meds:  Continuous Infusions:  PRN Meds:   Allergies:    Allergies  Allergen Reactions   Penicillins Hives and Swelling    Social History:   Social History   Socioeconomic History   Marital status: Married    Spouse name: Theatre manager   Number of children: Not on file   Years of education: Not on file   Highest education level: Not on file  Occupational History   Occupation: Retired   Occupation: retired  Tobacco Use   Smoking status: Former    Current packs/day: 0.00    Average packs/day: 1 pack/day for 30.0 years (30.0 ttl pk-yrs)    Types: Cigarettes    Start date: 01/20/1984    Quit date: 01/19/2014    Years since quitting: 9.8   Smokeless tobacco: Former  Building services engineer status:  Never Used  Substance and Sexual Activity   Alcohol use: No    Alcohol/week: 0.0 standard drinks of alcohol   Drug use: No   Sexual activity: Not Currently  Other Topics Concern   Not on file  Social History Narrative   Not on file   Social Drivers of Health   Financial Resource Strain: Low Risk  (06/24/2023)   Overall Financial Resource Strain (CARDIA)    Difficulty of Paying Living Expenses: Not hard at all  Food Insecurity: No Food Insecurity (06/24/2023)   Hunger Vital Sign    Worried About Running Out of Food in the Last Year: Never true    Ran Out of Food in the Last Year: Never true  Transportation Needs: No Transportation Needs (06/24/2023)   PRAPARE - Administrator, Civil Service (Medical): No    Lack of Transportation (Non-Medical): No  Physical Activity: Insufficiently Active (06/24/2023)   Exercise Vital Sign    Days of Exercise per Week: 3 days    Minutes of Exercise per Session: 20 min  Stress: No Stress Concern Present (06/24/2023)   [employer] of Occupational Health -  Occupational Stress Questionnaire    Feeling of Stress : Only a little  Social Connections: Moderately Integrated (06/24/2023)   Social Connection and Isolation Panel    Frequency of Communication with Friends and Family: More than three times a week    Frequency of Social Gatherings with Friends and Family: Never    Attends Religious Services: More than 4 times per year    Active Member of [employer] or Organizations: No    Attends Banker Meetings: Never    Marital Status: Married  Catering manager Violence: Not At Risk (06/24/2023)   Humiliation, Afraid, Rape, and Kick questionnaire    Fear of Current or Ex-Partner: No    Emotionally Abused: No    Physically Abused: No    Sexually Abused: No    Family History:    Family History  Problem Relation Age of Onset   Brain cancer Mother    Thyroid  disease Mother    Hypertension Mother    Breast cancer Maternal Aunt 26   Breast cancer Cousin 35   Breast cancer Maternal Aunt      ROS:  Please see the history of present illness.   All other ROS reviewed and negative.     Physical Exam/Data: Vitals:   11/09/23 1415 11/09/23 1545 11/09/23 1615 11/09/23 1615  BP: (!) 117/56 114/62 (!) 143/62   Pulse: 85 61 64   Resp: 16 11 16    Temp:    98.2 F (36.8 C)  TempSrc:    Oral  SpO2: 100% 98% 100%   Weight:      Height:        Intake/Output Summary (Last 24 hours) at 11/09/2023 1742 Last data filed at 11/09/2023 1532 Gross per 24 hour  Intake 1000 ml  Output --  Net 1000 ml      11/09/2023   12:17 PM 10/03/2023    3:09 PM 09/27/2023   10:47 AM  Last 3 Weights  Weight (lbs) 212 lb 223 lb 223 lb  Weight (kg) 96.163 kg 101.152 kg 101.152 kg     Body mass index is 36.39 kg/m.  General:  Well nourished, well developed, in no acute distress, Alert and orientated on room air. HEENT: normal Neck: JVD assessment limited by body habitus. Vascular: No carotid bruits; Distal pulses 2+ bilaterally Cardiac:  normal S1,  S2;  RRR; no murmur Lungs: Diffuse wheezing heard throughout the lungs.  Bibasilar crackles present. Abd: soft, nontender to palpation, no hepatomegaly  Ext: no edema Musculoskeletal:  No deformities, chest nontender to palpation Skin: warm and dry  Neuro:   no focal abnormalities noted Psych:  Normal affect   EKG:  The EKG was personally reviewed and demonstrates:  normal sinus rhythm with a rate of 67, prolonged QTcB of 460. Telemetry:  Telemetry was personally reviewed and demonstrates: Normal sinus rhythm with heart rates in the 60s to 70s  Relevant CV Studies: Echo pending  Laboratory Data: High Sensitivity Troponin:   Recent Labs  Lab 11/09/23 1229 11/09/23 1534  TROPONINIHS 5 7     Chemistry Recent Labs  Lab 11/09/23 1229  NA 142  K 3.5  CL 108  CO2 24  GLUCOSE 87  BUN 14  CREATININE 1.04*  CALCIUM  8.2*  GFRNONAA 55*  ANIONGAP 10    Recent Labs  Lab 11/09/23 1229  PROT 6.2*  ALBUMIN 3.5  AST 26  ALT 17  ALKPHOS 56  BILITOT 0.4   Lipids No results for input(s): CHOL, TRIG, HDL, LABVLDL, LDLCALC, CHOLHDL in the last 168 hours.  Hematology Recent Labs  Lab 11/09/23 1229  WBC 6.3  RBC 4.22  HGB 11.4*  HCT 37.1  MCV 87.9  MCH 27.0  MCHC 30.7  RDW 15.4  PLT 266   Thyroid   Recent Labs  Lab 11/09/23 1229  TSH 2.202  FREET4 0.92    BNP Recent Labs  Lab 11/09/23 1229  BNP 64.1    DDimer No results for input(s): DDIMER in the last 168 hours.  Radiology/Studies:  DG Chest Port 1 View Result Date: 11/09/2023 CLINICAL DATA:  Chest pain. EXAM: PORTABLE CHEST 1 VIEW COMPARISON:  09/24/2023. FINDINGS: The heart size and mediastinal contours are unchanged. Mild diffuse chronic appearing increased interstitial lung markings. No focal consolidation, pleural effusion, or pneumothorax. No acute osseous abnormality. IMPRESSION: 1. No acute cardiopulmonary findings. 2. Chronic increased interstitial lung markings. Electronically Signed   By:  Harrietta Sherry M.D.   On: 11/09/2023 13:00     Assessment and Plan:  Kelly Weber is a 78 y.o. female with a hx of prior tobacco use, COPD, emphysema, cardiomyopathy, hyperlipidemia, hypertension, normocytic anemia, obesity, and arthritis who is being seen 11/09/2023 for the evaluation of near syncope and chest pain at the request of Jayson Pereyra MD.  Chest pain Patient sitting in the lobby at  the dermatology office. When she has she had 10/10 chest tightness that radiated to her right shoulder.The pain was helped by nitroglycerin.  Denies any current ongoing chest pain.  Has had worsening dyspnea on exertion over the past 3 to 4 years.  Gets short of breath walking around stores and carrying in groceries.  Is unable to do more than 4 metabolic equivalents of exertion.   EKG showed no acute ischemic changes.  Normal high-sensitivity troponins 5> 7.  Prior tobacco use increases cardiac risk. Chest pain has both typical and atypical features of ACS.  Reasonable to consider ischemic evaluation with worsening dyspnea on exertion.  Will make decision following echocardiogram.   Cardiomyopathy Suspected chronic Heart failure Hypertension Echocardiogram in 2017 and showed mildly reduced systolic function of 45 to 50%, normal wall thickness, the study was a poor study and was not able to assess wall motion.  Recently on 09/2023 had lower extremity edema, and orthopnea.  Was started on oral Lasix  and symptoms have resolved.  PTA was on amlodipine  5 mg daily. Euvolemic on exam.  Order Echocardiogram. Continue Lasix  40 mg as needed for fluid. Continue amlodipine  5 mg daily. Will assess need for GDMT after echocardiogram.    COPD Emphysema Prior tobacco use Patient was seen at the emergency department at [facility] on 09/2023 for shortness of breath, lower extremity edema, and a worsening cough.  It was suspected the patient had a COPD exacerbation and was treated with DuoNebs. Smoked tobacco for 40  years. Quit about 10 years ago. Echocardiogram will help assess for pulmonary hypertension. Management per primary.   Hyperlipidemia Aortic atherosclerosis Order lipid panel and LPA.   Borderline prolonged QTCB EKG showed normal sinus rhythm with a rate of 67, slightly prolonged QTcB of 460  It has been shorter in past    Avoid rate prolonging medications.   Otherwise managed per primary    Risk Assessment/Risk Scores:    TIMI Risk Score for Unstable Angina or Non-ST Elevation MI:   The patient's TIMI risk score is 2, which indicates a 8% risk of all cause mortality, new or recurrent myocardial infarction or need for urgent revascularization in the next 14 days.      For questions or updates, please contact [facility] Please consult [email or web address] for contact info under    Signed, Morse Clause, PA-C  11/09/2023 5:42 PM   PT seen and examined   I agree with findings as noted by JENEANE Clause above Pt is a 78 yo with no known CAD   Hx of mild LV dysfunction on echo done in 2017     [facility] also has a hx of HTN, HL, COPD    Per pt's husband she has had episodes of chest tightness at home with mental stress at times   Also has hx of wheezing   She was at a dermatology appt with husband    Developed severe SSCP that radiated to R arm   She has never had this before   Relieved with NTG  Now gone  OVerall she is not very active   Gets SOB  ON exam, pt is an obese 78 yo in NAD Neck  JVP is normal  No bruits Lungs   Moving air though airflow is down Cardiac RRR  No murmurs  NO S3 Chest is nontender to palpitation Abd  No massess No hepatomegaly Ext 2+ DP pulses  NO LE edema     EKG shows no acute ST changes    Impression     CP  Pain is somewhat atypical    Trop is negative   Currently symtpoms free    Pt has risk factors for CAD    She is not very active I would recomm echo to reevaluate LVEF   Further testing base on these results   Lipids   Order lipid panel  Pt  with some atherosclerosis on aorta     Will continue to follow   Vina Gull MD

## 2023-11-09 NOTE — ED Provider Notes (Signed)
  Physical Exam  BP (!) 143/62   Pulse 64   Temp 98.2 F (36.8 C) (Oral)   Resp 16   Ht 5' 4 (1.626 m)   Wt 96.2 kg   SpO2 100%   BMI 36.39 kg/m   Physical Exam  Procedures  Procedures  ED Course / MDM   Clinical Course as of 11/09/23 1709  Wed Nov 09, 2023  1439 HEART score is 5 [SG]  1533 Cardiology to see  [SG]    Clinical Course User Index [SG] Elnor Jayson LABOR, DO   Medical Decision Making Amount and/or Complexity of Data Reviewed Labs: ordered. Radiology: ordered.   I, Larnell Gravely, assumed care for this patient.  In brief 78 year old female here today for period of chest pain and pressure, subsequently followed by period of bradycardia.  Patient was signed out pending cardiology consultation.  Cardiology team supervised by Dr. Okey recommends medicine admission.  Please refer to their note for additional recommendations.  Will admit to hospitalist service.  Discussed this with the patient at bedside.       Gravely Pac T, DO 11/09/23 1710

## 2023-11-09 NOTE — ED Triage Notes (Signed)
 Pt arrives via EMS from dermatology office with reports of sudden onset CP, diaphoresis and pallor. Staff there reports pt HR was 180-190. EMS HR 88, BP 140/100. Aching pain between shoulder blades. EMS gave 324 ASA, 4 mg zofran  and 1 nitroglycerin. During transport pt reports she did not feel well and HR went into 30s, lasted a few minutes.  Denies pain at this time.

## 2023-11-09 NOTE — ED Provider Notes (Signed)
 [place] EMERGENCY DEPARTMENT AT [facility] Provider Note  CSN: [id number] Arrival date & time: 11/09/23 1209  Chief Complaint(s) Chest Pain  HPI Kelly Weber is a 78 y.o. female with past medical history as below, significant for COPD, GERD, HLD, arthritis, emphysema who presents to the ED with complaint of chest pain.  Patient arrives from dermatology office where she had chest pain described as a pressure, heaviness sensation that radiated down her arm, was diaphoretic and appeared pale, heart rate was elevated in the office per report around 180, on EMS arrival heart rate had normalized, she was HDS.  Transient bradycardia en route into the 30s per EMS which is since improved after getting fluids.  Patient reports this is never happened in the past, she feels tired currently but does not have ongoing chest pain.  No abdominal pain nausea or vomiting, no fevers or chills, no change in bowel or bladder function, felt she was at baseline prior to symptom onset  Past Medical History Past Medical History:  Diagnosis Date   Allergies    Anxiety    Arthritis    fingers, left knee   COPD (chronic obstructive pulmonary disease) (HCC)    Depression    Diverticulitis    Dyspnea    Fatigue    GERD (gastroesophageal reflux disease)    HLD (hyperlipidemia)    Insomnia 2006   Lower back pain    Skin cancer 2007   Basal Cell CA resected from Left middle finger   Patient Active Problem List   Diagnosis Date Noted   History of colonic polyps 06/07/2022   Chronic bilateral low back pain without sciatica 06/13/2020   Osteoporosis of lumbar spine 01/30/2020   Atherosclerosis of aorta (HCC) 10/24/2019   Vitamin D  deficiency 11/23/2018   Elevated hemoglobin A1c 01/29/2018   Hyperlipidemia 09/02/2016   GERD (gastroesophageal reflux disease) 09/01/2016   Chronic diarrhea 02/09/2016   Anemia 09/29/2015   Morbid obesity (HCC) 08/27/2015   Centrilobular emphysema (HCC) 08/27/2015    Syncope and collapse 08/26/2015   SOB (shortness of breath) on exertion 08/26/2015   Pityriasis rosea 03/12/2015   Seasonal allergies 02/27/2015   Hx of colonic polyps    Benign neoplasm of descending colon    Irritable bowel syndrome (IBS)    Hiatal hernia    Gonalgia 09/23/2014   Arthritis 09/23/2014   Major depression, recurrent, full remission (HCC) 09/23/2014   Dysesthesia 09/23/2014   Insomnia due to medical condition 09/23/2014   Acne 09/23/2014   H/O malignant neoplasm of skin 09/23/2014   Compulsive tobacco user syndrome 09/23/2014   Hematuria, microscopic 09/23/2014   Seborrheic keratoses, inflamed 09/23/2014   Home Medication(s) Prior to Admission medications   Medication Sig Start Date End Date Taking? Authorizing Provider  albuterol  (PROVENTIL ) (2.5 MG/3ML) 0.083% nebulizer solution Take 3 mLs (2.5 mg total) by nebulization every 4 (four) hours as needed for wheezing or shortness of breath. 08/04/23   Karamalegos, Marsa PARAS, DO  albuterol  (VENTOLIN  HFA) 108 (90 Base) MCG/ACT inhaler Inhale 2 puffs into the lungs every 6 (six) hours as needed for wheezing or shortness of breath. 08/05/23   Karamalegos, Marsa PARAS, DO  amLODipine  (NORVASC ) 5 MG tablet TAKE 1 TABLET BY MOUTH DAILY 10/20/23   Karamalegos, Marsa PARAS, DO  azelastine  (ASTELIN ) 0.1 % nasal spray Place 2 sprays into both nostrils 2 (two) times daily. Use in each nostril as directed 04/27/22   Edman Marsa PARAS, DO  baclofen  (LIORESAL ) 10 MG tablet  Take 0.5-1 tablets (5-10 mg total) by mouth 3 (three) times daily as needed for muscle spasms. 04/26/23   Karamalegos, Marsa PARAS, DO  budesonide -formoterol  (SYMBICORT ) 160-4.5 MCG/ACT inhaler Inhale 2 puffs into the lungs 2 (two) times daily. 11/03/23   Karamalegos, Marsa PARAS, DO  Calcium  Carbonate-Vit D-Min (CALCIUM  1200 PO) Take by mouth daily.    [provider]  Ciclopirox  1 % shampoo 2-3 times per week lather on scalp, leave on 5-10 minutes, rinse  out 10/25/23   Jackquline Sawyer, MD  fluocinonide  (LIDEX ) 0.05 % external solution Apply 1-2 times daily to affected areas on scalp as needed for itching. 06/22/23   Jackquline Sawyer, MD  furosemide  (LASIX ) 40 MG tablet Take 1 tablet (40 mg total) by mouth daily as needed for fluid. 09/27/23   Karamalegos, Marsa PARAS, DO  TURMERIC PO Take by mouth daily.    [provider]  valACYclovir  (VALTREX ) 1000 MG tablet Take 2 tablets (2000mg ) twice a day for one day for fever blister flare. May repeat course for future flares. 01/12/23   Edman Marsa PARAS, DO                                                                                                                                    Past Surgical History Past Surgical History:  Procedure Laterality Date   BREAST CYST ASPIRATION Bilateral    30 years ago   CATARACT EXTRACTION W/ INTRAOCULAR LENS IMPLANT Bilateral 2023      COLONOSCOPY WITH PROPOFOL  N/A 11/20/2014   Procedure: COLONOSCOPY WITH PROPOFOL ;  Surgeon: Rogelia Copping, MD;  Location: [facility];  Service: Endoscopy;  Laterality: N/A;   COLONOSCOPY WITH PROPOFOL  N/A 06/07/2022   Procedure: COLONOSCOPY WITH PROPOFOL ;  Surgeon: Copping Rogelia, MD;  Location: [facility];  Service: Endoscopy;  Laterality: N/A;   ESOPHAGOGASTRODUODENOSCOPY (EGD) WITH PROPOFOL  N/A 11/20/2014   Procedure: ESOPHAGOGASTRODUODENOSCOPY (EGD) WITH PROPOFOL ;  Surgeon: Rogelia Copping, MD;  Location: [facility];  Service: Endoscopy;  Laterality: N/A;   POLYPECTOMY  11/20/2014   Procedure: POLYPECTOMY;  Surgeon: Rogelia Copping, MD;  Location: [facility];  Service: Endoscopy;;   SKIN CANCER EXCISION Left    finger   TUBAL LIGATION  1970   Family History Family History  Problem Relation Age of Onset   Brain cancer Mother    Thyroid  disease Mother    Hypertension Mother    Breast cancer Maternal Aunt 26   Breast cancer Cousin 45   Breast cancer Maternal Aunt     Social  History Social History   Tobacco Use   Smoking status: Former    Current packs/day: 0.00    Average packs/day: 1 pack/day for 30.0 years (30.0 ttl pk-yrs)    Types: Cigarettes    Start date: 01/20/1984    Quit date: 01/19/2014    Years since quitting: 9.8   Smokeless tobacco: Former  Building services engineer  status: Never Used  Substance Use Topics   Alcohol use: No    Alcohol/week: 0.0 standard drinks of alcohol   Drug use: No   Allergies Penicillins  Review of Systems A thorough review of systems was obtained and all systems are negative except as noted in the HPI and PMH.   Physical Exam Vital Signs  I have reviewed the triage vital signs BP (!) 143/62   Pulse 64   Temp 98.2 F (36.8 C) (Oral)   Resp 16   Ht 5' 4 (1.626 m)   Wt 96.2 kg   SpO2 100%   BMI 36.39 kg/m  Physical Exam Vitals and nursing note reviewed.  Constitutional:      General: She is not in acute distress.    Appearance: Normal appearance. She is well-developed. She is obese.  HENT:     Head: Normocephalic and atraumatic.     Right Ear: External ear normal.     Left Ear: External ear normal.     Nose: Nose normal.     Mouth/Throat:     Mouth: Mucous membranes are moist.  Eyes:     General: No scleral icterus.       Right eye: No discharge.        Left eye: No discharge.  Cardiovascular:     Rate and Rhythm: Normal rate and regular rhythm.     Pulses: Normal pulses.     Heart sounds: Normal heart sounds.     Comments: Equal pulses to extremities Pulmonary:     Effort: Pulmonary effort is normal. No respiratory distress.     Breath sounds: Normal breath sounds. No stridor.  Abdominal:     General: Abdomen is flat. There is no distension.     Palpations: Abdomen is soft.     Tenderness: There is no abdominal tenderness.  Musculoskeletal:     Cervical back: No rigidity.     Right lower leg: Edema present.     Left lower leg: Edema present.     Comments: Trace LE edema  Skin:     General: Skin is warm and dry.     Capillary Refill: Capillary refill takes less than 2 seconds.  Neurological:     Mental Status: She is alert and oriented to person, place, and time.     GCS: GCS eye subscore is 4. GCS verbal subscore is 5. GCS motor subscore is 6.  Psychiatric:        Mood and Affect: Mood normal.        Behavior: Behavior normal. Behavior is cooperative.     ED Results and Treatments Labs (all labs ordered are listed, but only abnormal results are displayed) Labs Reviewed  CBC WITH DIFFERENTIAL/PLATELET - Abnormal; Notable for the following components:      Result Value   Hemoglobin 11.4 (*)    All other components within normal limits  COMPREHENSIVE METABOLIC PANEL WITH GFR - Abnormal; Notable for the following components:   Creatinine, Ser 1.04 (*)    Calcium  8.2 (*)    Total Protein 6.2 (*)    GFR, Estimated 55 (*)    All other components within normal limits  TSH  T4, FREE  BRAIN NATRIURETIC PEPTIDE  TROPONIN I (HIGH SENSITIVITY)  TROPONIN I (HIGH SENSITIVITY)  Radiology DG Chest Port 1 View Result Date: 11/09/2023 CLINICAL DATA:  Chest pain. EXAM: PORTABLE CHEST 1 VIEW COMPARISON:  09/24/2023. FINDINGS: The heart size and mediastinal contours are unchanged. Mild diffuse chronic appearing increased interstitial lung markings. No focal consolidation, pleural effusion, or pneumothorax. No acute osseous abnormality. IMPRESSION: 1. No acute cardiopulmonary findings. 2. Chronic increased interstitial lung markings. Electronically Signed   By: Harrietta Sherry M.D.   On: 11/09/2023 13:00    Pertinent labs & imaging results that were available during my care of the patient were reviewed by me and considered in my medical decision making (see MDM for details).  Medications Ordered in ED Medications  sodium chloride  0.9 % bolus 1,000 mL (0  mLs Intravenous Stopped 11/09/23 1532)                                                                                                                                     Procedures Procedures  (including critical care time)  Medical Decision Making / ED Course    Medical Decision Making:    TIFFANCY MOGER is a 78 y.o. female with past medical history as below, significant for COPD, GERD, HLD, arthritis, emphysema who presents to the ED with complaint of chest pain.. The complaint involves an extensive differential diagnosis and also carries with it a high risk of complications and morbidity.  Serious etiology was considered. Ddx includes but is not limited to: Differential includes all life-threatening causes for chest pain. This includes but is not exclusive to acute coronary syndrome, aortic dissection, pulmonary embolism, cardiac tamponade, community-acquired pneumonia, pericarditis, musculoskeletal chest wall pain, etc.   Complete initial physical exam performed, notably the patient was in no acute distress, heart rate has since improved.    Reviewed and confirmed nursing documentation for past medical history, family history, social history.  Vital signs reviewed.    CP Tachycardia / bradycardia Near syncope > - Symptoms have improved since the onset, denies similar symptoms in the past - She received aspirin, Zofran , nitroglycerin prior to arrival - Symptoms have improved - I recommend patient for admission - Cardiology to come evaluate    Clinical Course as of 11/09/23 1634  Wed Nov 09, 2023  1439 HEART score is 5 [SG]  1533 Cardiology to see  [SG]    Clinical Course User Index [SG] Elnor Jayson LABOR, DO                   Additional history obtained: -Additional history obtained from ems -External records from outside source obtained and reviewed including: Chart review including previous notes, labs, imaging, consultation notes including  Prior ER  evaluation, primary care documentation, home medications   Lab Tests: -I ordered, reviewed, and interpreted labs.   The pertinent results include:   Labs Reviewed  CBC WITH DIFFERENTIAL/PLATELET - Abnormal; Notable for the following components:      Result Value  Hemoglobin 11.4 (*)    All other components within normal limits  COMPREHENSIVE METABOLIC PANEL WITH GFR - Abnormal; Notable for the following components:   Creatinine, Ser 1.04 (*)    Calcium  8.2 (*)    Total Protein 6.2 (*)    GFR, Estimated 55 (*)    All other components within normal limits  TSH  T4, FREE  BRAIN NATRIURETIC PEPTIDE  TROPONIN I (HIGH SENSITIVITY)  TROPONIN I (HIGH SENSITIVITY)    Notable for labs are stable  EKG   EKG Interpretation Date/Time:  Wednesday November 09 2023 12:17:33 EDT Ventricular Rate:  67 PR Interval:  161 QRS Duration:  99 QT Interval:  435 QTC Calculation: 460 R Axis:   33  Text Interpretation: Sinus rhythm Similar to prior Confirmed by Elnor Savant ([id number]) on 11/09/2023 12:23:08 PM         Imaging Studies ordered: I ordered imaging studies including chest x-ray I independently visualized the following imaging with scope of interpretation limited to determining acute life threatening conditions related to emergency care; findings noted above I agree with the radiologist interpretation If any imaging was obtained with contrast I closely monitored patient for any possible adverse reaction a/w contrast administration in the emergency department   Medicines ordered and prescription drug management: Meds ordered this encounter  Medications   sodium chloride  0.9 % bolus 1,000 mL    -I have reviewed the patients home medicines and have made adjustments as needed   Consultations Obtained: I requested consultation with the cardiology,  and discussed lab and imaging findings as well as pertinent plan -they will come see   Cardiac Monitoring: The patient was maintained on a  cardiac monitor.  I personally viewed and interpreted the cardiac monitored which showed an underlying rhythm of: NSR Continuous pulse oximetry interpreted by myself, 100% on ra.    Social Determinants of Health:  Diagnosis or treatment significantly limited by social determinants of health: former smoker and obesity   Reevaluation: After the interventions noted above, I reevaluated the patient and found that they have improved  Co morbidities that complicate the patient evaluation  Past Medical History:  Diagnosis Date   Allergies    Anxiety    Arthritis    fingers, left knee   COPD (chronic obstructive pulmonary disease) (HCC)    Depression    Diverticulitis    Dyspnea    Fatigue    GERD (gastroesophageal reflux disease)    HLD (hyperlipidemia)    Insomnia 2006   Lower back pain    Skin cancer 2007   Basal Cell CA resected from Left middle finger      Dispostion: Disposition decision including need for hospitalization was considered, and patient disposition pending at time of sign out.    Final Clinical Impression(s) / ED Diagnoses Final diagnoses:  Moderate risk chest pain  Near syncope  Bradycardia        Elnor Savant LABOR, DO 11/09/23 1634

## 2023-11-09 NOTE — ED Notes (Signed)
 CCMD called, pt on monitor

## 2023-11-10 ENCOUNTER — Other Ambulatory Visit (HOSPITAL_COMMUNITY)

## 2023-11-10 ENCOUNTER — Observation Stay (HOSPITAL_COMMUNITY)

## 2023-11-10 ENCOUNTER — Observation Stay (HOSPITAL_BASED_OUTPATIENT_CLINIC_OR_DEPARTMENT_OTHER)

## 2023-11-10 DIAGNOSIS — K449 Diaphragmatic hernia without obstruction or gangrene: Secondary | ICD-10-CM | POA: Diagnosis not present

## 2023-11-10 DIAGNOSIS — I5020 Unspecified systolic (congestive) heart failure: Secondary | ICD-10-CM

## 2023-11-10 DIAGNOSIS — E785 Hyperlipidemia, unspecified: Secondary | ICD-10-CM | POA: Diagnosis not present

## 2023-11-10 DIAGNOSIS — K429 Umbilical hernia without obstruction or gangrene: Secondary | ICD-10-CM | POA: Diagnosis not present

## 2023-11-10 DIAGNOSIS — I5031 Acute diastolic (congestive) heart failure: Secondary | ICD-10-CM | POA: Diagnosis not present

## 2023-11-10 DIAGNOSIS — R1031 Right lower quadrant pain: Secondary | ICD-10-CM | POA: Diagnosis not present

## 2023-11-10 DIAGNOSIS — R079 Chest pain, unspecified: Secondary | ICD-10-CM | POA: Diagnosis not present

## 2023-11-10 DIAGNOSIS — I429 Cardiomyopathy, unspecified: Secondary | ICD-10-CM | POA: Diagnosis not present

## 2023-11-10 LAB — CBC
HCT: 34.1 % — ABNORMAL LOW (ref 36.0–46.0)
Hemoglobin: 10.7 g/dL — ABNORMAL LOW (ref 12.0–15.0)
MCH: 27.1 pg (ref 26.0–34.0)
MCHC: 31.4 g/dL (ref 30.0–36.0)
MCV: 86.3 fL (ref 80.0–100.0)
Platelets: 286 K/uL (ref 150–400)
RBC: 3.95 MIL/uL (ref 3.87–5.11)
RDW: 15.2 % (ref 11.5–15.5)
WBC: 6.2 K/uL (ref 4.0–10.5)
nRBC: 0 % (ref 0.0–0.2)

## 2023-11-10 LAB — COMPREHENSIVE METABOLIC PANEL WITH GFR
ALT: 16 U/L (ref 0–44)
AST: 22 U/L (ref 15–41)
Albumin: 3 g/dL — ABNORMAL LOW (ref 3.5–5.0)
Alkaline Phosphatase: 64 U/L (ref 38–126)
Anion gap: 6 (ref 5–15)
BUN: 12 mg/dL (ref 8–23)
CO2: 27 mmol/L (ref 22–32)
Calcium: 8.1 mg/dL — ABNORMAL LOW (ref 8.9–10.3)
Chloride: 108 mmol/L (ref 98–111)
Creatinine, Ser: 1.25 mg/dL — ABNORMAL HIGH (ref 0.44–1.00)
GFR, Estimated: 44 mL/min — ABNORMAL LOW (ref >60)
Glucose, Bld: 94 mg/dL (ref 70–99)
Potassium: 3.9 mmol/L (ref 3.5–5.1)
Sodium: 141 mmol/L (ref 135–145)
Total Bilirubin: 0.7 mg/dL (ref 0.0–1.2)
Total Protein: 5.5 g/dL — ABNORMAL LOW (ref 6.5–8.1)

## 2023-11-10 LAB — LIPID PANEL
Cholesterol: 153 mg/dL (ref 0–200)
HDL: 51 mg/dL (ref >40)
LDL Cholesterol: 89 mg/dL (ref 0–99)
Total CHOL/HDL Ratio: 3 ratio
Triglycerides: 65 mg/dL (ref <150)
VLDL: 13 mg/dL (ref 0–40)

## 2023-11-10 LAB — ECHOCARDIOGRAM COMPLETE
Area-P 1/2: 3.02 cm2
Height: 64 in
S' Lateral: 3.3 cm
Weight: 3392 [oz_av]

## 2023-11-10 LAB — LIPASE, BLOOD: Lipase: 30 U/L (ref 11–51)

## 2023-11-10 MED ORDER — FLUTICASONE FUROATE-VILANTEROL 200-25 MCG/ACT IN AEPB
1.0000 | INHALATION_SPRAY | Freq: Every day | RESPIRATORY_TRACT | Status: DC
  Administered 2023-11-10: 1 via RESPIRATORY_TRACT
  Filled 2023-11-10: qty 28

## 2023-11-10 MED ORDER — ACETAMINOPHEN 325 MG PO TABS
650.0000 mg | ORAL_TABLET | Freq: Four times a day (QID) | ORAL | Status: DC | PRN
Start: 2023-11-10 — End: 2023-11-11
  Administered 2023-11-10: 650 mg via ORAL
  Filled 2023-11-10: qty 2

## 2023-11-10 MED ORDER — ONDANSETRON HCL 4 MG/2ML IJ SOLN: 4.0000 mg | Freq: Four times a day (QID) | INTRAMUSCULAR | Status: DC | PRN

## 2023-11-10 MED ORDER — ONDANSETRON HCL 4 MG PO TABS: 4.0000 mg | ORAL_TABLET | Freq: Four times a day (QID) | ORAL | Status: DC | PRN

## 2023-11-10 MED ORDER — IOHEXOL 350 MG/ML SOLN
60.0000 mL | Freq: Once | INTRAVENOUS | Status: AC | PRN
  Administered 2023-11-10: 60 mL via INTRAVENOUS

## 2023-11-10 MED ORDER — ENOXAPARIN SODIUM 40 MG/0.4ML IJ SOSY
40.0000 mg | PREFILLED_SYRINGE | Freq: Every day | INTRAMUSCULAR | Status: DC
  Administered 2023-11-10: 40 mg via SUBCUTANEOUS
  Filled 2023-11-10: qty 0.4

## 2023-11-10 MED ORDER — ROSUVASTATIN CALCIUM 5 MG PO TABS
10.0000 mg | ORAL_TABLET | Freq: Every day | ORAL | Status: DC
  Administered 2023-11-10 – 2023-11-11 (×2): 10 mg via ORAL
  Filled 2023-11-10 (×2): qty 2

## 2023-11-10 MED ORDER — ACETAMINOPHEN 650 MG RE SUPP: 650.0000 mg | Freq: Four times a day (QID) | RECTAL | Status: DC | PRN

## 2023-11-10 MED ORDER — SODIUM CHLORIDE 0.9 % IV SOLN: INTRAVENOUS | Status: AC

## 2023-11-10 NOTE — Progress Notes (Deleted)
 Mobility Specialist Progress Note:    11/10/23 1211  Mobility  Activity Ambulated with assistance  Level of Assistance Moderate assist, patient does 50-74%  Assistive Device Front wheel walker  Distance Ambulated (ft) 24 ft  Activity Response Tolerated well  Mobility Referral Yes  Mobility visit 1 Mobility  Mobility Specialist Start Time (ACUTE ONLY) 1200  Mobility Specialist Stop Time (ACUTE ONLY) 1211  Mobility Specialist Time Calculation (min) (ACUTE ONLY) 11 min   Pt received in bed, requesting assistance to ambulate to bathroom. Tolerated well. ModA to stand with RW, CGA during ambulation. HR 30-40 bpm during mobility. Pt able to perform peri care. Returned pt to bed, all needs met.   Shailee Foots Mobility Specialist Please contact via Special educational needs teacher or  Rehab office at [phone]

## 2023-11-10 NOTE — Progress Notes (Signed)
 Pt returned from echo lab and experienced episode of emesis while attempted  BM.  MD informed. Will cont to monitor

## 2023-11-10 NOTE — Progress Notes (Signed)
 PROGRESS NOTE  Kelly Weber FMW:[id number] DOB: 1946-02-20 DOA: 11/09/2023 PCP: Edman Marsa PARAS, DO   LOS: 0 days   Brief Narrative / Interim history: Kelly Weber is a 78 y.o. female with medical history significant of COPD, obesity, HTN, HLD, anxiety, recent lower extremity swelling started on diuretics who comes to the hospital for chest pain.  She was at the dermatologist office with her husband as he had an appointment, and while she was waiting for him started experiencing significant chest pain, palpitations, and feeling that she was about to pass out.  EMS was called, and when they arrived her heart rate was in the 180s-190s, and while being transported to the ER she was actually bradycardic in the 30s.  There is no recorded strip but she was brought to the hospital.   Subjective / 24h Interval events: Patient also reported intermittent crampy abdominal pain at home, worse in the past month.  Today, when she returned from the echo lab, experienced severe abdominal cramping along with nausea and vomiting.  She has not had nausea and vomiting at home and this is new.  Assesement and Plan: Principal problem Chest pain, tachycardia, bradycardia-chest pain likely induced by tachycardia, high-sensitivity troponin negative x 2.  Rhythm was unclear, she is being monitored on telemetry but without any arrhythmias noted here.  A 2D echocardiogram is pending  Active problems Abdominal pain, nausea, vomiting-more so over the last month, had an acute episode in the hospital this afternoon.  Will obtain LFTs, lipase, and a CT scan of the abdomen and pelvis to rule out major issues given progressive symptoms over the last month including here.  I will downgrade her diet to full liquids for now  History of COPD-no wheezing, stable, has pulmonary follow-up as an outpatient  Essential hypertension-she is normotensive, hold amlodipine   Obesity, class II-BMI 36.  She would benefit from weight  loss  Chronic lower extremity swelling-hold furosemide  for now, 2D echo pending  Scheduled Meds:  enoxaparin  (LOVENOX ) injection  40 mg Subcutaneous Daily   fluticasone  furoate-vilanterol  1 puff Inhalation Daily   rosuvastatin   10 mg Oral Daily   Continuous Infusions: PRN Meds:.acetaminophen  **OR** acetaminophen , ondansetron  **OR** ondansetron  (ZOFRAN ) IV  Current Outpatient Medications  Medication Instructions   albuterol  (PROVENTIL ) 2.5 mg, Nebulization, Every 4 hours PRN   albuterol  (VENTOLIN  HFA) 108 (90 Base) MCG/ACT inhaler 2 puffs, Inhalation, Every 6 hours PRN   amLODipine  (NORVASC ) 5 mg, Oral, Daily   azelastine  (ASTELIN ) 0.1 % nasal spray 2 sprays, Each Nare, 2 times daily, Use in each nostril as directed   baclofen  (LIORESAL ) 5-10 mg, Oral, 3 times daily PRN   budesonide -formoterol  (SYMBICORT ) 160-4.5 MCG/ACT inhaler 2 puffs, Inhalation, 2 times daily   Calcium  Carbonate-Vit D-Min (CALCIUM  1200 PO) Daily   Ciclopirox  1 % shampoo 2-3 times per week lather on scalp, leave on 5-10 minutes, rinse out   fluocinonide  (LIDEX ) 0.05 % external solution Apply 1-2 times daily to affected areas on scalp as needed for itching.   furosemide  (LASIX ) 40 mg, Oral, Daily PRN   TURMERIC PO Daily   valACYclovir  (VALTREX ) 1000 MG tablet Take 2 tablets (2000mg ) twice a day for one day for fever blister flare. May repeat course for future flares.    Diet Orders (From admission, onward)     Start     Ordered   11/10/23 1512  Diet full liquid Room service appropriate? Yes; Fluid consistency: Thin  Diet effective now  Question Answer Comment  Room service appropriate? Yes   Fluid consistency: Thin      11/10/23 1511            DVT prophylaxis: enoxaparin  (LOVENOX ) injection 40 mg Start: 11/10/23 1000   Lab Results  Component Value Date   PLT 266 11/09/2023      Code Status: Full Code  Family Communication: husband at bedside   Status is: Observation The patient will  require care spanning > 2 midnights and should be moved to inpatient because: New onset abdominal pain, nausea, vomiting   Level of care: Telemetry Cardiac  Consultants:  Cardiology  Objective: Vitals:   11/10/23 0350 11/10/23 0825 11/10/23 1210 11/10/23 1433  BP: 133/74 132/69 117/78 137/69  Pulse: 67 66 83 76  Resp: 20 18 18 20   Temp: 98.2 F (36.8 C) 97.9 F (36.6 C) 97.7 F (36.5 C) 97.7 F (36.5 C)  TempSrc: Oral Oral Oral Oral  SpO2: 92% 97% 96% 95%  Weight:      Height:        Intake/Output Summary (Last 24 hours) at 11/10/2023 1511 Last data filed at 11/10/2023 1106 Gross per 24 hour  Intake 1200 ml  Output --  Net 1200 ml   Wt Readings from Last 3 Encounters:  11/09/23 96.2 kg  10/03/23 101.2 kg  09/27/23 101.2 kg    Examination:  Constitutional: NAD Eyes: no scleral icterus ENMT: Mucous membranes are moist.  Neck: normal, supple Respiratory: clear to auscultation bilaterally, no wheezing, no crackles.  Cardiovascular: Regular rate and rhythm, no murmurs / rubs / gallops. No LE edema.  Abdomen: non distended, no tenderness. Bowel sounds positive.  Musculoskeletal: no clubbing / cyanosis.    Data Reviewed: I have independently reviewed following labs and imaging studies   CBC Recent Labs  Lab 11/09/23 1229  WBC 6.3  HGB 11.4*  HCT 37.1  PLT 266  MCV 87.9  MCH 27.0  MCHC 30.7  RDW 15.4  LYMPHSABS 2.2  MONOABS 0.4  EOSABS 0.1  BASOSABS 0.0    Recent Labs  Lab 11/09/23 1229  NA 142  K 3.5  CL 108  CO2 24  GLUCOSE 87  BUN 14  CREATININE 1.04*  CALCIUM  8.2*  AST 26  ALT 17  ALKPHOS 56  BILITOT 0.4  ALBUMIN 3.5  TSH 2.202  BNP 64.1    ------------------------------------------------------------------------------------------------------------------ Recent Labs    11/10/23 0259  CHOL 153  HDL 51  LDLCALC 89  TRIG 65  CHOLHDL 3.0    Lab Results  Component Value Date   HGBA1C 5.8 (H) 01/06/2023    ------------------------------------------------------------------------------------------------------------------ Recent Labs    11/09/23 1229  TSH 2.202    Cardiac Enzymes No results for input(s): CKMB, TROPONINI, MYOGLOBIN in the last 168 hours.  Invalid input(s): CK ------------------------------------------------------------------------------------------------------------------    Component Value Date/Time   BNP 64.1 11/09/2023 1229    CBG: No results for input(s): GLUCAP in the last 168 hours.  No results found for this or any previous visit (from the past 240 hours).   Radiology Studies: No results found.   Nilda Fendt, MD, PhD [facility]  Between 7 am - 7 pm I am available, please contact me via Amion (for emergencies) or Securechat (non urgent messages)  Between 7 pm - 7 am I am not available, please contact night coverage MD/APP via Amion

## 2023-11-10 NOTE — Progress Notes (Signed)
 Rounding Note   Patient Name: Kelly Weber Date of Encounter: 11/10/2023  [facility] Cardiologist: Perla in past  Subjective No CP   R shoulder a little sore   COmplains of HA    Scheduled Meds:  enoxaparin  (LOVENOX ) injection  40 mg Subcutaneous Daily   fluticasone  furoate-vilanterol  1 puff Inhalation Daily   Continuous Infusions:  PRN Meds: acetaminophen  **OR** acetaminophen , ondansetron  **OR** ondansetron  (ZOFRAN ) IV   Vital Signs  Vitals:   11/09/23 1945 11/09/23 1956 11/09/23 2224 11/10/23 0350  BP: (!) 152/77  116/63 133/74  Pulse: 87  63 67  Resp: 20  20 20   Temp:  98 F (36.7 C) 99 F (37.2 C) 98.2 F (36.8 C)  TempSrc:  Oral Oral Oral  SpO2: 98%  96% 92%  Weight:      Height:        Intake/Output Summary (Last 24 hours) at 11/10/2023 0806 Last data filed at 11/09/2023 1532 Gross per 24 hour  Intake 1000 ml  Output --  Net 1000 ml      11/09/2023   12:17 PM 10/03/2023    3:09 PM 09/27/2023   10:47 AM  Last 3 Weights  Weight (lbs) 212 lb 223 lb 223 lb  Weight (kg) 96.163 kg 101.152 kg 101.152 kg      Telemetry SR  - Personally Reviewed  ECG  No new  - Personally Reviewed  Physical Exam  GEN: No acute distress.   Neck: No JVD Cardiac: RRR, no murmur Respiratory: Clear to auscultation  GI: Soft, nontender  No masses MS: No edema; No deformity. Neuro:  Nonfocal  Psych: Normal affect   Labs High Sensitivity Troponin:   Recent Labs  Lab 11/09/23 1229 11/09/23 1534  TROPONINIHS 5 7     Chemistry Recent Labs  Lab 11/09/23 1229  NA 142  K 3.5  CL 108  CO2 24  GLUCOSE 87  BUN 14  CREATININE 1.04*  CALCIUM  8.2*  PROT 6.2*  ALBUMIN 3.5  AST 26  ALT 17  ALKPHOS 56  BILITOT 0.4  GFRNONAA 55*  ANIONGAP 10    Lipids  Recent Labs  Lab 11/10/23 0259  CHOL 153  TRIG 65  HDL 51  LDLCALC 89  CHOLHDL 3.0    Hematology Recent Labs  Lab 11/09/23 1229  WBC 6.3  RBC 4.22  HGB 11.4*  HCT 37.1  MCV 87.9  MCH  27.0  MCHC 30.7  RDW 15.4  PLT 266   Thyroid   Recent Labs  Lab 11/09/23 1229  TSH 2.202  FREET4 0.92    BNP Recent Labs  Lab 11/09/23 1229  BNP 64.1    DDimer No results for input(s): DDIMER in the last 168 hours.   Radiology  DG Chest Port 1 View Result Date: 11/09/2023 CLINICAL DATA:  Chest pain. EXAM: PORTABLE CHEST 1 VIEW COMPARISON:  09/24/2023. FINDINGS: The heart size and mediastinal contours are unchanged. Mild diffuse chronic appearing increased interstitial lung markings. No focal consolidation, pleural effusion, or pneumothorax. No acute osseous abnormality. IMPRESSION: 1. No acute cardiopulmonary findings. 2. Chronic increased interstitial lung markings. Electronically Signed   By: Harrietta Sherry M.D.   On: 11/09/2023 13:00    Cardiac Studies Echo ordered   Patient Profile   Kelly Weber is a 78 y.o. female with a hx of prior tobacco use, COPD, emphysema, cardiomyopathy, hyperlipidemia, hypertension, and arthritis who is being seen 11/09/2023 for the evaluation of near syncope and chest pain at the  request of Jayson Pereyra MD.   Assessment & Plan  1  Chest pain  Pt with episode of severe CP radiating to L arm yesterday  Not assocaited with activity  Trop neg x 2    Pt with atherosclerosis on CT scan in 2016 Plan for echo today     WIth hx of dyspnea and low activity and recent event yesterday I would recomm eval for CAD Husband says she does give out with activity (again, may be related to lungs) but she does not do much   He says she also complains of chest pressure at other times, with mental stress Would set up for CCTA    If cannot be done here could sched as outpt (assuming LVEF not severely depressed)  2  Hx HFmrEF  Echo in 2017 LVEF 45 to 50%   Echo ordered today   3 COPD   Moving air    Has appt in pulmonary soon    4  HL  LDL is 89  HDL 51  Trig 65  With plaquing it should be lower   Would add Crestor  10 mg     For questions or updates, please  contact [facility] Please consult [email or web address] for contact info under     Signed, Vina Gull, MD  11/10/2023, 8:06 AM

## 2023-11-10 NOTE — Progress Notes (Signed)
 Echocardiogram 2D Echocardiogram has been performed.  Damien FALCON Kamarri Fischetti RDCS 11/10/2023, 2:51 PM

## 2023-11-11 ENCOUNTER — Other Ambulatory Visit: Payer: Self-pay | Admitting: Student

## 2023-11-11 ENCOUNTER — Other Ambulatory Visit

## 2023-11-11 ENCOUNTER — Encounter

## 2023-11-11 DIAGNOSIS — I2089 Other forms of angina pectoris: Secondary | ICD-10-CM

## 2023-11-11 DIAGNOSIS — I5020 Unspecified systolic (congestive) heart failure: Secondary | ICD-10-CM | POA: Diagnosis not present

## 2023-11-11 DIAGNOSIS — I429 Cardiomyopathy, unspecified: Secondary | ICD-10-CM | POA: Diagnosis not present

## 2023-11-11 DIAGNOSIS — E785 Hyperlipidemia, unspecified: Secondary | ICD-10-CM | POA: Diagnosis not present

## 2023-11-11 DIAGNOSIS — R079 Chest pain, unspecified: Secondary | ICD-10-CM | POA: Diagnosis not present

## 2023-11-11 LAB — LIPOPROTEIN A (LPA): Lipoprotein (a): 114.3 nmol/L — ABNORMAL HIGH (ref <75.0)

## 2023-11-11 MED ORDER — ROSUVASTATIN CALCIUM 10 MG PO TABS: 10.0000 mg | ORAL_TABLET | Freq: Every day | ORAL | 0 refills | Status: DC

## 2023-11-11 NOTE — Progress Notes (Signed)
 Reviewed AVS, patient expressed understanding of medications, MD follow up reviewed.    Patient states all belongings brought to the hospital at time of admission are accounted for and packed to take home.  Patient informed and expressed understanding of where to pickup discharge medications.  Pt transported to entrance A where family member was waiting in vehicle to transport home.

## 2023-11-11 NOTE — Discharge Summary (Signed)
 Physician Discharge Summary  AMAZIN PINCOCK FMW:[id number] DOB: 1945-06-28 DOA: 11/09/2023  PCP: Edman Marsa PARAS, DO  Admit date: 11/09/2023 Discharge date: 11/11/2023  Admitted From: home Disposition:  home  Recommendations for Outpatient Follow-up:  Follow up with PCP in 1-2 weeks Please obtain BMP/CBC in one week Follow-up with cardiology for coronary CTA as an outpatient  Home Health: None Equipment/Devices: None  Discharge Condition: Stable CODE STATUS: Full code Diet Orders (From admission, onward)     Start     Ordered   11/11/23 0715  Diet regular Fluid consistency: Thin  Diet effective now       Question:  Fluid consistency:  Answer:  Thin   11/11/23 0714            Brief Narrative / Interim history: Kelly Weber is a 78 y.o. female with medical history significant of COPD, obesity, HTN, HLD, anxiety, recent lower extremity swelling started on diuretics who comes to the hospital for chest pain.  She was at the dermatologist office with her husband as he had an appointment, and while she was waiting for him started experiencing significant chest pain, palpitations, and feeling that she was about to pass out.  EMS was called, and when they arrived her heart rate was in the 180s-190s, and while being transported to the ER she was actually bradycardic in the 30s.  There is no recorded strip but she was brought to the hospital.   Hospital Course / Discharge diagnoses: Principal problem Chest pain, tachycardia, bradycardia-chest pain likely induced by tachycardia, high-sensitivity troponin negative x 2.  Rhythm was unclear since it was not recorded.  Cardiology consulted and followed patient while hospitalized.  Underwent a 2D echocardiogram which was fairly unremarkable, showing normal LVEF 55-60%, grade 1 diastolic dysfunction, normal RV.  She had no events while on telemetry.  Cardiology recommends coronary CTA which will be done as an outpatient   Active  problems Abdominal pain, nausea, vomiting-more so over the last month, had an acute episode in the hospital on 7/31.  Underwent a CT scan of the abdomen pelvis which did not show any acute findings but hiatal hernia which can explain her intermittent symptoms at home.  Recommend outpatient follow-up with PCP and referral to gastroenterology or surgery if symptoms persist or get worse History of COPD-no wheezing, stable, has pulmonary follow-up as an outpatient Essential hypertension-resume home medications Obesity, class II-BMI 36.  She would benefit from weight loss Chronic lower extremity swelling-furosemide  PRN  Sepsis ruled out   Discharge Instructions   Allergies as of 11/11/2023       Reactions   Penicillins Hives, Swelling        Medication List     TAKE these medications    albuterol  (2.5 MG/3ML) 0.083% nebulizer solution Commonly known as: PROVENTIL  Take 3 mLs (2.5 mg total) by nebulization every 4 (four) hours as needed for wheezing or shortness of breath.   albuterol  108 (90 Base) MCG/ACT inhaler Commonly known as: VENTOLIN  HFA Inhale 2 puffs into the lungs every 6 (six) hours as needed for wheezing or shortness of breath.   amLODipine  5 MG tablet Commonly known as: NORVASC  TAKE 1 TABLET BY MOUTH DAILY   azelastine  0.1 % nasal spray Commonly known as: ASTELIN  Place 2 sprays into both nostrils 2 (two) times daily. Use in each nostril as directed What changed:  when to take this reasons to take this   baclofen  10 MG tablet Commonly known as: LIORESAL  Take 0.5-1  tablets (5-10 mg total) by mouth 3 (three) times daily as needed for muscle spasms.   budesonide -formoterol  160-4.5 MCG/ACT inhaler Commonly known as: SYMBICORT  Inhale 2 puffs into the lungs 2 (two) times daily.   CALCIUM  1200 PO Take by mouth daily.   Ciclopirox  1 % shampoo 2-3 times per week lather on scalp, leave on 5-10 minutes, rinse out   fluocinonide  0.05 % external solution Commonly  known as: LIDEX  Apply 1-2 times daily to affected areas on scalp as needed for itching.   furosemide  40 MG tablet Commonly known as: LASIX  Take 1 tablet (40 mg total) by mouth daily as needed for fluid.   rosuvastatin  10 MG tablet Commonly known as: CRESTOR  Take 1 tablet (10 mg total) by mouth daily.   TURMERIC PO Take by mouth daily.   valACYclovir  1000 MG tablet Commonly known as: VALTREX  Take 2 tablets (2000mg ) twice a day for one day for fever blister flare. May repeat course for future flares.       Consultations: Cardiology   Procedures/Studies:  ECHOCARDIOGRAM COMPLETE Result Date: 11/10/2023    ECHOCARDIOGRAM REPORT   Patient Name:   Kelly Weber Date of Exam: 11/10/2023 Medical Rec #:  [id number]      Height:       64.0 in Accession #:    [id number]     Weight:       212.0 lb Date of Birth:  [birth year]       BSA:          2.005 m Patient Age:    78 years       BP:           138/74 mmHg Patient Gender: F              HR:           78 bpm. Exam Location:  Inpatient Procedure: 2D Echo, Cardiac Doppler and Color Doppler (Both Spectral and Color            Flow Doppler were utilized during procedure). Indications:    I50.31 Acute diastolic (congestive) heart failure  History:        Patient has prior history of Echocardiogram examinations, most                 recent 09/04/2015. COPD; Risk Factors:Dyslipidemia.  Sonographer:    Damien Senior RDCS Referring Phys: [id number] NILDA HERO Abrina Petz IMPRESSIONS  1. Left ventricular ejection fraction, by estimation, is 55 to 60%. The left ventricle has normal function. The left ventricle has no regional wall motion abnormalities. Left ventricular diastolic parameters are consistent with Grade I diastolic dysfunction (impaired relaxation).  2. Right ventricular systolic function is normal. The right ventricular size is normal. Tricuspid regurgitation signal is inadequate for assessing PA pressure.  3. The mitral valve is normal in structure. Trivial mitral  valve regurgitation.  4. The aortic valve has an indeterminant number of cusps. Aortic valve regurgitation is not visualized.  5. The inferior vena cava is normal in size with greater than 50% respiratory variability, suggesting right atrial pressure of 3 mmHg. FINDINGS  Left Ventricle: Left ventricular ejection fraction, by estimation, is 55 to 60%. The left ventricle has normal function. The left ventricle has no regional wall motion abnormalities. The left ventricular internal cavity size was normal in size. There is  no left ventricular hypertrophy. Left ventricular diastolic parameters are consistent with Grade I diastolic dysfunction (impaired relaxation). Right Ventricle: The right ventricular size is normal. No  increase in right ventricular wall thickness. Right ventricular systolic function is normal. Tricuspid regurgitation signal is inadequate for assessing PA pressure. Left Atrium: Left atrial size was normal in size. Right Atrium: Right atrial size was normal in size. Pericardium: Trivial pericardial effusion is present. Mitral Valve: The mitral valve is normal in structure. Trivial mitral valve regurgitation. Tricuspid Valve: The tricuspid valve is normal in structure. Tricuspid valve regurgitation is trivial. Aortic Valve: The aortic valve has an indeterminant number of cusps. Aortic valve regurgitation is not visualized. Pulmonic Valve: The pulmonic valve was normal in structure. Pulmonic valve regurgitation is not visualized. Aorta: The aortic root and ascending aorta are structurally normal, with no evidence of dilitation. Venous: The inferior vena cava is normal in size with greater than 50% respiratory variability, suggesting right atrial pressure of 3 mmHg. IAS/Shunts: No atrial level shunt detected by color flow Doppler.  LEFT VENTRICLE PLAX 2D LVIDd:         5.10 cm   Diastology LVIDs:         3.30 cm   LV e' medial:    9.03 cm/s LV PW:         1.00 cm   LV E/e' medial:  8.1 LV IVS:         0.90 cm   LV e' lateral:   13.70 cm/s LVOT diam:     2.20 cm   LV E/e' lateral: 5.3 LV SV:         76 LV SV Index:   38 LVOT Area:     3.80 cm  RIGHT VENTRICLE RV S prime:     14.70 cm/s TAPSE (M-mode): 2.0 cm LEFT ATRIUM             Index        RIGHT ATRIUM           Index LA diam:        3.40 cm 1.70 cm/m   RA Area:     17.50 cm LA Vol (A2C):   53.5 ml 26.68 ml/m  RA Volume:   43.50 ml  21.69 ml/m LA Vol (A4C):   41.9 ml 20.89 ml/m LA Biplane Vol: 49.1 ml 24.48 ml/m  AORTIC VALVE LVOT Vmax:   87.00 cm/s LVOT Vmean:  62.800 cm/s LVOT VTI:    0.200 m  AORTA Ao Root diam: 3.00 cm Ao Asc diam:  3.60 cm MITRAL VALVE MV Area (PHT): 3.02 cm     SHUNTS MV Decel Time: 251 msec     Systemic VTI:  0.20 m MV E velocity: 72.80 cm/s   Systemic Diam: 2.20 cm MV A velocity: 102.00 cm/s MV E/A ratio:  0.71 Toribio Fuel MD Electronically signed by Toribio Fuel MD Signature Date/Time: 11/10/2023/6:43:30 PM    Final    CT ABDOMEN PELVIS W CONTRAST Result Date: 11/10/2023 CLINICAL DATA:  Right lower quadrant pain EXAM: CT ABDOMEN AND PELVIS WITH CONTRAST TECHNIQUE: Multidetector CT imaging of the abdomen and pelvis was performed using the standard protocol following bolus administration of intravenous contrast. RADIATION DOSE REDUCTION: This exam was performed according to the departmental dose-optimization program which includes automated exposure control, adjustment of the mA and/or kV according to patient size and/or use of iterative reconstruction technique. CONTRAST:  60mL OMNIPAQUE  IOHEXOL  350 MG/ML SOLN COMPARISON:  CT abdomen and pelvis 10/17/2014. FINDINGS: Lower chest: There is atelectasis in the right lung base. Hepatobiliary: No focal liver abnormality is seen. No gallstones, gallbladder wall thickening, or biliary dilatation. Pancreas: Unremarkable.  No pancreatic ductal dilatation or surrounding inflammatory changes. Spleen: Normal in size without focal abnormality. Adrenals/Urinary Tract: There is a 2  2.0 cm left adrenal nodule which was previously characterized as adenoma. This has slightly decreased in size. The right adrenal gland is within normal limits. The bilateral kidneys and bladder are within normal limits. Stomach/Bowel: There is a moderate-sized hiatal hernia. Stomach is within normal limits. Appendix appears normal. No evidence of bowel wall thickening, distention, or inflammatory changes. Vascular/Lymphatic: Aortic atherosclerosis. No enlarged abdominal or pelvic lymph nodes. Reproductive: Uterus and bilateral adnexa are unremarkable. Other: There is trace free fluid in the pelvis. There is a small fat containing umbilical hernia. Musculoskeletal: No fracture is seen. IMPRESSION: 1. No acute localizing process in the abdomen or pelvis. 2. Trace free fluid in the pelvis. 3. Moderate-sized hiatal hernia. Aortic Atherosclerosis (ICD10-I70.0). Electronically Signed   By: Greig Pique M.D.   On: 11/10/2023 18:04   DG Chest Port 1 View Result Date: 11/09/2023 CLINICAL DATA:  Chest pain. EXAM: PORTABLE CHEST 1 VIEW COMPARISON:  09/24/2023. FINDINGS: The heart size and mediastinal contours are unchanged. Mild diffuse chronic appearing increased interstitial lung markings. No focal consolidation, pleural effusion, or pneumothorax. No acute osseous abnormality. IMPRESSION: 1. No acute cardiopulmonary findings. 2. Chronic increased interstitial lung markings. Electronically Signed   By: Harrietta Sherry M.D.   On: 11/09/2023 13:00   MM 3D SCREENING MAMMOGRAM BILATERAL BREAST Result Date: 10/24/2023 CLINICAL DATA:  Screening. EXAM: DIGITAL SCREENING BILATERAL MAMMOGRAM WITH TOMOSYNTHESIS AND CAD TECHNIQUE: Bilateral screening digital craniocaudal and mediolateral oblique mammograms were obtained. Bilateral screening digital breast tomosynthesis was performed. The images were evaluated with computer-aided detection. COMPARISON:  Previous exam(s). ACR Breast Density Category b: There are scattered areas of  fibroglandular density. FINDINGS: In the right breast, possible masses warrants further evaluation. In the left breast, no findings suspicious for malignancy. IMPRESSION: Further evaluation is suggested for possible masses in the right breast. RECOMMENDATION: Diagnostic mammogram and possibly ultrasound of the right breast. (Code:[id number]) The patient will be contacted regarding the findings, and additional imaging will be scheduled. BI-RADS CATEGORY  0: Incomplete: Need additional imaging evaluation. Electronically Signed   By: Reyes Phi M.D.   On: 10/24/2023 15:15     Subjective: - no chest pain, shortness of breath, no abdominal pain, nausea or vomiting.   Discharge Exam: BP 130/60 (BP Location: Right Arm)   Pulse 69   Temp 98 F (36.7 C) (Oral)   Resp 18   Ht 5' 4 (1.626 m)   Wt 96.2 kg   SpO2 99%   BMI 36.39 kg/m   General: Pt is alert, awake, not in acute distress Cardiovascular: RRR, S1/S2 +, no rubs, no gallops Respiratory: CTA bilaterally, no wheezing, no rhonchi Abdominal: Soft, NT, ND, bowel sounds + Extremities: no edema, no cyanosis    The results of significant diagnostics from this hospitalization (including imaging, microbiology, ancillary and laboratory) are listed below for reference.     Microbiology: No results found for this or any previous visit (from the past 240 hours).   Labs: Basic Metabolic Panel: Recent Labs  Lab 11/09/23 1229 11/10/23 1516  NA 142 141  K 3.5 3.9  CL 108 108  CO2 24 27  GLUCOSE 87 94  BUN 14 12  CREATININE 1.04* 1.25*  CALCIUM  8.2* 8.1*   Liver Function Tests: Recent Labs  Lab 11/09/23 1229 11/10/23 1516  AST 26 22  ALT 17 16  ALKPHOS 56 64  BILITOT 0.4 0.7  PROT 6.2* 5.5*  ALBUMIN 3.5 3.0*   CBC: Recent Labs  Lab 11/09/23 1229 11/10/23 1516  WBC 6.3 6.2  NEUTROABS 3.5  --   HGB 11.4* 10.7*  HCT 37.1 34.1*  MCV 87.9 86.3  PLT 266 286   CBG: No results for input(s): GLUCAP in the last 168  hours. Hgb A1c No results for input(s): HGBA1C in the last 72 hours. Lipid Profile Recent Labs    11/10/23 0259  CHOL 153  HDL 51  LDLCALC 89  TRIG 65  CHOLHDL 3.0   Thyroid  function studies Recent Labs    11/09/23 1229  TSH 2.202   Urinalysis    Component Value Date/Time   COLORURINE YELLOW (A) 07/27/2022 2152   APPEARANCEUR HAZY (A) 07/27/2022 2152   APPEARANCEUR Cloudy (A) 10/10/2014 1119   LABSPEC 1.027 07/27/2022 2152   PHURINE 5.0 07/27/2022 2152   GLUCOSEU NEGATIVE 07/27/2022 2152   HGBUR NEGATIVE 07/27/2022 2152   BILIRUBINUR NEGATIVE 07/27/2022 2152   BILIRUBINUR neg 04/20/2016 1035   BILIRUBINUR Negative 10/10/2014 1119   KETONESUR NEGATIVE 07/27/2022 2152   PROTEINUR 30 (A) 07/27/2022 2152   UROBILINOGEN 0.2 04/20/2016 1035   NITRITE NEGATIVE 07/27/2022 2152   LEUKOCYTESUR LARGE (A) 07/27/2022 2152    FURTHER DISCHARGE INSTRUCTIONS:   Get Medicines reviewed and adjusted: Please take all your medications with you for your next visit with your Primary MD   Laboratory/radiological data: Please request your Primary MD to go over all hospital tests and procedure/radiological results at the follow up, please ask your Primary MD to get all Hospital records sent to his/her office.   In some cases, they will be blood work, cultures and biopsy results pending at the time of your discharge. Please request that your primary care M.D. goes through all the records of your hospital data and follows up on these results.   Also Note the following: If you experience worsening of your admission symptoms, develop shortness of breath, life threatening emergency, suicidal or homicidal thoughts you must seek medical attention immediately by calling 911 or calling your MD immediately  if symptoms less severe.   You must read complete instructions/literature along with all the possible adverse reactions/side effects for all the Medicines you take and that have been prescribed  to you. Take any new Medicines after you have completely understood and accpet all the possible adverse reactions/side effects.    Do not drive when taking Pain medications or sleeping medications (Benzodaizepines)   Do not take more than prescribed Pain, Sleep and Anxiety Medications. It is not advisable to combine anxiety,sleep and pain medications without talking with your primary care practitioner   Special Instructions: If you have smoked or chewed Tobacco  in the last 2 yrs please stop smoking, stop any regular Alcohol  and or any Recreational drug use.   Wear Seat belts while driving.   Please note: You were cared for by a hospitalist during your hospital stay. Once you are discharged, your primary care physician will handle any further medical issues. Please note that NO REFILLS for any discharge medications will be authorized once you are discharged, as it is imperative that you return to your primary care physician (or establish a relationship with a primary care physician if you do not have one) for your post hospital discharge needs so that they can reassess your need for medications and monitor your lab values.  Time coordinating discharge: 35 minutes  SIGNED:  Nilda Fendt, MD, PhD 11/11/2023, 10:19 AM

## 2023-11-11 NOTE — Progress Notes (Signed)
 Ordered Outpatient cardiac CT for chest pain per Dr Okey. Will schedule follow up appointment with Dr Okey the patients primary cardiologist.

## 2023-11-11 NOTE — Progress Notes (Signed)
 Rounding Note   Patient Name: Kelly Weber Date of Encounter: 11/11/2023  [facility] Cardiologist: Vina Gull, MD   Subjective  Pt denies CP   Breathing is OK    Had N/V yesterday   CT done (abdomen) Scheduled Meds:  enoxaparin  (LOVENOX ) injection  40 mg Subcutaneous Daily   fluticasone  furoate-vilanterol  1 puff Inhalation Daily   rosuvastatin   10 mg Oral Daily   Continuous Infusions:  PRN Meds: acetaminophen  **OR** acetaminophen , ondansetron  **OR** ondansetron  (ZOFRAN ) IV   Vital Signs  Vitals:   11/10/23 1433 11/10/23 1942 11/10/23 2313 11/11/23 0258  BP: 137/69 (!) 149/79 135/69 124/68  Pulse: 76 74 70 76  Resp: 20 18 18 20   Temp: 97.7 F (36.5 C) 98.4 F (36.9 C) 98.3 F (36.8 C) 98.4 F (36.9 C)  TempSrc: Oral Oral Oral Oral  SpO2: 95%     Weight:      Height:        Intake/Output Summary (Last 24 hours) at 11/11/2023 0730 Last data filed at 11/10/2023 1106 Gross per 24 hour  Intake 200 ml  Output --  Net 200 ml      11/09/2023   12:17 PM 10/03/2023    3:09 PM 09/27/2023   10:47 AM  Last 3 Weights  Weight (lbs) 212 lb 223 lb 223 lb  Weight (kg) 96.163 kg 101.152 kg 101.152 kg      Telemetry SR  - Personally Reviewed  ECG  No new - Personally Reviewed   Echo  11/10/23   1. Left ventricular ejection fraction, by estimation, is 55 to 60%. The  left ventricle has normal function. The left ventricle has no regional  wall motion abnormalities. Left ventricular diastolic parameters are  consistent with Grade I diastolic  dysfunction (impaired relaxation).   2. Right ventricular systolic function is normal. The right ventricular  size is normal. Tricuspid regurgitation signal is inadequate for assessing  PA pressure.   3. The mitral valve is normal in structure. Trivial mitral valve  regurgitation.   4. The aortic valve has an indeterminant number of cusps. Aortic valve  regurgitation is not visualized.   5. The inferior vena cava is  normal in size with greater than 50%  respiratory variability, suggesting right atrial pressure of 3 mmHg.   Physical Exam  GEN: No acute distress.   Neck: No JVD Cardiac: RRR, no murmur Respiratory: Clear to auscultation Ext : No LE edema;   Labs High Sensitivity Troponin:   Recent Labs  Lab 11/09/23 1229 11/09/23 1534  TROPONINIHS 5 7     Chemistry Recent Labs  Lab 11/09/23 1229 11/10/23 1516  NA 142 141  K 3.5 3.9  CL 108 108  CO2 24 27  GLUCOSE 87 94  BUN 14 12  CREATININE 1.04* 1.25*  CALCIUM  8.2* 8.1*  PROT 6.2* 5.5*  ALBUMIN 3.5 3.0*  AST 26 22  ALT 17 16  ALKPHOS 56 64  BILITOT 0.4 0.7  GFRNONAA 55* 44*  ANIONGAP 10 6    Lipids  Recent Labs  Lab 11/10/23 0259  CHOL 153  TRIG 65  HDL 51  LDLCALC 89  CHOLHDL 3.0    Hematology Recent Labs  Lab 11/09/23 1229 11/10/23 1516  WBC 6.3 6.2  RBC 4.22 3.95  HGB 11.4* 10.7*  HCT 37.1 34.1*  MCV 87.9 86.3  MCH 27.0 27.1  MCHC 30.7 31.4  RDW 15.4 15.2  PLT 266 286   Thyroid   Recent Labs  Lab  11/09/23 1229  TSH 2.202  FREET4 0.92    BNP Recent Labs  Lab 11/09/23 1229  BNP 64.1    DDimer No results for input(s): DDIMER in the last 168 hours.   Radiology  ECHOCARDIOGRAM COMPLETE Result Date: 11/10/2023    ECHOCARDIOGRAM REPORT   Patient Name:   Kelly Weber Date of Exam: 11/10/2023 Medical Rec #:  [id number]      Height:       64.0 in Accession #:    [id number]     Weight:       212.0 lb Date of Birth:  Jul 15, 1945       BSA:          2.005 m Patient Age:    78 years       BP:           138/74 mmHg Patient Gender: F              HR:           78 bpm. Exam Location:  Inpatient Procedure: 2D Echo, Cardiac Doppler and Color Doppler (Both Spectral and Color            Flow Doppler were utilized during procedure). Indications:    I50.31 Acute diastolic (congestive) heart failure  History:        Patient has prior history of Echocardiogram examinations, most                 recent 09/04/2015. COPD;  Risk Factors:Dyslipidemia.  Sonographer:    Damien Senior RDCS Referring Phys: [id number] NILDA HERO GHERGHE IMPRESSIONS  1. Left ventricular ejection fraction, by estimation, is 55 to 60%. The left ventricle has normal function. The left ventricle has no regional wall motion abnormalities. Left ventricular diastolic parameters are consistent with Grade I diastolic dysfunction (impaired relaxation).  2. Right ventricular systolic function is normal. The right ventricular size is normal. Tricuspid regurgitation signal is inadequate for assessing PA pressure.  3. The mitral valve is normal in structure. Trivial mitral valve regurgitation.  4. The aortic valve has an indeterminant number of cusps. Aortic valve regurgitation is not visualized.  5. The inferior vena cava is normal in size with greater than 50% respiratory variability, suggesting right atrial pressure of 3 mmHg. FINDINGS  Left Ventricle: Left ventricular ejection fraction, by estimation, is 55 to 60%. The left ventricle has normal function. The left ventricle has no regional wall motion abnormalities. The left ventricular internal cavity size was normal in size. There is  no left ventricular hypertrophy. Left ventricular diastolic parameters are consistent with Grade I diastolic dysfunction (impaired relaxation). Right Ventricle: The right ventricular size is normal. No increase in right ventricular wall thickness. Right ventricular systolic function is normal. Tricuspid regurgitation signal is inadequate for assessing PA pressure. Left Atrium: Left atrial size was normal in size. Right Atrium: Right atrial size was normal in size. Pericardium: Trivial pericardial effusion is present. Mitral Valve: The mitral valve is normal in structure. Trivial mitral valve regurgitation. Tricuspid Valve: The tricuspid valve is normal in structure. Tricuspid valve regurgitation is trivial. Aortic Valve: The aortic valve has an indeterminant number of cusps. Aortic valve  regurgitation is not visualized. Pulmonic Valve: The pulmonic valve was normal in structure. Pulmonic valve regurgitation is not visualized. Aorta: The aortic root and ascending aorta are structurally normal, with no evidence of dilitation. Venous: The inferior vena cava is normal in size with greater than 50% respiratory variability, suggesting right atrial pressure of 3  mmHg. IAS/Shunts: No atrial level shunt detected by color flow Doppler.  LEFT VENTRICLE PLAX 2D LVIDd:         5.10 cm   Diastology LVIDs:         3.30 cm   LV e' medial:    9.03 cm/s LV PW:         1.00 cm   LV E/e' medial:  8.1 LV IVS:        0.90 cm   LV e' lateral:   13.70 cm/s LVOT diam:     2.20 cm   LV E/e' lateral: 5.3 LV SV:         76 LV SV Index:   38 LVOT Area:     3.80 cm  RIGHT VENTRICLE RV S prime:     14.70 cm/s TAPSE (M-mode): 2.0 cm LEFT ATRIUM             Index        RIGHT ATRIUM           Index LA diam:        3.40 cm 1.70 cm/m   RA Area:     17.50 cm LA Vol (A2C):   53.5 ml 26.68 ml/m  RA Volume:   43.50 ml  21.69 ml/m LA Vol (A4C):   41.9 ml 20.89 ml/m LA Biplane Vol: 49.1 ml 24.48 ml/m  AORTIC VALVE LVOT Vmax:   87.00 cm/s LVOT Vmean:  62.800 cm/s LVOT VTI:    0.200 m  AORTA Ao Root diam: 3.00 cm Ao Asc diam:  3.60 cm MITRAL VALVE MV Area (PHT): 3.02 cm     SHUNTS MV Decel Time: 251 msec     Systemic VTI:  0.20 m MV E velocity: 72.80 cm/s   Systemic Diam: 2.20 cm MV A velocity: 102.00 cm/s MV E/A ratio:  0.71 Toribio Fuel MD Electronically signed by Toribio Fuel MD Signature Date/Time: 11/10/2023/6:43:30 PM    Final    CT ABDOMEN PELVIS W CONTRAST Result Date: 11/10/2023 CLINICAL DATA:  Right lower quadrant pain EXAM: CT ABDOMEN AND PELVIS WITH CONTRAST TECHNIQUE: Multidetector CT imaging of the abdomen and pelvis was performed using the standard protocol following bolus administration of intravenous contrast. RADIATION DOSE REDUCTION: This exam was performed according to the departmental dose-optimization  program which includes automated exposure control, adjustment of the mA and/or kV according to patient size and/or use of iterative reconstruction technique. CONTRAST:  60mL OMNIPAQUE  IOHEXOL  350 MG/ML SOLN COMPARISON:  CT abdomen and pelvis 10/17/2014. FINDINGS: Lower chest: There is atelectasis in the right lung base. Hepatobiliary: No focal liver abnormality is seen. No gallstones, gallbladder wall thickening, or biliary dilatation. Pancreas: Unremarkable. No pancreatic ductal dilatation or surrounding inflammatory changes. Spleen: Normal in size without focal abnormality. Adrenals/Urinary Tract: There is a 2 2.0 cm left adrenal nodule which was previously characterized as adenoma. This has slightly decreased in size. The right adrenal gland is within normal limits. The bilateral kidneys and bladder are within normal limits. Stomach/Bowel: There is a moderate-sized hiatal hernia. Stomach is within normal limits. Appendix appears normal. No evidence of bowel wall thickening, distention, or inflammatory changes. Vascular/Lymphatic: Aortic atherosclerosis. No enlarged abdominal or pelvic lymph nodes. Reproductive: Uterus and bilateral adnexa are unremarkable. Other: There is trace free fluid in the pelvis. There is a small fat containing umbilical hernia. Musculoskeletal: No fracture is seen. IMPRESSION: 1. No acute localizing process in the abdomen or pelvis. 2. Trace free fluid in the pelvis. 3. Moderate-sized hiatal  hernia. Aortic Atherosclerosis (ICD10-I70.0). Electronically Signed   By: Greig Pique M.D.   On: 11/10/2023 18:04   DG Chest Port 1 View Result Date: 11/09/2023 CLINICAL DATA:  Chest pain. EXAM: PORTABLE CHEST 1 VIEW COMPARISON:  09/24/2023. FINDINGS: The heart size and mediastinal contours are unchanged. Mild diffuse chronic appearing increased interstitial lung markings. No focal consolidation, pleural effusion, or pneumothorax. No acute osseous abnormality. IMPRESSION: 1. No acute  cardiopulmonary findings. 2. Chronic increased interstitial lung markings. Electronically Signed   By: Harrietta Sherry M.D.   On: 11/09/2023 13:00    Cardiac Studies   Patient Profile   78 y.o. female   Assessment & Plan   1  Chest pain  Pt had severe episode of CP 2 days ago   Radiated actually to R arm.   Not associated wit hactivity       Trop x 2 negative Echo yesterday LVEF and RVEF normal WIth atheroscleorsis will set up for CCTA to evaluate    Note that she had N/V yesterday with abdominal cramping    New   CT showed hiatal hernia     2  Hx HFmr EF  Echo yesterday LVEF and RVEF are normal  3  HL   Crestor  10 added to regimen   Will follow as outpt   OK to d/c from cardiac standpoint   Will arrange for CCTA and follow up  For questions or updates, please contact [facility] Please consult [email or web address] for contact info under     Signed, Vina Gull, MD  11/11/2023, 7:30 AM

## 2023-11-16 ENCOUNTER — Encounter (HOSPITAL_COMMUNITY): Payer: Self-pay

## 2023-11-16 ENCOUNTER — Encounter: Payer: Self-pay | Admitting: Pulmonary Disease

## 2023-11-16 ENCOUNTER — Ambulatory Visit: Admitting: Pulmonary Disease

## 2023-11-16 ENCOUNTER — Other Ambulatory Visit (HOSPITAL_COMMUNITY): Payer: Self-pay | Admitting: *Deleted

## 2023-11-16 VITALS — BP 150/110 | HR 82 | Temp 98.3°F | Ht 64.0 in | Wt 211.0 lb

## 2023-11-16 DIAGNOSIS — Z87891 Personal history of nicotine dependence: Secondary | ICD-10-CM | POA: Diagnosis not present

## 2023-11-16 DIAGNOSIS — K449 Diaphragmatic hernia without obstruction or gangrene: Secondary | ICD-10-CM | POA: Diagnosis not present

## 2023-11-16 DIAGNOSIS — J449 Chronic obstructive pulmonary disease, unspecified: Secondary | ICD-10-CM

## 2023-11-16 DIAGNOSIS — I10 Essential (primary) hypertension: Secondary | ICD-10-CM | POA: Diagnosis not present

## 2023-11-16 DIAGNOSIS — K219 Gastro-esophageal reflux disease without esophagitis: Secondary | ICD-10-CM | POA: Diagnosis not present

## 2023-11-16 DIAGNOSIS — R0602 Shortness of breath: Secondary | ICD-10-CM | POA: Diagnosis not present

## 2023-11-16 MED ORDER — ESOMEPRAZOLE MAGNESIUM 40 MG PO CPDR
40.0000 mg | DELAYED_RELEASE_CAPSULE | Freq: Every day | ORAL | 2 refills | Status: DC
Start: 2023-11-16 — End: 2024-02-10

## 2023-11-16 MED ORDER — METOPROLOL TARTRATE 100 MG PO TABS: ORAL_TABLET | ORAL | 0 refills | Status: DC

## 2023-11-16 NOTE — Patient Instructions (Signed)
 VISIT SUMMARY:  Today, we discussed your recent chest tightness and arm discomfort, which led to a hospital visit. We reviewed your history of COPD and bronchitis, and your current medications. We also talked about your hiatal hernia and its management.  YOUR PLAN:  -CHRONIC OBSTRUCTIVE PULMONARY DISEASE (COPD): COPD is a chronic lung condition that makes it hard to breathe. You will continue using your Symbicort  inhaler twice daily and your albuterol  inhaler as needed. We will also conduct a breathing test to assess your lung function and review the results of your upcoming CT scan before considering additional imaging. Please follow up in 6-8 weeks.  -HIATAL HERNIA: A hiatal hernia occurs when part of the stomach pushes up through the diaphragm. Although you are not experiencing heartburn, we will prescribe medication to reduce stomach acid and advise you to remain upright for 2-3 hours after meals to prevent any potential silent reflux.  INSTRUCTIONS:  Please follow up in 6-8 weeks for a review of your breathing test results and CT scan. Continue using your Symbicort  inhaler twice daily and your albuterol  inhaler as needed. Take the prescribed medication for your hiatal hernia and remain upright for 2-3 hours after meals.

## 2023-11-16 NOTE — Progress Notes (Addendum)
 Subjective:    Patient ID: Kelly Weber, female    DOB: 1945/12/24, 78 y.o.   MRN: [id number]  Patient Care Team: Edman Marsa PARAS, DO as PCP - General (Family Medicine) Okey Vina GAILS, MD as PCP - Cardiology (Cardiology)  Chief Complaint  Patient presents with   Consult    Shortness of breath on exertion. No cough or wheezing.     BACKGROUND: Patient is a 78 year old former smoker, with a history as noted below, who presents for evaluation of shortness of breath on exertion.  She is kindly referred by Dr. Marsa Edman.  HPI Discussed the use of AI scribe software for clinical note transcription with the patient, who gave verbal consent to proceed.  History of Present Illness   Kelly Weber is a 78 year old female with COPD who presents with chest tightness and arm discomfort. She was referred by Dr. Edman for evaluation of potential COPD.  She experienced a sudden onset of chest tightness and arm discomfort while in [place] last week, prompting an ambulance call. Her heart rate dropped to 30 bpm before the ambulance moved, and she received IV fluids. She was hospitalized until Friday.  She has a history of COPD and bronchitis, with several severe episodes of bronchitis in the past. She is currently using Symbicort , a red inhaler, twice daily and an albuterol  inhaler as needed during the day. No recent issues with coughing have been noted.  She quit smoking approximately 12 to 15 years ago. She experiences difficulty walking without stopping to catch her breath and episodes of sweating profusely, particularly during activities like vacuuming.  She is currently undergoing full cardiac workup.  She has been diagnosed with a hiatal hernia but denies experiencing heartburn. She is currently taking a diuretic to manage her water  weight, which she attributes to past steroid use.   She is a retired, no significant occupational exposure.  Lives with her  husband, [place], in [place], [place].     Review of Systems A 10 point review of systems was performed and it is as noted above otherwise negative.   Past Medical History:  Diagnosis Date   Allergies    Anxiety    Arthritis    fingers, left knee   COPD (chronic obstructive pulmonary disease) (HCC)    Depression    Diverticulitis    Dyspnea    Fatigue    GERD (gastroesophageal reflux disease)    HLD (hyperlipidemia)    Insomnia 2006   Lower back pain    Skin cancer 2007   Basal Cell CA resected from Left middle finger    Past Surgical History:  Procedure Laterality Date   BREAST CYST ASPIRATION Bilateral    30 years ago   CATARACT EXTRACTION W/ INTRAOCULAR LENS IMPLANT Bilateral 2023      COLONOSCOPY WITH PROPOFOL  N/A 11/20/2014   Procedure: COLONOSCOPY WITH PROPOFOL ;  Surgeon: Rogelia Copping, MD;  Location: [facility];  Service: Endoscopy;  Laterality: N/A;   COLONOSCOPY WITH PROPOFOL  N/A 06/07/2022   Procedure: COLONOSCOPY WITH PROPOFOL ;  Surgeon: Copping Rogelia, MD;  Location: [facility];  Service: Endoscopy;  Laterality: N/A;   ESOPHAGOGASTRODUODENOSCOPY (EGD) WITH PROPOFOL  N/A 11/20/2014   Procedure: ESOPHAGOGASTRODUODENOSCOPY (EGD) WITH PROPOFOL ;  Surgeon: Rogelia Copping, MD;  Location: [facility];  Service: Endoscopy;  Laterality: N/A;   POLYPECTOMY  11/20/2014   Procedure: POLYPECTOMY;  Surgeon: Rogelia Copping, MD;  Location: [facility];  Service: Endoscopy;;   SKIN CANCER EXCISION Left  finger   TUBAL LIGATION  1970    Patient Active Problem List   Diagnosis Date Noted   Chest pain of uncertain etiology 11/09/2023   History of colonic polyps 06/07/2022   Chronic bilateral low back pain without sciatica 06/13/2020   Osteoporosis of lumbar spine 01/30/2020   Atherosclerosis of aorta (HCC) 10/24/2019   Vitamin D  deficiency 11/23/2018   Elevated hemoglobin A1c 01/29/2018   Hyperlipidemia 09/02/2016   GERD (gastroesophageal reflux  disease) 09/01/2016   Chronic diarrhea 02/09/2016   Anemia 09/29/2015   Morbid obesity (HCC) 08/27/2015   Centrilobular emphysema (HCC) 08/27/2015   Syncope and collapse 08/26/2015   SOB (shortness of breath) on exertion 08/26/2015   Pityriasis rosea 03/12/2015   Seasonal allergies 02/27/2015   Hx of colonic polyps    Benign neoplasm of descending colon    Irritable bowel syndrome (IBS)    Hiatal hernia    Gonalgia 09/23/2014   Arthritis 09/23/2014   Major depression, recurrent, full remission (HCC) 09/23/2014   Dysesthesia 09/23/2014   Insomnia due to medical condition 09/23/2014   Acne 09/23/2014   H/O malignant neoplasm of skin 09/23/2014   Compulsive tobacco user syndrome 09/23/2014   Hematuria, microscopic 09/23/2014   Seborrheic keratoses, inflamed 09/23/2014    Family History  Problem Relation Age of Onset   Brain cancer Mother    Thyroid  disease Mother    Hypertension Mother    Breast cancer Maternal Aunt 26   Breast cancer Cousin 35   Breast cancer Maternal Aunt     Social History   Tobacco Use   Smoking status: Former    Current packs/day: 0.00    Average packs/day: 1 pack/day for 30.0 years (30.0 ttl pk-yrs)    Types: Cigarettes    Start date: 01/20/1984    Quit date: 01/19/2014    Years since quitting: 9.8   Smokeless tobacco: Former  Substance Use Topics   Alcohol use: No    Alcohol/week: 0.0 standard drinks of alcohol    Allergies  Allergen Reactions   Penicillins Hives and Swelling    Current Meds  Medication Sig   albuterol  (PROVENTIL ) (2.5 MG/3ML) 0.083% nebulizer solution Take 3 mLs (2.5 mg total) by nebulization every 4 (four) hours as needed for wheezing or shortness of breath.   albuterol  (VENTOLIN  HFA) 108 (90 Base) MCG/ACT inhaler Inhale 2 puffs into the lungs every 6 (six) hours as needed for wheezing or shortness of breath.   amLODipine  (NORVASC ) 5 MG tablet TAKE 1 TABLET BY MOUTH DAILY   azelastine  (ASTELIN ) 0.1 % nasal spray  Place 2 sprays into both nostrils 2 (two) times daily. Use in each nostril as directed   baclofen  (LIORESAL ) 10 MG tablet Take 0.5-1 tablets (5-10 mg total) by mouth 3 (three) times daily as needed for muscle spasms.   budesonide -formoterol  (SYMBICORT ) 160-4.5 MCG/ACT inhaler Inhale 2 puffs into the lungs 2 (two) times daily.   Calcium  Carbonate-Vit D-Min (CALCIUM  1200 PO) Take by mouth daily.   Ciclopirox  1 % shampoo 2-3 times per week lather on scalp, leave on 5-10 minutes, rinse out   esomeprazole  (NEXIUM ) 40 MG capsule Take 1 capsule (40 mg total) by mouth daily.   fluocinonide  (LIDEX ) 0.05 % external solution Apply 1-2 times daily to affected areas on scalp as needed for itching.   furosemide  (LASIX ) 40 MG tablet Take 1 tablet (40 mg total) by mouth daily as needed for fluid.   TURMERIC PO Take by mouth daily.   valACYclovir  (VALTREX ) 1000  MG tablet Take 2 tablets (2000mg ) twice a day for one day for fever blister flare. May repeat course for future flares.    Immunization History  Administered Date(s) Administered   Fluad Quad(high Dose 65+) 01/29/2019, 01/30/2020, 02/17/2021, 04/27/2022   Influenza, High Dose Seasonal PF 01/18/2014, 02/01/2018   Influenza,inj,Quad PF,6+ Mos 02/06/2013   Influenza-Unspecified 01/18/2014   Pneumococcal Conjugate-13 01/02/2014   Pneumococcal Polysaccharide-23 02/01/2018   Tdap 04/13/2011        Objective:     BP (!) 150/110 (BP Location: Left Arm, Patient Position: Sitting, Cuff Size: Normal)   Pulse 82   Temp 98.3 F (36.8 C) (Oral)   Ht 5' 4 (1.626 m)   Wt 211 lb (95.7 kg)   SpO2 96%   BMI 36.22 kg/m   SpO2: 96 %  GENERAL: Obese woman, no acute distress, fully ambulatory, no conversational dyspnea. HEAD: Normocephalic, atraumatic.  EYES: Pupils equal, round, reactive to light.  No scleral icterus.  MOUTH: Poor dentition, oral mucosa moist.  No thrush. NECK: Supple. No thyromegaly. Trachea midline. No JVD.  No adenopathy. PULMONARY:  Good air entry bilaterally.  Coarse, otherwise, no adventitious sounds. CARDIOVASCULAR: S1 and S2. Regular rate and rhythm.  No rubs, murmurs or gallops heard. ABDOMEN: Obese, otherwise benign. MUSCULOSKELETAL: No joint deformity, no clubbing, no edema.  NEUROLOGIC: No overt focal deficit, no gait disturbance, speech is fluent. SKIN: Intact,warm,dry. PSYCH: Mood and behavior normal.  Reviewed recent laboratory data.  CT abdomen pelvis performed 31 July shows a moderate to large hiatal hernia.     Assessment & Plan:     ICD-10-CM   1. COPD suggested by initial evaluation (HCC)  J44.9 Pulmonary function test    2. Shortness of breath  R06.02 Pulmonary function test    3. Hiatal hernia with gastroesophageal reflux  K44.9    K21.9     4. Hypertension, unspecified type  I10       Orders Placed This Encounter  Procedures   Pulmonary function test    Standing Status:   Future    Expiration Date:   11/15/2024    Where should this test be performed?:   Outpatient Pulmonary    What type of PFT is being ordered?:   Full PFT    Meds ordered this encounter  Medications   esomeprazole  (NEXIUM ) 40 MG capsule    Sig: Take 1 capsule (40 mg total) by mouth daily.    Dispense:  30 capsule    Refill:  2   Discussion:    Chronic obstructive pulmonary disease (COPD) COPD with previous episodes of bronchitis, currently experiencing exertional dyspnea. No recent cough. Recent hospitalization for cardiac evaluation due to bradycardia and chest and arm tightness.  Undergoing further cardiac evaluation. - Order pulmonary function test to assess lung function - Continue Symbicort  inhaler twice daily - Use albuterol  inhaler as needed - Evaluate results of upcoming cardiac CT scan on August 11 before considering additional imaging - Follow up in 6-8 weeks  Hiatal hernia Diagnosed with hiatal hernia, currently asymptomatic for heartburn but potential for silent reflux which can aggravate  symptoms - Trial of Nexium  daily - Advise to remain upright for 2-3 hours after meals, observe other antireflux measures    Hypertension Defer management to primary physician and cardiology.   Advised if symptoms do not improve or worsen, to please contact office for sooner follow up or seek emergency care.    I spent 45 minutes of dedicated to the care of this  patient on the date of this encounter to include pre-visit review of records, face-to-face time with the patient discussing conditions above, post visit ordering of testing, clinical documentation with the electronic health record, making appropriate referrals as documented, and communicating necessary findings to members of the patients care team.   C. Leita Sanders, MD Advanced Bronchoscopy PCCM [facility]    *This note was dictated using voice recognition software/Dragon.  Despite best efforts to proofread, errors can occur which can change the meaning. Any transcriptional errors that result from this process are unintentional and may not be fully corrected at the time of dictation.

## 2023-11-18 ENCOUNTER — Telehealth (HOSPITAL_COMMUNITY): Payer: Self-pay | Admitting: Emergency Medicine

## 2023-11-18 NOTE — Telephone Encounter (Signed)
 Reaching out to patient to offer assistance regarding upcoming cardiac imaging study; pt verbalizes understanding of appt date/time, parking situation and where to check in, pre-test NPO status and medications ordered, and verified current allergies; name and call back number provided for further questions should they arise Kelly Alexandria RN Navigator Cardiac Imaging [facility] [phone] office [phone] cell

## 2023-11-21 ENCOUNTER — Ambulatory Visit (HOSPITAL_COMMUNITY)
Admission: RE | Admit: 2023-11-21 | Discharge: 2023-11-21 | Disposition: A | Discharge status: Home / Self Care | Source: Ambulatory Visit | Attending: Cardiology | Admitting: Cardiology

## 2023-11-21 DIAGNOSIS — I25118 Atherosclerotic heart disease of native coronary artery with other forms of angina pectoris: Secondary | ICD-10-CM | POA: Insufficient documentation

## 2023-11-21 DIAGNOSIS — I2089 Other forms of angina pectoris: Secondary | ICD-10-CM | POA: Diagnosis not present

## 2023-11-21 MED ORDER — NITROGLYCERIN 0.4 MG SL SUBL
0.8000 mg | SUBLINGUAL_TABLET | Freq: Once | SUBLINGUAL | Status: AC
  Administered 2023-11-21 (×2): 0.8 mg via SUBLINGUAL

## 2023-11-21 MED ORDER — IOHEXOL 350 MG/ML SOLN
100.0000 mL | Freq: Once | INTRAVENOUS | Status: AC | PRN
  Administered 2023-11-21 (×2): 100 mL via INTRAVENOUS

## 2023-11-24 ENCOUNTER — Ambulatory Visit: Payer: Self-pay | Admitting: Internal Medicine

## 2023-12-05 ENCOUNTER — Ambulatory Visit

## 2023-12-22 ENCOUNTER — Other Ambulatory Visit: Payer: Self-pay | Admitting: Family Medicine

## 2023-12-22 DIAGNOSIS — R600 Localized edema: Secondary | ICD-10-CM

## 2023-12-22 DIAGNOSIS — R0601 Orthopnea: Secondary | ICD-10-CM

## 2023-12-22 NOTE — Telephone Encounter (Signed)
 Requested Prescriptions  Pending Prescriptions Disp Refills   furosemide  (LASIX ) 40 MG tablet [Pharmacy Med Name: FUROSEMIDE  40 MG TABLET] 90 tablet 1    Sig: TAKE 1 TABLET BY MOUTH DAILY AS NEEDED FOR FLUID     Cardiovascular:  Diuretics - Loop Failed - 12/22/2023  5:39 PM      Failed - Ca in normal range and within 180 days    Calcium   Date Value Ref Range Status  11/10/2023 8.1 (L) 8.9 - 10.3 mg/dL Final   Calcium , Total  Date Value Ref Range Status  02/14/2014 8.1 (L) 8.5 - 10.1 mg/dL Final         Failed - Cr in normal range and within 180 days    Creat  Date Value Ref Range Status  01/06/2023 0.84 0.60 - 1.00 mg/dL Final   Creatinine, Ser  Date Value Ref Range Status  11/10/2023 1.25 (H) 0.44 - 1.00 mg/dL Final         Failed - Mg Level in normal range and within 180 days    No results found for: MG       Passed - K in normal range and within 180 days    Potassium  Date Value Ref Range Status  11/10/2023 3.9 3.5 - 5.1 mmol/L Final  02/14/2014 4.2 3.5 - 5.1 mmol/L Final         Passed - Na in normal range and within 180 days    Sodium  Date Value Ref Range Status  11/10/2023 141 135 - 145 mmol/L Final  05/29/2018 141 134 - 144 mmol/L Final  02/14/2014 141 136 - 145 mmol/L Final         Passed - Cl in normal range and within 180 days    Chloride  Date Value Ref Range Status  11/10/2023 108 98 - 111 mmol/L Final  02/14/2014 107 98 - 107 mmol/L Final         Passed - Last BP in normal range    BP Readings from Last 1 Encounters:  11/21/23 109/66         Passed - Valid encounter within last 6 months    Recent Outpatient Visits           2 months ago Peripheral edema   [place] [facility] [place], Angeline ORN, NP   2 months ago Bilateral lower extremity edema   [place] [facility] Edman Marsa PARAS, DO       Future Appointments             In 1 month Edman, Marsa PARAS, DO [facility], [place]   In 1 month Okey Vina GAILS, MD [facility] A Dept of [facility] [place]. [facility], [facility]   In 5 months Jackquline Sawyer, MD [facility]

## 2024-01-10 ENCOUNTER — Ambulatory Visit
Admission: RE | Admit: 2024-01-10 | Discharge: 2024-01-10 | Disposition: A | Discharge status: Home / Self Care | Source: Ambulatory Visit | Attending: Family Medicine | Admitting: Family Medicine

## 2024-01-10 DIAGNOSIS — R928 Other abnormal and inconclusive findings on diagnostic imaging of breast: Secondary | ICD-10-CM

## 2024-01-10 DIAGNOSIS — N6011 Diffuse cystic mastopathy of right breast: Secondary | ICD-10-CM | POA: Diagnosis not present

## 2024-01-12 ENCOUNTER — Ambulatory Visit
Admission: RE | Admit: 2024-01-12 | Discharge: 2024-01-12 | Disposition: A | Discharge status: Home / Self Care | Attending: Pulmonary Disease | Admitting: Pulmonary Disease

## 2024-01-12 ENCOUNTER — Other Ambulatory Visit: Payer: Self-pay

## 2024-01-12 ENCOUNTER — Ambulatory Visit
Admission: RE | Admit: 2024-01-12 | Discharge: 2024-01-12 | Disposition: A | Discharge status: Home / Self Care | Source: Ambulatory Visit | Attending: Pulmonary Disease | Admitting: Pulmonary Disease

## 2024-01-12 ENCOUNTER — Ambulatory Visit

## 2024-01-12 ENCOUNTER — Ambulatory Visit: Admitting: Pulmonary Disease

## 2024-01-12 ENCOUNTER — Encounter: Payer: Self-pay | Admitting: Pulmonary Disease

## 2024-01-12 VITALS — BP 120/88 | HR 74 | Temp 97.9°F | Ht 64.0 in | Wt 211.0 lb

## 2024-01-12 DIAGNOSIS — R0602 Shortness of breath: Secondary | ICD-10-CM

## 2024-01-12 DIAGNOSIS — R0989 Other specified symptoms and signs involving the circulatory and respiratory systems: Secondary | ICD-10-CM | POA: Diagnosis not present

## 2024-01-12 DIAGNOSIS — J440 Chronic obstructive pulmonary disease with acute lower respiratory infection: Secondary | ICD-10-CM

## 2024-01-12 DIAGNOSIS — Z789 Other specified health status: Secondary | ICD-10-CM

## 2024-01-12 DIAGNOSIS — R059 Cough, unspecified: Secondary | ICD-10-CM | POA: Diagnosis not present

## 2024-01-12 LAB — POC COVID19 BINAXNOW: SARS Coronavirus 2 Ag: NEGATIVE

## 2024-01-12 MED ORDER — METHYLPREDNISOLONE 4 MG PO TBPK: ORAL_TABLET | ORAL | 0 refills | Status: DC

## 2024-01-12 MED ORDER — AZITHROMYCIN 250 MG PO TABS: ORAL_TABLET | ORAL | 0 refills | Status: DC

## 2024-01-12 NOTE — Patient Instructions (Signed)
 VISIT SUMMARY:  Today, you were seen for symptoms of cough and sinus congestion that have been ongoing for the past two weeks. You reported difficulty sleeping due to breathing struggles and a productive cough with yellow sputum. After a thorough examination, we discussed your current health issues and created a plan to address them.  YOUR PLAN:  -CHRONIC OBSTRUCTIVE PULMONARY DISEASE (COPD) WITH ACUTE EXACERBATION: COPD is a chronic lung condition that makes it hard to breathe. You are experiencing an acute exacerbation, which means your symptoms have suddenly worsened. We suspect you might have pneumonia, so we have prescribed an antibiotic and prednisone  to help reduce inflammation and fight any potential infection. A chest x-ray has been ordered to check for pneumonia.  -ACUTE UPPER RESPIRATORY INFECTION WITH COUGH AND SINUS CONGESTION: An acute upper respiratory infection is a viral or bacterial infection that affects your nose, throat, and airways. Your symptoms include sinus congestion and a persistent cough. We have prescribed an antibiotic and prednisone  to help manage these symptoms and reduce inflammation.  -SHORTNESS OF BREATH: Shortness of breath can be caused by various conditions, including infections and chronic lung diseases like COPD. Your shortness of breath is likely due to your upper respiratory infection and COPD exacerbation. We have ordered a chest x-ray to further investigate the cause and ensure there is no pneumonia.   INSTRUCTIONS:  Please take the prescribed medications as directed. Schedule a follow-up appointment after completing the medications or sooner if your symptoms worsen. Ensure you get the chest x-ray done as soon as possible and follow up with the results.

## 2024-01-12 NOTE — Progress Notes (Signed)
 Subjective:    Patient ID: Kelly Weber, female    DOB: 1945-06-06, 78 y.o.   MRN: [id number]  Patient Care Team: [place] Marsa PARAS, DO as PCP - General (Family Medicine) Okey Vina GAILS, MD as PCP - Cardiology (Cardiology)  Chief Complaint  Patient presents with   COPD    Cough, post nasal drip x 2 weeks.     BACKGROUND/INTERVAL:Patient is a 78 year old former smoker, with a history as noted below, who presents for follow-up of shortness of breath on exertion.  She was initially seen here on 16 November 2023.  HPI Discussed the use of AI scribe software for clinical note transcription with the patient, who gave verbal consent to proceed.  History of Present Illness   Kelly Weber is a 78 year old female with COPD who presents with acute symptoms of cough and sinus congestion.  She has been experiencing sinus drainage for the past two weeks, which leads to a cough. The drainage goes down her throat, causing irritation and a productive cough with yellow sputum. Talking exacerbates the coughing.  She has difficulty sleeping, stating she 'can't lay down' due to breathing struggles.  She has had to sleep in a recliner over the last 2 weeks.  She has recently traveled to [place] [facility] with a group and returned with symptoms of cough.  She has not had any fevers, chills or sweats.  Had not tested for COVID-19.  She was due for PFTs today but was unable to perform them due to cough.  Had to be rescheduled.  Patient is allergic to penicillin.     Review of Systems A 10 point review of systems was performed and it is as noted above otherwise negative.   Patient Active Problem List   Diagnosis Date Noted   Chest pain of uncertain etiology 11/09/2023   History of colonic polyps 06/07/2022   Chronic bilateral low back pain without sciatica 06/13/2020   Osteoporosis of lumbar spine 01/30/2020   Atherosclerosis of aorta 10/24/2019   Vitamin D  deficiency 11/23/2018   Elevated hemoglobin  A1c 01/29/2018   Hyperlipidemia 09/02/2016   GERD (gastroesophageal reflux disease) 09/01/2016   Chronic diarrhea 02/09/2016   Anemia 09/29/2015   Morbid obesity (HCC) 08/27/2015   Centrilobular emphysema (HCC) 08/27/2015   Syncope and collapse 08/26/2015   SOB (shortness of breath) on exertion 08/26/2015   Pityriasis rosea 03/12/2015   Seasonal allergies 02/27/2015   Hx of colonic polyps    Benign neoplasm of descending colon    Irritable bowel syndrome (IBS)    Hiatal hernia    Gonalgia 09/23/2014   Arthritis 09/23/2014   Major depression, recurrent, full remission 09/23/2014   Dysesthesia 09/23/2014   Insomnia due to medical condition 09/23/2014   Acne 09/23/2014   H/O malignant neoplasm of skin 09/23/2014   Compulsive tobacco user syndrome 09/23/2014   Hematuria, microscopic 09/23/2014   Seborrheic keratoses, inflamed 09/23/2014    Social History   Tobacco Use   Smoking status: Former    Current packs/day: 0.00    Average packs/day: 1 pack/day for 30.0 years (30.0 ttl pk-yrs)    Types: Cigarettes    Start date: 01/20/1984    Quit date: 01/19/2014    Years since quitting: 9.9   Smokeless tobacco: Former  Substance Use Topics   Alcohol use: No    Alcohol/week: 0.0 standard drinks of alcohol    Allergies  Allergen Reactions   Penicillins Hives and Swelling    Current Meds  Medication Sig   albuterol  (PROVENTIL ) (2.5 MG/3ML) 0.083% nebulizer solution Take 3 mLs (2.5 mg total) by nebulization every 4 (four) hours as needed for wheezing or shortness of breath.   albuterol  (VENTOLIN  HFA) 108 (90 Base) MCG/ACT inhaler Inhale 2 puffs into the lungs every 6 (six) hours as needed for wheezing or shortness of breath.   amLODipine  (NORVASC ) 5 MG tablet TAKE 1 TABLET BY MOUTH DAILY   azithromycin  (ZITHROMAX ) 250 MG tablet Take 2 tablets (500 mg) on  Day 1,  followed by 1 tablet (250 mg) once daily on Days 2 through 5.   budesonide -formoterol  (SYMBICORT ) 160-4.5 MCG/ACT  inhaler Inhale 2 puffs into the lungs 2 (two) times daily.   Calcium  Carbonate-Vit D-Min (CALCIUM  1200 PO) Take by mouth daily.   Ciclopirox  1 % shampoo 2-3 times per week lather on scalp, leave on 5-10 minutes, rinse out   esomeprazole  (NEXIUM ) 40 MG capsule Take 1 capsule (40 mg total) by mouth daily.   fluocinonide  (LIDEX ) 0.05 % external solution Apply 1-2 times daily to affected areas on scalp as needed for itching.   furosemide  (LASIX ) 40 MG tablet TAKE 1 TABLET BY MOUTH DAILY AS NEEDED FOR FLUID   methylPREDNISolone  (MEDROL  DOSEPAK) 4 MG TBPK tablet Take as directed in the package.   metoprolol  tartrate (LOPRESSOR ) 100 MG tablet Take tablet (100mg ) TWO hours prior to your cardiac CT scan.   TURMERIC PO Take by mouth daily.   valACYclovir  (VALTREX ) 1000 MG tablet Take 2 tablets (2000mg ) twice a day for one day for fever blister flare. May repeat course for future flares. (Patient taking differently: as needed. Take 2 tablets (2000mg ) twice a day for one day for fever blister flare. May repeat course for future flares.)    Immunization History  Administered Date(s) Administered   Fluad Quad(high Dose 65+) 01/29/2019, 01/30/2020, 02/17/2021, 04/27/2022   INFLUENZA, HIGH DOSE SEASONAL PF 01/18/2014, 02/01/2018   Influenza,inj,Quad PF,6+ Mos 02/06/2013   Influenza-Unspecified 01/18/2014   Pneumococcal Conjugate-13 01/02/2014   Pneumococcal Polysaccharide-23 02/01/2018   Tdap 04/13/2011        Objective:     BP 120/88   Pulse 74   Temp 97.9 F (36.6 C) (Oral)   Ht 5' 4 (1.626 m)   Wt 211 lb (95.7 kg)   SpO2 95%   BMI 36.22 kg/m   SpO2: 95 %  GENERAL: Obese woman, no acute distress, fully ambulatory, no conversational dyspnea. HEAD: Normocephalic, atraumatic.  EYES: Pupils equal, round, reactive to light.  No scleral icterus.  MOUTH: Poor dentition, oral mucosa moist.  No thrush. NECK: Supple. No thyromegaly. Trachea midline. No JVD.  No adenopathy. PULMONARY: Good air  entry bilaterally.  Coarse, otherwise, rhonchi noted most pronounced on the right lower lung field. CARDIOVASCULAR: S1 and S2. Regular rate and rhythm.  No rubs, murmurs or gallops heard. ABDOMEN: Obese, otherwise benign. MUSCULOSKELETAL: No joint deformity, no clubbing, no edema.  NEUROLOGIC: No overt focal deficit, no gait disturbance, speech is fluent. SKIN: Intact,warm,dry. PSYCH: Mood and behavior normal.  POC COVID 19 test: Negative    Assessment & Plan:     ICD-10-CM   1. Shortness of breath  R06.02 POC COVID-19 BinaxNow    DG Chest 2 View    2. Chest congestion  R09.89 POC COVID-19 BinaxNow    DG Chest 2 View    3. Recent domestic travel  Z78.9 POC COVID-19 BinaxNow    DG Chest 2 View      Orders Placed This Encounter  Procedures   DG Chest 2 View    Standing Status:   Future    Expected Date:   01/12/2024    Expiration Date:   01/11/2025    Reason for Exam (SYMPTOM  OR DIAGNOSIS REQUIRED):   Cough,shortness of breath    Preferred imaging location?:   [facility]   POC COVID-19 BinaxNow    Meds ordered this encounter  Medications   azithromycin  (ZITHROMAX ) 250 MG tablet    Sig: Take 2 tablets (500 mg) on  Day 1,  followed by 1 tablet (250 mg) once daily on Days 2 through 5.    Dispense:  6 each    Refill:  0   methylPREDNISolone  (MEDROL  DOSEPAK) 4 MG TBPK tablet    Sig: Take as directed in the package.    Dispense:  21 tablet    Refill:  0   Discussion    Chronic obstructive pulmonary disease (COPD) with acute exacerbation COPD exacerbation with symptoms of cough and yellow sputum production. Chest auscultation revealed increased rhonchi on the right lower lung field, raising suspicion for pneumonia/lower respiratory infection. - Prescribe antibiotic (azithromycin ) - Prescribe steroid (Medrol  Dosepak - Order chest x-ray to evaluate for pneumonia  Acute upper respiratory infection with cough and sinus congestion Acute upper respiratory infection  with symptoms of sinus congestion and cough persisting for two weeks, causing difficulty in lying down and leading to coughing fits. - Prescribe antibiotic - Prescribe prednisone   Shortness of breath Shortness of breath associated with acute upper respiratory infection and COPD exacerbation, worsening when talking and lying down. - Order chest x-ray to evaluate for pneumonia   Patient will have PFTs rescheduled.  Will see her in follow-up in 2 to 3 weeks time she is to contact us  prior to that time for any new difficulties arise.    Advised if symptoms do not improve or worsen, to please contact office for sooner follow up or seek emergency care.    I spent 40 minutes of dedicated to the care of this patient on the date of this encounter to include pre-visit review of records, face-to-face time with the patient discussing conditions above, post visit ordering of testing, clinical documentation with the electronic health record, making appropriate referrals as documented, and communicating necessary findings to members of the patients care team.     C. Leita Sanders, MD [facility]    *This note was generated using voice recognition software/Dragon and/or AI transcription program.  Despite best efforts to proofread, errors can occur which can change the meaning. Any transcriptional errors that result from this process are unintentional and may not be fully corrected at the time of dictation.

## 2024-01-17 ENCOUNTER — Other Ambulatory Visit: Payer: Self-pay | Admitting: Family Medicine

## 2024-01-17 ENCOUNTER — Telehealth: Payer: Self-pay

## 2024-01-17 DIAGNOSIS — J432 Centrilobular emphysema: Secondary | ICD-10-CM

## 2024-01-17 DIAGNOSIS — E559 Vitamin D deficiency, unspecified: Secondary | ICD-10-CM

## 2024-01-17 DIAGNOSIS — E7800 Pure hypercholesterolemia, unspecified: Secondary | ICD-10-CM

## 2024-01-17 DIAGNOSIS — Z0000 Encounter for general adult medical examination without abnormal findings: Secondary | ICD-10-CM

## 2024-01-17 DIAGNOSIS — R7309 Other abnormal glucose: Secondary | ICD-10-CM

## 2024-01-17 NOTE — Telephone Encounter (Signed)
 I notified the patient. She said when she is up moving around she has a cough with clear sputum.   Per Dr. Tamea- finish prednisone , rest and hydrate. This will take some time to resolve.  I have notified the patient.  Nothing further needed.

## 2024-01-17 NOTE — Telephone Encounter (Signed)
 Her chest x-ray did not show any pneumonia.  However, the radiologist has not read it yet.  But there is no pneumonia present.  She needs to complete the prednisone  course.  Is she still coughing up anything up?

## 2024-01-17 NOTE — Telephone Encounter (Signed)
 Copied from CRM #[id number]. Topic: Clinical - Lab/Test Results >> Jan 17, 2024  8:45 AM Joesph PARAS wrote: Reason for CRM: Patient is calling to request that her x-ray be resulted immediately and results be relayed to her. Patient also states she is on her last pill today (did not specify) and needs to know if she needs more medicine. States she is not better.

## 2024-01-18 ENCOUNTER — Ambulatory Visit: Payer: Self-pay | Admitting: Pulmonary Disease

## 2024-01-18 ENCOUNTER — Other Ambulatory Visit

## 2024-01-18 DIAGNOSIS — J432 Centrilobular emphysema: Secondary | ICD-10-CM | POA: Diagnosis not present

## 2024-01-18 DIAGNOSIS — E7800 Pure hypercholesterolemia, unspecified: Secondary | ICD-10-CM | POA: Diagnosis not present

## 2024-01-18 DIAGNOSIS — Z0000 Encounter for general adult medical examination without abnormal findings: Secondary | ICD-10-CM | POA: Diagnosis not present

## 2024-01-18 DIAGNOSIS — E559 Vitamin D deficiency, unspecified: Secondary | ICD-10-CM | POA: Diagnosis not present

## 2024-01-18 DIAGNOSIS — R7309 Other abnormal glucose: Secondary | ICD-10-CM | POA: Diagnosis not present

## 2024-01-19 ENCOUNTER — Other Ambulatory Visit: Payer: Self-pay | Admitting: Family Medicine

## 2024-01-19 DIAGNOSIS — R030 Elevated blood-pressure reading, without diagnosis of hypertension: Secondary | ICD-10-CM

## 2024-01-19 LAB — CBC WITH DIFFERENTIAL/PLATELET
Absolute Lymphocytes: 2361 {cells}/uL (ref 850–3900)
Absolute Monocytes: 400 {cells}/uL (ref 200–950)
Basophils Absolute: 30 {cells}/uL (ref 0–200)
Basophils Relative: 0.4 %
Eosinophils Absolute: 81 {cells}/uL (ref 15–500)
Eosinophils Relative: 1.1 %
HCT: 36.1 % (ref 35.0–45.0)
Hemoglobin: 11.2 g/dL — ABNORMAL LOW (ref 11.7–15.5)
MCH: 27 pg (ref 27.0–33.0)
MCHC: 31 g/dL — ABNORMAL LOW (ref 32.0–36.0)
MCV: 87 fL (ref 80.0–100.0)
MPV: 10.7 fL (ref 7.5–12.5)
Monocytes Relative: 5.4 %
Neutro Abs: 4529 {cells}/uL (ref 1500–7800)
Neutrophils Relative %: 61.2 %
Platelets: 346 Thousand/uL (ref 140–400)
RBC: 4.15 Million/uL (ref 3.80–5.10)
RDW: 13.7 % (ref 11.0–15.0)
Total Lymphocyte: 31.9 %
WBC: 7.4 Thousand/uL (ref 3.8–10.8)

## 2024-01-19 LAB — COMPREHENSIVE METABOLIC PANEL WITH GFR
AG Ratio: 1.8 (calc) (ref 1.0–2.5)
ALT: 16 U/L (ref 6–29)
AST: 22 U/L (ref 10–35)
Albumin: 4.2 g/dL (ref 3.6–5.1)
Alkaline phosphatase (APISO): 63 U/L (ref 37–153)
BUN/Creatinine Ratio: 20 (calc) (ref 6–22)
BUN: 21 mg/dL (ref 7–25)
CO2: 24 mmol/L (ref 20–32)
Calcium: 8.8 mg/dL (ref 8.6–10.4)
Chloride: 104 mmol/L (ref 98–110)
Creat: 1.05 mg/dL — ABNORMAL HIGH (ref 0.60–1.00)
Globulin: 2.3 g/dL (ref 1.9–3.7)
Glucose, Bld: 95 mg/dL (ref 65–99)
Potassium: 4 mmol/L (ref 3.5–5.3)
Sodium: 140 mmol/L (ref 135–146)
Total Bilirubin: 0.5 mg/dL (ref 0.2–1.2)
Total Protein: 6.5 g/dL (ref 6.1–8.1)
eGFR: 54 mL/min/1.73m2 — ABNORMAL LOW (ref >=60)

## 2024-01-19 LAB — LIPID PANEL
Cholesterol: 168 mg/dL (ref <200)
HDL: 65 mg/dL (ref >=50)
LDL Cholesterol (Calc): 88 mg/dL
Non-HDL Cholesterol (Calc): 103 mg/dL (ref <130)
Total CHOL/HDL Ratio: 2.6 (calc) (ref <5.0)
Triglycerides: 64 mg/dL (ref <150)

## 2024-01-19 LAB — HEMOGLOBIN A1C
Hgb A1c MFr Bld: 5.6 % (ref <5.7)
Mean Plasma Glucose: 114 mg/dL
eAG (mmol/L): 6.3 mmol/L

## 2024-01-19 LAB — VITAMIN D 25 HYDROXY (VIT D DEFICIENCY, FRACTURES): Vit D, 25-Hydroxy: 41 ng/mL (ref 30–100)

## 2024-01-19 LAB — TSH: TSH: 0.87 m[IU]/L (ref 0.40–4.50)

## 2024-01-20 ENCOUNTER — Encounter: Payer: Self-pay | Admitting: Family Medicine

## 2024-01-20 ENCOUNTER — Ambulatory Visit: Payer: Self-pay | Admitting: Internal Medicine

## 2024-01-20 NOTE — Telephone Encounter (Signed)
 Requested Prescriptions  Pending Prescriptions Disp Refills   amLODipine  (NORVASC ) 5 MG tablet [Pharmacy Med Name: amLODIPine  BESYLATE 5 MG TAB] 90 tablet 0    Sig: TAKE 1 TABLET BY MOUTH DAILY     Cardiovascular: Calcium  Channel Blockers 2 Passed - 01/20/2024  3:54 PM      Passed - Last BP in normal range    BP Readings from Last 1 Encounters:  01/12/24 120/88         Passed - Last Heart Rate in normal range    Pulse Readings from Last 1 Encounters:  01/12/24 74         Passed - Valid encounter within last 6 months    Recent Outpatient Visits           3 months ago Peripheral edema   [place] [facility] [place], Angeline ORN, NP   3 months ago Bilateral lower extremity edema   [place] [facility] [place], Marsa PARAS, DO       Future Appointments             In 5 days Edman, Marsa PARAS, DO [place] [facility], [place]   In 2 weeks Okey Vina GAILS, MD [facility] at [facility] A Dept of [facility] [employer]. [facility] [employer], [facility]   In 4 months Jackquline Sawyer, MD [facility]

## 2024-01-24 DIAGNOSIS — M810 Age-related osteoporosis without current pathological fracture: Secondary | ICD-10-CM | POA: Diagnosis not present

## 2024-01-24 DIAGNOSIS — E559 Vitamin D deficiency, unspecified: Secondary | ICD-10-CM | POA: Diagnosis not present

## 2024-01-25 ENCOUNTER — Ambulatory Visit: Admitting: Family Medicine

## 2024-01-25 ENCOUNTER — Encounter: Payer: Self-pay | Admitting: Family Medicine

## 2024-01-25 VITALS — BP 132/82 | HR 87 | Ht 64.0 in | Wt 211.1 lb

## 2024-01-25 DIAGNOSIS — F3342 Major depressive disorder, recurrent, in full remission: Secondary | ICD-10-CM | POA: Diagnosis not present

## 2024-01-25 DIAGNOSIS — E559 Vitamin D deficiency, unspecified: Secondary | ICD-10-CM

## 2024-01-25 DIAGNOSIS — M810 Age-related osteoporosis without current pathological fracture: Secondary | ICD-10-CM | POA: Diagnosis not present

## 2024-01-25 DIAGNOSIS — E7800 Pure hypercholesterolemia, unspecified: Secondary | ICD-10-CM

## 2024-01-25 DIAGNOSIS — N1831 Chronic kidney disease, stage 3a: Secondary | ICD-10-CM | POA: Diagnosis not present

## 2024-01-25 DIAGNOSIS — Z0000 Encounter for general adult medical examination without abnormal findings: Secondary | ICD-10-CM | POA: Diagnosis not present

## 2024-01-25 DIAGNOSIS — J432 Centrilobular emphysema: Secondary | ICD-10-CM

## 2024-01-25 DIAGNOSIS — R7309 Other abnormal glucose: Secondary | ICD-10-CM | POA: Diagnosis not present

## 2024-01-25 NOTE — Progress Notes (Incomplete)
 Subjective:    Patient ID: Kelly Weber, female    DOB: 04/01/1946, 78 y.o.   MRN: [id number]  Kelly Weber is a 78 y.o. female presenting on 01/25/2024 for Annual Exam   HPI  Discussed the use of AI scribe software for clinical note transcription with the patient, who gave verbal consent to proceed.  History of Present Illness   Kelly Weber is a 78 year old female who presents for an annual physical exam.  Osteoporosis and osteopenia - Osteoporosis and osteopenia involving the wrist and hip - Receives annual Reclast  infusions for osteoporosis; next infusion scheduled for next week  Renal function and back pain - Recent creatinine 1.05 and EGFR 54 - Intermittent back pain, initially suspected to be renal in origin - Back pain relieved with use of a heating pad  Weight fluctuations and weight management - Significant weight fluctuations - Currently participating in Weight Watchers - Current weight 210-211 pounds, down from 225 pounds - Previously lost 45 pounds with Weight Watchers while working  Anemia - History of anemia - Current hemoglobin 11.2 - Takes daily iron supplements  Cardiac symptoms and hospitalization - Past episode of chest tightness and arm numbness - Hospitalized for evaluation; extensive cardiac testing performed - Low heart rate of 30 identified during hospitalization  Respiratory symptoms - Persistent cough and voice loss following recent hospitalization - Reduced activity level to facilitate recovery  Foot nodule - Nodule on foot, noticeable when standing for long periods - Located near previous ankle injury site       Pre-Diabetes A1c down to 5.6, improved from 5.8  HFU History of Chest Tightness and arm numbness Went to hospital ED by EMS after had low HR 30s and near syncope, chest pain Cardiac Morphology  Recurrent Bronchitis Followed by Pulmonology [facility] Completed course of treatment Now with time and rest she has  improved ***  Hyperlipidemia Off Rosuvastatin , intolerance   CKD-III Creatinine improved to 1.05, from 1.25, eGFR 54  Morbid Obesity BMI >36 Weight down from 220-225 down to 211 lbs On Weight Watchers program  Bronchitis / COPD / Dyspnea Followed by Dr Tamea  Left Foot Pain / Swelling Followed by Dr Ashley [facility] L foot   Osteoporosis Followed by Dr Cherilyn for Endocrinology Last visit yesterday 01/24/24, osteopenia in hip and osteoporosis in wrist On Reclast  infusion yearly Next DEXA 2026   Centrilobular Emphysema (COPD) Improved on therapy.   Right Hip Pain, limited her mobility   Aortic Atherosclerosis On imaging   Health Maintenance: Mammogram done July 2025, birads 0, had US  and diagnostic then repeat 1 year     06/24/2023    3:25 PM 04/26/2023    2:09 PM 01/12/2023    3:32 PM  Depression screen PHQ 2/9  Decreased Interest 0 2 0  Down, Depressed, Hopeless 0 2 0  PHQ - 2 Score 0 4 0  Altered sleeping 0 0 0  Tired, decreased energy 0 1 1  Change in appetite 0 0 1  Feeling bad or failure about yourself  0 0 0  Trouble concentrating 0 0 0  Moving slowly or fidgety/restless 0 1 0  Suicidal thoughts 0 0 0  PHQ-9 Score 0 6 2  Difficult doing work/chores   Not difficult at all       04/26/2023    2:09 PM 01/12/2023    3:32 PM 12/03/2022    1:59 PM 06/11/2022    3:40 PM  GAD 7 :  Generalized Anxiety Score  Nervous, Anxious, on Edge 0 1 1 1   Control/stop worrying 1 0 1 2  Worry too much - different things 1 1 2 2   Trouble relaxing 1 1 1 1   Restless 0 1 1 0  Easily annoyed or irritable 1 0 1 1  Afraid - awful might happen 0 0 1 1  Total GAD 7 Score 4 4 8 8   Anxiety Difficulty   Somewhat difficult      Past Medical History:  Diagnosis Date  . Allergies   . Anxiety   . Arthritis    fingers, left knee  . COPD (chronic obstructive pulmonary disease) (HCC)   . Depression   . Diverticulitis   . Dyspnea   . Fatigue   . GERD  (gastroesophageal reflux disease)   . HLD (hyperlipidemia)   . Insomnia 2006  . Lower back pain   . Skin cancer 2007   Basal Cell CA resected from Left middle finger   Past Surgical History:  Procedure Laterality Date  . BREAST CYST ASPIRATION Bilateral    30 years ago  . CATARACT EXTRACTION W/ INTRAOCULAR LENS IMPLANT Bilateral 2023   [place]  . COLONOSCOPY WITH PROPOFOL  N/A 11/20/2014   Procedure: COLONOSCOPY WITH PROPOFOL ;  Surgeon: Rogelia Copping, MD;  Location: [facility];  Service: Endoscopy;  Laterality: N/A;  . COLONOSCOPY WITH PROPOFOL  N/A 06/07/2022   Procedure: COLONOSCOPY WITH PROPOFOL ;  Surgeon: Copping Rogelia, MD;  Location: [facility];  Service: Endoscopy;  Laterality: N/A;  . ESOPHAGOGASTRODUODENOSCOPY (EGD) WITH PROPOFOL  N/A 11/20/2014   Procedure: ESOPHAGOGASTRODUODENOSCOPY (EGD) WITH PROPOFOL ;  Surgeon: Rogelia Copping, MD;  Location: [facility];  Service: Endoscopy;  Laterality: N/A;  . POLYPECTOMY  11/20/2014   Procedure: POLYPECTOMY;  Surgeon: Rogelia Copping, MD;  Location: [facility];  Service: Endoscopy;;  . SKIN CANCER EXCISION Left    finger  . TUBAL LIGATION  1970   Social History   Socioeconomic History  . Marital status: Married    Spouse name: Theatre manager  . Number of children: Not on file  . Years of education: Not on file  . Highest education level: Not on file  Occupational History  . Occupation: Retired  . Occupation: retired  Tobacco Use  . Smoking status: Former    Current packs/day: 0.00    Average packs/day: 1 pack/day for 30.0 years (30.0 ttl pk-yrs)    Types: Cigarettes    Start date: 01/20/1984    Quit date: 01/19/2014    Years since quitting: 10.0  . Smokeless tobacco: Former  Advertising account planner  . Vaping status: Never Used  Substance and Sexual Activity  . Alcohol use: No    Alcohol/week: 0.0 standard drinks of alcohol  . Drug use: No  . Sexual activity: Not Currently  Other Topics Concern  . Not on  file  Social History Narrative  . Not on file   Social Drivers of Health   Financial Resource Strain: Low Risk  (06/24/2023)   Overall Financial Resource Strain (CARDIA)   . Difficulty of Paying Living Expenses: Not hard at all  Food Insecurity: No Food Insecurity (11/10/2023)   Hunger Vital Sign   . Worried About Programme researcher, broadcasting/film/video in the Last Year: Never true   . Ran Out of Food in the Last Year: Never true  Transportation Needs: No Transportation Needs (11/10/2023)   PRAPARE - Transportation   . Lack of Transportation (Medical): No   . Lack of Transportation (Non-Medical):  No  Physical Activity: Insufficiently Active (06/24/2023)   Exercise Vital Sign   . Days of Exercise per Week: 3 days   . Minutes of Exercise per Session: 20 min  Stress: No Stress Concern Present (06/24/2023)   [employer] of Occupational Health - Occupational Stress Questionnaire   . Feeling of Stress : Only a little  Social Connections: Moderately Integrated (11/10/2023)   Social Connection and Isolation Panel   . Frequency of Communication with Friends and Family: More than three times a week   . Frequency of Social Gatherings with Friends and Family: Once a week   . Attends Religious Services: More than 4 times per year   . Active Member of Clubs or Organizations: No   . Attends Banker Meetings: Never   . Marital Status: Married  Catering manager Violence: Not At Risk (11/10/2023)   Humiliation, Afraid, Rape, and Kick questionnaire   . Fear of Current or Ex-Partner: No   . Emotionally Abused: No   . Physically Abused: No   . Sexually Abused: No   Family History  Problem Relation Age of Onset  . Brain cancer Mother   . Thyroid  disease Mother   . Hypertension Mother   . Breast cancer Maternal Aunt 26  . Breast cancer Cousin 35  . Breast cancer Maternal Aunt    Current Outpatient Medications on File Prior to Visit  Medication Sig  . albuterol  (PROVENTIL ) (2.5 MG/3ML) 0.083%  nebulizer solution Take 3 mLs (2.5 mg total) by nebulization every 4 (four) hours as needed for wheezing or shortness of breath.  . albuterol  (VENTOLIN  HFA) 108 (90 Base) MCG/ACT inhaler Inhale 2 puffs into the lungs every 6 (six) hours as needed for wheezing or shortness of breath.  . amLODipine  (NORVASC ) 5 MG tablet TAKE 1 TABLET BY MOUTH DAILY  . azelastine  (ASTELIN ) 0.1 % nasal spray Place 2 sprays into both nostrils 2 (two) times daily. Use in each nostril as directed  . baclofen  (LIORESAL ) 10 MG tablet Take 0.5-1 tablets (5-10 mg total) by mouth 3 (three) times daily as needed for muscle spasms.  . budesonide -formoterol  (SYMBICORT ) 160-4.5 MCG/ACT inhaler Inhale 2 puffs into the lungs 2 (two) times daily.  . Calcium  Carbonate-Vit D-Min (CALCIUM  1200 PO) Take by mouth daily.  . Ciclopirox  1 % shampoo 2-3 times per week lather on scalp, leave on 5-10 minutes, rinse out  . esomeprazole  (NEXIUM ) 40 MG capsule Take 1 capsule (40 mg total) by mouth daily.  . fluocinonide  (LIDEX ) 0.05 % external solution Apply 1-2 times daily to affected areas on scalp as needed for itching.  . furosemide  (LASIX ) 40 MG tablet TAKE 1 TABLET BY MOUTH DAILY AS NEEDED FOR FLUID  . TURMERIC PO Take by mouth daily.  . valACYclovir  (VALTREX ) 1000 MG tablet Take 2 tablets (2000mg ) twice a day for one day for fever blister flare. May repeat course for future flares.   No current facility-administered medications on file prior to visit.    Review of Systems  Constitutional:  Negative for activity change, appetite change, chills, diaphoresis, fatigue and fever.  HENT:  Negative for congestion and hearing loss.   Eyes:  Negative for visual disturbance.  Respiratory:  Negative for cough, chest tightness, shortness of breath and wheezing.   Cardiovascular:  Negative for chest pain, palpitations and leg swelling.  Gastrointestinal:  Negative for abdominal pain, constipation, diarrhea, nausea and vomiting.  Genitourinary:   Negative for dysuria, frequency and hematuria.  Musculoskeletal:  Negative for arthralgias  and neck pain.  Skin:  Negative for rash.  Neurological:  Negative for dizziness, weakness, light-headedness, numbness and headaches.  Hematological:  Negative for adenopathy.  Psychiatric/Behavioral:  Negative for behavioral problems, dysphoric mood and sleep disturbance.    Per HPI unless specifically indicated above     Objective:    BP 132/82 (BP Location: Left Arm, Patient Position: Sitting, Cuff Size: Normal)   Pulse 87   Ht 5' 4 (1.626 m)   Wt 211 lb 2 oz (95.8 kg)   SpO2 98%   BMI 36.24 kg/m   Wt Readings from Last 3 Encounters:  01/25/24 211 lb 2 oz (95.8 kg)  01/12/24 211 lb (95.7 kg)  11/16/23 211 lb (95.7 kg)    Physical Exam Vitals and nursing note reviewed.  Constitutional:      General: She is not in acute distress.    Appearance: She is well-developed. She is not diaphoretic.     Comments: Well-appearing, comfortable, cooperative  HENT:     Head: Normocephalic and atraumatic.  Eyes:     General:        Right eye: No discharge.        Left eye: No discharge.     Conjunctiva/sclera: Conjunctivae normal.     Pupils: Pupils are equal, round, and reactive to light.  Neck:     Thyroid : No thyromegaly.  Cardiovascular:     Rate and Rhythm: Normal rate and regular rhythm.     Pulses: Normal pulses.     Heart sounds: Normal heart sounds. No murmur heard. Pulmonary:     Effort: Pulmonary effort is normal. No respiratory distress.     Breath sounds: Normal breath sounds. No wheezing or rales.  Abdominal:     General: Bowel sounds are normal. There is no distension.     Palpations: Abdomen is soft. There is no mass.     Tenderness: There is no abdominal tenderness.  Musculoskeletal:        General: No tenderness. Normal range of motion.     Cervical back: Normal range of motion and neck supple.     Comments: Upper / Lower Extremities: - Normal muscle tone, strength  bilateral upper extremities 5/5, lower extremities 5/5  Lymphadenopathy:     Cervical: No cervical adenopathy.  Skin:    General: Skin is warm and dry.     Findings: Lesion (left foot dorsal very small < 1 cm firm nodular density mobile) present. No erythema or rash.  Neurological:     Mental Status: She is alert and oriented to person, place, and time.     Comments: Distal sensation intact to light touch all extremities  Psychiatric:        Mood and Affect: Mood normal.        Behavior: Behavior normal.        Thought Content: Thought content normal.     Comments: Well groomed, good eye contact, normal speech and thoughts     I have personally reviewed the radiology report from 11/21/23 on CT Coronary.  ADDENDUM REPORT: 11/26/2023 22:17   EXAM: OVER-READ INTERPRETATION  CT CHEST   The following report is an over-read performed by radiologist Dr. Fonda Mom [facility] on 11/26/2023. This over-read does not include interpretation of cardiac or coronary anatomy or pathology. The coronary CTA interpretation by the cardiologist is attached.   COMPARISON:  01/18/2014.   FINDINGS: Cardiovascular: See findings discussed in the body of the report. Ectatic ascending thoracic aorta 3.7  cm.   Mediastinum/Nodes: No suspicious adenopathy identified. Imaged mediastinal structures are unremarkable.   Lungs/Pleura: Imaged lungs are clear. No pleural effusion or pneumothorax.   Upper Abdomen: Small hiatal hernia.   Musculoskeletal: No chest wall abnormality. No acute osseous findings.   IMPRESSION: Small hiatal hernia.     Electronically Signed   By: Fonda Field M.D.   On: 11/26/2023 22:17    Addended by Field Fonda BROCKS, MD on 11/26/2023 10:20 PM    Study Result  Narrative & Impression  CLINICAL DATA:  9F with hyperlipidemia and chest pain.   EXAM: Cardiac/Coronary  CT   TECHNIQUE: The patient was scanned on a GE Apex scanner.    PROTOCOL: A non-contrast, gated CT scan was obtained with axial slices of 2.5 mm through the heart for calcium  scoring. Calcium  scoring was performed using the Agatston method. A 120 kV prospective, gated, contrast cardiac CT scan was obtained. Gantry rotation speed was 230 msec and collimation was 0.63 mm. Two sublingual nitroglycerin  tablets (0.8 mg) were given. The 3D data set was reconstructed with motion correction for the best systolic or diastolic phase. Images were analyzed on a dedicated workstation using MPR, MIP, and VRT modes. The patient received 95 cc of contrast.   FINDINGS: Aorta: Normal size. Ascending aorta 3.8 cm. No calcifications. No dissection.   Aortic Valve:  Trileaflet.  No calcifications.   Coronary Arteries:  Normal coronary origin.  Right dominance.   RCA is a large dominant artery that gives rise to PDA and PLVB. There is no plaque.   Left main is a large artery that gives rise to LAD and LCX arteries.   LAD is a large vessel that has minimal (<25%) plaque in the mid LAD. D1 has no plaque.   LCX is a non-dominant artery that gives rise to one large OM1 branch. There is no plaque.   Coronary Calcium  Score:   Left main: 0   Left anterior descending artery: 0.509   Left circumflex artery: 0   Right coronary artery: 0   Total: 0.509   Percentile: 18th   Other findings:   Normal pulmonary vein drainage into the left atrium.   Normal let atrial appendage without a thrombus.   Normal size of the pulmonary artery.   Non-cardiac: See separate report from [facility].   IMPRESSION: 1. Coronary calcium  score of 0.509. This was 18th percentile for age-, sex, and race-matched controls.   2. Total plaque volume 0 mm3 which is 0 percentile for age- and sex-matched controls (calcified plaque 0 mm3; non-calcified plaque 0 mm3).   3. Normal coronary origin with right dominance.   4.  There is minimal (<25%) plaque in the LAD.   CAD-RADS 1.   RECOMMENDATIONS: CAD-RADS 1: Minimal non-obstructive CAD (0-24%). Consider non-atherosclerotic causes of chest pain. Consider preventive therapy and risk factor modification.   Annabella Scarce, MD   Electronically Signed: By: Annabella Scarce M.D. On: 11/22/2023 13:37     Results for orders placed or performed in visit on 01/17/24  TSH   Collection Time: 01/18/24  8:04 AM  Result Value Ref Range   TSH 0.87 0.40 - 4.50 mIU/L  Lipid panel   Collection Time: 01/18/24  8:04 AM  Result Value Ref Range   Cholesterol 168 <200 mg/dL   HDL 65 > OR = 50 mg/dL   Triglycerides 64 <849 mg/dL   LDL Cholesterol (Calc) 88 mg/dL (calc)   Total CHOL/HDL Ratio 2.6 <5.0 (calc)   Non-HDL  Cholesterol (Calc) 103 <130 mg/dL (calc)  Hemoglobin J8r   Collection Time: 01/18/24  8:04 AM  Result Value Ref Range   Hgb A1c MFr Bld 5.6 <5.7 %   Mean Plasma Glucose 114 mg/dL   eAG (mmol/L) 6.3 mmol/L  CBC with Differential/Platelet   Collection Time: 01/18/24  8:04 AM  Result Value Ref Range   WBC 7.4 3.8 - 10.8 Thousand/uL   RBC 4.15 3.80 - 5.10 Million/uL   Hemoglobin 11.2 (L) 11.7 - 15.5 g/dL   HCT 63.8 64.9 - 54.9 %   MCV 87.0 80.0 - 100.0 fL   MCH 27.0 27.0 - 33.0 pg   MCHC 31.0 (L) 32.0 - 36.0 g/dL   RDW 86.2 88.9 - 84.9 %   Platelets 346 140 - 400 Thousand/uL   MPV 10.7 7.5 - 12.5 fL   Neutro Abs 4,529 1,500 - 7,800 cells/uL   Absolute Lymphocytes 2,361 850 - 3,900 cells/uL   Absolute Monocytes 400 200 - 950 cells/uL   Eosinophils Absolute 81 15 - 500 cells/uL   Basophils Absolute 30 0 - 200 cells/uL   Neutrophils Relative % 61.2 %   Total Lymphocyte 31.9 %   Monocytes Relative 5.4 %   Eosinophils Relative 1.1 %   Basophils Relative 0.4 %  Comprehensive metabolic panel with GFR   Collection Time: 01/18/24  8:04 AM  Result Value Ref Range   Glucose, Bld 95 65 - 99 mg/dL   BUN 21 7 - 25 mg/dL   Creat 8.94 (H) 9.39 - 1.00 mg/dL   eGFR 54 (L) > OR = 60 mL/min/1.71m2    BUN/Creatinine Ratio 20 6 - 22 (calc)   Sodium 140 135 - 146 mmol/L   Potassium 4.0 3.5 - 5.3 mmol/L   Chloride 104 98 - 110 mmol/L   CO2 24 20 - 32 mmol/L   Calcium  8.8 8.6 - 10.4 mg/dL   Total Protein 6.5 6.1 - 8.1 g/dL   Albumin 4.2 3.6 - 5.1 g/dL   Globulin 2.3 1.9 - 3.7 g/dL (calc)   AG Ratio 1.8 1.0 - 2.5 (calc)   Total Bilirubin 0.5 0.2 - 1.2 mg/dL   Alkaline phosphatase (APISO) 63 37 - 153 U/L   AST 22 10 - 35 U/L   ALT 16 6 - 29 U/L  VITAMIN D  25 Hydroxy (Vit-D Deficiency, Fractures)   Collection Time: 01/18/24  8:04 AM  Result Value Ref Range   Vit D, 25-Hydroxy 41 30 - 100 ng/mL      Assessment & Plan:   Problem List Items Addressed This Visit   None Visit Diagnoses       Annual physical exam    -  Primary        Updated Health Maintenance information Reviewed recent lab results with patient Encouraged improvement to lifestyle with diet and exercise Goal of weight loss   Centrilobular Emphysema Followed by Pulmonology, improved now. Already completed therapy, now resting has improved - Continue current inhaler regimen.  Chronic kidney disease stage 3 Chronic kidney disease stage 3 with stable kidney function. Creatinine 1.05, eGFR 54. - Continue monitoring kidney function through regular blood tests.  Osteoporosis Managed by Endocrinology Osteoporosis with osteopenia in the wrist. Managed with Reclast  infusion. - Administer Reclast  infusion next week. X 1 yearly  Anemia Mild anemia with hemoglobin at 11.2. Managed with daily iron supplementation. - Continue daily iron supplementation. - Monitor hemoglobin levels regularly.  Morbid Obesity BMI >36 Complication with hyperlipidemia, osteoporosis Obesity at 210 lbs. Engaged  in Weight Watchers. Discussed potential future medication options, not covered by insurance. - Continue Weight Watchers program. - Monitor weight and consider future medication options if insurance coverage  changes.  Hyperlipidemia Hyperlipidemia managed without statins. Total cholesterol in 160s, LDL 88. Good control achieved naturally. - Continue monitoring lipid levels. - Avoid statin use unless necessary.  Subcutaneous nodule, foot Subcutaneous nodule on foot, likely benign. Not causing significant symptoms. Surgical intervention not recommended.  General Health Maintenance Reviewed immunizations and screenings. Declined flu shot. Mammogram and ultrasound completed in July. Shingles and COVID boosters available. Bone density next year. - Consider shingles and COVID boosters at pharmacy. - Repeat mammogram and ultrasound in one year. - Schedule bone density test next year.        No orders of the defined types were placed in this encounter.   No orders of the defined types were placed in this encounter.    Follow up plan: Return in about 6 months (around 07/25/2024) for 6 month PreDM A1c.  Marsa Officer, DO [facility] 01/25/2024, 2:42 PM

## 2024-01-25 NOTE — Patient Instructions (Addendum)
 Thank you for coming to the office today.  Keep up the good work on [employer] Continue current meds  Remain off of Statin for now.  Please schedule a Follow-up Appointment to: Return in about 6 months (around 07/25/2024) for 6 month PreDM A1c.  If you have any other questions or concerns, please feel free to call the office or send a message through MyChart. You may also schedule an earlier appointment if necessary.  Additionally, you may be receiving a survey about your experience at our office within a few days to 1 week by e-mail or mail. We value your feedback.  Marsa Officer, DO [facility], [place]

## 2024-01-25 NOTE — Progress Notes (Unsigned)
 Subjective:    Patient ID: Kelly Weber, female    DOB: [birth year], 78 y.o.   MRN: [id number]  Kelly Weber is a 78 y.o. female presenting on 01/25/2024 for Annual Exam   HPI  Discussed the use of AI scribe software for clinical note transcription with the patient, who gave verbal consent to proceed.  History of Present Illness   Pre-Diabetes A1c down to 5.6, improved from 5.8  HFU History of Chest Tightness and arm numbness Went to hospital ED by EMS after had low HR 30s and near syncope, chest pain Cardiac Morphology  Recurrent Bronchitis Followed by Pulmonology [place] Completed course of treatment Now with time and rest she has improved ***  Hyperlipidemia Off Rosuvastatin , intolerance   CKD-III Creatinine improved to 1.05, from 1.25, eGFR 54  Morbid Obesity BMI >36 Weight down from 220-225 down to 211 lbs On Weight Watchers program  Bronchitis / COPD / Dyspnea Followed by Dr Tamea  Left Foot Pain / Swelling Followed by Dr Ashley [facility] L foot   Osteoporosis Followed by Dr Cherilyn for Endocrinology Last visit yesterday 01/24/24, osteopenia in hip and osteoporosis in wrist On Reclast  infusion yearly Next DEXA 2026   Centrilobular Emphysema (COPD) Improved on therapy.   Right Hip Pain, limited her mobility   Aortic Atherosclerosis On imaging   Health Maintenance: Mammogram done July 2025, birads 0, had US  and diagnostic then repeat 1 year     06/24/2023    3:25 PM 04/26/2023    2:09 PM 01/12/2023    3:32 PM  Depression screen PHQ 2/9  Decreased Interest 0 2 0  Down, Depressed, Hopeless 0 2 0  PHQ - 2 Score 0 4 0  Altered sleeping 0 0 0  Tired, decreased energy 0 1 1  Change in appetite 0 0 1  Feeling bad or failure about yourself  0 0 0  Trouble concentrating 0 0 0  Moving slowly or fidgety/restless 0 1 0  Suicidal thoughts 0 0 0  PHQ-9 Score 0 6 2  Difficult doing work/chores   Not difficult at all        04/26/2023    2:09 PM 01/12/2023    3:32 PM 12/03/2022    1:59 PM 06/11/2022    3:40 PM  GAD 7 : Generalized Anxiety Score  Nervous, Anxious, on Edge 0 1 1 1   Control/stop worrying 1 0 1 2  Worry too much - different things 1 1 2 2   Trouble relaxing 1 1 1 1   Restless 0 1 1 0  Easily annoyed or irritable 1 0 1 1  Afraid - awful might happen 0 0 1 1  Total GAD 7 Score 4 4 8 8   Anxiety Difficulty   Somewhat difficult      Past Medical History:  Diagnosis Date   Allergies    Anxiety    Arthritis    fingers, left knee   COPD (chronic obstructive pulmonary disease) (HCC)    Depression    Diverticulitis    Dyspnea    Fatigue    GERD (gastroesophageal reflux disease)    HLD (hyperlipidemia)    Insomnia 2006   Lower back pain    Skin cancer 2007   Basal Cell CA resected from Left middle finger   Past Surgical History:  Procedure Laterality Date   BREAST CYST ASPIRATION Bilateral    30 years ago   CATARACT EXTRACTION W/ INTRAOCULAR LENS IMPLANT Bilateral 2023   [facility]  COLONOSCOPY WITH PROPOFOL  N/A 11/20/2014   Procedure: COLONOSCOPY WITH PROPOFOL ;  Surgeon: Rogelia Copping, MD;  Location: [facility];  Service: Endoscopy;  Laterality: N/A;   COLONOSCOPY WITH PROPOFOL  N/A 06/07/2022   Procedure: COLONOSCOPY WITH PROPOFOL ;  Surgeon: Copping Rogelia, MD;  Location[facility];  Service: Endoscopy;  Laterality: N/A;   ESOPHAGOGASTRODUODENOSCOPY (EGD) WITH PROPOFOL  N/A 11/20/2014   Procedure: ESOPHAGOGASTRODUODENOSCOPY (EGD) WITH PROPOFOL ;  Surgeon: Rogelia Copping, MD;  Location: [facility];  Service: Endoscopy;  Laterality: N/A;   POLYPECTOMY  11/20/2014   Procedure: POLYPECTOMY;  Surgeon: Rogelia Copping, MD;  Location: [facility];  Service: Endoscopy;;   SKIN CANCER EXCISION Left    finger   TUBAL LIGATION  1970   Social History   Socioeconomic History   Marital status: Married    Spouse name: Ajanay Farve   Number of children: Not on file   Years  of education: Not on file   Highest education level: Not on file  Occupational History   Occupation: Retired   Occupation: retired  Tobacco Use   Smoking status: Former    Current packs/day: 0.00    Average packs/day: 1 pack/day for 30.0 years (30.0 ttl pk-yrs)    Types: Cigarettes    Start date: 01/20/1984    Quit date: 01/19/2014    Years since quitting: 10.0   Smokeless tobacco: Former  Building services engineer status: Never Used  Substance and Sexual Activity   Alcohol use: No    Alcohol/week: 0.0 standard drinks of alcohol   Drug use: No   Sexual activity: Not Currently  Other Topics Concern   Not on file  Social History Narrative   Not on file   Social Drivers of Health   Financial Resource Strain: Low Risk  (06/24/2023)   Overall Financial Resource Strain (CARDIA)    Difficulty of Paying Living Expenses: Not hard at all  Food Insecurity: No Food Insecurity (11/10/2023)   Hunger Vital Sign    Worried About Running Out of Food in the Last Year: Never true    Ran Out of Food in the Last Year: Never true  Transportation Needs: No Transportation Needs (11/10/2023)   PRAPARE - Administrator, Civil Service (Medical): No    Lack of Transportation (Non-Medical): No  Physical Activity: Insufficiently Active (06/24/2023)   Exercise Vital Sign    Days of Exercise per Week: 3 days    Minutes of Exercise per Session: 20 min  Stress: No Stress Concern Present (06/24/2023)   [employer] of Occupational Health - Occupational Stress Questionnaire    Feeling of Stress : Only a little  Social Connections: Moderately Integrated (11/10/2023)   Social Connection and Isolation Panel    Frequency of Communication with Friends and Family: More than three times a week    Frequency of Social Gatherings with Friends and Family: Once a week    Attends Religious Services: More than 4 times per year    Active Member of [employer] or Organizations: No    Attends Banker  Meetings: Never    Marital Status: Married  Catering manager Violence: Not At Risk (11/10/2023)   Humiliation, Afraid, Rape, and Kick questionnaire    Fear of Current or Ex-Partner: No    Emotionally Abused: No    Physically Abused: No    Sexually Abused: No   Family History  Problem Relation Age of Onset   Brain cancer Mother    Thyroid  disease Mother  Hypertension Mother    Breast cancer Maternal Aunt 26   Breast cancer Cousin 35   Breast cancer Maternal Aunt    Current Outpatient Medications on File Prior to Visit  Medication Sig   albuterol  (PROVENTIL ) (2.5 MG/3ML) 0.083% nebulizer solution Take 3 mLs (2.5 mg total) by nebulization every 4 (four) hours as needed for wheezing or shortness of breath.   albuterol  (VENTOLIN  HFA) 108 (90 Base) MCG/ACT inhaler Inhale 2 puffs into the lungs every 6 (six) hours as needed for wheezing or shortness of breath.   amLODipine  (NORVASC ) 5 MG tablet TAKE 1 TABLET BY MOUTH DAILY   azelastine  (ASTELIN ) 0.1 % nasal spray Place 2 sprays into both nostrils 2 (two) times daily. Use in each nostril as directed   baclofen  (LIORESAL ) 10 MG tablet Take 0.5-1 tablets (5-10 mg total) by mouth 3 (three) times daily as needed for muscle spasms.   budesonide -formoterol  (SYMBICORT ) 160-4.5 MCG/ACT inhaler Inhale 2 puffs into the lungs 2 (two) times daily.   Calcium  Carbonate-Vit D-Min (CALCIUM  1200 PO) Take by mouth daily.   Ciclopirox  1 % shampoo 2-3 times per week lather on scalp, leave on 5-10 minutes, rinse out   esomeprazole  (NEXIUM ) 40 MG capsule Take 1 capsule (40 mg total) by mouth daily.   fluocinonide  (LIDEX ) 0.05 % external solution Apply 1-2 times daily to affected areas on scalp as needed for itching.   furosemide  (LASIX ) 40 MG tablet TAKE 1 TABLET BY MOUTH DAILY AS NEEDED FOR FLUID   TURMERIC PO Take by mouth daily.   valACYclovir  (VALTREX ) 1000 MG tablet Take 2 tablets (2000mg ) twice a day for one day for fever blister flare. May repeat course  for future flares.   metoprolol  tartrate (LOPRESSOR ) 100 MG tablet Take tablet (100mg ) TWO hours prior to your cardiac CT scan. (Patient not taking: Reported on 01/25/2024)   No current facility-administered medications on file prior to visit.    Review of Systems Per HPI unless specifically indicated above     Objective:    BP 132/82 (BP Location: Left Arm, Patient Position: Sitting, Cuff Size: Normal)   Pulse 87   Ht 5' 4 (1.626 m)   Wt 211 lb 2 oz (95.8 kg)   SpO2 98%   BMI 36.24 kg/m   Wt Readings from Last 3 Encounters:  01/25/24 211 lb 2 oz (95.8 kg)  01/12/24 211 lb (95.7 kg)  11/16/23 211 lb (95.7 kg)    Physical Exam  I have personally reviewed the radiology report from *** on ***.  ADDENDUM REPORT: 11/26/2023 22:17   EXAM: OVER-READ INTERPRETATION  CT CHEST   The following report is an over-read performed by radiologist Dr. Fonda [facility] on 11/26/2023. This over-read does not include interpretation of cardiac or coronary anatomy or pathology. The coronary CTA interpretation by the cardiologist is attached.   COMPARISON:  01/18/2014.   FINDINGS: Cardiovascular: See findings discussed in the body of the report. Ectatic ascending thoracic aorta 3.7 cm.   Mediastinum/Nodes: No suspicious adenopathy identified. Imaged mediastinal structures are unremarkable.   Lungs/Pleura: Imaged lungs are clear. No pleural effusion or pneumothorax.   Upper Abdomen: Small hiatal hernia.   Musculoskeletal: No chest wall abnormality. No acute osseous findings.   IMPRESSION: Small hiatal hernia.     Electronically Signed   By: Fonda Field M.D.   On: 11/26/2023 22:17    Addended by Field Fonda BROCKS, MD on 11/26/2023 10:20 PM    Study Result  Narrative & Impression  CLINICAL DATA:  75F with hyperlipidemia and chest pain.   EXAM: Cardiac/Coronary  CT   TECHNIQUE: The patient was scanned on a GE Apex scanner.   PROTOCOL: A  non-contrast, gated CT scan was obtained with axial slices of 2.5 mm through the heart for calcium  scoring. Calcium  scoring was performed using the Agatston method. A 120 kV prospective, gated, contrast cardiac CT scan was obtained. Gantry rotation speed was 230 msec and collimation was 0.63 mm. Two sublingual nitroglycerin  tablets (0.8 mg) were given. The 3D data set was reconstructed with motion correction for the best systolic or diastolic phase. Images were analyzed on a dedicated workstation using MPR, MIP, and VRT modes. The patient received 95 cc of contrast.   FINDINGS: Aorta: Normal size. Ascending aorta 3.8 cm. No calcifications. No dissection.   Aortic Valve:  Trileaflet.  No calcifications.   Coronary Arteries:  Normal coronary origin.  Right dominance.   RCA is a large dominant artery that gives rise to PDA and PLVB. There is no plaque.   Left main is a large artery that gives rise to LAD and LCX arteries.   LAD is a large vessel that has minimal (<25%) plaque in the mid LAD. D1 has no plaque.   LCX is a non-dominant artery that gives rise to one large OM1 branch. There is no plaque.   Coronary Calcium  Score:   Left main: 0   Left anterior descending artery: 0.509   Left circumflex artery: 0   Right coronary artery: 0   Total: 0.509   Percentile: 18th   Other findings:   Normal pulmonary vein drainage into the left atrium.   Normal let atrial appendage without a thrombus.   Normal size of the pulmonary artery.   Non-cardiac: See separate report from [facility].   IMPRESSION: 1. Coronary calcium  score of 0.509. This was 18th percentile for age-, sex, and race-matched controls.   2. Total plaque volume 0 mm3 which is 0 percentile for age- and sex-matched controls (calcified plaque 0 mm3; non-calcified plaque 0 mm3).   3. Normal coronary origin with right dominance.   4.  There is minimal (<25%) plaque in the LAD.  CAD-RADS 1.    RECOMMENDATIONS: CAD-RADS 1: Minimal non-obstructive CAD (0-24%). Consider non-atherosclerotic causes of chest pain. Consider preventive therapy and risk factor modification.   Annabella Scarce, MD   Electronically Signed: By: Annabella Scarce M.D. On: 11/22/2023 13:37     Results for orders placed or performed in visit on 01/17/24  TSH   Collection Time: 01/18/24  8:04 AM  Result Value Ref Range   TSH 0.87 0.40 - 4.50 mIU/L  Lipid panel   Collection Time: 01/18/24  8:04 AM  Result Value Ref Range   Cholesterol 168 <200 mg/dL   HDL 65 > OR = 50 mg/dL   Triglycerides 64 <849 mg/dL   LDL Cholesterol (Calc) 88 mg/dL (calc)   Total CHOL/HDL Ratio 2.6 <5.0 (calc)   Non-HDL Cholesterol (Calc) 103 <130 mg/dL (calc)  Hemoglobin J8r   Collection Time: 01/18/24  8:04 AM  Result Value Ref Range   Hgb A1c MFr Bld 5.6 <5.7 %   Mean Plasma Glucose 114 mg/dL   eAG (mmol/L) 6.3 mmol/L  CBC with Differential/Platelet   Collection Time: 01/18/24  8:04 AM  Result Value Ref Range   WBC 7.4 3.8 - 10.8 Thousand/uL   RBC 4.15 3.80 - 5.10 Million/uL   Hemoglobin 11.2 (L) 11.7 - 15.5 g/dL   HCT 36.1  35.0 - 45.0 %   MCV 87.0 80.0 - 100.0 fL   MCH 27.0 27.0 - 33.0 pg   MCHC 31.0 (L) 32.0 - 36.0 g/dL   RDW 86.2 88.9 - 84.9 %   Platelets 346 140 - 400 Thousand/uL   MPV 10.7 7.5 - 12.5 fL   Neutro Abs 4,529 1,500 - 7,800 cells/uL   Absolute Lymphocytes 2,361 850 - 3,900 cells/uL   Absolute Monocytes 400 200 - 950 cells/uL   Eosinophils Absolute 81 15 - 500 cells/uL   Basophils Absolute 30 0 - 200 cells/uL   Neutrophils Relative % 61.2 %   Total Lymphocyte 31.9 %   Monocytes Relative 5.4 %   Eosinophils Relative 1.1 %   Basophils Relative 0.4 %  Comprehensive metabolic panel with GFR   Collection Time: 01/18/24  8:04 AM  Result Value Ref Range   Glucose, Bld 95 65 - 99 mg/dL   BUN 21 7 - 25 mg/dL   Creat 8.94 (H) 9.39 - 1.00 mg/dL   eGFR 54 (L) > OR = 60 mL/min/1.72m2    BUN/Creatinine Ratio 20 6 - 22 (calc)   Sodium 140 135 - 146 mmol/L   Potassium 4.0 3.5 - 5.3 mmol/L   Chloride 104 98 - 110 mmol/L   CO2 24 20 - 32 mmol/L   Calcium  8.8 8.6 - 10.4 mg/dL   Total Protein 6.5 6.1 - 8.1 g/dL   Albumin 4.2 3.6 - 5.1 g/dL   Globulin 2.3 1.9 - 3.7 g/dL (calc)   AG Ratio 1.8 1.0 - 2.5 (calc)   Total Bilirubin 0.5 0.2 - 1.2 mg/dL   Alkaline phosphatase (APISO) 63 37 - 153 U/L   AST 22 10 - 35 U/L   ALT 16 6 - 29 U/L  VITAMIN D  25 Hydroxy (Vit-D Deficiency, Fractures)   Collection Time: 01/18/24  8:04 AM  Result Value Ref Range   Vit D, 25-Hydroxy 41 30 - 100 ng/mL      Assessment & Plan:   Problem List Items Addressed This Visit   None    Updated Health Maintenance information Reviewed recent lab results with patient Encouraged improvement to lifestyle with diet and exercise Goal of weight loss  Assessment and Plan Assessment & Plan      No orders of the defined types were placed in this encounter.   No orders of the defined types were placed in this encounter.    Follow up plan: No follow-ups on file.  Marsa Officer, DO [facility] 01/25/2024, 2:42 PM

## 2024-01-26 DIAGNOSIS — N1831 Chronic kidney disease, stage 3a: Secondary | ICD-10-CM | POA: Insufficient documentation

## 2024-01-31 ENCOUNTER — Other Ambulatory Visit: Payer: Self-pay | Admitting: Family Medicine

## 2024-01-31 DIAGNOSIS — Z1231 Encounter for screening mammogram for malignant neoplasm of breast: Secondary | ICD-10-CM

## 2024-02-08 ENCOUNTER — Encounter

## 2024-02-08 ENCOUNTER — Ambulatory Visit: Admitting: General Practice

## 2024-02-08 DIAGNOSIS — M810 Age-related osteoporosis without current pathological fracture: Secondary | ICD-10-CM | POA: Diagnosis not present

## 2024-02-09 ENCOUNTER — Ambulatory Visit: Admitting: Internal Medicine

## 2024-02-10 ENCOUNTER — Other Ambulatory Visit: Payer: Self-pay | Admitting: Pulmonary Disease

## 2024-02-22 ENCOUNTER — Encounter: Payer: Self-pay | Admitting: Cardiology

## 2024-02-22 ENCOUNTER — Ambulatory Visit: Attending: Cardiology | Admitting: Cardiology

## 2024-02-22 VITALS — BP 118/74 | HR 83 | Ht 64.0 in | Wt 207.6 lb

## 2024-02-22 DIAGNOSIS — I10 Essential (primary) hypertension: Secondary | ICD-10-CM

## 2024-02-22 DIAGNOSIS — I2510 Atherosclerotic heart disease of native coronary artery without angina pectoris: Secondary | ICD-10-CM | POA: Diagnosis not present

## 2024-02-22 DIAGNOSIS — Z09 Encounter for follow-up examination after completed treatment for conditions other than malignant neoplasm: Secondary | ICD-10-CM | POA: Diagnosis not present

## 2024-02-22 DIAGNOSIS — R609 Edema, unspecified: Secondary | ICD-10-CM

## 2024-02-22 DIAGNOSIS — R079 Chest pain, unspecified: Secondary | ICD-10-CM | POA: Diagnosis not present

## 2024-02-22 NOTE — Patient Instructions (Signed)
 Medication Instructions:  Your physician recommends that you continue on your current medications as directed. Please refer to the Current Medication list given to you today.  *If you need a refill on your cardiac medications before your next appointment, please call your pharmacy*  Lab Work: None  If you have labs (blood work) drawn today and your tests are completely normal, you will receive your results only by: MyChart Message (if you have MyChart) OR A paper copy in the mail If you have any lab test that is abnormal or we need to change your treatment, we will call you to review the results.  Testing/Procedures: None   Follow-Up: At [facility], you and your health needs are our priority.  As part of our continuing mission to provide you with exceptional heart care, our providers are all part of one team.  This team includes your primary Cardiologist (physician) and Advanced Practice Providers or APPs (Physician Assistants and Nurse Practitioners) who all work together to provide you with the care you need, when you need it.  Your next appointment:    As needed  Provider:   Delon Hoover, NP   We recommend signing up for the patient portal called MyChart.  Sign up information is provided on this After Visit Summary.  MyChart is used to connect with patients for Virtual Visits (Telemedicine).  Patients are able to view lab/test results, encounter notes, upcoming appointments, etc.  Non-urgent messages can be sent to your provider as well.   To learn more about what you can do with MyChart, go to [email or web address].   Other Instructions None

## 2024-02-22 NOTE — Progress Notes (Signed)
 Cardiology Office Note   Date:  02/22/2024  ID:  Kelly Weber, DOB 1945-12-23, MRN [id number] PCP: Edman Marsa PARAS, DO  [facility] Providers Cardiologist:  Vina Gull, MD     History of Present Illness Kelly Weber is a 78 y.o. female with a past medical history of mild nonobstructive CAD, COPD, hypertension, dyslipidemia, bradycardia  11/21/2023 coronary CT 0.509, placing her in the 18th percentile, minimal plaque in the distal LAD. 11/10/2023 echo EF 55 to 60%, grade 1 DD, trivial MR, trivial TR 2017 echo EF 45 to 50%  She was initially evaluated by Dr. Gollan in 2017 for the evaluation of shortness of breath, dizziness and weakness.  An echocardiogram was arranged at that time revealing an EF 45 to 50%.  She was admitted to [facility] in July 2025, she was at a dermatologist appointment with her husband, began having chest pain palpitations, felt like she was going to pass out, EMS arrived and she was hypertensive however had a bradycardic event during transport.  Troponins were negative x 2, she underwent echocardiogram which was largely normal, no events noted while she was on telemetry, coronary CT was arranged outpatient and completed on 11/26/2023 revealing a calcium  score of 0.509, placing her in the 18th percentile, minimal plaque in the distal LAD.  Kelly Weber presents today for hospital follow-up in July.  She has been doing well since this time, no further episodes of chest pain, near syncope, dizziness.  She is joined [employer] and is actively working on weight loss, down approximately 12 pounds.  We reviewed her largely normal echocardiogram as well as her mild nonobstructive coronary artery disease.  She follows closely with her PCP. She denies chest pain, palpitations, dyspnea, pnd, orthopnea, n, v, dizziness, syncope, edema, weight gain, or early satiety.    ROS: Review of Systems  All other systems reviewed and are negative.    Studies  Reviewed EKG Interpretation Date/Time:  Wednesday February 22 2024 11:46:36 EST Ventricular Rate:  83 PR Interval:  156 QRS Duration:  90 QT Interval:  398 QTC Calculation: 467 R Axis:   64  Text Interpretation: Normal sinus rhythm -- no changes from prior ekg Nonspecific ST abnormality When compared with ECG of 09-Nov-2023 12:17, PREVIOUS ECG IS PRESENT Confirmed by Carlin Nest [phone]) on 02/22/2024 11:49:32 AM     Risk Assessment/Calculations           Physical Exam VS:  BP 118/74   Pulse 83   Ht 5' 4 (1.626 m)   Wt 207 lb 9.6 oz (94.2 kg)   SpO2 95%   BMI 35.63 kg/m        Wt Readings from Last 3 Encounters:  02/22/24 207 lb 9.6 oz (94.2 kg)  01/25/24 211 lb 2 oz (95.8 kg)  01/12/24 211 lb (95.7 kg)    GEN: Appears younger than stated age NECK: No JVD; No carotid bruits CARDIAC: RRR, no murmurs, rubs, gallops RESPIRATORY:  Clear to auscultation without rales, wheezing or rhonchi  ABDOMEN: Soft, non-tender, non-distended EXTREMITIES:  No edema; No deformity   ASSESSMENT AND PLAN Mild nonobstructive CAD-calcium  score 0.509, 18th percentile, Stable with no anginal symptoms. No indication for ischemic evaluation.  No indication for aspirin therapy at this time.  Hypertension-blood pressure is well-controlled at 118/74, previously on amlodipine  however she is no longer taking this.  Pedal edema-she is on Lasix  40 mg as needed for pedal edema.  No edema appreciated today.  Dispo: Follow up on as needed basis.   Signed, Delon JAYSON Hoover, NP

## 2024-02-28 ENCOUNTER — Encounter: Payer: Self-pay | Admitting: Pulmonary Disease

## 2024-02-28 ENCOUNTER — Ambulatory Visit: Admitting: Pulmonary Disease

## 2024-02-28 ENCOUNTER — Other Ambulatory Visit: Payer: Self-pay

## 2024-02-28 ENCOUNTER — Ambulatory Visit (INDEPENDENT_AMBULATORY_CARE_PROVIDER_SITE_OTHER)

## 2024-02-28 VITALS — BP 104/64 | HR 83 | Temp 98.1°F | Ht 64.0 in | Wt 205.8 lb

## 2024-02-28 DIAGNOSIS — K219 Gastro-esophageal reflux disease without esophagitis: Secondary | ICD-10-CM | POA: Diagnosis not present

## 2024-02-28 DIAGNOSIS — R0602 Shortness of breath: Secondary | ICD-10-CM | POA: Diagnosis not present

## 2024-02-28 DIAGNOSIS — K449 Diaphragmatic hernia without obstruction or gangrene: Secondary | ICD-10-CM | POA: Diagnosis not present

## 2024-02-28 DIAGNOSIS — J449 Chronic obstructive pulmonary disease, unspecified: Secondary | ICD-10-CM

## 2024-02-28 LAB — PULMONARY FUNCTION TEST
DL/VA % pred: 79 %
DL/VA: 3.29 ml/min/mmHg/L
DLCO unc % pred: 71 %
DLCO unc: 13.38 ml/min/mmHg
FEF 25-75 Post: 0.87 L/s
FEF 25-75 Pre: 0.78 L/s
FEF2575-%Change-Post: 11 %
FEF2575-%Pred-Post: 57 %
FEF2575-%Pred-Pre: 51 %
FEV1-%Change-Post: 4 %
FEV1-%Pred-Post: 77 %
FEV1-%Pred-Pre: 74 %
FEV1-Post: 1.55 L
FEV1-Pre: 1.49 L
FEV1FVC-%Change-Post: 2 %
FEV1FVC-%Pred-Pre: 84 %
FEV6-%Change-Post: 0 %
FEV6-%Pred-Post: 92 %
FEV6-%Pred-Pre: 92 %
FEV6-Post: 2.35 L
FEV6-Pre: 2.36 L
FEV6FVC-%Change-Post: -1 %
FEV6FVC-%Pred-Post: 102 %
FEV6FVC-%Pred-Pre: 104 %
FVC-%Change-Post: 1 %
FVC-%Pred-Post: 89 %
FVC-%Pred-Pre: 88 %
FVC-Post: 2.42 L
FVC-Pre: 2.38 L
Post FEV1/FVC ratio: 64 %
Post FEV6/FVC ratio: 97 %
Pre FEV1/FVC ratio: 62 %
Pre FEV6/FVC Ratio: 99 %
RV % pred: 163 %
RV: 3.85 L
TLC % pred: 126 %
TLC: 6.42 L

## 2024-02-28 NOTE — Patient Instructions (Signed)
 VISIT SUMMARY:  Today, we discussed your COPD and overall health. You reported improvements in your symptoms, including better walking ability and reduced cough, which you attribute to your weight loss efforts. We also talked about your occasional throat clearing and voice changes.  YOUR PLAN:  -MILD STAGE 1 CHRONIC OBSTRUCTIVE PULMONARY DISEASE (COPD): COPD is a chronic lung condition that makes it hard to breathe. Your COPD is well-managed with your current treatment. Continue using Symbicort  as prescribed and use albuterol  as needed for shortness of breath. Remember to rinse your mouth after using Symbicort , and consider adding baking soda to the rinse water  to neutralize its effects. We will schedule a follow-up in 6 months unless your symptoms worsen.  -OVERWEIGHT: Being overweight can reduce lung capacity and make breathing more difficult. Your efforts with the Weight Watchers program have improved your mobility and lung function. Continue participating in the Weight Watchers program to manage your weight.  INSTRUCTIONS:  We will schedule a follow-up appointment in 6 months. If your symptoms worsen before then, please contact us .

## 2024-02-28 NOTE — Progress Notes (Signed)
 Full PFT completed today ? ?

## 2024-02-28 NOTE — Progress Notes (Signed)
 Subjective:    Patient ID: Kelly Weber, female    DOB: [birth year], 78 y.o.   MRN: [id number]  Patient Care Team: Edman Marsa PARAS, DO as PCP - General (Family Medicine) Okey Vina GAILS, MD as PCP - Cardiology (Cardiology)  Chief Complaint  Patient presents with   COPD    Occasional cough.     BACKGROUND/INTERVAL:Patient is a 78 year old former smoker, with a history as noted below, who presents for follow-up of shortness of breath on exertion.  She was initially seen here on 16 November 2023.  Last visit here was 12 January 2024 at that time she had an acute exacerbation of COPD.  She was treated with antibiotics and steroids.  She responded well.  HPI Discussed the use of AI scribe software for clinical note transcription with the patient, who gave verbal consent to proceed.  History of Present Illness   Kelly Weber is a 78 year old female with COPD who presents for follow-up.  She has COPD and continues to use Symbicort . Albuterol  is used sparingly, primarily during activities like walking uphill, and is not needed around the house.  She notes significant improvement in her symptoms, including better walking ability and reduced cough, which she attributes to weight loss efforts with [employer].  She experiences throat clearing and occasional voice changes, which she attributes to sinus issues or drainage. She quit smoking approximately 12 to 13 years ago.   She has gastroesophageal reflux but her symptoms are well-controlled with Nexium  and antireflux measures  She had PFTs today FEV1 was 1.49 L or 74% predicted, FVC was 2.38 L or 88% predicted, FEV1/FVC 62%, there is no bronchodilator response.  There is hyperinflation and air trapping noted on lung volumes.  Diffusion capacity is mildly reduced.  Consistent with mild COPD.  Patient was made aware of results.     Review of Systems A 10 point review of systems was performed and it is as noted above otherwise  negative.   Patient Active Problem List   Diagnosis Date Noted   Stage 3a chronic kidney disease (HCC) 01/26/2024   Chest pain of uncertain etiology 11/09/2023   History of colonic polyps 06/07/2022   Chronic bilateral low back pain without sciatica 06/13/2020   Osteoporosis of lumbar spine 01/30/2020   Atherosclerosis of aorta 10/24/2019   Vitamin D  deficiency 11/23/2018   Elevated hemoglobin A1c 01/29/2018   Hyperlipidemia 09/02/2016   GERD (gastroesophageal reflux disease) 09/01/2016   Chronic diarrhea 02/09/2016   Anemia 09/29/2015   Morbid obesity (HCC) 08/27/2015   Centrilobular emphysema (HCC) 08/27/2015   Syncope and collapse 08/26/2015   SOB (shortness of breath) on exertion 08/26/2015   Pityriasis rosea 03/12/2015   Seasonal allergies 02/27/2015   Hx of colonic polyps    Benign neoplasm of descending colon    Irritable bowel syndrome (IBS)    Hiatal hernia    Gonalgia 09/23/2014   Arthritis 09/23/2014   Major depression, recurrent, full remission 09/23/2014   Dysesthesia 09/23/2014   Insomnia due to medical condition 09/23/2014   Acne 09/23/2014   H/O malignant neoplasm of skin 09/23/2014   Compulsive tobacco user syndrome 09/23/2014   Hematuria, microscopic 09/23/2014   Seborrheic keratoses, inflamed 09/23/2014    Social History   Tobacco Use   Smoking status: Former    Current packs/day: 0.00    Average packs/day: 1 pack/day for 30.0 years (30.0 ttl pk-yrs)    Types: Cigarettes    Start date: 01/20/1984  Quit date: 01/19/2014    Years since quitting: 10.1   Smokeless tobacco: Former  Substance Use Topics   Alcohol use: No    Alcohol/week: 0.0 standard drinks of alcohol    Allergies  Allergen Reactions   Penicillins Hives and Swelling    Current Meds  Medication Sig   albuterol  (PROVENTIL ) (2.5 MG/3ML) 0.083% nebulizer solution Take 3 mLs (2.5 mg total) by nebulization every 4 (four) hours as needed for wheezing or shortness of breath.    albuterol  (VENTOLIN  HFA) 108 (90 Base) MCG/ACT inhaler Inhale 2 puffs into the lungs every 6 (six) hours as needed for wheezing or shortness of breath.   azelastine  (ASTELIN ) 0.1 % nasal spray Place 2 sprays into both nostrils 2 (two) times daily. Use in each nostril as directed   baclofen  (LIORESAL ) 10 MG tablet Take 0.5-1 tablets (5-10 mg total) by mouth 3 (three) times daily as needed for muscle spasms.   budesonide -formoterol  (SYMBICORT ) 160-4.5 MCG/ACT inhaler Inhale 2 puffs into the lungs 2 (two) times daily.   Calcium  Carbonate-Vit D-Min (CALCIUM  1200 PO) Take by mouth daily.   Ciclopirox  1 % shampoo 2-3 times per week lather on scalp, leave on 5-10 minutes, rinse out   esomeprazole  (NEXIUM ) 40 MG capsule TAKE 1 CAPSULE BY MOUTH DAILY   Ferrous Gluconate (IRON 27 PO) Take by mouth daily at 2 am.   fluocinonide  (LIDEX ) 0.05 % external solution Apply 1-2 times daily to affected areas on scalp as needed for itching.   furosemide  (LASIX ) 40 MG tablet TAKE 1 TABLET BY MOUTH DAILY AS NEEDED FOR FLUID   Nutritional Supplements (OSAPLEX MK-7 PO) Take by mouth daily.   TURMERIC PO Take by mouth daily.   valACYclovir  (VALTREX ) 1000 MG tablet Take 2 tablets (2000mg ) twice a day for one day for fever blister flare. May repeat course for future flares.    Immunization History  Administered Date(s) Administered   Fluad Quad(high Dose 65+) 01/29/2019, 01/30/2020, 02/17/2021, 04/27/2022   INFLUENZA, HIGH DOSE SEASONAL PF 01/18/2014, 02/01/2018   Influenza,inj,Quad PF,6+ Mos 02/06/2013   Influenza-Unspecified 01/18/2014   Pneumococcal Conjugate-13 01/02/2014   Pneumococcal Polysaccharide-23 02/01/2018   Tdap 04/13/2011        Objective:     BP 104/64   Pulse 83   Temp 98.1 F (36.7 C) (Temporal)   Ht 5' 4 (1.626 m)   Wt 205 lb 12.8 oz (93.4 kg)   SpO2 97%   BMI 35.33 kg/m   SpO2: 97 %  GENERAL: Obese woman, no acute distress, fully ambulatory, no conversational dyspnea. HEAD:  Normocephalic, atraumatic.  EYES: Pupils equal, round, reactive to light.  No scleral icterus.  MOUTH: Poor dentition, oral mucosa moist.  No thrush. NECK: Supple. No thyromegaly. Trachea midline. No JVD.  No adenopathy. PULMONARY: Good air entry bilaterally.  Coarse, otherwise, rhonchi noted most pronounced on the right lower lung field. CARDIOVASCULAR: S1 and S2. Regular rate and rhythm.  No rubs, murmurs or gallops heard. ABDOMEN: Obese, otherwise benign. MUSCULOSKELETAL: No joint deformity, no clubbing, no edema.  NEUROLOGIC: No overt focal deficit, no gait disturbance, speech is fluent. SKIN: Intact,warm,dry. PSYCH: Mood and behavior normal.        Assessment & Plan:     ICD-10-CM   1. Stage 1 mild COPD by GOLD classification (HCC)  J44.9     2. Hiatal hernia with gastroesophageal reflux  K44.9    K21.9      Discussion:    Mild stage 1 chronic obstructive pulmonary disease (  COPD) Mild stage 1 COPD, well-managed with current treatment. No significant dyspnea or frequent use of albuterol . Cough improved, occasional throat clearing and voice changes possibly due to sinus drainage or bronchitis. Previous chest x-ray normal. Smoking cessation 12-13 years ago. - Continue Symbicort  as prescribed. - Use albuterol  as needed for dyspnea. - Rinse mouth after using Symbicort , consider adding baking soda to rinse water  to neutralize effects. - Will schedule follow-up in 6 months unless symptoms worsen.  Overweight Status contributing to decreased lung capacity. Weight loss efforts through [employer] program have improved mobility and lung function. - Continue participation in [employer] program for weight management.      Advised if symptoms do not improve or worsen, to please contact office for sooner follow up or seek emergency care.    I spent 30 minutes of dedicated to the care of this patient on the date of this encounter to include pre-visit review of records,  face-to-face time with the patient discussing conditions above, post visit ordering of testing, clinical documentation with the electronic health record, making appropriate referrals as documented, and communicating necessary findings to members of the patients care team.     C. Leita Sanders, MD [facility]  [facility]    *This note was generated using voice recognition software/Dragon and/or AI transcription program.  Despite best efforts to proofread, errors can occur which can change the meaning. Any transcriptional errors that result from this process are unintentional and may not be fully corrected at the time of dictation.

## 2024-02-28 NOTE — Patient Instructions (Signed)
 Full PFT completed today ? ?

## 2024-02-29 ENCOUNTER — Ambulatory Visit: Payer: Self-pay | Admitting: Pulmonary Disease

## 2024-04-16 ENCOUNTER — Telehealth: Payer: Self-pay

## 2024-04-16 NOTE — Telephone Encounter (Signed)
 Spoke with patient scheduled an appointment.

## 2024-04-16 NOTE — Telephone Encounter (Signed)
 Copied from CRM [id number]. Topic: Clinical - Medical Advice >> Apr 13, 2024  4:57 PM Winona R wrote: Pt states she spoke with Dr. MARLA about weight loss injections and would like to know if he can sent that to the pharmacy or if he would prefer to see her in office again before prescribing anything. Pt would like to start with Monjouro and does not want anything that is affiliated with a metformin    [facility] [id number], [place] - [address] [place] [address] Phone: [phone]

## 2024-04-27 ENCOUNTER — Ambulatory Visit (INDEPENDENT_AMBULATORY_CARE_PROVIDER_SITE_OTHER): Admitting: Family Medicine

## 2024-04-27 ENCOUNTER — Encounter: Payer: Self-pay | Admitting: Family Medicine

## 2024-04-27 VITALS — BP 100/58 | HR 69 | Ht 64.0 in | Wt 201.8 lb

## 2024-04-27 DIAGNOSIS — Z23 Encounter for immunization: Secondary | ICD-10-CM | POA: Diagnosis not present

## 2024-04-27 DIAGNOSIS — R7309 Other abnormal glucose: Secondary | ICD-10-CM | POA: Diagnosis not present

## 2024-04-27 DIAGNOSIS — F3342 Major depressive disorder, recurrent, in full remission: Secondary | ICD-10-CM

## 2024-04-27 DIAGNOSIS — E66811 Obesity, class 1: Secondary | ICD-10-CM | POA: Diagnosis not present

## 2024-04-27 DIAGNOSIS — J432 Centrilobular emphysema: Secondary | ICD-10-CM | POA: Diagnosis not present

## 2024-04-27 MED ORDER — WEGOVY 1.5 MG PO TABS: 1.5000 mg | ORAL_TABLET | Freq: Every day | ORAL | 0 refills | Status: DC

## 2024-04-27 NOTE — Progress Notes (Unsigned)
 "  Subjective:    Patient ID: Kelly Weber, female    DOB: [birth year], 79 y.o.   MRN: [id number]  Kelly Weber is a 79 y.o. female presenting on 04/27/2024 for Injections (Patient wants to discuss [facility])   HPI  Discussed the use of AI scribe software for clinical note transcription with the patient, who gave verbal consent to proceed.  History of Present Illness   Kelly Weber is a 79 year old female who presents with difficulty losing weight despite lifestyle changes.  Obesity BMI >34 Weight management difficulty - Difficulty losing weight despite lifestyle changes including joining [employer] in October and a fitness center last week. - Initial weight loss of 15 pounds, from 217 to 200 pounds by Christmas. - Current weight plateau at 201 pounds. - Frustration with lack of further weight loss. - Seeking additional options for weight management.  Centrilobular Emphysema Improved with current management Symbicort , and Albuterol  PRN - Uses breathing treatments in the morning and at night. - Rarely requires emergency breathing treatments during the day. - Able to walk without experiencing shortness of breath.  Elevated A1c Last A1c 5.6 in pre diabetic range Not on therapy  Major Depression Recurrent in remission History of mood / depression, has improved Not on therapy currently.   Health Maintenance: Due Flu Shot today      04/27/2024    3:29 PM 01/26/2024   12:06 AM 06/24/2023    3:25 PM  Depression screen PHQ 2/9  Decreased Interest 0 0 0  Down, Depressed, Hopeless 0 0 0  PHQ - 2 Score 0 0 0  Altered sleeping 0 0 0  Tired, decreased energy 0 1 0  Change in appetite 1 0 0  Feeling bad or failure about yourself  0 0 0  Trouble concentrating 0 0 0  Moving slowly or fidgety/restless 0 0 0  Suicidal thoughts 0 0 0  PHQ-9 Score 1 1  0   Difficult doing work/chores Not difficult at all Not difficult at all      Data saved with a previous flowsheet row  definition       04/27/2024    3:29 PM 04/26/2023    2:09 PM 01/12/2023    3:32 PM 12/03/2022    1:59 PM  GAD 7 : Generalized Anxiety Score  Nervous, Anxious, on Edge 0 0 1 1  Control/stop worrying 0 1 0 1  Worry too much - different things 0 1 1 2   Trouble relaxing 0 1 1 1   Restless 0 0 1 1  Easily annoyed or irritable 0 1 0 1  Afraid - awful might happen 0 0 0 1  Total GAD 7 Score 0 4 4 8   Anxiety Difficulty Not difficult at all   Somewhat difficult    Social History[1]  Review of Systems Per HPI unless specifically indicated above     Objective:    BP (!) 100/58 (BP Location: Left Arm, Patient Position: Sitting, Cuff Size: Large)   Pulse 69   Ht 5' 4 (1.626 m)   Wt 201 lb 12.8 oz (91.5 kg)   SpO2 93%   BMI 34.64 kg/m   Wt Readings from Last 3 Encounters:  04/27/24 201 lb 12.8 oz (91.5 kg)  02/28/24 205 lb 12.8 oz (93.4 kg)  02/28/24 205 lb 12.8 oz (93.4 kg)    Physical Exam Vitals and nursing note reviewed.  Constitutional:      General: She is not in acute distress.  Appearance: She is well-developed. She is obese. She is not diaphoretic.     Comments: Well-appearing, comfortable, cooperative  HENT:     Head: Normocephalic and atraumatic.  Eyes:     General:        Right eye: No discharge.        Left eye: No discharge.     Conjunctiva/sclera: Conjunctivae normal.  Neck:     Thyroid : No thyromegaly.  Cardiovascular:     Rate and Rhythm: Normal rate and regular rhythm.     Heart sounds: Normal heart sounds. No murmur heard. Pulmonary:     Effort: Pulmonary effort is normal. No respiratory distress.     Breath sounds: Normal breath sounds. No wheezing or rales.  Musculoskeletal:        General: Normal range of motion.     Cervical back: Normal range of motion and neck supple.  Lymphadenopathy:     Cervical: No cervical adenopathy.  Skin:    General: Skin is warm and dry.     Findings: No erythema or rash.  Neurological:     Mental Status: She is  alert and oriented to person, place, and time.  Psychiatric:        Behavior: Behavior normal.     Comments: Well groomed, good eye contact, normal speech and thoughts     Results for orders placed or performed in visit on 02/28/24  Pulmonary function test   Collection Time: 02/28/24 10:48 AM  Result Value Ref Range   FVC-Pre 2.38 L   FVC-%Pred-Pre 88 %   FVC-Post 2.42 L   FVC-%Pred-Post 89 %   FVC-%Change-Post 1 %   FEV1-Pre 1.49 L   FEV1-%Pred-Pre 74 %   FEV1-Post 1.55 L   FEV1-%Pred-Post 77 %   FEV1-%Change-Post 4 %   FEV6-Pre 2.36 L   FEV6-%Pred-Pre 92 %   FEV6-Post 2.35 L   FEV6-%Pred-Post 92 %   FEV6-%Change-Post 0 %   Pre FEV1/FVC ratio 62 %   FEV1FVC-%Pred-Pre 84 %   Post FEV1/FVC ratio 64 %   FEV1FVC-%Change-Post 2 %   Pre FEV6/FVC Ratio 99 %   FEV6FVC-%Pred-Pre 104 %   Post FEV6/FVC ratio 97 %   FEV6FVC-%Pred-Post 102 %   FEV6FVC-%Change-Post -1 %   FEF 25-75 Pre 0.78 L/sec   FEF2575-%Pred-Pre 51 %   FEF 25-75 Post 0.87 L/sec   FEF2575-%Pred-Post 57 %   FEF2575-%Change-Post 11 %   RV 3.85 L   RV % pred 163 %   TLC 6.42 L   TLC % pred 126 %   DLCO unc 13.38 ml/min/mmHg   DLCO unc % pred 71 %   DL/VA 6.70 ml/min/mmHg/L   DL/VA % pred 79 %      Assessment & Plan:   Problem List Items Addressed This Visit     Centrilobular emphysema (HCC)   Elevated hemoglobin A1c   Major depression, recurrent, full remission   Obesity (BMI 30.0-34.9) - Primary   Relevant Medications   WEGOVY  1.5 MG tablet   Other Visit Diagnoses       Flu vaccine need       Relevant Orders   Flu vaccine HIGH DOSE PF(Fluzone Trivalent) (Completed)        Obesity BMI >34 Weight plateau at 201 pounds. No qualifying conditions for insurance coverage of weight loss medications. Discussed Wegovy  options and costs. Emphasized importance of Weight Watchers and exercise. Elevated A1c without pre-diabetes  - Provided sample of weight loss injection for one month  trial. - Ordered  Wegovy  oral medication to start after injection trial. - Instructed to take Wegovy  pill on an empty stomach with water , wait 30 minutes before food, drink, or other medications. - Advised to contact provider to increase Wegovy  dose after one month if satisfied with results. - Continue Weight Watchers and exercise regimen.  Centrilobular emphysema Managed with breathing treatments. Rarely requires emergency treatment. - Continue current breathing treatment regimen.      Major Depression recurrent with full remission Not on therapy Controlled   Orders Placed This Encounter  Procedures   Flu vaccine HIGH DOSE PF(Fluzone Trivalent)    Meds ordered this encounter  Medications   WEGOVY  1.5 MG tablet    Sig: Take 1 tablet (1.5 mg total) by mouth daily. Daily in AM on an empty stomach with 4 oz of water . Do not eat or drink for 30 minutes after dose.    Dispense:  30 tablet    Refill:  0    Follow up plan: Return if symptoms worsen or fail to improve.  Marsa Officer, DO [facility] 04/27/2024, 3:09 PM     [1]  Social History Tobacco Use   Smoking status: Former    Current packs/day: 0.00    Average packs/day: 1 pack/day for 30.0 years (30.0 ttl pk-yrs)    Types: Cigarettes    Start date: 01/20/1984    Quit date: 01/19/2014    Years since quitting: 10.2   Smokeless tobacco: Former  Building Services Engineer status: Never Used  Substance Use Topics   Alcohol use: No    Alcohol/week: 0.0 standard drinks of alcohol   Drug use: No   "

## 2024-04-27 NOTE — Patient Instructions (Addendum)
 Thank you for coming to the office today.  Start Wegovy  injection once a week for 4 weeks then start the pill  Oral Wegovy  Starting dose is 1.5mg  once daily, first thing in morning on empty stomach, sip of water , no other medicines. Wait 30 min before first meal of day.   After 30 days contact me and we can increase the dose each month if you prefer  Keep up the great work!   Please schedule a Follow-up Appointment to: Return if symptoms worsen or fail to improve.  If you have any other questions or concerns, please feel free to call the office or send a message through MyChart. You may also schedule an earlier appointment if necessary.  Additionally, you may be receiving a survey about your experience at our office within a few days to 1 week by e-mail or mail. We value your feedback.  Marsa Officer, DO [facility], [place]

## 2024-04-28 NOTE — Progress Notes (Incomplete)
 "  Subjective:    Patient ID: Kelly Weber, female    DOB: 1945-10-14, 79 y.o.   MRN: [id number]  Kelly Weber is a 79 y.o. female presenting on 04/27/2024 for Injections (Patient wants to discuss [facility])   HPI  Discussed the use of AI scribe software for clinical note transcription with the patient, who gave verbal consent to proceed.  History of Present Illness   Kelly Weber is a 79 year old female who presents with difficulty losing weight despite lifestyle changes.  Weight management difficulty - Difficulty losing weight despite lifestyle changes including joining [employer] in October and a fitness center last week. - Initial weight loss of 15 pounds, from 217 to 200 pounds by Christmas. - Current weight plateau at 201 pounds. - Frustration with lack of further weight loss. - Seeking additional options for weight management.  Obesity concerns - Concerned about obesity and its management.  Respiratory status - Uses breathing treatments in the morning and at night. - Rarely requires emergency breathing treatments during the day. - Able to walk without experiencing shortness of breath.  Glycemic status - No diabetes. - Normal blood sugar levels.       ***  Health Maintenance: ***  ***PHQ     04/27/2024    3:29 PM 01/26/2024   12:06 AM 06/24/2023    3:25 PM  Depression screen PHQ 2/9  Decreased Interest 0 0 0  Down, Depressed, Hopeless 0 0 0  PHQ - 2 Score 0 0 0  Altered sleeping 0 0 0  Tired, decreased energy 0 1 0  Change in appetite 1 0 0  Feeling bad or failure about yourself  0 0 0  Trouble concentrating 0 0 0  Moving slowly or fidgety/restless 0 0 0  Suicidal thoughts 0 0 0  PHQ-9 Score 1 1  0   Difficult doing work/chores Not difficult at all Not difficult at all      Data saved with a previous flowsheet row definition       04/27/2024    3:29 PM 04/26/2023    2:09 PM 01/12/2023    3:32 PM 12/03/2022    1:59 PM  GAD 7 : Generalized  Anxiety Score  Nervous, Anxious, on Edge 0 0 1 1  Control/stop worrying 0 1 0 1  Worry too much - different things 0 1 1 2   Trouble relaxing 0 1 1 1   Restless 0 0 1 1  Easily annoyed or irritable 0 1 0 1  Afraid - awful might happen 0 0 0 1  Total GAD 7 Score 0 4 4 8   Anxiety Difficulty Not difficult at all   Somewhat difficult    Social History[1]  Review of Systems Per HPI unless specifically indicated above     Objective:    BP (!) 100/58 (BP Location: Left Arm, Patient Position: Sitting, Cuff Size: Large)   Pulse 69   Ht 5' 4 (1.626 m)   Wt 201 lb 12.8 oz (91.5 kg)   SpO2 93%   BMI 34.64 kg/m   Wt Readings from Last 3 Encounters:  04/27/24 201 lb 12.8 oz (91.5 kg)  02/28/24 205 lb 12.8 oz (93.4 kg)  02/28/24 205 lb 12.8 oz (93.4 kg)    Physical Exam Vitals and nursing note reviewed.  Constitutional:      General: She is not in acute distress.    Appearance: She is well-developed. She is obese. She is not diaphoretic.  Comments: Well-appearing, comfortable, cooperative  HENT:     Head: Normocephalic and atraumatic.  Eyes:     General:        Right eye: No discharge.        Left eye: No discharge.     Conjunctiva/sclera: Conjunctivae normal.  Neck:     Thyroid : No thyromegaly.  Cardiovascular:     Rate and Rhythm: Normal rate and regular rhythm.     Heart sounds: Normal heart sounds. No murmur heard. Pulmonary:     Effort: Pulmonary effort is normal. No respiratory distress.     Breath sounds: Normal breath sounds. No wheezing or rales.  Musculoskeletal:        General: Normal range of motion.     Cervical back: Normal range of motion and neck supple.  Lymphadenopathy:     Cervical: No cervical adenopathy.  Skin:    General: Skin is warm and dry.     Findings: No erythema or rash.  Neurological:     Mental Status: She is alert and oriented to person, place, and time.  Psychiatric:        Behavior: Behavior normal.     Comments: Well groomed, good  eye contact, normal speech and thoughts     Results for orders placed or performed in visit on 02/28/24  Pulmonary function test   Collection Time: 02/28/24 10:48 AM  Result Value Ref Range   FVC-Pre 2.38 L   FVC-%Pred-Pre 88 %   FVC-Post 2.42 L   FVC-%Pred-Post 89 %   FVC-%Change-Post 1 %   FEV1-Pre 1.49 L   FEV1-%Pred-Pre 74 %   FEV1-Post 1.55 L   FEV1-%Pred-Post 77 %   FEV1-%Change-Post 4 %   FEV6-Pre 2.36 L   FEV6-%Pred-Pre 92 %   FEV6-Post 2.35 L   FEV6-%Pred-Post 92 %   FEV6-%Change-Post 0 %   Pre FEV1/FVC ratio 62 %   FEV1FVC-%Pred-Pre 84 %   Post FEV1/FVC ratio 64 %   FEV1FVC-%Change-Post 2 %   Pre FEV6/FVC Ratio 99 %   FEV6FVC-%Pred-Pre 104 %   Post FEV6/FVC ratio 97 %   FEV6FVC-%Pred-Post 102 %   FEV6FVC-%Change-Post -1 %   FEF 25-75 Pre 0.78 L/sec   FEF2575-%Pred-Pre 51 %   FEF 25-75 Post 0.87 L/sec   FEF2575-%Pred-Post 57 %   FEF2575-%Change-Post 11 %   RV 3.85 L   RV % pred 163 %   TLC 6.42 L   TLC % pred 126 %   DLCO unc 13.38 ml/min/mmHg   DLCO unc % pred 71 %   DL/VA 6.70 ml/min/mmHg/L   DL/VA % pred 79 %      Assessment & Plan:   Problem List Items Addressed This Visit     Centrilobular emphysema (HCC)   Elevated hemoglobin A1c   Major depression, recurrent, full remission   Obesity (BMI 30.0-34.9) - Primary   Relevant Medications   WEGOVY  1.5 MG tablet   Other Visit Diagnoses       Flu vaccine need       Relevant Orders   Flu vaccine HIGH DOSE PF(Fluzone Trivalent) (Completed)        Obesity BMI >34 Weight plateau at 201 pounds. No qualifying conditions for insurance coverage of weight loss medications. Discussed Wegovy  options and costs. Emphasized importance of Weight Watchers and exercise. Elevated A1c without pre-diabetes  - Provided sample of weight loss injection for one month trial. - Ordered Wegovy  oral medication to start after injection trial. - Instructed to take  Wegovy  pill on an empty stomach with water , wait 30  minutes before food, drink, or other medications. - Advised to contact provider to increase Wegovy  dose after one month if satisfied with results. - Continue Weight Watchers and exercise regimen.  Centrilobular emphysema Managed with breathing treatments. Rarely requires emergency treatment. - Continue current breathing treatment regimen.      Major Depression recurrent with full remission   Orders Placed This Encounter  Procedures   Flu vaccine HIGH DOSE PF(Fluzone Trivalent)    Meds ordered this encounter  Medications   WEGOVY  1.5 MG tablet    Sig: Take 1 tablet (1.5 mg total) by mouth daily. Daily in AM on an empty stomach with 4 oz of water . Do not eat or drink for 30 minutes after dose.    Dispense:  30 tablet    Refill:  0    Follow up plan: Return if symptoms worsen or fail to improve.  Marsa Officer, DO [facility] 04/27/2024, 3:09 PM       [1] Social History Tobacco Use   Smoking status: Former    Current packs/day: 0.00    Average packs/day: 1 pack/day for 30.0 years (30.0 ttl pk-yrs)    Types: Cigarettes    Start date: 01/20/1984    Quit date: 01/19/2014    Years since quitting: 10.2   Smokeless tobacco: Former  Building Services Engineer status: Never Used  Substance Use Topics   Alcohol use: No    Alcohol/week: 0.0 standard drinks of alcohol   Drug use: No  "

## 2024-04-30 ENCOUNTER — Telehealth: Payer: Self-pay

## 2024-04-30 NOTE — Addendum Note (Signed)
 Addended by: EDMAN MARSA PARAS on: 04/30/2024 05:21 PM   Modules accepted: Orders

## 2024-04-30 NOTE — Telephone Encounter (Signed)
 Patient has free sample Wegovy  0.25mg  weekly x 4 weeks.  She was given sample on 04/27/24  She was instructed to call back when on last dose in about 3 weeks and we can send rx for Wegovy  injection 0.5mg  weekly, and she can TEXT SAVE to [phone] for discounts. She says husband will pay for injections, declines the oral wegovy  pill  I asked her to call back so we can order Wegovy  0.5mg  weekly inj dose in 3 weeks  Marsa Officer, DO [facility] 04/30/2024, 5:21 PM

## 2024-04-30 NOTE — Telephone Encounter (Signed)
 Copied from CRM #[id number]. Topic: Clinical - Medication Question >> Apr 30, 2024  8:06 AM Kelly Weber wrote: Reason for CRM: Pt reports that she wants to receive Wegovy  injections for her weight loss instead of the pills. Please advise, says she discussed this with her PCP and has now finally agreed.   Pt is requesting a call back from the clinic to discuss Best contact: [phone]

## 2024-05-08 ENCOUNTER — Telehealth: Payer: Self-pay | Admitting: Pharmacist

## 2024-05-08 ENCOUNTER — Other Ambulatory Visit (HOSPITAL_COMMUNITY): Payer: Self-pay

## 2024-05-08 NOTE — Telephone Encounter (Signed)
 Pharmacy Patient Advocate Encounter   Received notification from CoverMyMeds that prior authorization for Wegovy  1.5 mg Tablets is due for renewal.   Per chart, discontinued and injectable wegovy  has been prescribed. (Key [id number])

## 2024-05-18 ENCOUNTER — Telehealth: Payer: Self-pay

## 2024-05-18 DIAGNOSIS — E66811 Obesity, class 1: Secondary | ICD-10-CM

## 2024-05-18 MED ORDER — WEGOVY 0.5 MG/0.5ML ~~LOC~~ SOAJ: 0.5000 mg | SUBCUTANEOUS | 2 refills | Status: AC

## 2024-05-18 NOTE — Telephone Encounter (Signed)
 Copied from CRM #[id number]. Topic: General - Other >> May 18, 2024  8:51 AM Myrick T wrote: Reason for CRM: patient stated Dr MARLA wanted her to call to let her know the Wegovy  was working for her and to call her back. Please f/u with patient

## 2024-05-18 NOTE — Addendum Note (Signed)
 Addended by: EDMAN MARSA PARAS on: 05/18/2024 05:20 PM   Modules accepted: Orders

## 2024-05-18 NOTE — Telephone Encounter (Signed)
 Rx Wegovy  inj 0.5mg  weekly sent to pharmacy  Marsa Officer, DO [facility] 05/18/2024, 5:20 PM

## 2024-05-29 ENCOUNTER — Ambulatory Visit: Admitting: Dermatology

## 2024-06-29 ENCOUNTER — Ambulatory Visit

## 2024-07-04 ENCOUNTER — Ambulatory Visit

## 2024-07-25 ENCOUNTER — Ambulatory Visit: Admitting: Family Medicine

## 2024-10-19 ENCOUNTER — Ambulatory Visit

## 2024-11-06 ENCOUNTER — Encounter: Admitting: Dermatology
